# Patient Record
Sex: Male | Born: 1935 | Race: White | Hispanic: No | Marital: Married | Smoking: Former smoker
Health system: Southern US, Community
[De-identification: ages and names within clinical notes are randomized; demographics above are authoritative.]

## PROBLEM LIST (undated history)

## (undated) DIAGNOSIS — I2510 Atherosclerotic heart disease of native coronary artery without angina pectoris: Secondary | ICD-10-CM

## (undated) DIAGNOSIS — Z9889 Other specified postprocedural states: Secondary | ICD-10-CM

## (undated) DIAGNOSIS — I10 Essential (primary) hypertension: Secondary | ICD-10-CM

## (undated) DIAGNOSIS — D869 Sarcoidosis, unspecified: Secondary | ICD-10-CM

## (undated) DIAGNOSIS — G8929 Other chronic pain: Secondary | ICD-10-CM

## (undated) DIAGNOSIS — C801 Malignant (primary) neoplasm, unspecified: Secondary | ICD-10-CM

## (undated) DIAGNOSIS — F0390 Unspecified dementia without behavioral disturbance: Secondary | ICD-10-CM

## (undated) DIAGNOSIS — F29 Unspecified psychosis not due to a substance or known physiological condition: Secondary | ICD-10-CM

## (undated) DIAGNOSIS — M21379 Foot drop, unspecified foot: Secondary | ICD-10-CM

## (undated) DIAGNOSIS — G4730 Sleep apnea, unspecified: Secondary | ICD-10-CM

## (undated) DIAGNOSIS — K219 Gastro-esophageal reflux disease without esophagitis: Secondary | ICD-10-CM

## (undated) DIAGNOSIS — R112 Nausea with vomiting, unspecified: Secondary | ICD-10-CM

## (undated) DIAGNOSIS — E119 Type 2 diabetes mellitus without complications: Secondary | ICD-10-CM

## (undated) DIAGNOSIS — F429 Obsessive-compulsive disorder, unspecified: Secondary | ICD-10-CM

## (undated) DIAGNOSIS — E785 Hyperlipidemia, unspecified: Secondary | ICD-10-CM

## (undated) HISTORY — DX: Sarcoidosis, unspecified: D86.9

## (undated) HISTORY — PX: GALLBLADDER SURGERY: SHX652

## (undated) HISTORY — DX: Foot drop, unspecified foot: M21.379

## (undated) HISTORY — DX: Hyperlipidemia, unspecified: E78.5

## (undated) HISTORY — DX: Gastro-esophageal reflux disease without esophagitis: K21.9

## (undated) HISTORY — DX: Unspecified dementia, unspecified severity, without behavioral disturbance, psychotic disturbance, mood disturbance, and anxiety: F03.90

## (undated) HISTORY — PX: BACK SURGERY: SHX140

## (undated) HISTORY — DX: Unspecified psychosis not due to a substance or known physiological condition: F29

## (undated) HISTORY — DX: Obsessive-compulsive disorder, unspecified: F42.9

## (undated) HISTORY — DX: Essential (primary) hypertension: I10

## (undated) HISTORY — DX: Other chronic pain: G89.29

## (undated) HISTORY — PX: CHOLECYSTECTOMY: SHX55

## (undated) HISTORY — PX: MELANOMA EXCISION: SHX5266

## (undated) HISTORY — PX: OTHER SURGICAL HISTORY: SHX169

## (undated) HISTORY — PX: TONSILLECTOMY: SUR1361

## (undated) NOTE — *Deleted (*Deleted)
Eric Bowman    [id number]    1933-06-10  Primary Care Physician:Green, Chong Sicilian, NP  Ov 08/20/20 Dr Wynona Neat:    Chief complaint:   Patient being seen for recent chest x-Kreig findings showing large pleural effusion  HPI:  Patient with pleural effusion that required thoracentesis had over 1200 cc of amber-colored fluid drained -Fluid shows mesothelial cells, no with atypia  At the time of the pleural effusion findings, he also did have scrotal edema, lower extremity edema, BNP elevated  Recent swallowing evaluation did reveal silent aspiration  Also prior to being evaluated with the x-Matilde showing pleural effusion he was being treated with antibiotics for possible pneumonia  Multifactorial reasons for the pleural effusion  Patient had a chest x-Draylen today showing stable findings compared with 20 just had his thoracentesis performed about 2 weeks ago  Patient was recently started on apixaban empirically  Had Covid earlier in the year       Assessment:  Decompensated heart failure  Pleural effusion likely related to heart failure, may also be secondary to recent infection  Atelectasis/infection likely related to recurrent aspiration  Aspiration pneumonia  Severe deconditioning    Plan/Recommendations: Optimize diuresis  Continue treatment for obstructive sleep apnea  Regarding anticoagulation, unless there is ongoing risk may consider stopping anticoagulation depending on further evaluation which may include checking the lower extremity for clots, consideration for CT angio.  May need repeated thoracentesis  Aspiration precautions is essential to guard against recurrent lower extremity infections  Follow-up in about 6 weeks      10/01/2020  f/u ov/[name] re:  Decompensated heart failure Pleural effusion likely related to heart failure, may also be secondary to recent infection Atelectasis/infection likely related to recurrent  aspiration Aspiration pneumonia Severe deconditioning  No chief complaint on file.    Dyspnea:  *** Cough: *** Sleeping: *** SABA use: *** 02: ***   No obvious day to day or daytime variability or assoc excess/ purulent sputum or mucus plugs or hemoptysis or cp or chest tightness, subjective wheeze or overt sinus or hb symptoms.   *** without nocturnal  or early am exacerbation  of respiratory  c/o's or need for noct saba. Also denies any obvious fluctuation of symptoms with weather or environmental changes or other aggravating or alleviating factors except as outlined above   No unusual exposure hx or h/o childhood pna/ asthma or knowledge of premature birth.  Current Allergies, Complete Past Medical History, Past Surgical History, Family History, and Social History were reviewed in [employer] record.  ROS  The following are not active complaints unless bolded Hoarseness, sore throat, dysphagia, dental problems, itching, sneezing,  nasal congestion or discharge of excess mucus or purulent secretions, ear ache,   fever, chills, sweats, unintended wt loss or wt gain, classically pleuritic or exertional cp,  orthopnea pnd or arm/hand swelling  or leg swelling, presyncope, palpitations, abdominal pain, anorexia, nausea, vomiting, diarrhea  or change in bowel habits or change in bladder habits, change in stools or change in urine, dysuria, hematuria,  rash, arthralgias, visual complaints, headache, numbness, weakness or ataxia or problems with walking or coordination,  change in mood or  memory.        No outpatient medications have been marked as taking for the 10/01/20 encounter (Appointment) with [name], MD.          Past Surgical History:  Procedure Laterality Date  . BACK SURGERY  cervical neck  . CATARACT EXTRACTION W/PHACO  01/05/2012   Procedure: CATARACT EXTRACTION PHACO AND INTRAOCULAR LENS PLACEMENT (IOC);  Surgeon: Gemma Payor, MD;  Location:  [facility];  Service: Ophthalmology;  Laterality: Left;  CDE 18.47  . CATARACT EXTRACTION W/PHACO  02/02/2012   Procedure: CATARACT EXTRACTION PHACO AND INTRAOCULAR LENS PLACEMENT (IOC);  Surgeon: Gemma Payor, MD;  Location: [facility];  Service: Ophthalmology;  Laterality: Right;  CDE:15.38  . CHOLECYSTECTOMY    . hemorhoidectomy    . MELANOMA EXCISION     left side-[name]  . TONSILLECTOMY      Past Medical History:  Diagnosis Date  . Cancer (HCC)    melanoma-left side  . Chronic pain    legs and feet  . Coronary artery disease   . Dementia (HCC)   . Diabetes mellitus without complication (HCC)   . Foot drop   . GERD (gastroesophageal reflux disease)   . Gout   . Hyperlipidemia   . Hypertension   . OCD (obsessive compulsive disorder)   . PONV (postoperative nausea and vomiting)   . Psychosis (HCC)   . Sarcoidosis   . Sleep apnea    cpap

---

## 1998-07-24 ENCOUNTER — Ambulatory Visit (HOSPITAL_COMMUNITY): Admission: RE | Admit: 1998-07-24 | Discharge: 1998-07-24 | Payer: Self-pay | Admitting: *Deleted

## 1998-12-02 ENCOUNTER — Encounter: Payer: Self-pay | Admitting: Neurosurgery

## 1998-12-02 ENCOUNTER — Inpatient Hospital Stay (HOSPITAL_COMMUNITY): Admission: RE | Admit: 1998-12-02 | Discharge: 1998-12-04 | Payer: Self-pay | Admitting: Neurosurgery

## 2005-05-26 ENCOUNTER — Encounter (HOSPITAL_COMMUNITY): Admission: RE | Admit: 2005-05-26 | Discharge: 2005-05-27 | Payer: Self-pay | Admitting: *Deleted

## 2007-04-09 ENCOUNTER — Encounter (INDEPENDENT_AMBULATORY_CARE_PROVIDER_SITE_OTHER): Payer: Self-pay | Admitting: Internal Medicine

## 2007-05-02 ENCOUNTER — Ambulatory Visit: Payer: Self-pay | Admitting: Internal Medicine

## 2007-05-02 ENCOUNTER — Ambulatory Visit (HOSPITAL_COMMUNITY): Admission: RE | Admit: 2007-05-02 | Discharge: 2007-05-02 | Payer: Self-pay | Admitting: Internal Medicine

## 2007-05-02 DIAGNOSIS — I10 Essential (primary) hypertension: Secondary | ICD-10-CM

## 2007-05-02 DIAGNOSIS — K219 Gastro-esophageal reflux disease without esophagitis: Secondary | ICD-10-CM

## 2007-05-02 DIAGNOSIS — E785 Hyperlipidemia, unspecified: Secondary | ICD-10-CM | POA: Insufficient documentation

## 2007-05-02 DIAGNOSIS — K589 Irritable bowel syndrome without diarrhea: Secondary | ICD-10-CM

## 2007-05-02 DIAGNOSIS — R0602 Shortness of breath: Secondary | ICD-10-CM

## 2007-05-02 DIAGNOSIS — D869 Sarcoidosis, unspecified: Secondary | ICD-10-CM

## 2007-05-02 DIAGNOSIS — J42 Unspecified chronic bronchitis: Secondary | ICD-10-CM

## 2007-05-16 ENCOUNTER — Ambulatory Visit: Payer: Self-pay | Admitting: Internal Medicine

## 2007-05-16 DIAGNOSIS — J310 Chronic rhinitis: Secondary | ICD-10-CM

## 2007-05-23 ENCOUNTER — Telehealth (INDEPENDENT_AMBULATORY_CARE_PROVIDER_SITE_OTHER): Payer: Self-pay | Admitting: Internal Medicine

## 2007-05-30 ENCOUNTER — Encounter (INDEPENDENT_AMBULATORY_CARE_PROVIDER_SITE_OTHER): Payer: Self-pay | Admitting: Internal Medicine

## 2007-06-01 ENCOUNTER — Encounter (INDEPENDENT_AMBULATORY_CARE_PROVIDER_SITE_OTHER): Payer: Self-pay | Admitting: Internal Medicine

## 2007-06-06 ENCOUNTER — Encounter (INDEPENDENT_AMBULATORY_CARE_PROVIDER_SITE_OTHER): Payer: Self-pay | Admitting: Internal Medicine

## 2007-06-07 ENCOUNTER — Encounter (INDEPENDENT_AMBULATORY_CARE_PROVIDER_SITE_OTHER): Payer: Self-pay | Admitting: Internal Medicine

## 2007-06-11 ENCOUNTER — Encounter (INDEPENDENT_AMBULATORY_CARE_PROVIDER_SITE_OTHER): Payer: Self-pay | Admitting: Internal Medicine

## 2007-06-25 ENCOUNTER — Encounter (INDEPENDENT_AMBULATORY_CARE_PROVIDER_SITE_OTHER): Payer: Self-pay | Admitting: Internal Medicine

## 2007-06-25 ENCOUNTER — Ambulatory Visit: Admission: RE | Admit: 2007-06-25 | Discharge: 2007-06-25 | Payer: Self-pay | Admitting: Internal Medicine

## 2007-06-30 ENCOUNTER — Ambulatory Visit: Payer: Self-pay | Admitting: Pulmonary Disease

## 2007-07-23 ENCOUNTER — Ambulatory Visit: Admission: RE | Admit: 2007-07-23 | Discharge: 2007-07-23 | Payer: Self-pay | Admitting: Internal Medicine

## 2007-07-23 ENCOUNTER — Encounter (INDEPENDENT_AMBULATORY_CARE_PROVIDER_SITE_OTHER): Payer: Self-pay | Admitting: Internal Medicine

## 2007-07-30 ENCOUNTER — Telehealth (INDEPENDENT_AMBULATORY_CARE_PROVIDER_SITE_OTHER): Payer: Self-pay | Admitting: *Deleted

## 2007-07-31 ENCOUNTER — Ambulatory Visit: Payer: Self-pay | Admitting: Pulmonary Disease

## 2007-08-08 ENCOUNTER — Encounter (INDEPENDENT_AMBULATORY_CARE_PROVIDER_SITE_OTHER): Payer: Self-pay | Admitting: Internal Medicine

## 2007-08-08 ENCOUNTER — Telehealth (INDEPENDENT_AMBULATORY_CARE_PROVIDER_SITE_OTHER): Payer: Self-pay | Admitting: Internal Medicine

## 2007-08-22 ENCOUNTER — Encounter (INDEPENDENT_AMBULATORY_CARE_PROVIDER_SITE_OTHER): Payer: Self-pay | Admitting: Internal Medicine

## 2007-08-23 ENCOUNTER — Telehealth (INDEPENDENT_AMBULATORY_CARE_PROVIDER_SITE_OTHER): Payer: Self-pay | Admitting: Internal Medicine

## 2007-08-24 ENCOUNTER — Encounter (INDEPENDENT_AMBULATORY_CARE_PROVIDER_SITE_OTHER): Payer: Self-pay | Admitting: Internal Medicine

## 2007-08-31 ENCOUNTER — Telehealth (INDEPENDENT_AMBULATORY_CARE_PROVIDER_SITE_OTHER): Payer: Self-pay | Admitting: *Deleted

## 2007-09-10 ENCOUNTER — Ambulatory Visit: Payer: Self-pay | Admitting: Internal Medicine

## 2007-09-10 DIAGNOSIS — G4733 Obstructive sleep apnea (adult) (pediatric): Secondary | ICD-10-CM | POA: Insufficient documentation

## 2007-10-10 ENCOUNTER — Ambulatory Visit: Payer: Self-pay | Admitting: Internal Medicine

## 2007-10-11 ENCOUNTER — Telehealth (INDEPENDENT_AMBULATORY_CARE_PROVIDER_SITE_OTHER): Payer: Self-pay | Admitting: *Deleted

## 2007-10-11 LAB — CONVERTED CEMR LAB
ALT: 18 units/L (ref 0–53)
Albumin: 4 g/dL (ref 3.5–5.2)
Basophils Absolute: 0.1 10*3/uL (ref 0.0–0.1)
Calcium: 8.8 mg/dL (ref 8.4–10.5)
Chloride: 105 meq/L (ref 96–112)
Cholesterol: 126 mg/dL (ref 0–200)
Creatinine, Ser: 0.81 mg/dL (ref 0.40–1.50)
Glucose, Bld: 102 mg/dL — ABNORMAL HIGH (ref 70–99)
HDL: 54 mg/dL (ref >39)
Hemoglobin: 12.8 g/dL — ABNORMAL LOW (ref 13.0–17.0)
LDL Cholesterol: 60 mg/dL (ref 0–99)
Lymphocytes Relative: 15 % (ref 12–46)
Lymphs Abs: 1.2 10*3/uL (ref 0.7–4.0)
Platelets: 260 10*3/uL (ref 150–400)
Potassium: 3.8 meq/L (ref 3.5–5.3)
Sodium: 140 meq/L (ref 135–145)
Total Bilirubin: 0.5 mg/dL (ref 0.3–1.2)
Total Protein: 6.2 g/dL (ref 6.0–8.3)
Triglycerides: 60 mg/dL (ref <150)
WBC: 7.8 10*3/uL (ref 4.0–10.5)

## 2007-11-14 ENCOUNTER — Ambulatory Visit: Payer: Self-pay | Admitting: Internal Medicine

## 2007-11-21 ENCOUNTER — Encounter (INDEPENDENT_AMBULATORY_CARE_PROVIDER_SITE_OTHER): Payer: Self-pay | Admitting: Internal Medicine

## 2007-11-21 ENCOUNTER — Telehealth (INDEPENDENT_AMBULATORY_CARE_PROVIDER_SITE_OTHER): Payer: Self-pay | Admitting: *Deleted

## 2007-12-05 ENCOUNTER — Ambulatory Visit: Payer: Self-pay | Admitting: Internal Medicine

## 2007-12-06 ENCOUNTER — Encounter: Payer: Self-pay | Admitting: Cardiology

## 2007-12-06 ENCOUNTER — Telehealth (INDEPENDENT_AMBULATORY_CARE_PROVIDER_SITE_OTHER): Payer: Self-pay | Admitting: Internal Medicine

## 2008-01-14 ENCOUNTER — Ambulatory Visit: Payer: Self-pay | Admitting: Internal Medicine

## 2008-01-14 ENCOUNTER — Encounter: Payer: Self-pay | Admitting: Internal Medicine

## 2008-03-26 ENCOUNTER — Ambulatory Visit: Payer: Self-pay | Admitting: Internal Medicine

## 2008-03-28 LAB — CONVERTED CEMR LAB
BUN: 14 mg/dL (ref 6–23)
Chloride: 105 meq/L (ref 96–112)
Glucose, Bld: 104 mg/dL — ABNORMAL HIGH (ref 70–99)

## 2008-05-07 ENCOUNTER — Encounter (INDEPENDENT_AMBULATORY_CARE_PROVIDER_SITE_OTHER): Payer: Self-pay | Admitting: Internal Medicine

## 2008-05-23 ENCOUNTER — Encounter (INDEPENDENT_AMBULATORY_CARE_PROVIDER_SITE_OTHER): Payer: Self-pay | Admitting: Internal Medicine

## 2008-06-04 ENCOUNTER — Encounter (INDEPENDENT_AMBULATORY_CARE_PROVIDER_SITE_OTHER): Payer: Self-pay | Admitting: Internal Medicine

## 2008-09-01 ENCOUNTER — Ambulatory Visit: Payer: Self-pay | Admitting: Internal Medicine

## 2008-09-02 ENCOUNTER — Encounter (INDEPENDENT_AMBULATORY_CARE_PROVIDER_SITE_OTHER): Payer: Self-pay | Admitting: Internal Medicine

## 2008-09-04 ENCOUNTER — Encounter (INDEPENDENT_AMBULATORY_CARE_PROVIDER_SITE_OTHER): Payer: Self-pay | Admitting: Internal Medicine

## 2008-09-04 DIAGNOSIS — R7309 Other abnormal glucose: Secondary | ICD-10-CM

## 2008-09-04 DIAGNOSIS — D72829 Elevated white blood cell count, unspecified: Secondary | ICD-10-CM | POA: Insufficient documentation

## 2008-09-04 LAB — CONVERTED CEMR LAB
ALT: 18 units/L (ref 0–53)
BUN: 12 mg/dL (ref 6–23)
Basophils Absolute: 0.1 10*3/uL (ref 0.0–0.1)
CO2: 23 meq/L (ref 19–32)
Chloride: 101 meq/L (ref 96–112)
Eosinophils Relative: 11 % — ABNORMAL HIGH (ref 0–5)
Lymphs Abs: 2 10*3/uL (ref 0.7–4.0)
MCHC: 32.6 g/dL (ref 30.0–36.0)
MCV: 90.5 fL (ref 78.0–100.0)
Monocytes Absolute: 0.3 10*3/uL (ref 0.1–1.0)
Monocytes Relative: 5 % (ref 3–12)
Neutro Abs: 3.5 10*3/uL (ref 1.7–7.7)
Neutrophils Relative %: 53 % (ref 43–77)
Platelets: 250 10*3/uL (ref 150–400)
Potassium: 3.5 meq/L (ref 3.5–5.3)
RDW: 13.6 % (ref 11.5–15.5)
Sodium: 139 meq/L (ref 135–145)

## 2008-09-17 ENCOUNTER — Telehealth (INDEPENDENT_AMBULATORY_CARE_PROVIDER_SITE_OTHER): Payer: Self-pay | Admitting: Internal Medicine

## 2008-10-03 ENCOUNTER — Encounter (INDEPENDENT_AMBULATORY_CARE_PROVIDER_SITE_OTHER): Payer: Self-pay | Admitting: Internal Medicine

## 2008-10-08 DIAGNOSIS — D721 Eosinophilia: Secondary | ICD-10-CM

## 2008-10-08 LAB — CONVERTED CEMR LAB
Basophils Absolute: 0.2 10*3/uL — ABNORMAL HIGH (ref 0.0–0.1)
Basophils Relative: 2 % — ABNORMAL HIGH (ref 0–1)
Glucose, Bld: 88 mg/dL (ref 70–99)
HCT: 45.1 % (ref 39.0–52.0)
Hemoglobin: 14.7 g/dL (ref 13.0–17.0)
Lymphocytes Relative: 32 % (ref 12–46)
Lymphs Abs: 2.2 10*3/uL (ref 0.7–4.0)
MCV: 89.7 fL (ref 78.0–100.0)
Monocytes Relative: 8 % (ref 3–12)
Neutrophils Relative %: 50 % (ref 43–77)
RBC: 5.03 M/uL (ref 4.22–5.81)
RDW: 13.8 % (ref 11.5–15.5)

## 2008-12-23 ENCOUNTER — Encounter (INDEPENDENT_AMBULATORY_CARE_PROVIDER_SITE_OTHER): Payer: Self-pay | Admitting: Internal Medicine

## 2008-12-25 ENCOUNTER — Encounter (INDEPENDENT_AMBULATORY_CARE_PROVIDER_SITE_OTHER): Payer: Self-pay | Admitting: Internal Medicine

## 2009-03-06 ENCOUNTER — Encounter (INDEPENDENT_AMBULATORY_CARE_PROVIDER_SITE_OTHER): Payer: Self-pay | Admitting: Internal Medicine

## 2010-06-29 ENCOUNTER — Ambulatory Visit (HOSPITAL_COMMUNITY): Admission: RE | Admit: 2010-06-29 | Discharge: 2010-06-29 | Payer: Self-pay | Admitting: Pulmonary Disease

## 2010-08-03 ENCOUNTER — Ambulatory Visit (HOSPITAL_COMMUNITY): Admission: RE | Admit: 2010-08-03 | Discharge: 2010-08-03 | Payer: Self-pay | Admitting: Neurology

## 2010-08-12 ENCOUNTER — Encounter (HOSPITAL_COMMUNITY): Admission: RE | Admit: 2010-08-12 | Discharge: 2010-09-11 | Payer: Self-pay | Admitting: Neurology

## 2010-09-13 ENCOUNTER — Encounter (HOSPITAL_COMMUNITY)
Admission: RE | Admit: 2010-09-13 | Discharge: 2010-10-13 | Payer: Self-pay | Source: Home / Self Care | Attending: Neurology | Admitting: Neurology

## 2010-11-21 ENCOUNTER — Encounter: Payer: Self-pay | Admitting: *Deleted

## 2010-11-30 NOTE — Letter (Signed)
Summary: Anders Grant request  [employer] eob request   Imported By: Curtis Sites 03/10/2009 10:05:08  _____________________________________________________________________  External Attachment:    Type:   Image     Comment:   External Document

## 2011-03-15 NOTE — Procedures (Signed)
NAME:  Eric Bowman, Eric Bowman                 ACCOUNT NO.:  [id number]   MEDICAL RECORD NO.:  [id number]          PATIENT TYPE:  OUT   LOCATION:  SLEEP LAB                     FACILITY:  [facility]   PHYSICIAN:  Barbaraann Share, MD,FCCPDATE OF BIRTH:  1933-01-21   DATE OF STUDY:  07/23/2007                            NOCTURNAL POLYSOMNOGRAM   REFERRING PHYSICIAN:   REFERRING PHYSICIAN:  Dr. Erle Crocker.   INDICATION FOR STUDY:  Hypersomnia with sleep apnea.  The patient  returns for pressure optimization.   EPWORTH SCORE:  5.   SLEEP ARCHITECTURE:  The patient had total sleep time of 357 minutes  with decreased slow-wave sleep as well as REM.  Sleep onset latency was  normal, as was REM onset.  Sleep efficiency was fairly good at 90%.   RESPIRATORY DATA:  The patient was fitted with a large ComfortGel  Respironics CPAP mask and CPAP titration was initiated.  At a final  pressure of 6 cm, the patient had excellent control of both his  obstructive events and snoring even through supine REM.  Tolerance was  excellent.   OXYGEN DATA:  There was O2 desaturation as low as 86% prior to pressure  optimization.   CARDIAC DATA:  Occasional PACs were noted along with rare fusion beats.  There were no clinically significant arrhythmias noted.   MOVEMENT-PARASOMNIA:  The patient was found to have 446 leg jerks with  two per hour resulting in arousal or awakening.  These these did improve  somewhat on CPAP but continued to be an issue.   IMPRESSION/RECOMMENDATIONS:  1. Good control of previously-documented obstructive sleep apnea with      6 cm of CPAP pressure delivered by a Respironics large ComfortGel      CPAP mask.  The patient had excellent control even through supine      REM.  2. Very large numbers of leg jerks with what appears to be significant      sleep disruption despite optimal CPAP.  Clinical correlation is      suggested to exclude a concomitant primary movement disorder of  sleep.  If the patient's      clinical history is suggestive of this or if he fails to respond      optimally to CPAP, consideration could be given to a trial of a      dopamine agonist.      Barbaraann Share, MD,FCCP  Diplomate, American Board of Sleep  Medicine  Electronically Signed     [facility]/MEDQ  D:  08/02/2007 06:58:27  T:  08/02/2007 13:38:39  Job:  [id number]

## 2011-03-15 NOTE — Procedures (Signed)
NAME:  Eric Bowman, Eric Bowman                 ACCOUNT NO.:  [id number]   MEDICAL RECORD NO.:  [id number]          PATIENT TYPE:  OUT   LOCATION:  SLEEP LAB                     FACILITY:  [facility]   PHYSICIAN:  Barbaraann Share, MD,FCCPDATE OF BIRTH:  04-Mar-1933   DATE OF STUDY:  06/25/2007                            NOCTURNAL POLYSOMNOGRAM   REFERRING PHYSICIAN:  Erle Crocker, M.D.   INDICATION FOR STUDY:  Hypersomnia with sleep apnea.   EPWORTH SLEEPINESS SCORE:  6.   SLEEP ARCHITECTURE:  The patient had a total sleep time of 293 minutes  with no slow wave sleep and decreased REM.  Sleep onset latency was  normal, and REM onset was normal as well.  Sleep efficiency was  decreased at 76%.   RESPIRATORY DATA:  The patient was found to have 63 hypopneas and 4  apneas for an apnea/hypopnea index of 14 events per hour.  The events  were not positional, but there was mild to moderate snoring noted  throughout.  The patient did not meet split night criteria secondary to  most of the events occurring after 1:00 a.m.   OXYGEN DATA:  The patient had very transient desaturation as low as 78%  with the obstructive events, and had approximately 126 minutes with O2  saturations between 81 and 90%.  At 4:48 a.m., oxygen at 2 liters per  nasal cannula was added to the patient for some unknown reasons.  The  patient was actually awake during that time and was not having  obstructive events nor significant O2 desaturations.  Technician  comments were not available to explain this.   CARDIAC DATA:  Occasional PAC and PVC without clinically significant  arrhythmia.   MOVEMENT-PARASOMNIA:  The patient was found to have 320 leg jerks with  1.8 per hour resulting in arousal or awakening.   IMPRESSIONS-RECOMMENDATIONS:  1. Mild obstructive sleep apnea/hypopnea syndrome with an      apnea/hypopnea index of 14 events per hour and transient O2      desaturation as low as 78%.  The patient did not meet split  night      protocol since most of the events occurred after 1:00 a.m.[name]      Treatment for this degree of sleep apnea can include weight loss      alone if applicable, upper airway surgery, oral appliance, and also      CPAP.  2. Occasional PAC and PVC without clinically significant arrhythmia.  3. Very large numbers of leg jerks with significant sleep disruption.      It is really unclear from the study how much of this is due to the      patient's sleep disordered breathing, or if the patient may have a      concomitant primary movement disorder of sleep.  Clinical      correlation is suggested to see if there is anything to suggest the      restless leg syndrome.  If there is not, then I would focus my      attention on the sleep apnea and see if the patient  continued to have daytime symptomatology despite controlling the      sleep apnea.  If they do, then I would give him a trial of dopamine      agonist for the significant leg jerks.      Barbaraann Share, MD,FCCP  Diplomate, American Board of Sleep  Medicine  Electronically Signed     [facility]/MEDQ  D:  07/04/2007 09:15:14  T:  07/04/2007 09:40:15  Job:  [id number]

## 2011-09-13 ENCOUNTER — Encounter: Payer: Self-pay | Admitting: Cardiology

## 2011-09-13 ENCOUNTER — Ambulatory Visit (INDEPENDENT_AMBULATORY_CARE_PROVIDER_SITE_OTHER): Payer: Medicare Other | Admitting: Cardiology

## 2011-09-13 VITALS — BP 121/66 | HR 76 | Resp 16 | Ht 70.0 in | Wt 238.0 lb

## 2011-09-13 DIAGNOSIS — I2510 Atherosclerotic heart disease of native coronary artery without angina pectoris: Secondary | ICD-10-CM

## 2011-09-13 DIAGNOSIS — I10 Essential (primary) hypertension: Secondary | ICD-10-CM

## 2011-09-13 MED ORDER — NITROGLYCERIN 0.4 MG SL SUBL: 0.4000 mg | SUBLINGUAL_TABLET | SUBLINGUAL | Status: DC | PRN

## 2011-09-13 NOTE — Patient Instructions (Addendum)
Your physician has recommended you make the following change in your medication: start using Nitroglycerin for chest pain. Please follow directions on medication bottle.  Your physician recommends that you schedule a follow-up appointment in: 1 year

## 2011-09-13 NOTE — Assessment & Plan Note (Signed)
He is not having any symptoms of coronary ischemia. He has a history of remote stent but I cannot find the records. Regardless, his EKG is Normalas is his exam. His cardiac risk factors are being addressed by Dr. Juanetta Gosling. Reviewed his meds and assured he and his wife that he is taking all he needs to take. I will see him back P.r.n..

## 2011-09-13 NOTE — Progress Notes (Signed)
HPI Eric Bowman comes in today for evaluation and management of his history of coronary disease. He has not been seen by [place] in well over 3 years.  He is referred by Dr. Juanetta Gosling his primary care. He is just not been doing well with some serious problems with dementia. He is also seen neurology.  He denies any chest discomfort or angina. He has had no palpitations, presyncope or syncope. He did have a recent fall. He denies orthopnea, PND or edema. He Also denies any significant dyspnea on exertion but he does have difficulty with mobility with a brace and a cane.  Looking back to the chart, I can't find a cardiac catheterization. He did have a stress Myoview in 2006 Which demonstrated normal left ventricular function, normal perfusion, ejection fraction 61%.  EKG today is essentially normal except for some PACs. I don't have an old one to compare.  Past Medical History  Diagnosis Date  . COPD (chronic obstructive pulmonary disease)   . Dementia   . Reflux   . OCD (obsessive compulsive disorder)   . Hyperlipidemia   . Psychosis   . Hypertension   . Gout   . Chronic pain     Current Outpatient Prescriptions  Medication Sig Dispense Refill  . allopurinol (ZYLOPRIM) 300 MG tablet Take 300 mg by mouth daily.        [name] (NORVASC) 10 MG tablet Take 10 mg by mouth daily.        [name] 81 MG tablet Take 81 mg by mouth daily.        . furosemide (LASIX) 40 MG tablet Take 40 mg by mouth daily.        [name] (PRINIVIL,ZESTRIL) 20 MG tablet Take 20 mg by mouth daily.        . meloxicam (MOBIC) 7.5 MG tablet Take 7.5 mg by mouth daily.        [name] omeprazole (PRILOSEC) 20 MG capsule Take 20 mg by mouth daily.        . potassium chloride SA (K-DUR,KLOR-CON) 20 MEQ tablet Take 20 mEq by mouth daily.        . simvastatin (ZOCOR) 20 MG tablet Take 20 mg by mouth at bedtime.          Allergies  Allergen Reactions  . Penicillins   . Sulfonamide Derivatives   . Tetanus Toxoid      Family History  Problem Relation Age of Onset  . Heart disease      History   Social History  . Marital Status: Married    Spouse Name: N/A    Number of Children: N/A  . Years of Education: N/A   Occupational History  . Not on file.   Social History Main Topics  . Smoking status: Former Games developer  . Smokeless tobacco: Not on file  . Alcohol Use: No  . Drug Use: Not on file  . Sexually Active: Not on file   Other Topics Concern  . Not on file   Social History Narrative  . No narrative on file    ROS ALL NEGATIVE EXCEPT THOSE NOTED IN HPI  PE  General Appearance: well developed, well nourished in no acute distress, elderly HEENT: symmetrical face, PERRLA, good dentition, full beard  Neck: no JVD, thyromegaly, or adenopathy, trachea midline Chest: symmetric without deformity Cardiac: PMI non-displaced, RRR, normal S1, S2, no gallop or murmur Lung: clear to ausculation and percussion Vascular: all pulses full without bruits  Abdominal: nondistended,  nontender, good bowel sounds, no HSM, no bruits Extremities: no cyanosis, clubbing , 1+ edema, no sign of DVT, no varicosities  Skin: normal color, no rashes Neuro: alert and oriented x 3, non-focal Pysch: normal affect  EKG Normal sinus rhythm, left axis deviation, low voltage, PACs BMET    Component Value Date/Time   NA 139 09/02/2008 2007   K 3.5 09/02/2008 2007   CL 101 09/02/2008 2007   CO2 23 09/02/2008 2007   GLUCOSE 88 10/03/2008 2254   BUN 12 09/02/2008 2007   CREATININE 0.87 09/02/2008 2007   CALCIUM 8.9 09/02/2008 2007    Lipid Panel     Component Value Date/Time   CHOL 113 09/02/2008 2007   TRIG 104 09/02/2008 2007   HDL 40 09/02/2008 2007   CHOLHDL 2.8 Ratio 09/02/2008 2007   VLDL 21 09/02/2008 2007   LDLCALC 52 09/02/2008 2007    CBC    Component Value Date/Time   WBC 7.1 10/03/2008 2254   RBC 5.03 10/03/2008 2254   HGB 14.7 10/03/2008 2254   HCT 45.1 10/03/2008 2254   PLT 244 10/03/2008 2254    MCV 89.7 10/03/2008 2254   MCHC 32.6 10/03/2008 2254   RDW 13.8 10/03/2008 2254   LYMPHSABS 2.2 10/03/2008 2254   MONOABS 0.6 10/03/2008 2254   EOSABS 0.6 10/03/2008 2254   BASOSABS 0.2* 10/03/2008 2254

## 2011-09-14 ENCOUNTER — Encounter (INDEPENDENT_AMBULATORY_CARE_PROVIDER_SITE_OTHER): Payer: Self-pay | Admitting: Internal Medicine

## 2011-11-07 ENCOUNTER — Ambulatory Visit (INDEPENDENT_AMBULATORY_CARE_PROVIDER_SITE_OTHER): Payer: Medicare Other | Admitting: Internal Medicine

## 2012-01-02 ENCOUNTER — Encounter (HOSPITAL_COMMUNITY): Payer: Self-pay | Admitting: Pharmacy Technician

## 2012-01-03 ENCOUNTER — Encounter (HOSPITAL_COMMUNITY)
Admission: RE | Admit: 2012-01-03 | Discharge: 2012-01-03 | Disposition: A | Discharge status: Home / Self Care | Payer: Medicare Other | Source: Ambulatory Visit | Attending: Ophthalmology | Admitting: Ophthalmology

## 2012-01-03 ENCOUNTER — Encounter (HOSPITAL_COMMUNITY): Payer: Self-pay

## 2012-01-03 HISTORY — DX: Malignant (primary) neoplasm, unspecified: C80.1

## 2012-01-03 HISTORY — DX: Gastro-esophageal reflux disease without esophagitis: K21.9

## 2012-01-03 HISTORY — DX: Sleep apnea, unspecified: G47.30

## 2012-01-03 HISTORY — DX: Other specified postprocedural states: Z98.890

## 2012-01-03 HISTORY — DX: Nausea with vomiting, unspecified: R11.2

## 2012-01-03 LAB — BASIC METABOLIC PANEL
BUN: 23 mg/dL (ref 6–23)
CO2: 27 mEq/L (ref 19–32)
Chloride: 105 mEq/L (ref 96–112)
Creatinine, Ser: 0.79 mg/dL (ref 0.50–1.35)
GFR calc Af Amer: >90 mL/min (ref >90)
GFR calc non Af Amer: 85 mL/min — ABNORMAL LOW (ref >90)
Glucose, Bld: 89 mg/dL (ref 70–99)

## 2012-01-03 LAB — HEMOGLOBIN AND HEMATOCRIT, BLOOD: HCT: 44.3 % (ref 39.0–52.0)

## 2012-01-03 NOTE — Patient Instructions (Addendum)
22 Eric Bowman  01/03/2012   Your procedure is scheduled on:  01/05/2012  Report to [facility] at  820  AM.  Call this number if you have problems the morning of surgery: [phone]   Remember:   Do not eat food:After Midnight.  May have clear liquids:until Midnight .  Clear liquids include soda, tea, black coffee, apple or grape juice, broth.  Take these medicines the morning of surgery with A SIP OF WATER:  Allopurinol, norvasc,lisinopril   Do not wear jewelry, make-up or nail polish.  Do not wear lotions, powders, or perfumes. You may wear deodorant.  Do not shave 48 hours prior to surgery.  Do not bring valuables to the hospital.  Contacts, dentures or bridgework may not be worn into surgery.  Leave suitcase in the car. After surgery it may be brought to your room.  For patients admitted to the hospital, checkout time is 11:00 AM the day of discharge.   Patients discharged the day of surgery will not be allowed to drive home.  Name and phone number of your driver:  family  Special Instructions: N/A   Please read over the following fact sheets that you were given: Pain Booklet, Surgical Site Infection Prevention, Anesthesia Post-op Instructions and Care and Recovery After Surgery Cataract A cataract is a clouding of the lens of the eye. When a lens becomes cloudy, vision is reduced based on the degree and nature of the clouding. Many cataracts reduce vision to some degree. Some cataracts make people more near-sighted as they develop. Other cataracts increase glare. Cataracts that are ignored and become worse can sometimes look white. The white color can be seen through the pupil. CAUSES   Aging. However, cataracts may occur at any age, even in newborns.   Certain drugs.   Trauma to the eye.   Certain diseases such as diabetes.   Specific eye diseases such as chronic inflammation inside the eye or a sudden attack of a rare form of glaucoma.   Inherited or acquired medical  problems.  SYMPTOMS   Gradual, progressive drop in vision in the affected eye.   Severe, rapid visual loss. This most often happens when trauma is the cause.  DIAGNOSIS  To detect a cataract, an eye doctor examines the lens. Cataracts are best diagnosed with an exam of the eyes with the pupils enlarged (dilated) by drops.  TREATMENT  For an early cataract, vision may improve by using different eyeglasses or stronger lighting. If that does not help your vision, surgery is the only effective treatment. A cataract needs to be surgically removed when vision loss interferes with your everyday activities, such as driving, reading, or watching TV. A cataract may also have to be removed if it prevents examination or treatment of another eye problem. Surgery removes the cloudy lens and usually replaces it with a substitute lens (intraocular lens, IOL).  At a time when both you and your doctor agree, the cataract will be surgically removed. If you have cataracts in both eyes, only one is usually removed at a time. This allows the operated eye to heal and be out of danger from any possible problems after surgery (such as infection or poor wound healing). In rare cases, a cataract may be doing damage to your eye. In these cases, your caregiver may advise surgical removal right away. The vast majority of people who have cataract surgery have better vision afterward. HOME CARE INSTRUCTIONS  If you are not planning surgery, you  may be asked to do the following:  Use different eyeglasses.   Use stronger or brighter lighting.   Ask your eye doctor about reducing your medicine dose or changing medicines if it is thought that a medicine caused your cataract. Changing medicines does not make the cataract go away on its own.   Become familiar with your surroundings. Poor vision can lead to injury. Avoid bumping into things on the affected side. You are at a higher risk for tripping or falling.   Exercise extreme  care when driving or operating machinery.   Wear sunglasses if you are sensitive to bright light or experiencing problems with glare.  SEEK IMMEDIATE MEDICAL CARE IF:   You have a worsening or sudden vision loss.   You notice redness, swelling, or increasing pain in the eye.   You have a fever.  Document Released: 10/17/2005 Document Revised: 10/06/2011 Document Reviewed: 06/10/2011 [facility] Patient Information 2012 [place], [place].PATIENT INSTRUCTIONS POST-ANESTHESIA  IMMEDIATELY FOLLOWING SURGERY:  Do not drive or operate machinery for the first twenty four hours after surgery.  Do not make any important decisions for twenty four hours after surgery or while taking narcotic pain medications or sedatives.  If you develop intractable nausea and vomiting or a severe headache please notify your doctor immediately.  FOLLOW-UP:  Please make an appointment with your surgeon as instructed. You do not need to follow up with anesthesia unless specifically instructed to do so.  WOUND CARE INSTRUCTIONS (if applicable):  Keep a dry clean dressing on the anesthesia/puncture wound site if there is drainage.  Once the wound has quit draining you may leave it open to air.  Generally you should leave the bandage intact for twenty four hours unless there is drainage.  If the epidural site drains for more than 36-48 hours please call the anesthesia department.  QUESTIONS?:  Please feel free to call your physician or the hospital operator if you have any questions, and they will be happy to assist you.     [facility] Anesthesia Department [address] [place] [phone]

## 2012-01-04 MED ORDER — LIDOCAINE HCL (PF) 1 % IJ SOLN
INTRAMUSCULAR | Status: AC
  Filled 2012-01-04: qty 2

## 2012-01-04 MED ORDER — NEOMYCIN-POLYMYXIN-DEXAMETH 3.5-10000-0.1 OP OINT
TOPICAL_OINTMENT | OPHTHALMIC | Status: AC
  Filled 2012-01-04: qty 3.5

## 2012-01-04 MED ORDER — CYCLOPENTOLATE-PHENYLEPHRINE 0.2-1 % OP SOLN
OPHTHALMIC | Status: AC
  Administered 2012-01-05: 1 [drp] via OPHTHALMIC
  Filled 2012-01-04: qty 2

## 2012-01-04 MED ORDER — LIDOCAINE HCL 3.5 % OP GEL
OPHTHALMIC | Status: AC
  Administered 2012-01-05: 1 via OPHTHALMIC
  Filled 2012-01-04: qty 5

## 2012-01-04 MED ORDER — TETRACAINE HCL 0.5 % OP SOLN
OPHTHALMIC | Status: AC
  Administered 2012-01-05: 1 [drp] via OPHTHALMIC
  Filled 2012-01-04: qty 2

## 2012-01-05 ENCOUNTER — Ambulatory Visit (HOSPITAL_COMMUNITY)
Admission: RE | Admit: 2012-01-05 | Discharge: 2012-01-05 | Disposition: A | Discharge status: Home / Self Care | Payer: Medicare Other | Source: Ambulatory Visit | Attending: Ophthalmology | Admitting: Ophthalmology

## 2012-01-05 ENCOUNTER — Encounter (HOSPITAL_COMMUNITY)
Admission: RE | Disposition: A | Discharge status: Home / Self Care | Payer: Self-pay | Source: Ambulatory Visit | Attending: Ophthalmology

## 2012-01-05 ENCOUNTER — Encounter (HOSPITAL_COMMUNITY): Payer: Self-pay | Admitting: *Deleted

## 2012-01-05 ENCOUNTER — Encounter (HOSPITAL_COMMUNITY): Payer: Self-pay | Admitting: Anesthesiology

## 2012-01-05 ENCOUNTER — Ambulatory Visit (HOSPITAL_COMMUNITY): Payer: Medicare Other | Admitting: Anesthesiology

## 2012-01-05 DIAGNOSIS — I10 Essential (primary) hypertension: Secondary | ICD-10-CM | POA: Insufficient documentation

## 2012-01-05 DIAGNOSIS — J449 Chronic obstructive pulmonary disease, unspecified: Secondary | ICD-10-CM | POA: Insufficient documentation

## 2012-01-05 DIAGNOSIS — H2510 Age-related nuclear cataract, unspecified eye: Secondary | ICD-10-CM | POA: Insufficient documentation

## 2012-01-05 DIAGNOSIS — J4489 Other specified chronic obstructive pulmonary disease: Secondary | ICD-10-CM | POA: Insufficient documentation

## 2012-01-05 DIAGNOSIS — H268 Other specified cataract: Secondary | ICD-10-CM | POA: Insufficient documentation

## 2012-01-05 DIAGNOSIS — Z01812 Encounter for preprocedural laboratory examination: Secondary | ICD-10-CM | POA: Insufficient documentation

## 2012-01-05 DIAGNOSIS — Z79899 Other long term (current) drug therapy: Secondary | ICD-10-CM | POA: Insufficient documentation

## 2012-01-05 HISTORY — PX: CATARACT EXTRACTION W/PHACO: SHX586

## 2012-01-05 SURGERY — PHACOEMULSIFICATION, CATARACT, WITH IOL INSERTION
Anesthesia: Monitor Anesthesia Care | Site: Eye | Laterality: Left | Wound class: Clean

## 2012-01-05 MED ORDER — MIDAZOLAM HCL 2 MG/2ML IJ SOLN: 1.0000 mg | INTRAMUSCULAR | Status: DC | PRN

## 2012-01-05 MED ORDER — CYCLOPENTOLATE-PHENYLEPHRINE 0.2-1 % OP SOLN
1.0000 [drp] | OPHTHALMIC | Status: AC
  Administered 2012-01-05 (×3): 1 [drp] via OPHTHALMIC

## 2012-01-05 MED ORDER — PHENYLEPHRINE HCL 2.5 % OP SOLN
OPHTHALMIC | Status: AC
  Administered 2012-01-05: 1 [drp] via OPHTHALMIC
  Filled 2012-01-05: qty 2

## 2012-01-05 MED ORDER — PHENYLEPHRINE HCL 2.5 % OP SOLN
1.0000 [drp] | OPHTHALMIC | Status: AC
  Administered 2012-01-05 (×3): 1 [drp] via OPHTHALMIC

## 2012-01-05 MED ORDER — EPINEPHRINE HCL 1 MG/ML IJ SOLN
INTRAMUSCULAR | Status: DC | PRN
  Administered 2012-01-05: 10:00:00

## 2012-01-05 MED ORDER — PROVISC 10 MG/ML IO SOLN
INTRAOCULAR | Status: DC | PRN
  Administered 2012-01-05: 8.5 mg via INTRAOCULAR

## 2012-01-05 MED ORDER — LIDOCAINE HCL (PF) 1 % IJ SOLN
INTRAMUSCULAR | Status: DC | PRN
  Administered 2012-01-05: .4 mL

## 2012-01-05 MED ORDER — LACTATED RINGERS IV SOLN
INTRAVENOUS | Status: DC
  Administered 2012-01-05: 10:00:00 via INTRAVENOUS

## 2012-01-05 MED ORDER — BSS IO SOLN
INTRAOCULAR | Status: DC | PRN
  Administered 2012-01-05: 15 mL via INTRAOCULAR

## 2012-01-05 MED ORDER — MIDAZOLAM HCL 2 MG/2ML IJ SOLN
INTRAMUSCULAR | Status: AC
  Filled 2012-01-05: qty 2

## 2012-01-05 MED ORDER — MIDAZOLAM HCL 5 MG/5ML IJ SOLN
INTRAMUSCULAR | Status: DC | PRN
  Administered 2012-01-05 (×2): 1 mg via INTRAVENOUS

## 2012-01-05 MED ORDER — ONDANSETRON HCL 4 MG/2ML IJ SOLN: 4.0000 mg | Freq: Once | INTRAMUSCULAR | Status: DC | PRN

## 2012-01-05 MED ORDER — EPINEPHRINE HCL 1 MG/ML IJ SOLN
INTRAMUSCULAR | Status: AC
  Filled 2012-01-05: qty 1

## 2012-01-05 MED ORDER — POVIDONE-IODINE 5 % OP SOLN
OPHTHALMIC | Status: DC | PRN
  Administered 2012-01-05: 1 via OPHTHALMIC

## 2012-01-05 MED ORDER — FENTANYL CITRATE 0.05 MG/ML IJ SOLN: 25.0000 ug | INTRAMUSCULAR | Status: DC | PRN

## 2012-01-05 MED ORDER — NEOMYCIN-POLYMYXIN-DEXAMETH 0.1 % OP OINT
TOPICAL_OINTMENT | OPHTHALMIC | Status: DC | PRN
  Administered 2012-01-05: 1 via OPHTHALMIC

## 2012-01-05 MED ORDER — LIDOCAINE 3.5 % OP GEL OPTIME - NO CHARGE
OPHTHALMIC | Status: DC | PRN
  Administered 2012-01-05: 2 [drp] via OPHTHALMIC

## 2012-01-05 MED ORDER — TETRACAINE HCL 0.5 % OP SOLN
1.0000 [drp] | OPHTHALMIC | Status: AC
  Administered 2012-01-05 (×3): 1 [drp] via OPHTHALMIC

## 2012-01-05 MED ORDER — LIDOCAINE HCL 3.5 % OP GEL
1.0000 "application " | Freq: Once | OPHTHALMIC | Status: AC
  Administered 2012-01-05: 1 via OPHTHALMIC

## 2012-01-05 SURGICAL SUPPLY — 32 items

## 2012-01-05 NOTE — Brief Op Note (Signed)
Pre-Op Dx: Cataract OS Post-Op Dx: Cataract OS Surgeon: Carder Yin Anesthesia: Topical with MAC Implant: B&L enVista Specimen: None Complications: None 

## 2012-01-05 NOTE — Anesthesia Preprocedure Evaluation (Signed)
Anesthesia Evaluation  Patient identified by MRN, date of birth, ID band Patient awake    Reviewed: Allergy & Precautions, H&P , NPO status , Patient's Chart, lab work & pertinent test results  History of Anesthesia Complications (+) PONV  Airway Mallampati: III      Dental  (+) Edentulous Upper and Edentulous Lower   Pulmonary shortness of breath, sleep apnea and Continuous Positive Airway Pressure Ventilation , COPD breath sounds clear to auscultation        Cardiovascular hypertension, Pt. on medications + CAD Rhythm:Regular Rate:Normal     Neuro/Psych PSYCHIATRIC DISORDERS (hx of OCD)    GI/Hepatic GERD-  Medicated and Controlled,  Endo/Other    Renal/GU      Musculoskeletal   Abdominal   Peds  Hematology   Anesthesia Other Findings   Reproductive/Obstetrics                           Anesthesia Physical Anesthesia Plan  ASA: III  Anesthesia Plan: MAC   Post-op Pain Management:    Induction: Intravenous  Airway Management Planned: Nasal Cannula  Additional Equipment:   Intra-op Plan:   Post-operative Plan:   Informed Consent: I have reviewed the patients History and Physical, chart, labs and discussed the procedure including the risks, benefits and alternatives for the proposed anesthesia with the patient or authorized representative who has indicated his/her understanding and acceptance.     Plan Discussed with:   Anesthesia Plan Comments:         Anesthesia Quick Evaluation

## 2012-01-05 NOTE — Anesthesia Procedure Notes (Signed)
Procedure Name: MAC Date/Time: 01/05/2012 9:40 AM Performed by: Minerva Areola Pre-anesthesia Checklist: Patient identified, Emergency Drugs available, Suction available, Timeout performed and Patient being monitored Patient Re-evaluated:Patient Re-evaluated prior to inductionOxygen Delivery Method: Nasal Cannula

## 2012-01-05 NOTE — Transfer of Care (Signed)
Immediate Anesthesia Transfer of Care Note  Patient: Eric Bowman  Procedure(s) Performed: Procedure(s) (LRB): CATARACT EXTRACTION PHACO AND INTRAOCULAR LENS PLACEMENT (IOC) (Left)  Patient Location: Shortstay  Anesthesia Type: MAC  Level of Consciousness: awake  Airway & Oxygen Therapy: Patient Spontanous Breathing   Post-op Assessment: Report given to PACU RN, Post -op Vital signs reviewed and stable and Patient moving all extremities  Post vital signs: Reviewed and stable  Complications: No apparent anesthesia complications

## 2012-01-05 NOTE — H&P (Signed)
I have reviewed the H&P, the patient was re-examined, and I have identified no interval changes in medical condition and plan of care since the history and physical of record  

## 2012-01-05 NOTE — Anesthesia Postprocedure Evaluation (Signed)
  Anesthesia Post-op Note  Patient: Eric Bowman  Procedure(s) Performed: Procedure(s) (LRB): CATARACT EXTRACTION PHACO AND INTRAOCULAR LENS PLACEMENT (IOC) (Left)  Patient Location:  Short Stay  Anesthesia Type: MAC  Level of Consciousness: awake  Airway and Oxygen Therapy: Patient Spontanous Breathing  Post-op Pain: none  Post-op Assessment: Post-op Vital signs reviewed, Patient's Cardiovascular Status Stable, Respiratory Function Stable, Patent Airway, No signs of Nausea or vomiting and Pain level controlled  Post-op Vital Signs: Reviewed and stable  Complications: No apparent anesthesia complications

## 2012-01-05 NOTE — Discharge Instructions (Signed)
MARKEISE MATHEWS  01/05/2012     Instructions  1. Use medications as Instructed.  Shake well before use. Wait 5 minutes between drops.  {OPHTHALMIC ANTIBIOTICS:22167} 4 times a day x 1 week.  {OPHTHALMIC ANTI-INFLAMMATORY:22168} 2 times a day x 4 weeks.  {OPHTHALMIC STEROID:22169} 4 times a day - week 1   3 times a day - Week 2, 2 times a day- Week 3, 1 time a day - Week 4.  2. Do not rub the operative eye. Do not swim underwater for 2 weeks.  3. You may remove the clear shield and resume your normal activities the day after  Surgery. Your eyes may feel more comfortable if you wear dark glasses outside.  4. Call our office at [phone] if you have sudden change in vision, extreme redness or pain. Some fluctuation in vision is normal after surgery. If you have an emergency after hours, call Dr. Alto Denver at [phone].  5. It is important that you attend all of your follow-up appointments.        Follow-up:{follow up:32580} with Gemma Payor, MD.   Dr. Lahoma Crocker: [phone]  Dr. Lita Mains: [phone]  Dr. Alto Denver: [phone]   If you find that you cannot contact your physician, but feel that your signs and   Symptoms warrant a physician's attention, call the Emergency Room at   [phone] [phone].   Other{NA AND WUXLKGMW:10272}.

## 2012-01-06 NOTE — Op Note (Signed)
NAME:  AXXEL, GUDE                 ACCOUNT NO.:  [id number]  MEDICAL RECORD NO.:  [id number]  LOCATION:  [facility]                          FACILITY:  [facility]  PHYSICIAN:  Susanne Greenhouse, MD       DATE OF BIRTH:  04-08-1933  DATE OF PROCEDURE:  01/05/2012 DATE OF DISCHARGE:  01/05/2012                              OPERATIVE REPORT   PREOPERATIVE DIAGNOSIS:  Nuclear cataract, left eye, diagnosis code 366.16.  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  OPERATION PERFORMED:  Phacoemulsification with posterior chamber intraocular lens implantation, left eye.  SURGEON:  Susanne Greenhouse, MD  ANESTHESIA:  General endotracheal anesthesia.  OPERATIVE SUMMARY:  In the preoperative area, dilating drops were placed into the left eye.  The patient was then brought into the operating room where he was placed under general anesthesia.  The eye was then prepped and draped.  Beginning with a #75 blade, a paracentesis port was made at the surgeon's 2 o'clock position.  The anterior chamber was then filled with a 1% nonpreserved lidocaine solution with epinephrine.  This was followed by Viscoat to deepen the chamber.  A small fornix-based peritomy was performed superiorly.  Next, a single iris hook was placed through the limbus superiorly.  A 2.4-mm keratome blade was then used to make a clear corneal incision over the iris hook.  A bent cystotome needle and Utrata forceps were used to create a continuous tear capsulotomy.  Hydrodissection was performed using balanced salt solution on a fine cannula.  The lens nucleus was then removed using phacoemulsification in a quadrant cracking technique.  The cortical material was then removed with irrigation and aspiration.  The capsular bag and anterior chamber were refilled with Provisc.  The wound was widened to approximately 3 mm and a posterior chamber intraocular  lens was placed into the capsular bag without difficulty using an [employer] lens injecting system.  A single 10-0 nylon suture was then used to close the incision as well as stromal hydration.  The Provisc was removed from the anterior chamber and capsular bag with irrigation and aspiration.  At this point, the wounds were tested for leak, which were negative.  The anterior chamber remained deep and stable.  The patient tolerated the procedure well.  There were no operative complications, and he awoke from general anesthesia without problem.  No surgical specimens.  Prosthetic device used is a Actuary, EnVista posterior chamber lens, model MX60, power of 15.0, serial number is [id number].          ______________________________ Susanne Greenhouse, MD     KEH/MEDQ  D:  01/05/2012  T:  01/06/2012  Job:  [id number]

## 2012-01-09 ENCOUNTER — Encounter (HOSPITAL_COMMUNITY): Payer: Self-pay | Admitting: Ophthalmology

## 2012-01-23 ENCOUNTER — Encounter (HOSPITAL_COMMUNITY): Payer: Self-pay | Admitting: Pharmacy Technician

## 2012-01-25 ENCOUNTER — Encounter (HOSPITAL_COMMUNITY): Admission: RE | Admit: 2012-01-25 | Discharge: 2012-01-25 | Payer: Medicare Other | Source: Ambulatory Visit

## 2012-02-01 MED ORDER — LIDOCAINE HCL 3.5 % OP GEL
OPHTHALMIC | Status: AC
  Filled 2012-02-01: qty 5

## 2012-02-01 MED ORDER — LIDOCAINE HCL (PF) 1 % IJ SOLN
INTRAMUSCULAR | Status: AC
  Filled 2012-02-01: qty 2

## 2012-02-01 MED ORDER — TETRACAINE HCL 0.5 % OP SOLN
OPHTHALMIC | Status: AC
  Filled 2012-02-01: qty 2

## 2012-02-01 MED ORDER — NEOMYCIN-POLYMYXIN-DEXAMETH 3.5-10000-0.1 OP OINT
TOPICAL_OINTMENT | OPHTHALMIC | Status: AC
  Filled 2012-02-01: qty 3.5

## 2012-02-01 MED ORDER — CYCLOPENTOLATE-PHENYLEPHRINE 0.2-1 % OP SOLN
OPHTHALMIC | Status: AC
  Filled 2012-02-01: qty 2

## 2012-02-02 ENCOUNTER — Ambulatory Visit (HOSPITAL_COMMUNITY)
Admission: RE | Admit: 2012-02-02 | Discharge: 2012-02-02 | Disposition: A | Discharge status: Home / Self Care | Payer: Medicare Other | Source: Ambulatory Visit | Attending: Ophthalmology | Admitting: Ophthalmology

## 2012-02-02 ENCOUNTER — Encounter (HOSPITAL_COMMUNITY): Payer: Self-pay | Admitting: Anesthesiology

## 2012-02-02 ENCOUNTER — Ambulatory Visit (HOSPITAL_COMMUNITY): Payer: Medicare Other | Admitting: Anesthesiology

## 2012-02-02 ENCOUNTER — Encounter (HOSPITAL_COMMUNITY)
Admission: RE | Disposition: A | Discharge status: Home / Self Care | Payer: Self-pay | Source: Ambulatory Visit | Attending: Ophthalmology

## 2012-02-02 ENCOUNTER — Encounter (HOSPITAL_COMMUNITY): Payer: Self-pay | Admitting: *Deleted

## 2012-02-02 DIAGNOSIS — J449 Chronic obstructive pulmonary disease, unspecified: Secondary | ICD-10-CM | POA: Insufficient documentation

## 2012-02-02 DIAGNOSIS — J4489 Other specified chronic obstructive pulmonary disease: Secondary | ICD-10-CM | POA: Insufficient documentation

## 2012-02-02 DIAGNOSIS — Z79899 Other long term (current) drug therapy: Secondary | ICD-10-CM | POA: Insufficient documentation

## 2012-02-02 DIAGNOSIS — I10 Essential (primary) hypertension: Secondary | ICD-10-CM | POA: Insufficient documentation

## 2012-02-02 DIAGNOSIS — H2510 Age-related nuclear cataract, unspecified eye: Secondary | ICD-10-CM | POA: Insufficient documentation

## 2012-02-02 HISTORY — PX: CATARACT EXTRACTION W/PHACO: SHX586

## 2012-02-02 SURGERY — PHACOEMULSIFICATION, CATARACT, WITH IOL INSERTION
Anesthesia: Monitor Anesthesia Care | Site: Eye | Laterality: Right | Wound class: Clean

## 2012-02-02 MED ORDER — ONDANSETRON HCL 4 MG/2ML IJ SOLN: 4.0000 mg | Freq: Once | INTRAMUSCULAR | Status: DC | PRN

## 2012-02-02 MED ORDER — FENTANYL CITRATE 0.05 MG/ML IJ SOLN: 25.0000 ug | INTRAMUSCULAR | Status: DC | PRN

## 2012-02-02 MED ORDER — PROVISC 10 MG/ML IO SOLN
INTRAOCULAR | Status: DC | PRN
  Administered 2012-02-02: 8.5 mg via INTRAOCULAR

## 2012-02-02 MED ORDER — LIDOCAINE HCL 3.5 % OP GEL: 1.0000 "application " | Freq: Once | OPHTHALMIC | Status: DC

## 2012-02-02 MED ORDER — PHENYLEPHRINE HCL 2.5 % OP SOLN
OPHTHALMIC | Status: AC
  Filled 2012-02-02: qty 2

## 2012-02-02 MED ORDER — MIDAZOLAM HCL 2 MG/2ML IJ SOLN
INTRAMUSCULAR | Status: AC
  Filled 2012-02-02: qty 2

## 2012-02-02 MED ORDER — ONDANSETRON HCL 4 MG/2ML IJ SOLN
INTRAMUSCULAR | Status: AC
  Filled 2012-02-02: qty 2

## 2012-02-02 MED ORDER — BSS IO SOLN
INTRAOCULAR | Status: DC | PRN
  Administered 2012-02-02: 15 mL via INTRAOCULAR

## 2012-02-02 MED ORDER — TETRACAINE HCL 0.5 % OP SOLN
1.0000 [drp] | OPHTHALMIC | Status: AC
  Administered 2012-02-02 (×3): 1 [drp] via OPHTHALMIC

## 2012-02-02 MED ORDER — PHENYLEPHRINE HCL 2.5 % OP SOLN
1.0000 [drp] | OPHTHALMIC | Status: AC
  Administered 2012-02-02 (×3): 1 [drp] via OPHTHALMIC

## 2012-02-02 MED ORDER — EPINEPHRINE HCL 1 MG/ML IJ SOLN
INTRAOCULAR | Status: DC | PRN
  Administered 2012-02-02: 10:00:00

## 2012-02-02 MED ORDER — EPINEPHRINE HCL 1 MG/ML IJ SOLN
INTRAMUSCULAR | Status: AC
  Filled 2012-02-02: qty 1

## 2012-02-02 MED ORDER — MIDAZOLAM HCL 2 MG/2ML IJ SOLN
1.0000 mg | INTRAMUSCULAR | Status: DC | PRN
  Administered 2012-02-02: 2 mg via INTRAVENOUS

## 2012-02-02 MED ORDER — POVIDONE-IODINE 5 % OP SOLN
OPHTHALMIC | Status: DC | PRN
  Administered 2012-02-02: 1 via OPHTHALMIC

## 2012-02-02 MED ORDER — LACTATED RINGERS IV SOLN
INTRAVENOUS | Status: DC
  Administered 2012-02-02: 10:00:00 via INTRAVENOUS

## 2012-02-02 MED ORDER — NEOMYCIN-POLYMYXIN-DEXAMETH 0.1 % OP OINT
TOPICAL_OINTMENT | OPHTHALMIC | Status: DC | PRN
  Administered 2012-02-02: 1 via OPHTHALMIC

## 2012-02-02 MED ORDER — LIDOCAINE 3.5 % OP GEL OPTIME - NO CHARGE
OPHTHALMIC | Status: DC | PRN
  Administered 2012-02-02: 1 [drp] via OPHTHALMIC

## 2012-02-02 MED ORDER — CARBACHOL 0.01 % IO SOLN
INTRAOCULAR | Status: AC
  Filled 2012-02-02: qty 1.5

## 2012-02-02 MED ORDER — LIDOCAINE HCL (PF) 1 % IJ SOLN
INTRAMUSCULAR | Status: DC | PRN
  Administered 2012-02-02: .4 mL

## 2012-02-02 MED ORDER — CYCLOPENTOLATE-PHENYLEPHRINE 0.2-1 % OP SOLN
1.0000 [drp] | OPHTHALMIC | Status: AC
Start: 2012-02-02 — End: 2012-02-02
  Administered 2012-02-02 (×3): 1 [drp] via OPHTHALMIC

## 2012-02-02 MED ORDER — ONDANSETRON HCL 4 MG/2ML IJ SOLN
4.0000 mg | Freq: Once | INTRAMUSCULAR | Status: AC
  Administered 2012-02-02: 4 mg via INTRAVENOUS

## 2012-02-02 SURGICAL SUPPLY — 31 items
CAPSULAR TENSION RING-AMO (OPHTHALMIC RELATED) IMPLANT
CLOTH BEACON ORANGE TIMEOUT ST (SAFETY) ×2 IMPLANT
EYE SHIELD UNIVERSAL CLEAR (GAUZE/BANDAGES/DRESSINGS) ×4 IMPLANT
GLOVE BIO SURGEON STRL SZ 6.5 (GLOVE) IMPLANT
GLOVE BIOGEL PI IND STRL 6.5 (GLOVE) ×2 IMPLANT
GLOVE BIOGEL PI IND STRL 7.0 (GLOVE) IMPLANT
GLOVE BIOGEL PI IND STRL 7.5 (GLOVE) IMPLANT
GLOVE BIOGEL PI INDICATOR 6.5 (GLOVE) ×2
GLOVE BIOGEL PI INDICATOR 7.0 (GLOVE)
GLOVE BIOGEL PI INDICATOR 7.5 (GLOVE)
GLOVE ECLIPSE 6.5 STRL STRAW (GLOVE) IMPLANT
GLOVE ECLIPSE 7.0 STRL STRAW (GLOVE) IMPLANT
GLOVE ECLIPSE 7.5 STRL STRAW (GLOVE) IMPLANT
GLOVE EXAM NITRILE LRG STRL (GLOVE) IMPLANT
GLOVE EXAM NITRILE MD LF STRL (GLOVE) ×2 IMPLANT
GLOVE SKINSENSE NS SZ6.5 (GLOVE)
GLOVE SKINSENSE NS SZ7.0 (GLOVE)
GLOVE SKINSENSE STRL SZ6.5 (GLOVE) IMPLANT
GLOVE SKINSENSE STRL SZ7.0 (GLOVE) IMPLANT
KIT VITRECTOMY (OPHTHALMIC RELATED) IMPLANT
PAD ARMBOARD 7.5X6 YLW CONV (MISCELLANEOUS) ×2 IMPLANT
PROC W NO LENS (INTRAOCULAR LENS)
PROC W SPEC LENS (INTRAOCULAR LENS)
PROCESS W NO LENS (INTRAOCULAR LENS) IMPLANT
PROCESS W SPEC LENS (INTRAOCULAR LENS) IMPLANT
RING MALYGIN (MISCELLANEOUS) IMPLANT
SIGHTPATH CAT PROC W REG LENS (Ophthalmic Related) ×2 IMPLANT
SYR TB 1ML LL NO SAFETY (SYRINGE) ×2 IMPLANT
TAPE PAPER MEDFIX 1IN X 10YD (GAUZE/BANDAGES/DRESSINGS) ×2 IMPLANT
VISCOELASTIC ADDITIONAL (OPHTHALMIC RELATED) IMPLANT
WATER STERILE IRR 250ML POUR (IV SOLUTION) ×2 IMPLANT

## 2012-02-02 NOTE — H&P (Signed)
I have reviewed the H&P, the patient was re-examined, and I have identified no interval changes in medical condition and plan of care since the history and physical of record  

## 2012-02-02 NOTE — Transfer of Care (Signed)
Immediate Anesthesia Transfer of Care Note  Patient: Eric Bowman  Procedure(s) Performed: Procedure(s) (LRB): CATARACT EXTRACTION PHACO AND INTRAOCULAR LENS PLACEMENT (IOC) (Right)  Patient Location: PACU and Short Stay  Anesthesia Type: MAC  Level of Consciousness: awake  Airway & Oxygen Therapy: Patient Spontanous Breathing  Post-op Assessment: Report given to PACU RN  Post vital signs: Reviewed and stable  Complications: No apparent anesthesia complications

## 2012-02-02 NOTE — Anesthesia Preprocedure Evaluation (Signed)
Anesthesia Evaluation  Patient identified by MRN, date of birth, ID band Patient awake    Reviewed: Allergy & Precautions, H&P , NPO status , Patient's Chart, lab work & pertinent test results  History of Anesthesia Complications (+) PONV  Airway Mallampati: III      Dental  (+) Edentulous Upper and Edentulous Lower   Pulmonary shortness of breath, sleep apnea and Continuous Positive Airway Pressure Ventilation , COPD breath sounds clear to auscultation        Cardiovascular hypertension, Pt. on medications + CAD Rhythm:Regular Rate:Normal     Neuro/Psych PSYCHIATRIC DISORDERS (hx of OCD)    GI/Hepatic GERD-  Medicated and Controlled,  Endo/Other    Renal/GU      Musculoskeletal   Abdominal   Peds  Hematology   Anesthesia Other Findings   Reproductive/Obstetrics                           Anesthesia Physical Anesthesia Plan  ASA: III  Anesthesia Plan: MAC   Post-op Pain Management:    Induction: Intravenous  Airway Management Planned: Nasal Cannula  Additional Equipment:   Intra-op Plan:   Post-operative Plan:   Informed Consent: I have reviewed the patients History and Physical, chart, labs and discussed the procedure including the risks, benefits and alternatives for the proposed anesthesia with the patient or authorized representative who has indicated his/her understanding and acceptance.     Plan Discussed with:   Anesthesia Plan Comments:         Anesthesia Quick Evaluation

## 2012-02-02 NOTE — Discharge Instructions (Signed)
Eric Bowman  02/02/2012     Instructions  1. Use medications as Instructed.  Shake well before use. Wait 5 minutes between drops.  {OPHTHALMIC ANTIBIOTICS:22167} 4 times a day x 1 week.  {OPHTHALMIC ANTI-INFLAMMATORY:22168} 2 times a day x 4 weeks.  {OPHTHALMIC STEROID:22169} 4 times a day - week 1   3 times a day - Week 2, 2 times a day- Week 3, 1 time a day - Week 4.  2. Do not rub the operative eye. Do not swim underwater for 2 weeks.  3. You may remove the clear shield and resume your normal activities the day after  Surgery. Your eyes may feel more comfortable if you wear dark glasses outside.  4. Call our office at [phone] if you have sudden change in vision, extreme redness or pain. Some fluctuation in vision is normal after surgery. If you have an emergency after hours, call Dr. Alto Denver at [phone].  5. It is important that you attend all of your follow-up appointments.        Follow-up:{follow up:32580} with Gemma Payor, MD.   Dr. Lahoma Crocker: [phone]  Dr. Lita Mains: [phone]  Dr. Alto Denver: [phone]   If you find that you cannot contact your physician, but feel that your signs and   Symptoms warrant a physician's attention, call the Emergency Room at   [phone] ext.[phone].   Other{NA AND WUXLKGMW:10272}.

## 2012-02-02 NOTE — Brief Op Note (Signed)
Pre-Op Dx: Cataract OD Post-Op Dx: Cataract OD Surgeon: Tressy Kunzman Anesthesia: Topical with MAC Implant: B&L enVista Specimen: None Complications: None 

## 2012-02-02 NOTE — Anesthesia Postprocedure Evaluation (Signed)
  Anesthesia Post-op Note  Patient: Eric Bowman  Procedure(s) Performed: Procedure(s) (LRB): CATARACT EXTRACTION PHACO AND INTRAOCULAR LENS PLACEMENT (IOC) (Right)  Patient Location: PACU and Short Stay  Anesthesia Type: MAC  Level of Consciousness: awake, alert  and oriented  Airway and Oxygen Therapy: Patient Spontanous Breathing  Post-op Pain: none  Post-op Assessment: Post-op Vital signs reviewed, Patient's Cardiovascular Status Stable, Respiratory Function Stable, Patent Airway and No signs of Nausea or vomiting  Post-op Vital Signs: Reviewed and stable  Complications: No apparent anesthesia complications

## 2012-02-03 NOTE — Op Note (Signed)
NAME:  Eric Bowman, Eric Bowman                 ACCOUNT NO.:  [id number]  MEDICAL RECORD NO.:  [id number]  LOCATION:  [facility]                          FACILITY:  [facility]  PHYSICIAN:  Susanne Greenhouse, MD       DATE OF BIRTH:  08-Mar-1933  DATE OF PROCEDURE:  02/02/2012 DATE OF DISCHARGE:  02/02/2012                              OPERATIVE REPORT   PREOPERATIVE DIAGNOSIS:  Nuclear cataract, right eye, diagnosis code 366.16.  POSTOPERATIVE DIAGNOSIS:  Nuclear cataract, right eye, diagnosis code 366.16.  OPERATION PERFORMED:  Phacoemulsification with posterior chamber intraocular lens implantation, right eye.  SURGEON:  Bonne Dolores. Hester Joslin, MD  ANESTHESIA:  General endotracheal anesthesia.  OPERATIVE SUMMARY:  In the preoperative area, dilating drops were placed into the right eye.  The patient was then brought into the operating room where he was placed under general anesthesia.  The eye was then prepped and draped.  Beginning with a 75 blade, a paracentesis port was made at the surgeon's 2 o'clock position.  The anterior chamber was then filled with a 1% nonpreserved lidocaine solution with epinephrine.  This was followed by Viscoat to deepen the chamber.  A small fornix-based peritomy was performed superiorly.  Next, a single iris hook was placed through the limbus superiorly.  A 2.4-mm keratome blade was then used to make a clear corneal incision over the iris hook.  A bent cystotome needle and Utrata forceps were used to create a continuous tear capsulotomy.  Hydrodissection was performed using balanced salt solution on a fine cannula.  The lens nucleus was then removed using phacoemulsification in a quadrant cracking technique.  The cortical material was then removed with irrigation and aspiration.  The capsular bag and anterior chamber were refilled with Provisc.  The wound was widened to approximately 3 mm and a posterior chamber intraocular lens was placed into the capsular bag without difficulty  using an [employer] lens injecting system.  A single 10-0 nylon suture was then used to close the incision as well as stromal hydration.  The Provisc was removed from the anterior chamber and capsular bag with irrigation and aspiration.  At this point, the wounds were tested for leak, which were negative.  The anterior chamber remained deep and stable.  The patient tolerated the procedure well.  There were no operative complications, and he awoke from general anesthesia without problem.  No surgical specimens.  Prosthetic device used is a Cabin crew posterior chamber lens, model MX60, power of 13.0, serial number is [id number].          ______________________________ Susanne Greenhouse, MD     KEH/MEDQ  D:  02/02/2012  T:  02/03/2012  Job:  [id number]

## 2012-02-06 ENCOUNTER — Encounter (HOSPITAL_COMMUNITY): Payer: Self-pay | Admitting: Ophthalmology

## 2012-12-17 ENCOUNTER — Other Ambulatory Visit (HOSPITAL_COMMUNITY): Payer: Self-pay | Admitting: Pulmonary Disease

## 2012-12-17 ENCOUNTER — Ambulatory Visit (HOSPITAL_COMMUNITY)
Admission: RE | Admit: 2012-12-17 | Discharge: 2012-12-17 | Disposition: A | Discharge status: Home / Self Care | Payer: Medicare Other | Source: Ambulatory Visit | Attending: Pulmonary Disease | Admitting: Pulmonary Disease

## 2012-12-17 DIAGNOSIS — R52 Pain, unspecified: Secondary | ICD-10-CM

## 2012-12-17 DIAGNOSIS — M25569 Pain in unspecified knee: Secondary | ICD-10-CM | POA: Insufficient documentation

## 2013-01-03 ENCOUNTER — Ambulatory Visit (INDEPENDENT_AMBULATORY_CARE_PROVIDER_SITE_OTHER): Payer: Medicare Other | Admitting: Orthopedic Surgery

## 2013-01-03 ENCOUNTER — Encounter: Payer: Self-pay | Admitting: Orthopedic Surgery

## 2013-01-03 VITALS — BP 160/78 | Ht 70.0 in | Wt 256.0 lb

## 2013-01-03 DIAGNOSIS — M1712 Unilateral primary osteoarthritis, left knee: Secondary | ICD-10-CM | POA: Insufficient documentation

## 2013-01-03 DIAGNOSIS — M5136 Other intervertebral disc degeneration, lumbar region: Secondary | ICD-10-CM | POA: Insufficient documentation

## 2013-01-03 DIAGNOSIS — M51379 Other intervertebral disc degeneration, lumbosacral region without mention of lumbar back pain or lower extremity pain: Secondary | ICD-10-CM

## 2013-01-03 DIAGNOSIS — M5137 Other intervertebral disc degeneration, lumbosacral region: Secondary | ICD-10-CM

## 2013-01-03 DIAGNOSIS — R29898 Other symptoms and signs involving the musculoskeletal system: Secondary | ICD-10-CM

## 2013-01-03 DIAGNOSIS — M1710 Unilateral primary osteoarthritis, unspecified knee: Secondary | ICD-10-CM

## 2013-01-03 NOTE — Patient Instructions (Addendum)
Home exercises and knee brace

## 2013-01-03 NOTE — Progress Notes (Signed)
Patient ID: Eric Bowman, male   DOB: [birth year], 79 y.o.   MRN: [id number] Chief Complaint  Patient presents with  . Knee Pain    Left knee pain, no injury, consult Dr. Juanetta Gosling    History 79 year-old male with fairly significant medical history and a right foot drop history of degenerative disc disease, history of cervical spine fusion who presents primarily for her left leg giving out. Although he says he does have pain in his knee he notes that this is mainly when he falls. At rest he does not have pain with weight-bearing has mild pain. He says that his knee will just give out from under him he says the right knee we'll do a tube of the left knee is worse. This seems to have been controlled with an over-the-counter knee brace. He also underwent several sessions of physical therapy to improve his gait. His wife says he was not very compliant with his home exercise program following this and that it really didn't help.  He denies back pain at this time he denies radicular symptoms at this time.  Review of systems he does complain of fatigue and watering of the eyes. He complains of heart palpitations and snoring. He complains of heartburn and constipation since his hemorrhoid surgery. He complains of unsteady gait easy bleeding and excessive thirst. He denies frequency and urgency poor healing or adverse food reaction and seasonal allergy  Past Medical History  Diagnosis Date  . COPD (chronic obstructive pulmonary disease)   . Dementia   . Reflux   . OCD (obsessive compulsive disorder)   . Hyperlipidemia   . Psychosis   . Hypertension   . Gout   . Chronic pain     legs and feet  . GERD (gastroesophageal reflux disease)   . PONV (postoperative nausea and vomiting)   . Cancer     melanoma-left side  . Sleep apnea     cpap  . Sarcoidosis   . Foot drop   . Blockage of coronary artery of heart     Past Surgical History  Procedure Laterality Date  . Cholecystectomy    .  Hemorhoidectomy    . Tonsillectomy    . Melanoma excision      left side-[name]  . Back surgery      cervical neck  . Cataract extraction w/phaco  01/05/2012    Procedure: CATARACT EXTRACTION PHACO AND INTRAOCULAR LENS PLACEMENT (IOC);  Surgeon: Gemma Payor, MD;  Location: [facility];  Service: Ophthalmology;  Laterality: Left;  CDE 18.47  . Cataract extraction w/phaco  02/02/2012    Procedure: CATARACT EXTRACTION PHACO AND INTRAOCULAR LENS PLACEMENT (IOC);  Surgeon: Gemma Payor, MD;  Location: [facility];  Service: Ophthalmology;  Laterality: Right;  CDE:15.38    BP 160/78  Ht 5\' 10"  (1.778 m)  Wt 256 lb (116.121 kg)  BMI 36.73 kg/m2 Physical Exam(12)  Vital signs: As recorded  GENERAL: normal development , BMI is elevated no gross deformities he does have a brace on his right foot for drop foot  CDV: pulses are normal , minimal peripheral edema  Skin: normal x4 extremities  Lymph: nodes were not palpable/normal in the lower extremities  Psychiatric: awake, alert and oriented, with normal mood  Neuro: normal sensation  MSK  Gait: Altered gait pattern primarily secondary to chronic osteoarthritis varus deformity of the knee bilaterally and associated footdrop with brace 1 Inspection left knee mild effusion medial lateral joint line tenderness 2 Range of Motion  flexion 120 3 Motor he has weakness in his hip flexors and also in his quadriceps to manual muscle testing 4 Stability joint stability normal  Imaging imaging review included films from his cervical spine as well as his lumbar spine he has severe spinal stenotic type changes and degenerative disc disease  Has chondrocalcinosis in his knee with osteoarthritis  Assessment: Primarily his problem is giving way of the left knee and this is unlikely secondary to osteoarthritis with no instability and more likely to be primarily muscle weakness and/or spinal stenosis    Plan: We discussed a physical therapy program but says he's  been to 1 he'll likely be more compliant with home program. We would also encourage him to continue knee brace.

## 2014-03-25 ENCOUNTER — Emergency Department (HOSPITAL_COMMUNITY)
Admission: EM | Admit: 2014-03-25 | Discharge: 2014-03-25 | Disposition: A | Discharge status: Home / Self Care | Payer: Medicare Other | Attending: Emergency Medicine | Admitting: Emergency Medicine

## 2014-03-25 ENCOUNTER — Encounter (HOSPITAL_COMMUNITY): Payer: Self-pay | Admitting: Emergency Medicine

## 2014-03-25 DIAGNOSIS — Z85820 Personal history of malignant melanoma of skin: Secondary | ICD-10-CM | POA: Insufficient documentation

## 2014-03-25 DIAGNOSIS — S71109A Unspecified open wound, unspecified thigh, initial encounter: Principal | ICD-10-CM | POA: Insufficient documentation

## 2014-03-25 DIAGNOSIS — Y9389 Activity, other specified: Secondary | ICD-10-CM | POA: Insufficient documentation

## 2014-03-25 DIAGNOSIS — R296 Repeated falls: Secondary | ICD-10-CM | POA: Insufficient documentation

## 2014-03-25 DIAGNOSIS — Z859 Personal history of malignant neoplasm, unspecified: Secondary | ICD-10-CM | POA: Insufficient documentation

## 2014-03-25 DIAGNOSIS — Z880 Allergy status to penicillin: Secondary | ICD-10-CM | POA: Insufficient documentation

## 2014-03-25 DIAGNOSIS — M109 Gout, unspecified: Secondary | ICD-10-CM | POA: Insufficient documentation

## 2014-03-25 DIAGNOSIS — E119 Type 2 diabetes mellitus without complications: Secondary | ICD-10-CM | POA: Insufficient documentation

## 2014-03-25 DIAGNOSIS — I2510 Atherosclerotic heart disease of native coronary artery without angina pectoris: Secondary | ICD-10-CM | POA: Insufficient documentation

## 2014-03-25 DIAGNOSIS — Z791 Long term (current) use of non-steroidal anti-inflammatories (NSAID): Secondary | ICD-10-CM | POA: Insufficient documentation

## 2014-03-25 DIAGNOSIS — E785 Hyperlipidemia, unspecified: Secondary | ICD-10-CM | POA: Insufficient documentation

## 2014-03-25 DIAGNOSIS — F0390 Unspecified dementia without behavioral disturbance: Secondary | ICD-10-CM | POA: Insufficient documentation

## 2014-03-25 DIAGNOSIS — G8929 Other chronic pain: Secondary | ICD-10-CM | POA: Insufficient documentation

## 2014-03-25 DIAGNOSIS — Y92009 Unspecified place in unspecified non-institutional (private) residence as the place of occurrence of the external cause: Secondary | ICD-10-CM | POA: Insufficient documentation

## 2014-03-25 DIAGNOSIS — K219 Gastro-esophageal reflux disease without esophagitis: Secondary | ICD-10-CM | POA: Insufficient documentation

## 2014-03-25 DIAGNOSIS — I10 Essential (primary) hypertension: Secondary | ICD-10-CM | POA: Insufficient documentation

## 2014-03-25 DIAGNOSIS — Z7982 Long term (current) use of aspirin: Secondary | ICD-10-CM | POA: Insufficient documentation

## 2014-03-25 DIAGNOSIS — Z87891 Personal history of nicotine dependence: Secondary | ICD-10-CM | POA: Insufficient documentation

## 2014-03-25 DIAGNOSIS — Z79899 Other long term (current) drug therapy: Secondary | ICD-10-CM | POA: Insufficient documentation

## 2014-03-25 DIAGNOSIS — S71119A Laceration without foreign body, unspecified thigh, initial encounter: Secondary | ICD-10-CM

## 2014-03-25 DIAGNOSIS — S71009A Unspecified open wound, unspecified hip, initial encounter: Secondary | ICD-10-CM | POA: Insufficient documentation

## 2014-03-25 HISTORY — DX: Type 2 diabetes mellitus without complications: E11.9

## 2014-03-25 HISTORY — DX: Atherosclerotic heart disease of native coronary artery without angina pectoris: I25.10

## 2014-03-25 MED ORDER — LIDOCAINE HCL (PF) 1 % IJ SOLN
INTRAMUSCULAR | Status: AC
  Administered 2014-03-25: 22:00:00
  Filled 2014-03-25: qty 5

## 2014-03-25 MED ORDER — HYDROCODONE-ACETAMINOPHEN 5-325 MG PO TABS: ORAL_TABLET | ORAL | Status: DC

## 2014-03-25 NOTE — ED Notes (Signed)
Pt was going in house and fell and injured right leg behind thigh.

## 2014-03-25 NOTE — ED Notes (Signed)
Pt fell , has lac to lt post thigh.  T Triplett in suturing, Alert, NAD

## 2014-03-25 NOTE — ED Notes (Signed)
Lac to rt thigh, pt was outside and fell, cutting his thigh,  , does not know what  Injured him ,

## 2014-03-25 NOTE — ED Provider Notes (Signed)
CSN: [id number]     Arrival date & time 03/25/14  2025 History   First MD Initiated Contact with Patient 03/25/14 2138     Chief Complaint  Patient presents with  . Leg Injury     (Consider location/radiation/quality/duration/timing/severity/associated sxs/prior Treatment) Patient is a 80 y.o. male presenting with skin laceration. The history is provided by the patient.  Laceration Location:  Leg Leg laceration location:  R upper leg Length (cm):  3 Depth:  Through underlying tissue Quality: straight   Bleeding: controlled   Laceration mechanism:  Blunt object Pain details:    Quality:  Aching   Severity:  Mild   Timing:  Constant   Progression:  Unchanged Foreign body present:  No foreign bodies Relieved by:  Nothing Worsened by:  Nothing tried Ineffective treatments:  None tried Tetanus status:  Up to date  Patient reports laceration to his posterior right thigh after a mechanical fall into a bush outside his house.  Pt reports hx of right foot drop secondary to back surgery "years ago" and reports that he has hx of  a unsteady gait despite wearing a foot brace.  He denies sx's prior to the fall, neck pain, back pain, head injury or LOC.     Past Medical History  Diagnosis Date  . Dementia   . Reflux   . OCD (obsessive compulsive disorder)   . Hyperlipidemia   . Psychosis   . Hypertension   . Gout   . Chronic pain     legs and feet  . GERD (gastroesophageal reflux disease)   . PONV (postoperative nausea and vomiting)   . Cancer     melanoma-left side  . Sleep apnea     cpap  . Sarcoidosis   . Foot drop   . Coronary artery disease   . Diabetes mellitus without complication    Past Surgical History  Procedure Laterality Date  . Cholecystectomy    . Hemorhoidectomy    . Tonsillectomy    . Melanoma excision      left side-[name]  . Back surgery      cervical neck  . Cataract extraction w/phaco  01/05/2012    Procedure: CATARACT EXTRACTION PHACO AND  INTRAOCULAR LENS PLACEMENT (IOC);  Surgeon: [name] Branch, MD;  Location: [facility];  Service: Ophthalmology;  Laterality: Left;  CDE 18.47  . Cataract extraction w/phaco  02/02/2012    Procedure: CATARACT EXTRACTION PHACO AND INTRAOCULAR LENS PLACEMENT (IOC);  Surgeon: [name] Branch, MD;  Location: [facility];  Service: Ophthalmology;  Laterality: Right;  CDE:15.38  . Gallbladder surgery     Family History  Problem Relation Age of Onset  . Heart disease    . Anesthesia problems Neg Hx   . Hypotension Neg Hx   . Malignant hyperthermia Neg Hx   . Pseudochol deficiency Neg Hx   . Arthritis    . Cancer     History  Substance Use Topics  . Smoking status: Former Smoker -- 0.25 packs/day for 20 years    Types: Pipe    Quit date: 01/02/1974  . Smokeless tobacco: Not on file  . Alcohol Use: No    Review of Systems  Constitutional: Negative for fever and chills.  Eyes: Negative for visual disturbance.  Respiratory: Negative for shortness of breath.   Cardiovascular: Negative for chest pain.  Gastrointestinal: Negative for nausea, vomiting and abdominal pain.  Genitourinary: Negative for hematuria and flank pain.  Musculoskeletal: Negative for arthralgias, back pain, joint swelling and  neck pain.  Skin: Positive for wound.       Laceration   Neurological: Negative for dizziness, syncope, facial asymmetry, speech difficulty, weakness, numbness and headaches.  Hematological: Does not bruise/bleed easily.  Psychiatric/Behavioral: Negative for confusion.  All other systems reviewed and are negative.     Allergies  Penicillins; Sulfonamide derivatives; and Tetanus toxoid  Home Medications   Prior to Admission medications   Medication Sig Start Date End Date Taking? Authorizing Provider  acetaminophen (TYLENOL) 500 MG tablet Take 500-1,000 mg by mouth every 6 (six) hours as needed. For pain    Historical Provider, MD  allopurinol (ZYLOPRIM) 300 MG tablet Take 300 mg by mouth daily.       Historical Provider, MD  amLODipine (NORVASC) 10 MG tablet Take 10 mg by mouth daily.      Historical Provider, MD  aspirin EC 81 MG tablet Take 81 mg by mouth daily.    Historical Provider, MD  donepezil (ARICEPT) 10 MG tablet Take 10 mg by mouth daily. 02/19/14   Historical Provider, MD  furosemide (LASIX) 40 MG tablet Take 40 mg by mouth daily.      Historical Provider, MD  HYDROcodone-acetaminophen (NORCO/VICODIN) 5-325 MG per tablet Take one-two tabs po q 4-6 hrs prn pain 03/25/14   Abbygael Curtiss L. Brett Darko, PA-C  lisinopril (PRINIVIL,ZESTRIL) 20 MG tablet Take 20 mg by mouth daily.      Historical Provider, MD  meloxicam (MOBIC) 7.5 MG tablet Take 7.5 mg by mouth daily.      Historical Provider, MD  Multiple Vitamin (MULITIVITAMIN WITH MINERALS) TABS Take 1 tablet by mouth daily.    Historical Provider, MD  nitroGLYCERIN (NITROSTAT) 0.4 MG SL tablet Place 1 tablet (0.4 mg total) under the tongue every 5 (five) minutes as needed for chest pain. 09/13/11 09/12/12  Renella Cunas, MD  omeprazole (PRILOSEC) 20 MG capsule Take 20 mg by mouth daily.      Historical Provider, MD  potassium chloride SA (K-DUR,KLOR-CON) 20 MEQ tablet Take 20 mEq by mouth daily.      Historical Provider, MD  simvastatin (ZOCOR) 20 MG tablet Take 20 mg by mouth at bedtime.      Historical Provider, MD   BP 121/52  Pulse 81  Temp(Src) 98.4 F (36.9 C)  Resp 18  Ht 5\' 10"  (1.778 m)  Wt 245 lb (111.131 kg)  BMI 35.15 kg/m2  SpO2 98% Physical Exam  Nursing note and vitals reviewed. Constitutional: He is oriented to person, place, and time. He appears well-developed and well-nourished. No distress.  HENT:  Head: Normocephalic and atraumatic.  Eyes: Conjunctivae and EOM are normal. Pupils are equal, round, and reactive to light.  Neck: Normal range of motion. Neck supple.  Cardiovascular: Normal rate, regular rhythm, normal heart sounds and intact distal pulses.   No murmur heard. Pulmonary/Chest: Effort normal and breath  sounds normal. No respiratory distress. He exhibits no tenderness.  Musculoskeletal: He exhibits no edema and no tenderness.  Pt has full ROM of the bilateral LE's.  Wearing a brace to right foot secondary to hx of foot drop from previous back surgery.    Lymphadenopathy:    He has no cervical adenopathy.  Neurological: He is alert and oriented to person, place, and time. He exhibits normal muscle tone. Coordination normal.  Skin: Skin is warm. Laceration noted.  Laceration to the posterior right upper leg.  Bleeding controlled.  No edema.  No muscle tenderness on exam.  DP pulse brisk, sensation intact.  ED Course  Procedures (including critical care time) Labs Review Labs Reviewed - No data to display  Imaging Review No results found.   EKG Interpretation None      LACERATION REPAIR Performed by: Aislinn Feliz L. Aron Inge Authorized by: Emberlynn Riggan L. Delance Weide Consent: Verbal consent obtained. Risks and benefits: risks, benefits and alternatives were discussed Consent given by: patient Patient identity confirmed: provided demographic data Prepped and Draped in normal sterile fashion Wound explored  Laceration Location: right posterior thigh Laceration Length: 3 cm  No Foreign Bodies seen or palpated  Anesthesia: local infiltration  Local anesthetic: lidocaine 1% w/o epinephrine  Anesthetic total: 4 ml  Irrigation method: syringe Amount of cleaning: standard  Subcutaneous:  2 sutures of 5-0 vicryl, simple interrupted  Skin closure: staples Number of staples: 7 Technique: stapling Patient tolerance: Patient tolerated the procedure well with no immediate complications.   MDM   Final diagnoses:  Laceration of thigh without complication   Pt is alert, well appearing,  Ambulatory with foot brace on.  Td is UTD.  Pt agrees to keep wound clean, staples out in 10 days and to return here for any signs of infection.  # 6 vicodin dispensed.  He appears stable for d/c and agrees  to plan.       Andrue Dini L. Vanessa [place], PA-C 03/26/14 2321

## 2014-03-25 NOTE — Discharge Instructions (Signed)
Laceration Care, Adult °A laceration is a cut that goes through all layers of the skin. The cut goes into the tissue beneath the skin. °HOME CARE °For stitches (sutures) or staples: °· Keep the cut clean and dry. °· If you have a bandage (dressing), change it at least once a day. Change the bandage if it gets wet or dirty, or as told by your doctor. °· Wash the cut with soap and water 2 times a day. Rinse the cut with water. Pat it dry with a clean towel. °· Put a thin layer of medicated cream on the cut as told by your doctor. °· You may shower after the first 24 hours. Do not soak the cut in water until the stitches are removed. °· Only take medicines as told by your doctor. °· Have your stitches or staples removed as told by your doctor. °For skin adhesive strips: °· Keep the cut clean and dry. °· Do not get the strips wet. You may take a bath, but be careful to keep the cut dry. °· If the cut gets wet, pat it dry with a clean towel. °· The strips will fall off on their own. Do not remove the strips that are still stuck to the cut. °For wound glue: °· You may shower or take baths. Do not soak or scrub the cut. Do not swim. Avoid heavy sweating until the glue falls off on its own. After a shower or bath, pat the cut dry with a clean towel. °· Do not put medicine on your cut until the glue falls off. °· If you have a bandage, do not put tape over the glue. °· Avoid lots of sunlight or tanning lamps until the glue falls off. Put sunscreen on the cut for the first year to reduce your scar. °· The glue will fall off on its own. Do not pick at the glue. °You may need a tetanus shot if: °· You cannot remember when you had your last tetanus shot. °· You have never had a tetanus shot. °If you need a tetanus shot and you choose not to have one, you may get tetanus. Sickness from tetanus can be serious. °GET HELP RIGHT AWAY IF:  °· Your pain does not get better with medicine. °· Your arm, hand, leg, or foot loses feeling  (numbness) or changes color. °· Your cut is bleeding. °· Your joint feels weak, or you cannot use your joint. °· You have painful lumps on your body. °· Your cut is red, puffy (swollen), or painful. °· You have a red line on the skin near the cut. °· You have yellowish-white fluid (pus) coming from the cut. °· You have a fever. °· You have a bad smell coming from the cut or bandage. °· Your cut breaks open before or after stitches are removed. °· You notice something coming out of the cut, such as wood or glass. °· You cannot move a finger or toe. °MAKE SURE YOU:  °· Understand these instructions. °· Will watch your condition. °· Will get help right away if you are not doing well or get worse. °Document Released: 04/04/2008 Document Revised: 01/09/2012 Document Reviewed: 04/12/2011 °Exit[employer]® Patient Information ©2014 [employer]. ° ° ° ° °

## 2014-03-27 NOTE — ED Provider Notes (Signed)
Medical screening examination/treatment/procedure(s) were performed by non-physician practitioner and as supervising physician I was immediately available for consultation/collaboration.   EKG Interpretation None       Richarda Blade, MD 03/27/14 1351

## 2014-04-08 MED FILL — Hydrocodone-Acetaminophen Tab 5-325 MG: ORAL | Qty: 6 | Status: AC

## 2014-11-26 DIAGNOSIS — M21371 Foot drop, right foot: Secondary | ICD-10-CM | POA: Diagnosis not present

## 2014-12-10 DIAGNOSIS — L03031 Cellulitis of right toe: Secondary | ICD-10-CM | POA: Diagnosis not present

## 2014-12-10 DIAGNOSIS — I10 Essential (primary) hypertension: Secondary | ICD-10-CM | POA: Diagnosis not present

## 2015-01-21 DIAGNOSIS — I10 Essential (primary) hypertension: Secondary | ICD-10-CM | POA: Diagnosis not present

## 2015-01-21 DIAGNOSIS — J209 Acute bronchitis, unspecified: Secondary | ICD-10-CM | POA: Diagnosis not present

## 2015-01-21 DIAGNOSIS — J449 Chronic obstructive pulmonary disease, unspecified: Secondary | ICD-10-CM | POA: Diagnosis not present

## 2015-01-21 DIAGNOSIS — M545 Low back pain: Secondary | ICD-10-CM | POA: Diagnosis not present

## 2015-03-04 DIAGNOSIS — I10 Essential (primary) hypertension: Secondary | ICD-10-CM | POA: Diagnosis not present

## 2015-03-04 DIAGNOSIS — J449 Chronic obstructive pulmonary disease, unspecified: Secondary | ICD-10-CM | POA: Diagnosis not present

## 2015-03-04 DIAGNOSIS — M1990 Unspecified osteoarthritis, unspecified site: Secondary | ICD-10-CM | POA: Diagnosis not present

## 2015-03-04 DIAGNOSIS — L03031 Cellulitis of right toe: Secondary | ICD-10-CM | POA: Diagnosis not present

## 2015-04-28 DIAGNOSIS — Z0000 Encounter for general adult medical examination without abnormal findings: Secondary | ICD-10-CM | POA: Diagnosis not present

## 2015-04-28 DIAGNOSIS — Z1211 Encounter for screening for malignant neoplasm of colon: Secondary | ICD-10-CM | POA: Diagnosis not present

## 2015-05-18 DIAGNOSIS — Z85828 Personal history of other malignant neoplasm of skin: Secondary | ICD-10-CM | POA: Diagnosis not present

## 2015-05-18 DIAGNOSIS — L570 Actinic keratosis: Secondary | ICD-10-CM | POA: Diagnosis not present

## 2015-05-18 DIAGNOSIS — L0100 Impetigo, unspecified: Secondary | ICD-10-CM | POA: Diagnosis not present

## 2015-07-20 DIAGNOSIS — K219 Gastro-esophageal reflux disease without esophagitis: Secondary | ICD-10-CM | POA: Diagnosis not present

## 2015-07-20 DIAGNOSIS — I10 Essential (primary) hypertension: Secondary | ICD-10-CM | POA: Diagnosis not present

## 2015-07-20 DIAGNOSIS — Z23 Encounter for immunization: Secondary | ICD-10-CM | POA: Diagnosis not present

## 2015-07-20 DIAGNOSIS — J449 Chronic obstructive pulmonary disease, unspecified: Secondary | ICD-10-CM | POA: Diagnosis not present

## 2015-07-20 DIAGNOSIS — M1990 Unspecified osteoarthritis, unspecified site: Secondary | ICD-10-CM | POA: Diagnosis not present

## 2015-08-27 DIAGNOSIS — D485 Neoplasm of uncertain behavior of skin: Secondary | ICD-10-CM | POA: Diagnosis not present

## 2015-08-27 DIAGNOSIS — L859 Epidermal thickening, unspecified: Secondary | ICD-10-CM | POA: Diagnosis not present

## 2015-08-27 DIAGNOSIS — L0100 Impetigo, unspecified: Secondary | ICD-10-CM | POA: Diagnosis not present

## 2015-08-31 DIAGNOSIS — L0100 Impetigo, unspecified: Secondary | ICD-10-CM | POA: Diagnosis not present

## 2015-09-22 DIAGNOSIS — L0100 Impetigo, unspecified: Secondary | ICD-10-CM | POA: Diagnosis not present

## 2015-10-19 DIAGNOSIS — J449 Chronic obstructive pulmonary disease, unspecified: Secondary | ICD-10-CM | POA: Diagnosis not present

## 2015-10-19 DIAGNOSIS — F422 Mixed obsessional thoughts and acts: Secondary | ICD-10-CM | POA: Diagnosis not present

## 2015-10-19 DIAGNOSIS — I10 Essential (primary) hypertension: Secondary | ICD-10-CM | POA: Diagnosis not present

## 2015-10-19 DIAGNOSIS — G4730 Sleep apnea, unspecified: Secondary | ICD-10-CM | POA: Diagnosis not present

## 2015-10-22 DIAGNOSIS — L89310 Pressure ulcer of right buttock, unstageable: Secondary | ICD-10-CM | POA: Diagnosis not present

## 2015-10-22 DIAGNOSIS — L0100 Impetigo, unspecified: Secondary | ICD-10-CM | POA: Diagnosis not present

## 2015-11-18 DIAGNOSIS — Z85828 Personal history of other malignant neoplasm of skin: Secondary | ICD-10-CM | POA: Diagnosis not present

## 2015-11-18 DIAGNOSIS — L259 Unspecified contact dermatitis, unspecified cause: Secondary | ICD-10-CM | POA: Diagnosis not present

## 2016-01-18 DIAGNOSIS — I10 Essential (primary) hypertension: Secondary | ICD-10-CM | POA: Diagnosis not present

## 2016-01-18 DIAGNOSIS — M1990 Unspecified osteoarthritis, unspecified site: Secondary | ICD-10-CM | POA: Diagnosis not present

## 2016-01-18 DIAGNOSIS — J449 Chronic obstructive pulmonary disease, unspecified: Secondary | ICD-10-CM | POA: Diagnosis not present

## 2016-01-25 ENCOUNTER — Ambulatory Visit (INDEPENDENT_AMBULATORY_CARE_PROVIDER_SITE_OTHER): Payer: Medicare Other

## 2016-01-25 ENCOUNTER — Encounter: Payer: Self-pay | Admitting: Orthopedic Surgery

## 2016-01-25 ENCOUNTER — Ambulatory Visit (INDEPENDENT_AMBULATORY_CARE_PROVIDER_SITE_OTHER): Payer: Medicare Other | Admitting: Orthopedic Surgery

## 2016-01-25 VITALS — BP 125/66 | Ht 70.0 in | Wt 250.0 lb

## 2016-01-25 DIAGNOSIS — M1712 Unilateral primary osteoarthritis, left knee: Secondary | ICD-10-CM

## 2016-01-25 DIAGNOSIS — M25562 Pain in left knee: Secondary | ICD-10-CM

## 2016-01-25 DIAGNOSIS — B91 Sequelae of poliomyelitis: Secondary | ICD-10-CM

## 2016-01-25 DIAGNOSIS — M89661 Osteopathy after poliomyelitis, right lower leg: Secondary | ICD-10-CM | POA: Diagnosis not present

## 2016-01-25 DIAGNOSIS — M4806 Spinal stenosis, lumbar region: Secondary | ICD-10-CM

## 2016-01-25 DIAGNOSIS — M48062 Spinal stenosis, lumbar region with neurogenic claudication: Secondary | ICD-10-CM

## 2016-01-25 NOTE — Progress Notes (Signed)
Chief Complaint  Patient presents with  . Knee Pain    recurring left knee pain, last treated 3/243   HPI 82 year old male presents for reevaluation of his ongoing giving out symptoms of his left knee. When I saw him last he had a stable knee with severe varus osteoarthritis and some symptoms and signs of spinal stenosis.  He presents back for reevaluation of the giving way symptoms in his left knee complaining of mild pain medial compartment with frequent giving way episodes. No swelling. Symptoms have been ongoing for several years and are worse when he's trying to walk. He requires a walker for all weightbearing.  Review of Systems  Constitutional: Negative for fever and chills.  Musculoskeletal: Positive for back pain.  Neurological: Positive for focal weakness. Negative for tingling.    Past Medical History  Diagnosis Date  . Dementia   . Reflux   . OCD (obsessive compulsive disorder)   . Hyperlipidemia   . Psychosis   . Hypertension   . Gout   . Chronic pain     legs and feet  . GERD (gastroesophageal reflux disease)   . PONV (postoperative nausea and vomiting)   . Cancer (HCC)     melanoma-left side  . Sleep apnea     cpap  . Sarcoidosis ([place])   . Foot drop   . Coronary artery disease   . Diabetes mellitus without complication [facility])     Past Surgical History  Procedure Laterality Date  . Cholecystectomy    . Hemorhoidectomy    . Tonsillectomy    . Melanoma excision      left side-[name]  . Back surgery      cervical neck  . Cataract extraction w/phaco  01/05/2012    Procedure: CATARACT EXTRACTION PHACO AND INTRAOCULAR LENS PLACEMENT (IOC);  Surgeon: [name] Branch, MD;  Location: [facility];  Service: Ophthalmology;  Laterality: Left;  CDE 18.47  . Cataract extraction w/phaco  02/02/2012    Procedure: CATARACT EXTRACTION PHACO AND INTRAOCULAR LENS PLACEMENT (IOC);  Surgeon: [name] Branch, MD;  Location: [facility];  Service: Ophthalmology;  Laterality: Right;  CDE:15.38  .  Gallbladder surgery     Family History  Problem Relation Age of Onset  . Heart disease    . Anesthesia problems Neg Hx   . Hypotension Neg Hx   . Malignant hyperthermia Neg Hx   . Pseudochol deficiency Neg Hx   . Arthritis    . Cancer     Social History  Substance Use Topics  . Smoking status: Former Smoker -- 0.25 packs/day for 20 years    Types: Pipe    Quit date: 01/02/1974  . Smokeless tobacco: None  . Alcohol Use: No    Current outpatient prescriptions:  .  acetaminophen (TYLENOL) 500 MG tablet, Take 500-1,000 mg by mouth every 6 (six) hours as needed. For pain, Disp: , Rfl:  .  allopurinol (ZYLOPRIM) 300 MG tablet, Take 300 mg by mouth daily.  , Disp: , Rfl:  .  amLODipine (NORVASC) 10 MG tablet, Take 10 mg by mouth daily.  , Disp: , Rfl:  .  aspirin EC 81 MG tablet, Take 81 mg by mouth daily., Disp: , Rfl:  .  donepezil (ARICEPT) 10 MG tablet, Take 10 mg by mouth daily., Disp: , Rfl:  .  furosemide (LASIX) 40 MG tablet, Take 40 mg by mouth daily.  , Disp: , Rfl:  .  HYDROcodone-acetaminophen (NORCO/VICODIN) 5-325 MG per tablet, Take one-two tabs po  q 4-6 hrs prn pain, Disp: 6 tablet, Rfl: 0 .  lisinopril (PRINIVIL,ZESTRIL) 20 MG tablet, Take 20 mg by mouth daily.  , Disp: , Rfl:  .  meloxicam (MOBIC) 7.5 MG tablet, Take 7.5 mg by mouth daily.  , Disp: , Rfl:  .  Multiple Vitamin (MULITIVITAMIN WITH MINERALS) TABS, Take 1 tablet by mouth daily., Disp: , Rfl:  .  omeprazole (PRILOSEC) 20 MG capsule, Take 20 mg by mouth daily.  , Disp: , Rfl:  .  potassium chloride SA (K-DUR,KLOR-CON) 20 MEQ tablet, Take 20 mEq by mouth daily.  , Disp: , Rfl:  .  simvastatin (ZOCOR) 20 MG tablet, Take 20 mg by mouth at bedtime.  , Disp: , Rfl:  .  nitroGLYCERIN (NITROSTAT) 0.4 MG SL tablet, Place 1 tablet (0.4 mg total) under the tongue every 5 (five) minutes as needed for chest pain., Disp: 25 tablet, Rfl: 3  BP 125/66 mmHg  Ht 5\' 10"  (1.778 m)  Wt 250 lb (113.399 kg)  BMI 35.87  kg/m2  Physical Exam  Constitutional: He is oriented to person, place, and time. He appears well-developed and well-nourished. No distress.  Cardiovascular: Intact distal pulses.   Musculoskeletal:       Left knee: He exhibits no effusion.  Right leg shows significant signs and symptoms of polio with atrophy, weakness, skin however is normal no peripheral edema.  His gait is supported by a walker he flexes the spine significantly. He has a brace on his right leg to support the foot. The knee and ankle are stable.  Neurological: He is alert and oriented to person, place, and time. He displays abnormal reflex. He exhibits abnormal muscle tone. Coordination normal.  Skin: Skin is warm and dry. No rash noted. He is not diaphoretic. No erythema. No pallor.  Psychiatric: He has a normal mood and affect. His behavior is normal. Judgment and thought content normal.    Left Knee Exam   Tenderness  The patient is experiencing tenderness in the medial joint line.  Range of Motion  Extension: -5  Flexion: 110   Muscle Strength   The patient has normal left knee strength.  Tests  McMurray:  Medial - negative  Drawer:       Anterior - negative     Posterior - negative Varus: negative Valgus: negative  Other  Erythema: absent Scars: absent Sensation: normal Pulse: present Swelling: none Effusion: no effusion present    Varus thrust also noted on ambulation   ASSESSMENT: My personal interpretation of the images:  Knee x-Brody show severe varus osteoarthritis.    PLAN The giving way symptoms could be caused by spinal stenosis. We've recommended that he make an appointment to see [place] neurosurgeon for evaluation and treatment.  As far as his knee goes she does have a knee brace is using a walker we encouraged him to continue with that.  There is some concern about his aortic stenosis which was treated with a stent and his COPD which he still has trouble with although is not on  oxygen and we would recommend that if he wants to have knee surgery which he does not that we would refer him to a doctor at [facility].  No follow-ups have been arranged but if he needs a referral to [place] or [place] orthopedics we would be happy to do that

## 2016-01-25 NOTE — Patient Instructions (Signed)
He will see Dr Carloyn Manner, he will make the appointment

## 2016-01-27 ENCOUNTER — Telehealth: Payer: Self-pay | Admitting: Orthopedic Surgery

## 2016-01-27 NOTE — Telephone Encounter (Signed)
Routing to Dr. Harrison to advise 

## 2016-01-27 NOTE — Telephone Encounter (Signed)
Patient/wife called back with the following information regarding their attempt to schedule appointment with Dr Carloyn Manner, neurosurgeon - advised by this office that an MRI or CT is needed; therefore, said our office would need to order, and then make the referral.  Please advise.

## 2016-01-28 NOTE — Telephone Encounter (Signed)
Step 1 has to go to pt for six weeks   Then ov  Then if still pain then [employer] required   [place] to start the process

## 2016-02-01 NOTE — Telephone Encounter (Signed)
Patient's wife states that he doesn't respond to therapy, and she just wants to wait until she can again speak with Dr. Carloyn Manner. Ask to put on hold for now.

## 2016-04-26 DIAGNOSIS — Z0000 Encounter for general adult medical examination without abnormal findings: Secondary | ICD-10-CM | POA: Diagnosis not present

## 2016-04-28 DIAGNOSIS — K219 Gastro-esophageal reflux disease without esophagitis: Secondary | ICD-10-CM | POA: Diagnosis not present

## 2016-04-28 DIAGNOSIS — J449 Chronic obstructive pulmonary disease, unspecified: Secondary | ICD-10-CM | POA: Diagnosis not present

## 2016-04-28 DIAGNOSIS — Z0000 Encounter for general adult medical examination without abnormal findings: Secondary | ICD-10-CM | POA: Diagnosis not present

## 2016-04-28 DIAGNOSIS — I10 Essential (primary) hypertension: Secondary | ICD-10-CM | POA: Diagnosis not present

## 2016-04-28 DIAGNOSIS — E785 Hyperlipidemia, unspecified: Secondary | ICD-10-CM | POA: Diagnosis not present

## 2016-05-10 DIAGNOSIS — Z1211 Encounter for screening for malignant neoplasm of colon: Secondary | ICD-10-CM | POA: Diagnosis not present

## 2016-05-16 DIAGNOSIS — Z85828 Personal history of other malignant neoplasm of skin: Secondary | ICD-10-CM | POA: Diagnosis not present

## 2016-05-16 DIAGNOSIS — L259 Unspecified contact dermatitis, unspecified cause: Secondary | ICD-10-CM | POA: Diagnosis not present

## 2016-11-14 DIAGNOSIS — G4733 Obstructive sleep apnea (adult) (pediatric): Secondary | ICD-10-CM | POA: Diagnosis not present

## 2016-11-14 DIAGNOSIS — K219 Gastro-esophageal reflux disease without esophagitis: Secondary | ICD-10-CM | POA: Diagnosis not present

## 2016-11-14 DIAGNOSIS — I10 Essential (primary) hypertension: Secondary | ICD-10-CM | POA: Diagnosis not present

## 2016-11-14 DIAGNOSIS — J449 Chronic obstructive pulmonary disease, unspecified: Secondary | ICD-10-CM | POA: Diagnosis not present

## 2017-02-13 DIAGNOSIS — J449 Chronic obstructive pulmonary disease, unspecified: Secondary | ICD-10-CM | POA: Diagnosis not present

## 2017-02-13 DIAGNOSIS — I10 Essential (primary) hypertension: Secondary | ICD-10-CM | POA: Diagnosis not present

## 2017-02-13 DIAGNOSIS — G4733 Obstructive sleep apnea (adult) (pediatric): Secondary | ICD-10-CM | POA: Diagnosis not present

## 2017-02-13 DIAGNOSIS — K219 Gastro-esophageal reflux disease without esophagitis: Secondary | ICD-10-CM | POA: Diagnosis not present

## 2017-04-27 DIAGNOSIS — G4733 Obstructive sleep apnea (adult) (pediatric): Secondary | ICD-10-CM | POA: Diagnosis not present

## 2017-05-16 DIAGNOSIS — Z0000 Encounter for general adult medical examination without abnormal findings: Secondary | ICD-10-CM | POA: Diagnosis not present

## 2017-05-23 DIAGNOSIS — J449 Chronic obstructive pulmonary disease, unspecified: Secondary | ICD-10-CM | POA: Diagnosis not present

## 2017-05-23 DIAGNOSIS — K219 Gastro-esophageal reflux disease without esophagitis: Secondary | ICD-10-CM | POA: Diagnosis not present

## 2017-05-23 DIAGNOSIS — Z0000 Encounter for general adult medical examination without abnormal findings: Secondary | ICD-10-CM | POA: Diagnosis not present

## 2017-05-23 DIAGNOSIS — E785 Hyperlipidemia, unspecified: Secondary | ICD-10-CM | POA: Diagnosis not present

## 2017-05-23 DIAGNOSIS — I10 Essential (primary) hypertension: Secondary | ICD-10-CM | POA: Diagnosis not present

## 2017-05-27 DIAGNOSIS — G4733 Obstructive sleep apnea (adult) (pediatric): Secondary | ICD-10-CM | POA: Diagnosis not present

## 2017-06-06 DIAGNOSIS — Z1211 Encounter for screening for malignant neoplasm of colon: Secondary | ICD-10-CM | POA: Diagnosis not present

## 2017-06-15 DIAGNOSIS — B029 Zoster without complications: Secondary | ICD-10-CM | POA: Diagnosis not present

## 2017-06-27 DIAGNOSIS — G4733 Obstructive sleep apnea (adult) (pediatric): Secondary | ICD-10-CM | POA: Diagnosis not present

## 2017-07-10 DIAGNOSIS — J449 Chronic obstructive pulmonary disease, unspecified: Secondary | ICD-10-CM | POA: Diagnosis not present

## 2017-07-10 DIAGNOSIS — I10 Essential (primary) hypertension: Secondary | ICD-10-CM | POA: Diagnosis not present

## 2017-07-10 DIAGNOSIS — G4733 Obstructive sleep apnea (adult) (pediatric): Secondary | ICD-10-CM | POA: Diagnosis not present

## 2017-07-10 DIAGNOSIS — B029 Zoster without complications: Secondary | ICD-10-CM | POA: Diagnosis not present

## 2017-07-28 DIAGNOSIS — G4733 Obstructive sleep apnea (adult) (pediatric): Secondary | ICD-10-CM | POA: Diagnosis not present

## 2017-08-27 DIAGNOSIS — G4733 Obstructive sleep apnea (adult) (pediatric): Secondary | ICD-10-CM | POA: Diagnosis not present

## 2017-09-11 DIAGNOSIS — I10 Essential (primary) hypertension: Secondary | ICD-10-CM | POA: Diagnosis not present

## 2017-09-11 DIAGNOSIS — Z23 Encounter for immunization: Secondary | ICD-10-CM | POA: Diagnosis not present

## 2017-09-11 DIAGNOSIS — K210 Gastro-esophageal reflux disease with esophagitis: Secondary | ICD-10-CM | POA: Diagnosis not present

## 2017-09-11 DIAGNOSIS — J449 Chronic obstructive pulmonary disease, unspecified: Secondary | ICD-10-CM | POA: Diagnosis not present

## 2017-09-27 DIAGNOSIS — G4733 Obstructive sleep apnea (adult) (pediatric): Secondary | ICD-10-CM | POA: Diagnosis not present

## 2017-10-27 DIAGNOSIS — G4733 Obstructive sleep apnea (adult) (pediatric): Secondary | ICD-10-CM | POA: Diagnosis not present

## 2017-11-27 DIAGNOSIS — G4733 Obstructive sleep apnea (adult) (pediatric): Secondary | ICD-10-CM | POA: Diagnosis not present

## 2017-12-04 DIAGNOSIS — G4733 Obstructive sleep apnea (adult) (pediatric): Secondary | ICD-10-CM | POA: Diagnosis not present

## 2017-12-12 DIAGNOSIS — I10 Essential (primary) hypertension: Secondary | ICD-10-CM | POA: Diagnosis not present

## 2017-12-12 DIAGNOSIS — G4733 Obstructive sleep apnea (adult) (pediatric): Secondary | ICD-10-CM | POA: Diagnosis not present

## 2017-12-12 DIAGNOSIS — K219 Gastro-esophageal reflux disease without esophagitis: Secondary | ICD-10-CM | POA: Diagnosis not present

## 2017-12-12 DIAGNOSIS — J449 Chronic obstructive pulmonary disease, unspecified: Secondary | ICD-10-CM | POA: Diagnosis not present

## 2017-12-28 DIAGNOSIS — G4733 Obstructive sleep apnea (adult) (pediatric): Secondary | ICD-10-CM | POA: Diagnosis not present

## 2018-01-25 DIAGNOSIS — G4733 Obstructive sleep apnea (adult) (pediatric): Secondary | ICD-10-CM | POA: Diagnosis not present

## 2018-03-13 DIAGNOSIS — J449 Chronic obstructive pulmonary disease, unspecified: Secondary | ICD-10-CM | POA: Diagnosis not present

## 2018-03-13 DIAGNOSIS — G4733 Obstructive sleep apnea (adult) (pediatric): Secondary | ICD-10-CM | POA: Diagnosis not present

## 2018-03-13 DIAGNOSIS — I10 Essential (primary) hypertension: Secondary | ICD-10-CM | POA: Diagnosis not present

## 2018-03-13 DIAGNOSIS — R296 Repeated falls: Secondary | ICD-10-CM | POA: Diagnosis not present

## 2018-06-13 DIAGNOSIS — Z0000 Encounter for general adult medical examination without abnormal findings: Secondary | ICD-10-CM | POA: Diagnosis not present

## 2018-07-09 DIAGNOSIS — I10 Essential (primary) hypertension: Secondary | ICD-10-CM | POA: Diagnosis not present

## 2018-07-09 DIAGNOSIS — Z0000 Encounter for general adult medical examination without abnormal findings: Secondary | ICD-10-CM | POA: Diagnosis not present

## 2018-07-09 DIAGNOSIS — R296 Repeated falls: Secondary | ICD-10-CM | POA: Diagnosis not present

## 2018-07-16 DIAGNOSIS — S30810A Abrasion of lower back and pelvis, initial encounter: Secondary | ICD-10-CM | POA: Diagnosis not present

## 2018-09-11 DIAGNOSIS — I10 Essential (primary) hypertension: Secondary | ICD-10-CM | POA: Diagnosis not present

## 2018-09-11 DIAGNOSIS — M25562 Pain in left knee: Secondary | ICD-10-CM | POA: Diagnosis not present

## 2018-09-11 DIAGNOSIS — R296 Repeated falls: Secondary | ICD-10-CM | POA: Diagnosis not present

## 2018-09-11 DIAGNOSIS — Z23 Encounter for immunization: Secondary | ICD-10-CM | POA: Diagnosis not present

## 2018-09-11 DIAGNOSIS — J449 Chronic obstructive pulmonary disease, unspecified: Secondary | ICD-10-CM | POA: Diagnosis not present

## 2018-12-12 DIAGNOSIS — J449 Chronic obstructive pulmonary disease, unspecified: Secondary | ICD-10-CM | POA: Diagnosis not present

## 2018-12-12 DIAGNOSIS — R296 Repeated falls: Secondary | ICD-10-CM | POA: Diagnosis not present

## 2018-12-12 DIAGNOSIS — I10 Essential (primary) hypertension: Secondary | ICD-10-CM | POA: Diagnosis not present

## 2019-03-11 DIAGNOSIS — I10 Essential (primary) hypertension: Secondary | ICD-10-CM | POA: Diagnosis not present

## 2019-03-11 DIAGNOSIS — G4733 Obstructive sleep apnea (adult) (pediatric): Secondary | ICD-10-CM | POA: Diagnosis not present

## 2019-03-11 DIAGNOSIS — J449 Chronic obstructive pulmonary disease, unspecified: Secondary | ICD-10-CM | POA: Diagnosis not present

## 2019-03-11 DIAGNOSIS — K210 Gastro-esophageal reflux disease with esophagitis: Secondary | ICD-10-CM | POA: Diagnosis not present

## 2019-06-14 DIAGNOSIS — M1990 Unspecified osteoarthritis, unspecified site: Secondary | ICD-10-CM | POA: Diagnosis not present

## 2019-06-14 DIAGNOSIS — J449 Chronic obstructive pulmonary disease, unspecified: Secondary | ICD-10-CM | POA: Diagnosis not present

## 2019-06-19 ENCOUNTER — Emergency Department (HOSPITAL_COMMUNITY): Payer: Medicare Other

## 2019-06-19 ENCOUNTER — Inpatient Hospital Stay (HOSPITAL_COMMUNITY)
Admission: EM | Admit: 2019-06-19 | Discharge: 2019-06-22 | DRG: 641 | Disposition: A | Discharge status: Skilled Nursing Facility | Payer: Medicare Other | Attending: Pulmonary Disease | Admitting: Pulmonary Disease

## 2019-06-19 ENCOUNTER — Encounter (HOSPITAL_COMMUNITY): Payer: Self-pay

## 2019-06-19 ENCOUNTER — Other Ambulatory Visit: Payer: Self-pay

## 2019-06-19 DIAGNOSIS — R262 Difficulty in walking, not elsewhere classified: Secondary | ICD-10-CM | POA: Diagnosis not present

## 2019-06-19 DIAGNOSIS — S3992XA Unspecified injury of lower back, initial encounter: Secondary | ICD-10-CM | POA: Diagnosis not present

## 2019-06-19 DIAGNOSIS — W19XXXA Unspecified fall, initial encounter: Secondary | ICD-10-CM | POA: Diagnosis present

## 2019-06-19 DIAGNOSIS — M25551 Pain in right hip: Secondary | ICD-10-CM | POA: Diagnosis not present

## 2019-06-19 DIAGNOSIS — J449 Chronic obstructive pulmonary disease, unspecified: Secondary | ICD-10-CM | POA: Diagnosis present

## 2019-06-19 DIAGNOSIS — M1990 Unspecified osteoarthritis, unspecified site: Secondary | ICD-10-CM | POA: Diagnosis not present

## 2019-06-19 DIAGNOSIS — E785 Hyperlipidemia, unspecified: Secondary | ICD-10-CM | POA: Diagnosis not present

## 2019-06-19 DIAGNOSIS — H9193 Unspecified hearing loss, bilateral: Secondary | ICD-10-CM | POA: Diagnosis not present

## 2019-06-19 DIAGNOSIS — R296 Repeated falls: Secondary | ICD-10-CM | POA: Diagnosis present

## 2019-06-19 DIAGNOSIS — K219 Gastro-esophageal reflux disease without esophagitis: Secondary | ICD-10-CM | POA: Diagnosis not present

## 2019-06-19 DIAGNOSIS — R627 Adult failure to thrive: Secondary | ICD-10-CM | POA: Diagnosis present

## 2019-06-19 DIAGNOSIS — R531 Weakness: Secondary | ICD-10-CM | POA: Diagnosis not present

## 2019-06-19 DIAGNOSIS — Z87891 Personal history of nicotine dependence: Secondary | ICD-10-CM

## 2019-06-19 DIAGNOSIS — M25562 Pain in left knee: Secondary | ICD-10-CM | POA: Diagnosis not present

## 2019-06-19 DIAGNOSIS — M19042 Primary osteoarthritis, left hand: Secondary | ICD-10-CM | POA: Diagnosis not present

## 2019-06-19 DIAGNOSIS — M5136 Other intervertebral disc degeneration, lumbar region: Secondary | ICD-10-CM | POA: Diagnosis not present

## 2019-06-19 DIAGNOSIS — Z20828 Contact with and (suspected) exposure to other viral communicable diseases: Secondary | ICD-10-CM | POA: Diagnosis present

## 2019-06-19 DIAGNOSIS — M19041 Primary osteoarthritis, right hand: Secondary | ICD-10-CM | POA: Diagnosis not present

## 2019-06-19 DIAGNOSIS — M109 Gout, unspecified: Secondary | ICD-10-CM | POA: Diagnosis not present

## 2019-06-19 DIAGNOSIS — M79671 Pain in right foot: Secondary | ICD-10-CM | POA: Diagnosis not present

## 2019-06-19 DIAGNOSIS — G4733 Obstructive sleep apnea (adult) (pediatric): Secondary | ICD-10-CM | POA: Diagnosis present

## 2019-06-19 DIAGNOSIS — F0390 Unspecified dementia without behavioral disturbance: Secondary | ICD-10-CM | POA: Diagnosis present

## 2019-06-19 DIAGNOSIS — R269 Unspecified abnormalities of gait and mobility: Secondary | ICD-10-CM | POA: Diagnosis not present

## 2019-06-19 DIAGNOSIS — J310 Chronic rhinitis: Secondary | ICD-10-CM | POA: Diagnosis not present

## 2019-06-19 DIAGNOSIS — R5381 Other malaise: Secondary | ICD-10-CM | POA: Diagnosis not present

## 2019-06-19 DIAGNOSIS — J9811 Atelectasis: Secondary | ICD-10-CM | POA: Diagnosis present

## 2019-06-19 DIAGNOSIS — M549 Dorsalgia, unspecified: Secondary | ICD-10-CM

## 2019-06-19 DIAGNOSIS — M1712 Unilateral primary osteoarthritis, left knee: Secondary | ICD-10-CM | POA: Diagnosis present

## 2019-06-19 DIAGNOSIS — Z7982 Long term (current) use of aspirin: Secondary | ICD-10-CM

## 2019-06-19 DIAGNOSIS — Z4800 Encounter for change or removal of nonsurgical wound dressing: Secondary | ICD-10-CM | POA: Diagnosis not present

## 2019-06-19 DIAGNOSIS — E860 Dehydration: Principal | ICD-10-CM | POA: Diagnosis present

## 2019-06-19 DIAGNOSIS — K589 Irritable bowel syndrome without diarrhea: Secondary | ICD-10-CM | POA: Diagnosis not present

## 2019-06-19 DIAGNOSIS — B029 Zoster without complications: Secondary | ICD-10-CM | POA: Diagnosis not present

## 2019-06-19 DIAGNOSIS — E119 Type 2 diabetes mellitus without complications: Secondary | ICD-10-CM | POA: Diagnosis not present

## 2019-06-19 DIAGNOSIS — Z9181 History of falling: Secondary | ICD-10-CM | POA: Diagnosis not present

## 2019-06-19 DIAGNOSIS — M6281 Muscle weakness (generalized): Secondary | ICD-10-CM | POA: Diagnosis not present

## 2019-06-19 DIAGNOSIS — I10 Essential (primary) hypertension: Secondary | ICD-10-CM | POA: Diagnosis present

## 2019-06-19 DIAGNOSIS — D860 Sarcoidosis of lung: Secondary | ICD-10-CM | POA: Diagnosis not present

## 2019-06-19 DIAGNOSIS — M79605 Pain in left leg: Secondary | ICD-10-CM | POA: Diagnosis not present

## 2019-06-19 DIAGNOSIS — Z741 Need for assistance with personal care: Secondary | ICD-10-CM | POA: Diagnosis not present

## 2019-06-19 DIAGNOSIS — I2510 Atherosclerotic heart disease of native coronary artery without angina pectoris: Secondary | ICD-10-CM | POA: Diagnosis not present

## 2019-06-19 DIAGNOSIS — M51369 Other intervertebral disc degeneration, lumbar region without mention of lumbar back pain or lower extremity pain: Secondary | ICD-10-CM | POA: Diagnosis present

## 2019-06-19 DIAGNOSIS — L89620 Pressure ulcer of left heel, unstageable: Secondary | ICD-10-CM | POA: Diagnosis not present

## 2019-06-19 DIAGNOSIS — R2681 Unsteadiness on feet: Secondary | ICD-10-CM | POA: Diagnosis not present

## 2019-06-19 DIAGNOSIS — Z791 Long term (current) use of non-steroidal anti-inflammatories (NSAID): Secondary | ICD-10-CM | POA: Diagnosis not present

## 2019-06-19 DIAGNOSIS — G4730 Sleep apnea, unspecified: Secondary | ICD-10-CM | POA: Diagnosis not present

## 2019-06-19 DIAGNOSIS — M25572 Pain in left ankle and joints of left foot: Secondary | ICD-10-CM | POA: Diagnosis not present

## 2019-06-19 LAB — CBC WITH DIFFERENTIAL/PLATELET
Abs Immature Granulocytes: 0.03 10*3/uL (ref 0.00–0.07)
Basophils Absolute: 0.1 10*3/uL (ref 0.0–0.1)
Basophils Relative: 2 %
Eosinophils Absolute: 0.7 10*3/uL — ABNORMAL HIGH (ref 0.0–0.5)
Eosinophils Relative: 8 %
HCT: 44.4 % (ref 39.0–52.0)
Hemoglobin: 14.2 g/dL (ref 13.0–17.0)
Immature Granulocytes: 0 %
Lymphocytes Relative: 18 %
Lymphs Abs: 1.5 10*3/uL (ref 0.7–4.0)
MCH: 30 pg (ref 26.0–34.0)
MCHC: 32 g/dL (ref 30.0–36.0)
MCV: 93.7 fL (ref 80.0–100.0)
Monocytes Absolute: 0.6 10*3/uL (ref 0.1–1.0)
Monocytes Relative: 7 %
Neutro Abs: 5.5 10*3/uL (ref 1.7–7.7)
Neutrophils Relative %: 65 %
Platelets: 317 10*3/uL (ref 150–400)
RBC: 4.74 MIL/uL (ref 4.22–5.81)
RDW: 13.1 % (ref 11.5–15.5)
WBC: 8.4 10*3/uL (ref 4.0–10.5)
nRBC: 0 % (ref 0.0–0.2)

## 2019-06-19 LAB — URINALYSIS, ROUTINE W REFLEX MICROSCOPIC
Bilirubin Urine: NEGATIVE
Glucose, UA: NEGATIVE mg/dL
Hgb urine dipstick: NEGATIVE
Ketones, ur: NEGATIVE mg/dL
Leukocytes,Ua: NEGATIVE
Nitrite: NEGATIVE
Protein, ur: NEGATIVE mg/dL
Specific Gravity, Urine: 1.009 (ref 1.005–1.030)
pH: 5 (ref 5.0–8.0)

## 2019-06-19 LAB — LACTIC ACID, PLASMA
Lactic Acid, Venous: 0.9 mmol/L (ref 0.5–1.9)
Lactic Acid, Venous: 1 mmol/L (ref 0.5–1.9)

## 2019-06-19 LAB — COMPREHENSIVE METABOLIC PANEL
ALT: 17 U/L (ref 0–44)
AST: 17 U/L (ref 15–41)
Albumin: 3.4 g/dL — ABNORMAL LOW (ref 3.5–5.0)
Alkaline Phosphatase: 79 U/L (ref 38–126)
Anion gap: 8 (ref 5–15)
BUN: 26 mg/dL — ABNORMAL HIGH (ref 8–23)
CO2: 25 mmol/L (ref 22–32)
Calcium: 8.8 mg/dL — ABNORMAL LOW (ref 8.9–10.3)
Chloride: 105 mmol/L (ref 98–111)
Creatinine, Ser: 0.82 mg/dL (ref 0.61–1.24)
GFR calc Af Amer: >60 mL/min (ref >60)
GFR calc non Af Amer: >60 mL/min (ref >60)
Glucose, Bld: 106 mg/dL — ABNORMAL HIGH (ref 70–99)
Potassium: 4.1 mmol/L (ref 3.5–5.1)
Sodium: 138 mmol/L (ref 135–145)
Total Bilirubin: 0.4 mg/dL (ref 0.3–1.2)
Total Protein: 6.6 g/dL (ref 6.5–8.1)

## 2019-06-19 LAB — TROPONIN I (HIGH SENSITIVITY)
Troponin I (High Sensitivity): 4 ng/L (ref <18)
Troponin I (High Sensitivity): 4 ng/L (ref <18)

## 2019-06-19 LAB — CK: Total CK: 41 U/L — ABNORMAL LOW (ref 49–397)

## 2019-06-19 LAB — SARS CORONAVIRUS 2 BY RT PCR (HOSPITAL ORDER, PERFORMED IN ~~LOC~~ HOSPITAL LAB): SARS Coronavirus 2: NEGATIVE

## 2019-06-19 MED ORDER — ONDANSETRON HCL 4 MG PO TABS: 4.0000 mg | ORAL_TABLET | Freq: Four times a day (QID) | ORAL | Status: DC | PRN

## 2019-06-19 MED ORDER — ONDANSETRON HCL 4 MG/2ML IJ SOLN: 4.0000 mg | Freq: Four times a day (QID) | INTRAMUSCULAR | Status: DC | PRN

## 2019-06-19 MED ORDER — SIMVASTATIN 20 MG PO TABS
20.0000 mg | ORAL_TABLET | Freq: Every day | ORAL | Status: DC
  Administered 2019-06-19 – 2019-06-21 (×3): 20 mg via ORAL
  Filled 2019-06-19 (×2): qty 1

## 2019-06-19 MED ORDER — ENOXAPARIN SODIUM 40 MG/0.4ML ~~LOC~~ SOLN
40.0000 mg | SUBCUTANEOUS | Status: DC
  Administered 2019-06-19 – 2019-06-21 (×3): 40 mg via SUBCUTANEOUS
  Filled 2019-06-19 (×3): qty 0.4

## 2019-06-19 MED ORDER — ASPIRIN EC 81 MG PO TBEC
81.0000 mg | DELAYED_RELEASE_TABLET | Freq: Every day | ORAL | Status: DC
  Administered 2019-06-20 – 2019-06-22 (×3): 81 mg via ORAL
  Filled 2019-06-19 (×3): qty 1

## 2019-06-19 MED ORDER — SODIUM CHLORIDE 0.9 % IV SOLN
1000.0000 mL | INTRAVENOUS | Status: DC
  Administered 2019-06-19: 1000 mL via INTRAVENOUS

## 2019-06-19 MED ORDER — PANTOPRAZOLE SODIUM 40 MG PO TBEC
40.0000 mg | DELAYED_RELEASE_TABLET | Freq: Every day | ORAL | Status: DC
  Administered 2019-06-20 – 2019-06-22 (×3): 40 mg via ORAL
  Filled 2019-06-19 (×3): qty 1

## 2019-06-19 MED ORDER — AMLODIPINE BESYLATE 5 MG PO TABS
10.0000 mg | ORAL_TABLET | Freq: Every day | ORAL | Status: DC
  Administered 2019-06-20 – 2019-06-22 (×3): 10 mg via ORAL
  Filled 2019-06-19 (×3): qty 2

## 2019-06-19 MED ORDER — MELOXICAM 7.5 MG PO TABS
7.5000 mg | ORAL_TABLET | Freq: Every day | ORAL | Status: DC
  Administered 2019-06-20 – 2019-06-22 (×3): 7.5 mg via ORAL
  Filled 2019-06-19 (×3): qty 1

## 2019-06-19 MED ORDER — SODIUM CHLORIDE 0.9 % IV SOLN
INTRAVENOUS | Status: DC
  Administered 2019-06-20 – 2019-06-21 (×3): via INTRAVENOUS

## 2019-06-19 MED ORDER — LISINOPRIL 10 MG PO TABS
20.0000 mg | ORAL_TABLET | Freq: Every day | ORAL | Status: DC
  Administered 2019-06-20 – 2019-06-22 (×3): 20 mg via ORAL
  Filled 2019-06-19 (×3): qty 2

## 2019-06-19 MED ORDER — ALLOPURINOL 300 MG PO TABS
300.0000 mg | ORAL_TABLET | Freq: Every day | ORAL | Status: DC
  Administered 2019-06-20 – 2019-06-22 (×3): 300 mg via ORAL
  Filled 2019-06-19 (×3): qty 1

## 2019-06-19 MED ORDER — DONEPEZIL HCL 5 MG PO TABS
10.0000 mg | ORAL_TABLET | Freq: Every day | ORAL | Status: DC
  Administered 2019-06-20 – 2019-06-22 (×3): 10 mg via ORAL
  Filled 2019-06-19 (×3): qty 2

## 2019-06-19 MED ORDER — SODIUM CHLORIDE 0.9 % IV BOLUS (SEPSIS)
500.0000 mL | Freq: Once | INTRAVENOUS | Status: AC
  Administered 2019-06-19: 500 mL via INTRAVENOUS

## 2019-06-19 NOTE — ED Notes (Signed)
Patient transported to MRI 

## 2019-06-19 NOTE — ED Notes (Signed)
Pt given meal tray. Sat pt up at 90 degree angle to eat.

## 2019-06-19 NOTE — ED Triage Notes (Signed)
[facility] and [facility] them because they notice pt hadn't been out of bed since Friday because he has had multiple falls over the past few months.  C/o generalized weakness and generalized arthritis pain.  bp 95/61 In bed, increased to 129/78.   Pt lives with spouse.   CBG 118

## 2019-06-19 NOTE — ED Provider Notes (Signed)
[facility] EMERGENCY DEPARTMENT Provider Note   CSN: [id number] Arrival date & time: 06/19/19  1337     History   Chief Complaint No chief complaint on file.   HPI Eric Bowman is a 85 y.o. male.     Patient is an 85 year old male who presents to the emergency department with a complaint of weakness.  The patient's spouse says that the patient is here because he cannot walk and he cannot sit any cannot stand, and this is been going on for 2 weeks.  The patient's spouse also says that the patient has had as many as 17 falls during the month of July.  It is been assigned is arthritis involving the lower extremities up to this point.  During the past 2 weeks the patient has had increasing problems.  The wife says that he has not been out of the bed for a week.  She discussed it with the primary physician and they told her to bring the patient here to the emergency department for evaluation.  When asked about getting out of bed, the patient states he is fearful of falling again.  The patient and the spouse state that the patient has not had any recent fever, chills, vomiting, or diarrhea.  The patient is eating his meals as the most part as usual.  There is been no recent changes in medications.  It is of note that the patient uses CPAP at home.  Patient was brought to the emergency department by EMS because of inability to ride in the car.  The history is provided by the patient and the spouse.    Past Medical History:  Diagnosis Date  . Cancer (HCC)    melanoma-left side  . Chronic pain    legs and feet  . Coronary artery disease   . Dementia ([place])   . Diabetes mellitus without complication ([place])   . Foot drop   . GERD (gastroesophageal reflux disease)   . Gout   . Hyperlipidemia   . Hypertension   . OCD (obsessive compulsive disorder)   . PONV (postoperative nausea and vomiting)   . Psychosis ([place])   . Reflux   . Sarcoidosis   . Sleep apnea    cpap    Patient Active  Problem List   Diagnosis Date Noted  . Arthritis of knee, left 01/03/2013  . Leg weakness, bilateral 01/03/2013  . Degenerative disc disease, lumbar 01/03/2013  . Coronary artery disease 09/13/2011  . EOSINOPHILIA 10/08/2008  . LEUKOCYTOSIS UNSPECIFIED 09/04/2008  . HYPERGLYCEMIA 09/04/2008  . SLEEP APNEA, OBSTRUCTIVE 09/10/2007  . RHINITIS, CHRONIC 05/16/2007  . 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 HYPERLIPIDEMIA 05/02/2007  . HYPERTENSION 05/02/2007  . BRONCHITIS, CHRONIC NOS 05/02/2007  . GERD 05/02/2007  . IRRITABLE BOWEL SYNDROME 05/02/2007  . DYSPNEA 05/02/2007    Past Surgical History:  Procedure Laterality Date  . BACK SURGERY     cervical neck  . CATARACT EXTRACTION W/PHACO  01/05/2012   Procedure: CATARACT EXTRACTION PHACO AND INTRAOCULAR LENS PLACEMENT (IOC);  Surgeon: [name] Branch, MD;  Location: [facility];  Service: Ophthalmology;  Laterality: Left;  CDE 18.47  . CATARACT EXTRACTION W/PHACO  02/02/2012   Procedure: CATARACT EXTRACTION PHACO AND INTRAOCULAR LENS PLACEMENT (IOC);  Surgeon: [name] Branch, MD;  Location: [facility];  Service: Ophthalmology;  Laterality: Right;  CDE:15.38  . CHOLECYSTECTOMY    . GALLBLADDER SURGERY    . hemorhoidectomy    . MELANOMA EXCISION     left side-[name]  .  TONSILLECTOMY          Home Medications    Prior to Admission medications   Medication Sig Start Date End Date Taking? Authorizing Provider  acetaminophen (TYLENOL) 500 MG tablet Take 500-1,000 mg by mouth every 6 (six) hours as needed. For pain    [provider]  allopurinol (ZYLOPRIM) 300 MG tablet Take 300 mg by mouth daily.      [provider]  amLODipine (NORVASC) 10 MG tablet Take 10 mg by mouth daily.      [provider]  aspirin EC 81 MG tablet Take 81 mg by mouth daily.    [provider]  donepezil (ARICEPT) 10 MG tablet Take 10 mg by mouth daily. 02/19/14   [provider]  furosemide (LASIX) 40 MG tablet Take 40 mg by  mouth daily.      [provider]  HYDROcodone-acetaminophen (NORCO/VICODIN) 5-325 MG per tablet Take one-two tabs po q 4-6 hrs prn pain 03/25/14   Triplett, Tammy, PA-C  lisinopril (PRINIVIL,ZESTRIL) 20 MG tablet Take 20 mg by mouth daily.      [provider]  meloxicam (MOBIC) 7.5 MG tablet Take 7.5 mg by mouth daily.      [provider]  Multiple Vitamin (MULITIVITAMIN WITH MINERALS) TABS Take 1 tablet by mouth daily.    [provider]  nitroGLYCERIN (NITROSTAT) 0.4 MG SL tablet Place 1 tablet (0.4 mg total) under the tongue every 5 (five) minutes as needed for chest pain. 09/13/11 09/12/12  Wall, Marijo Conception, MD  omeprazole (PRILOSEC) 20 MG capsule Take 20 mg by mouth daily.      [provider]  potassium chloride SA (K-DUR,KLOR-CON) 20 MEQ tablet Take 20 mEq by mouth daily.      [provider]  simvastatin (ZOCOR) 20 MG tablet Take 20 mg by mouth at bedtime.      [provider]    Family History Family History  Problem Relation Age of Onset  . Heart disease Other   . Arthritis Other   . Cancer Other   . Anesthesia problems Neg Hx   . Hypotension Neg Hx   . Malignant hyperthermia Neg Hx   . Pseudochol deficiency Neg Hx     Social History Social History   Tobacco Use  . Smoking status: Former Smoker    Packs/day: 0.25    Years: 20.00    Pack years: 5.00    Types: Pipe    Quit date: 01/02/1974    Years since quitting: 45.4  . Smokeless tobacco: Never Used  Substance Use Topics  . Alcohol use: No  . Drug use: No     Allergies   Penicillins, Sulfonamide derivatives, and Tetanus toxoid   Review of Systems Review of Systems  Constitutional: Negative for activity change.       All ROS Neg except as noted in HPI  HENT: Negative.   Eyes: Negative for photophobia and discharge.  Respiratory: Negative for cough, shortness of breath and wheezing.   Cardiovascular: Negative for chest pain and palpitations.   Gastrointestinal: Negative for abdominal pain and blood in stool.  Genitourinary: Negative for dysuria, frequency and hematuria.  Musculoskeletal: Positive for arthralgias and back pain. Negative for neck pain.  Skin: Negative.   Neurological: Positive for weakness. Negative for dizziness, seizures and speech difficulty.  Psychiatric/Behavioral: Negative for confusion and hallucinations.     Physical Exam Updated Vital Signs BP (!) 109/51 (BP Location: Right Arm)   Pulse 76  Temp 98.1 F (36.7 C) (Oral)   Resp 16   Wt 88.5 kg   SpO2 95%   BMI 27.98 kg/m   Physical Exam Vitals signs and nursing note reviewed.  Constitutional:      Appearance: He is well-developed. He is not toxic-appearing.  HENT:     Head: Normocephalic.     Right Ear: Tympanic membrane and external ear normal.     Left Ear: Tympanic membrane and external ear normal.     Mouth/Throat:     Mouth: Mucous membranes are moist.  Eyes:     General: Lids are normal.     Conjunctiva/sclera: Conjunctivae normal.     Pupils: Pupils are equal, round, and reactive to light.  Neck:     Musculoskeletal: Normal range of motion and neck supple.     Vascular: No carotid bruit.  Cardiovascular:     Rate and Rhythm: Normal rate and regular rhythm.     Pulses: Normal pulses.     Heart sounds: Murmur present. Systolic murmur present with a grade of 2/6.  Pulmonary:     Effort: No respiratory distress.     Breath sounds: Normal breath sounds.  Abdominal:     General: Bowel sounds are normal. There is no distension.     Palpations: Abdomen is soft.     Tenderness: There is no abdominal tenderness. There is no guarding.  Musculoskeletal: Normal range of motion.        General: No tenderness.     Comments: Negative Homans sign  Lymphadenopathy:     Head:     Right side of head: No submandibular adenopathy.     Left side of head: No submandibular adenopathy.     Cervical: No cervical adenopathy.  Skin:    General:  Skin is warm and dry.     Coloration: Skin is not jaundiced.  Neurological:     Mental Status: He is alert. Mental status is at baseline.     Cranial Nerves: No cranial nerve deficit.     Sensory: No sensory deficit.  Psychiatric:        Speech: Speech normal.      ED Treatments / Results  Labs (all labs ordered are listed, but only abnormal results are displayed) Labs Reviewed  COMPREHENSIVE METABOLIC PANEL  CBC WITH DIFFERENTIAL/PLATELET  URINALYSIS, ROUTINE W REFLEX MICROSCOPIC  TROPONIN I (HIGH SENSITIVITY)    EKG None  Radiology No results found.  Procedures Procedures (including critical care time)  Medications Ordered in ED Medications - No data to display   Initial Impression / Assessment and Plan / ED Course  I have reviewed the triage vital signs and the nursing notes.  Pertinent labs & imaging results that were available during my care of the patient were reviewed by me and considered in my medical decision making (see chart for details).          Final Clinical Impressions(s) / ED Diagnoses MDM  Vital signs reviewed.  Pulse oximetry is 96 to 95% on room air. Will evaluate chemistries for possible metabolic cause of falling, and not standing.  The comprehensive metabolic panel shows the BUN to be slightly elevated at 26, the albumin is slightly low at 3.4, otherwise the comprehensive metabolic panel is within normal limits.  The anion gap is normal at 8. The troponin is within normal limits at 4, doubt acute cardiac event.  The complete blood count is within normal limits. Lactic acid is within normal limits.  Work-up continues.  Patient's care will be continued by Dr. Nanda Quinton.   Final diagnoses:  Gait instability  Frequent falls    ED Discharge Orders    None       Lily Kocher, Hershal Coria 06/21/19 2057    Margette Fast, MD 06/26/19 [phone]

## 2019-06-19 NOTE — H&P (Signed)
TRH H&P    Patient Demographics:    Eric Bowman, is a 85 y.o. male  MRN: [id number]  DOB - [birth year]  Admit Date - 06/19/2019  Referring MD/NP/PA: Dr. Laverta Baltimore  Outpatient Primary MD for the patient is Sinda Du, MD  Patient coming from: Home  Chief complaint-fall   HPI:    Eric Bowman  is a 85 y.o. male, with history of dementia, CAD, hyperlipidemia, hypertension, sleep apnea on CPAP, was brought to the hospital as patient is having difficulty walking at home.  Over the past 2 weeks patient has as many as 17 falls.  Patient is a poor historian, history obtained from the ED notes.  As per wife patient has not been out of bed for a week.  In the ED CT of the head was unremarkable, CT lumbar spine showed multilevel disc degenerative disease.  Patient denies chest pain or shortness of breath. Denies nausea vomiting or diarrhea. Denies abdominal pain or dysuria. Denies coughing up any phlegm.    Review of systems:    In addition to the HPI above,    All other systems reviewed and are negative.    Past History of the following :    Past Medical History:  Diagnosis Date  . Cancer (HCC)    melanoma-left side  . Chronic pain    legs and feet  . Coronary artery disease   . Dementia ([place])   . Diabetes mellitus without complication ([place])   . Foot drop   . GERD (gastroesophageal reflux disease)   . Gout   . Hyperlipidemia   . Hypertension   . OCD (obsessive compulsive disorder)   . PONV (postoperative nausea and vomiting)   . Psychosis ([place])   . Reflux   . Sarcoidosis   . Sleep apnea    cpap      Past Surgical History:  Procedure Laterality Date  . BACK SURGERY     cervical neck  . CATARACT EXTRACTION W/PHACO  01/05/2012   Procedure: CATARACT EXTRACTION PHACO AND INTRAOCULAR LENS PLACEMENT (IOC);  Surgeon: [name] Branch, MD;  Location: [facility];  Service: Ophthalmology;  Laterality: Left;   CDE 18.47  . CATARACT EXTRACTION W/PHACO  02/02/2012   Procedure: CATARACT EXTRACTION PHACO AND INTRAOCULAR LENS PLACEMENT (IOC);  Surgeon: [name] Branch, MD;  Location: [facility];  Service: Ophthalmology;  Laterality: Right;  CDE:15.38  . CHOLECYSTECTOMY    . GALLBLADDER SURGERY    . hemorhoidectomy    . MELANOMA EXCISION     left side-[name]  . TONSILLECTOMY        Social History:      Social History   Tobacco Use  . Smoking status: Former Smoker    Packs/day: 0.25    Years: 20.00    Pack years: 5.00    Types: Pipe    Quit date: 01/02/1974    Years since quitting: 45.4  . Smokeless tobacco: Never Used  Substance Use Topics  . Alcohol use: No       Family History :  Family History  Problem Relation Age of Onset  . Heart disease Other   . Arthritis Other   . Cancer Other   . Anesthesia problems Neg Hx   . Hypotension Neg Hx   . Malignant hyperthermia Neg Hx   . Pseudochol deficiency Neg Hx       Home Medications:   Prior to Admission medications   Medication Sig Start Date End Date Taking? Authorizing Provider  acetaminophen (TYLENOL) 500 MG tablet Take 500-1,000 mg by mouth every 6 (six) hours as needed. For pain   Yes [provider]  allopurinol (ZYLOPRIM) 300 MG tablet Take 300 mg by mouth daily.     Yes [provider]  amLODipine (NORVASC) 10 MG tablet Take 10 mg by mouth daily.     Yes [provider]  aspirin EC 81 MG tablet Take 81 mg by mouth daily.   Yes [provider]  donepezil (ARICEPT) 10 MG tablet Take 10 mg by mouth daily. 02/19/14  Yes [provider]  furosemide (LASIX) 40 MG tablet Take 40 mg by mouth daily.     Yes [provider]  HYDROcodone-acetaminophen (NORCO/VICODIN) 5-325 MG per tablet Take one-two tabs po q 4-6 hrs prn pain 03/25/14  Yes Triplett, Tammy, PA-C  lisinopril (PRINIVIL,ZESTRIL) 20 MG tablet Take 20 mg by mouth daily.     Yes [provider]  meloxicam (MOBIC)  7.5 MG tablet Take 7.5 mg by mouth daily.     Yes [provider]  Multiple Vitamin (MULITIVITAMIN WITH MINERALS) TABS Take 1 tablet by mouth daily.   Yes [provider]  omeprazole (PRILOSEC) 20 MG capsule Take 20 mg by mouth daily.     Yes [provider]  potassium chloride SA (K-DUR,KLOR-CON) 20 MEQ tablet Take 20 mEq by mouth daily.     Yes [provider]  simvastatin (ZOCOR) 20 MG tablet Take 20 mg by mouth at bedtime.     Yes [provider]  nitroGLYCERIN (NITROSTAT) 0.4 MG SL tablet Place 1 tablet (0.4 mg total) under the tongue every 5 (five) minutes as needed for chest pain. 09/13/11 09/12/12  Wall, Marijo Conception, MD     Allergies:     Allergies  Allergen Reactions  . Penicillins Rash  . Sulfonamide Derivatives Rash  . Tetanus Toxoid Rash     Physical Exam:   Vitals  Blood pressure (!) 116/97, pulse 85, temperature 98.1 F (36.7 C), temperature source Oral, resp. rate 16, weight 88.5 kg, SpO2 96 %.  1.  General: Appears in no acute distress  2. Psychiatric: Alert, communicating, oriented x2  3. Neurologic: Cranial nerves II through XII grossly intact, moving all extremities, sensations intact bilaterally  4. HEENMT:  Atraumatic normocephalic, extraocular muscles intact  5. Respiratory : Clear to auscultation bilaterally  6. Cardiovascular : S1-S2, regular, no murmur auscultated  7. Gastrointestinal:  Abdomen is soft, nontender, no organomegaly     Data Review:    CBC Recent Labs  Lab 06/19/19 1504  WBC 8.4  HGB 14.2  HCT 44.4  PLT 317  MCV 93.7  MCH 30.0  MCHC 32.0  RDW 13.1  LYMPHSABS 1.5  MONOABS 0.6  EOSABS 0.7*  BASOSABS 0.1   ------------------------------------------------------------------------------------------------------------------  Results for orders placed or performed during the hospital encounter of 06/19/19 (from the past 48 hour(s))  Urinalysis, Routine w reflex microscopic      Status: Abnormal   Collection Time: 06/19/19  3:00 PM  Result Value Ref Range  Color, Urine STRAW (A) YELLOW   APPearance CLEAR CLEAR   Specific Gravity, Urine 1.009 1.005 - 1.030   pH 5.0 5.0 - 8.0   Glucose, UA NEGATIVE NEGATIVE mg/dL   Hgb urine dipstick NEGATIVE NEGATIVE   Bilirubin Urine NEGATIVE NEGATIVE   Ketones, ur NEGATIVE NEGATIVE mg/dL   Protein, ur NEGATIVE NEGATIVE mg/dL   Nitrite NEGATIVE NEGATIVE   Leukocytes,Ua NEGATIVE NEGATIVE    Comment: Performed at [facility], [address], [place], [place] [address]  Comprehensive metabolic panel     Status: Abnormal   Collection Time: 06/19/19  3:04 PM  Result Value Ref Range   Sodium 138 135 - 145 mmol/L   Potassium 4.1 3.5 - 5.1 mmol/L   Chloride 105 98 - 111 mmol/L   CO2 25 22 - 32 mmol/L   Glucose, Bld 106 (H) 70 - 99 mg/dL   BUN 26 (H) 8 - 23 mg/dL   Creatinine, Ser 0.82 0.61 - 1.24 mg/dL   Calcium 8.8 (L) 8.9 - 10.3 mg/dL   Total Protein 6.6 6.5 - 8.1 g/dL   Albumin 3.4 (L) 3.5 - 5.0 g/dL   AST 17 15 - 41 U/L   ALT 17 0 - 44 U/L   Alkaline Phosphatase 79 38 - 126 U/L   Total Bilirubin 0.4 0.3 - 1.2 mg/dL   GFR calc non Af Amer >60 >60 mL/min   GFR calc Af Amer >60 >60 mL/min   Anion gap 8 5 - 15    Comment: Performed at [facility], [address], [place], [place] [address]  Troponin I (High Sensitivity)     Status: None   Collection Time: 06/19/19  3:04 PM  Result Value Ref Range   Troponin I (High Sensitivity) 4 <18 ng/L    Comment: (NOTE) Elevated high sensitivity troponin I (hsTnI) values and significant  changes across serial measurements may suggest ACS but many other  chronic and acute conditions are known to elevate hsTnI results.  Refer to the "Links" section for chest pain algorithms and additional  guidance. Performed at [facility], [address] [place], [place] [address]   CBC with Differential     Status: Abnormal   Collection Time: 06/19/19  3:04 PM  Result Value Ref Range   WBC  8.4 4.0 - 10.5 K/uL   RBC 4.74 4.22 - 5.81 MIL/uL   Hemoglobin 14.2 13.0 - 17.0 g/dL   HCT 44.4 39.0 - 52.0 %   MCV 93.7 80.0 - 100.0 fL   MCH 30.0 26.0 - 34.0 pg   MCHC 32.0 30.0 - 36.0 g/dL   RDW 13.1 11.5 - 15.5 %   Platelets 317 150 - 400 K/uL   nRBC 0.0 0.0 - 0.2 %   Neutrophils Relative % 65 %   Neutro Abs 5.5 1.7 - 7.7 K/uL   Lymphocytes Relative 18 %   Lymphs Abs 1.5 0.7 - 4.0 K/uL   Monocytes Relative 7 %   Monocytes Absolute 0.6 0.1 - 1.0 K/uL   Eosinophils Relative 8 %   Eosinophils Absolute 0.7 (H) 0.0 - 0.5 K/uL   Basophils Relative 2 %   Basophils Absolute 0.1 0.0 - 0.1 K/uL   Immature Granulocytes 0 %   Abs Immature Granulocytes 0.03 0.00 - 0.07 K/uL    Comment: Performed at [facility], [address], [place], [place] [address]  CK     Status: Abnormal   Collection Time: 06/19/19  3:04 PM  Result Value Ref Range  Total CK 41 (L) 49 - 397 U/L    Comment: Performed at [facility], [address], [place], [place] [address]  Lactic acid, plasma     Status: None   Collection Time: 06/19/19  3:04 PM  Result Value Ref Range   Lactic Acid, Venous 0.9 0.5 - 1.9 mmol/L    Comment: Performed at [facility], [address], [place], [place] [address]  Lactic acid, plasma     Status: None   Collection Time: 06/19/19  5:18 PM  Result Value Ref Range   Lactic Acid, Venous 1.0 0.5 - 1.9 mmol/L    Comment: Performed at [facility], [address] [place], [place] [address]  Troponin I (High Sensitivity)     Status: None   Collection Time: 06/19/19  5:18 PM  Result Value Ref Range   Troponin I (High Sensitivity) 4 <18 ng/L    Comment: (NOTE) Elevated high sensitivity troponin I (hsTnI) values and significant  changes across serial measurements may suggest ACS but many other  chronic and acute conditions are known to elevate hsTnI results.  Refer to the "Links" section for chest pain algorithms and additional  guidance. Performed at [facility], [address]  [place], [place] [address]     Chemistries  Recent Labs  Lab 06/19/19 1504  NA 138  K 4.1  CL 105  CO2 25  GLUCOSE 106*  BUN 26*  CREATININE 0.82  CALCIUM 8.8*  AST 17  ALT 17  ALKPHOS 79  BILITOT 0.4   ------------------------------------------------------------------------------------------------------------------  ------------------------------------------------------------------------------------------------------------------ GFR: CrCl cannot be calculated (Unknown ideal weight.). Liver Function Tests: Recent Labs  Lab 06/19/19 1504  AST 17  ALT 17  ALKPHOS 79  BILITOT 0.4  PROT 6.6  ALBUMIN 3.4*   Cardiac Enzymes: Recent Labs  Lab 06/19/19 1504  CKTOTAL 41*    --------------------------------------------------------------------------------------------------------------- Urine analysis:    Component Value Date/Time   COLORURINE STRAW (A) 06/19/2019 1500   APPEARANCEUR CLEAR 06/19/2019 1500   LABSPEC 1.009 06/19/2019 1500   PHURINE 5.0 06/19/2019 1500   GLUCOSEU NEGATIVE 06/19/2019 1500   HGBUR NEGATIVE 06/19/2019 1500   BILIRUBINUR NEGATIVE 06/19/2019 1500   KETONESUR NEGATIVE 06/19/2019 1500   PROTEINUR NEGATIVE 06/19/2019 1500   NITRITE NEGATIVE 06/19/2019 1500   LEUKOCYTESUR NEGATIVE 06/19/2019 1500      Imaging Results:    Ct Head Wo Contrast  Result Date: 06/19/2019 CLINICAL DATA:  Multiple falls, generalized weakness. EXAM: CT HEAD WITHOUT CONTRAST TECHNIQUE: Contiguous axial images were obtained from the base of the skull through the vertex without intravenous contrast. COMPARISON:  None. FINDINGS: Brain: Mild diffuse cortical atrophy is noted. No mass effect or midline shift is noted. Ventricular size is within normal limits. There is no evidence of mass lesion, hemorrhage or acute infarction. Vascular: No hyperdense vessel or unexpected calcification. Skull: Normal. Negative for fracture or focal lesion. Sinuses/Orbits: No acute finding. Other:  None. IMPRESSION: Mild diffuse cortical atrophy. No acute intracranial abnormality seen. Electronically Signed   By: Marijo Conception M.D.   On: 06/19/2019 16:13   Ct Lumbar Spine Wo Contrast  Result Date: 06/19/2019 CLINICAL DATA:  85 year old male with difficulty sitting or standing. Multiple falls. Generalized weakness. EXAM: CT LUMBAR SPINE WITHOUT CONTRAST TECHNIQUE: Multidetector CT imaging of the lumbar spine was performed without intravenous contrast administration. Multiplanar CT image reconstructions were also generated. COMPARISON:  None. FINDINGS: Segmentation: 5 lumbar type vertebrae. Alignment: No acute subluxation. There is straightening of normal lumbar lordosis. There is levoscoliosis  of the lumbar spine with a Cobb angle approximately 11 degrees measured from the superior endplate of K02 to the inferior endplate of L3. No significant listhesis. Vertebrae: The bones are osteopenic. There is no acute fracture. There is L4-L5 and he will oasis as well as bridging osteophyte along the right L1-L3 vertebra. Large anterior osteophyte at L2-L3 and L3-L4 as well as at T12-L1. Paraspinal and other soft tissues: There is atherosclerotic calcification of the aorta. There is calcification of the medial limb of the right adrenal gland, likely sequela of prior infection/inflammation or hemorrhage. Disc levels: Extensive multilevel disc disease with endplate irregularity and disc space narrowing. There is severe disc desiccation and vacuum phenomena at T12-L1. There is diffuse disc bulge asymmetric to the left. There is left posterior osteophyte complex. There is bilateral neural foramina narrowing at this level. Additional multilevel diffuse disc bulge and neural foramina narrowing most severe at L3-L4 and L4-L5. IMPRESSION: 1. No acute/traumatic lumbar spine pathology. 2. Osteopenia with extensive multilevel degenerative changes of the spine. 3. Lumbar levoscoliosis and multilevel disc bulge and neural  foraminal narrowing. MRI may provide better evaluation. Aortic Atherosclerosis (ICD10-I70.0). Electronically Signed   By: Anner Crete M.D.   On: 06/19/2019 16:23   Dg Chest Portable 1 View  Result Date: 06/19/2019 CLINICAL DATA:  Weakness. EXAM: PORTABLE CHEST 1 VIEW COMPARISON:  Radiographs of May 02, 2007. FINDINGS: The heart size and mediastinal contours are within normal limits. No pneumothorax or pleural effusion is noted. Left lung is clear. Minimal right basilar subsegmental atelectasis is noted. The visualized skeletal structures are unremarkable. IMPRESSION: Minimal right basilar subsegmental atelectasis. Electronically Signed   By: Marijo Conception M.D.   On: 06/19/2019 15:16   EKG-normal sinus rhythm, no ST-T changes   Assessment & Plan:    Active Problems:   Falls   1. Recurrent falls-unclear etiology, could be due to medications, patient is on Lasix, Vicodin as needed.  Will hold Vicodin and Lasix at this time.  Will obtain PT OT evaluation in a.m.  CT head was unremarkable.  CT lumbar spine showed multilevel disc disease.  2. Sleep apnea-continue CPAP at bedtime.  3. Hypertension-blood pressure stable, continue lisinopril, amlodipine.  4. GERD-continue PPI    DVT Prophylaxis-   Lovenox   AM Labs Ordered, also please review Full Orders  Family Communication: Admission, patients condition and plan of care including tests being ordered have been discussed with the patient  who indicate understanding and agree with the plan and Code Status.  Code Status: Full code  Admission status: Observation: Based on patients clinical presentation and evaluation of above clinical data, I have made determination that patient will need less than 2 midnight stay in the hospital.  Time spent in minutes : 60 minutes   Oswald Hillock M.D on 06/19/2019 at 8:04 PM

## 2019-06-20 ENCOUNTER — Inpatient Hospital Stay (HOSPITAL_COMMUNITY): Payer: Medicare Other

## 2019-06-20 DIAGNOSIS — M5136 Other intervertebral disc degeneration, lumbar region: Secondary | ICD-10-CM | POA: Diagnosis present

## 2019-06-20 DIAGNOSIS — Z791 Long term (current) use of non-steroidal anti-inflammatories (NSAID): Secondary | ICD-10-CM | POA: Diagnosis not present

## 2019-06-20 DIAGNOSIS — I10 Essential (primary) hypertension: Secondary | ICD-10-CM | POA: Diagnosis present

## 2019-06-20 DIAGNOSIS — I2510 Atherosclerotic heart disease of native coronary artery without angina pectoris: Secondary | ICD-10-CM | POA: Diagnosis present

## 2019-06-20 DIAGNOSIS — J449 Chronic obstructive pulmonary disease, unspecified: Secondary | ICD-10-CM | POA: Diagnosis present

## 2019-06-20 DIAGNOSIS — Z20828 Contact with and (suspected) exposure to other viral communicable diseases: Secondary | ICD-10-CM | POA: Diagnosis present

## 2019-06-20 DIAGNOSIS — E860 Dehydration: Secondary | ICD-10-CM | POA: Diagnosis present

## 2019-06-20 DIAGNOSIS — R296 Repeated falls: Secondary | ICD-10-CM

## 2019-06-20 DIAGNOSIS — Z7982 Long term (current) use of aspirin: Secondary | ICD-10-CM | POA: Diagnosis not present

## 2019-06-20 DIAGNOSIS — R627 Adult failure to thrive: Secondary | ICD-10-CM | POA: Diagnosis present

## 2019-06-20 DIAGNOSIS — J9811 Atelectasis: Secondary | ICD-10-CM | POA: Diagnosis present

## 2019-06-20 DIAGNOSIS — Z87891 Personal history of nicotine dependence: Secondary | ICD-10-CM | POA: Diagnosis not present

## 2019-06-20 DIAGNOSIS — F0390 Unspecified dementia without behavioral disturbance: Secondary | ICD-10-CM | POA: Diagnosis present

## 2019-06-20 DIAGNOSIS — R2681 Unsteadiness on feet: Secondary | ICD-10-CM | POA: Diagnosis present

## 2019-06-20 DIAGNOSIS — M1712 Unilateral primary osteoarthritis, left knee: Secondary | ICD-10-CM | POA: Diagnosis present

## 2019-06-20 DIAGNOSIS — M109 Gout, unspecified: Secondary | ICD-10-CM | POA: Diagnosis present

## 2019-06-20 DIAGNOSIS — K219 Gastro-esophageal reflux disease without esophagitis: Secondary | ICD-10-CM | POA: Diagnosis present

## 2019-06-20 DIAGNOSIS — G4733 Obstructive sleep apnea (adult) (pediatric): Secondary | ICD-10-CM | POA: Diagnosis present

## 2019-06-20 LAB — COMPREHENSIVE METABOLIC PANEL
ALT: 15 U/L (ref 0–44)
AST: 14 U/L — ABNORMAL LOW (ref 15–41)
Albumin: 3.3 g/dL — ABNORMAL LOW (ref 3.5–5.0)
Alkaline Phosphatase: 74 U/L (ref 38–126)
Anion gap: 7 (ref 5–15)
BUN: 24 mg/dL — ABNORMAL HIGH (ref 8–23)
CO2: 25 mmol/L (ref 22–32)
Calcium: 8.8 mg/dL — ABNORMAL LOW (ref 8.9–10.3)
Chloride: 106 mmol/L (ref 98–111)
Creatinine, Ser: 0.8 mg/dL (ref 0.61–1.24)
GFR calc Af Amer: >60 mL/min (ref >60)
GFR calc non Af Amer: >60 mL/min (ref >60)
Glucose, Bld: 108 mg/dL — ABNORMAL HIGH (ref 70–99)
Potassium: 3.9 mmol/L (ref 3.5–5.1)
Sodium: 138 mmol/L (ref 135–145)
Total Bilirubin: 0.7 mg/dL (ref 0.3–1.2)
Total Protein: 6.1 g/dL — ABNORMAL LOW (ref 6.5–8.1)

## 2019-06-20 LAB — GLUCOSE, CAPILLARY
Glucose-Capillary: 159 mg/dL — ABNORMAL HIGH (ref 70–99)
Glucose-Capillary: 106 mg/dL — ABNORMAL HIGH (ref 70–99)
Glucose-Capillary: 96 mg/dL (ref 70–99)
Glucose-Capillary: 100 mg/dL — ABNORMAL HIGH (ref 70–99)

## 2019-06-20 LAB — CBC
HCT: 42.2 % (ref 39.0–52.0)
Hemoglobin: 13.1 g/dL (ref 13.0–17.0)
MCH: 29.4 pg (ref 26.0–34.0)
MCHC: 31 g/dL (ref 30.0–36.0)
MCV: 94.8 fL (ref 80.0–100.0)
Platelets: 306 10*3/uL (ref 150–400)
RBC: 4.45 MIL/uL (ref 4.22–5.81)
RDW: 13.1 % (ref 11.5–15.5)
WBC: 7.7 10*3/uL (ref 4.0–10.5)
nRBC: 0 % (ref 0.0–0.2)

## 2019-06-20 NOTE — Evaluation (Signed)
Occupational Therapy Evaluation Patient Details Name: Eric Bowman MRN: [id number] DOB: Mar 12, 1933 Today's Date: 06/20/2019    History of Present Illness Eric Bowman  is a 85 y.o. male, with history of dementia, CAD, hyperlipidemia, hypertension, sleep apnea on CPAP, was brought to the hospital as patient is having difficulty walking at home.  Over the past 2 weeks patient has as many as 17 falls.  Patient is a poor historian, history obtained from the ED notes.  As per wife patient has not been out of bed for a week.  In the ED CT of the head was unremarkable, CT lumbar spine showed multilevel disc degenerative disease.   Clinical Impression   Pt received supine in bed, agreeable to OT evaluation. Pt reporting he has only been out of bed 1x in the past 2 weeks to use BSC-reports daughter and son-in-law providing heavy assist for transfer. Has had approximately 17 falls in past month, most of which he fell backwards. During session pt reports being lightheaded upon sitting up at EOB, improved in approximately 2-3 minutes. Pt able to perform UB ADLs with set-up at EOB, is unable to performing in standing due to LB weakness. Pt currently requiring significantly increased assistance for ADL completion with inability to stand or maintain standing. Recommend SNF on discharge to improve safety and independence in ADLs and improve strength required for functional task completion.     Follow Up Recommendations  SNF    Equipment Recommendations  None recommended by OT       Precautions / Restrictions Precautions Precautions: Fall Precaution Comments: 17 falls in past month Restrictions Weight Bearing Restrictions: No      Mobility Bed Mobility Overal bed mobility: Needs Assistance Bed Mobility: Supine to Sit;Sit to Supine     Supine to sit: Mod assist Sit to supine: Min assist   General bed mobility comments: HOB elevated  Transfers                 General transfer comment:  Unable to complete        ADL either performed or assessed with clinical judgement   ADL Overall ADL's : Needs assistance/impaired Eating/Feeding: Set up;Bed level Eating/Feeding Details (indicate cue type and reason): Pt unable to open items or cut own food due to arthitis in hands Grooming: Set up;Bed level               Lower Body Dressing: Maximal assistance;Sitting/lateral leans Lower Body Dressing Details (indicate cue type and reason): OT assist for sock adjustment as pt gets dizzy when bending over               General ADL Comments: Pt able to perform ADLs with set-up to max assist while seated at EOB. Unable to stand for ADLs at this time.      Vision Baseline Vision/History: Wears glasses Wears Glasses: Reading only Patient Visual Report: No change from baseline Vision Assessment?: No apparent visual deficits            Pertinent Vitals/Pain Pain Assessment: No/denies pain     Hand Dominance Right   Extremity/Trunk Assessment Upper Extremity Assessment Upper Extremity Assessment: Generalized weakness(BUE ROM 50% at baseline, 3/5 strength in BUE)   Lower Extremity Assessment Lower Extremity Assessment: Defer to PT evaluation       Communication Communication Communication: HOH   Cognition Arousal/Alertness: Awake/alert Behavior During Therapy: WFL for tasks assessed/performed Overall Cognitive Status: Within Functional Limits for tasks assessed  Home Living Family/patient expects to be discharged to:: Private residence Living Arrangements: Spouse/significant other Available Help at Discharge: Family;Available 24 hours/day Type of Home: House Home Access: Level entry     Home Layout: One level     Bathroom Shower/Tub: Tub/shower unit(does not use)   Bathroom Toilet: Standard     Home Equipment: Environmental consultant - 2 wheels;Cane - quad;Bedside commode          Prior  Functioning/Environment Level of Independence: Needs assistance  Gait / Transfers Assistance Needed: Pt uses RW for all mobility, has not been out of bed in 2 weeks due to fear of falling.  ADL's / Homemaking Assistance Needed: Pt and wife assist each other with ADLs. For past 2 weeks wife has been assisting pt at bed level. Pt has been using adult diapers for toileting            OT Problem List: Decreased strength;Decreased activity tolerance;Impaired balance (sitting and/or standing)       End of Session Equipment Utilized During Treatment: Gait belt;Rolling walker  Activity Tolerance: Patient tolerated treatment well Patient left: in bed;with call bell/phone within reach;with bed alarm set;with nursing/sitter in room  OT Visit Diagnosis: Muscle weakness (generalized) (M62.81)                Time: 8250-0370 OT Time Calculation (min): 30 min Charges:  OT General Charges $OT Visit: 1 Visit OT Evaluation $OT Eval Low Complexity: [place], OTR/L  [phone] 06/20/2019, 8:56 AM

## 2019-06-20 NOTE — Plan of Care (Signed)
  Problem: Acute Rehab PT Goals(only PT should resolve) Goal: Pt Will Go Supine/Side To Sit Flowsheets (Taken 06/20/2019 1116) Pt will go Supine/Side to Sit: with minimal assist Goal: Pt Will Go Sit To Supine/Side Flowsheets (Taken 06/20/2019 1116) Pt will go Sit to Supine/Side: with moderate assist Goal: Patient Will Transfer Sit To/From Stand Flowsheets (Taken 06/20/2019 1116) Patient will transfer sit to/from stand: with moderate assist Note: With RW   11:17 AM, 06/20/19 Jerene Pitch, DPT Physical Therapy with [facility]  [phone] office

## 2019-06-20 NOTE — Evaluation (Signed)
Physical Therapy Evaluation Patient Details Name: Eric Bowman MRN: [id number] DOB: November 10, 1932 Today's Date: 06/20/2019   History of Present Illness  Eric Bowman  is a 85 y.o. male, with history of dementia, CAD, hyperlipidemia, hypertension, sleep apnea on CPAP, was brought to the hospital as patient is having difficulty walking at home.  Over the past 2 weeks patient has as many as 17 falls.  Patient is a poor historian, history obtained from the ED notes.  As per wife patient has not been out of bed for a week.  In the ED CT of the head was unremarkable, CT lumbar spine showed multilevel disc degenerative disease.    Clinical Impression  Patient presents with weakness and range of motion deficits that limit his ability to perform bed mobilities and transfers.  Moderate assist needed for bed mobility and max assist for sit to stand transfers. Unable to take steps today or transfer to chair. Patient's wife at home can assist with ADLs but is unable to perform any heavy lifting per patient and his daughter works during the day. Patient would require assistance at home with all transfers and bed mobilities at this time and would benefit from rehab at a skilled nursing facility to improve functional independence prior to return home.  Patient will benefit from skilled physical therapy in hospital and recommended venue below to increase strength, balance, endurance for safe ADLs and gait.    Follow Up Recommendations SNF    Equipment Recommendations       Recommendations for Other Services       Precautions / Restrictions Precautions Precautions: Fall Precaution Comments: 17 falls in past month Restrictions Weight Bearing Restrictions: No      Mobility  Bed Mobility Overal bed mobility: Needs Assistance Bed Mobility: Supine to Sit;Sit to Supine     Supine to sit: Mod assist Sit to supine: Min assist   General bed mobility comments: HOB elevated  Transfers Overall transfer  level: Needs assistance Equipment used: Rolling walker (2 wheeled) Transfers: Sit to/from Stand Sit to Stand: Max assist         General transfer comment: performed 2 times, verbal cues to straighten arms and look up (doesn't extend at hips or trunk)  Ambulation/Gait Ambulation/Gait assistance: (unable to perform)              Stairs            Wheelchair Mobility    Modified Rankin (Stroke Patients Only)       Balance Overall balance assessment: Needs assistance Sitting-balance support: Feet supported;Bilateral upper extremity supported Sitting balance-Leahy Scale: Poor   Postural control: Posterior lean Standing balance support: During functional activity(sit to stand) Standing balance-Leahy Scale: Zero                               Pertinent Vitals/Pain Pain Assessment: Faces Faces Pain Scale: Hurts a little bit Pain Location: in low back with sit to stand transfer Pain Descriptors / Indicators: Discomfort Pain Intervention(s): Limited activity within patient's tolerance    Home Living Family/patient expects to be discharged to:: Private residence Living Arrangements: Spouse/significant other Available Help at Discharge: Family;Available PRN/intermittently(wife unable to lift/assist with transfers, daughter works during the day) Type of Home: House Home Access: Stairs to enter Entrance Stairs-Rails: None Entrance Stairs-Number of Steps: 1 Home Layout: One level Home Equipment: Environmental consultant - 2 wheels;Cane - quad;Bedside commode      Prior Function  Level of Independence: Needs assistance   Gait / Transfers Assistance Needed: Pt uses RW for all mobility, has not been out of bed in 2 weeks due to fear of falling.   ADL's / Homemaking Assistance Needed: Pt and wife assist each other with ADLs. For past 2 weeks wife has been assisting pt at bed level.        Hand Dominance   Dominant Hand: Right    Extremity/Trunk Assessment   Upper  Extremity Assessment Upper Extremity Assessment: Defer to OT evaluation    Lower Extremity Assessment Lower Extremity Assessment: Generalized weakness    Cervical / Trunk Assessment Cervical / Trunk Assessment: Kyphotic  Communication   Communication: HOH  Cognition Arousal/Alertness: Awake/alert Behavior During Therapy: WFL for tasks assessed/performed Overall Cognitive Status: Within Functional Limits for tasks assessed                                        General Comments      Exercises     Assessment/Plan    PT Assessment Patient needs continued PT services  PT Problem List Decreased strength;Decreased range of motion;Pain;Decreased balance;Decreased mobility;Decreased activity tolerance       PT Treatment Interventions Gait training;Functional mobility training;Therapeutic activities;Therapeutic exercise;Patient/family education;Balance training;Neuromuscular re-education    PT Goals (Current goals can be found in the Care Plan section)  Acute Rehab PT Goals Patient Stated Goal: to go home PT Goal Formulation: With patient Time For Goal Achievement: 07/04/19 Potential to Achieve Goals: Fair    Frequency Min 3X/week   Barriers to discharge Decreased caregiver support wife unable to lift or assist with transfers, daughter works during the day    Co-evaluation               AM-PAC PT "6 Clicks" Mobility  Outcome Measure Help needed turning from your back to your side while in a flat bed without using bedrails?: A Little Help needed moving from lying on your back to sitting on the side of a flat bed without using bedrails?: A Lot Help needed moving to and from a bed to a chair (including a wheelchair)?: Total Help needed standing up from a chair using your arms (e.g., wheelchair or bedside chair)?: A Lot Help needed to walk in hospital room?: Total Help needed climbing 3-5 steps with a railing? : Total 6 Click Score: 10    End of  Session Equipment Utilized During Treatment: Gait belt Activity Tolerance: Patient limited by fatigue;Patient limited by pain Patient left: in bed;with call bell/phone within reach;with bed alarm set Nurse Communication: Mobility status;Precautions PT Visit Diagnosis: Other abnormalities of gait and mobility (R26.89);Unsteadiness on feet (R26.81);Repeated falls (R29.6);Muscle weakness (generalized) (M62.81)    Time: 2297-9892 PT Time Calculation (min) (ACUTE ONLY): 29 min   Charges:   PT Evaluation $PT Eval Moderate Complexity: 1 Mod PT Treatments $Therapeutic Activity: 23-37 mins        11:10 AM, 06/20/19 Jerene Pitch, DPT Physical Therapy with [facility]  [phone] office

## 2019-06-20 NOTE — Progress Notes (Signed)
Spoke with patient's wife regarding pt's condition and plan of care. Questions answered. Social Worker in to discuss rehab placement with both patient and wife.

## 2019-06-20 NOTE — Progress Notes (Signed)
Subjective: He was at home and I had arranged home health nursing and physical therapy after his wife told me that he was not able to get up out of bed.  When they evaluated him they were concerned because he had 17 falls in the last 2 weeks has not been able to get out of bed in the home was very cluttered with narrow aisles and they could did not feel that they could get a lift and and felt like he needed to come to the hospital for evaluation.  He was brought to the emergency department.  At baseline he has some dementia obsessive-compulsive disorder severe arthritis of multiple joints sleep apnea and he uses braces on his legs.  Objective: Vital signs in last 24 hours: Temp:  [98.1 F (36.7 C)-98.8 F (37.1 C)] 98.8 F (37.1 C) (08/20 0520) Pulse Rate:  [76-85] 81 (08/20 0520) Resp:  [16-20] 18 (08/20 0520) BP: (108-120)/(51-97) 120/56 (08/20 0520) SpO2:  [95 %-100 %] 98 % (08/20 0520) Weight:  [88.5 kg-89.5 kg] 89.5 kg (08/19 2215) Weight change:  Last BM Date: 06/19/19  Intake/Output from previous day: 08/19 0701 - 08/20 0700 In: 500 [IV Piggyback:500] Out: -   PHYSICAL EXAM General appearance: alert and Mildly confused Resp: rhonchi bilaterally Cardio: regular rate and rhythm, S1, S2 normal, no murmur, click, rub or gallop GI: soft, non-tender; bowel sounds normal; no masses,  no organomegaly Extremities: Chronic arthritic changes bilaterally  Lab Results:  Results for orders placed or performed during the hospital encounter of 06/19/19 (from the past 48 hour(s))  Urinalysis, Routine w reflex microscopic     Status: Abnormal   Collection Time: 06/19/19  3:00 PM  Result Value Ref Range   Color, Urine STRAW (A) YELLOW   APPearance CLEAR CLEAR   Specific Gravity, Urine 1.009 1.005 - 1.030   pH 5.0 5.0 - 8.0   Glucose, UA NEGATIVE NEGATIVE mg/dL   Hgb urine dipstick NEGATIVE NEGATIVE   Bilirubin Urine NEGATIVE NEGATIVE   Ketones, ur NEGATIVE NEGATIVE mg/dL   Protein, ur  NEGATIVE NEGATIVE mg/dL   Nitrite NEGATIVE NEGATIVE   Leukocytes,Ua NEGATIVE NEGATIVE    Comment: Performed at [facility], [address], [place], [place] [address]  Comprehensive metabolic panel     Status: Abnormal   Collection Time: 06/19/19  3:04 PM  Result Value Ref Range   Sodium 138 135 - 145 mmol/L   Potassium 4.1 3.5 - 5.1 mmol/L   Chloride 105 98 - 111 mmol/L   CO2 25 22 - 32 mmol/L   Glucose, Bld 106 (H) 70 - 99 mg/dL   BUN 26 (H) 8 - 23 mg/dL   Creatinine, Ser 0.82 0.61 - 1.24 mg/dL   Calcium 8.8 (L) 8.9 - 10.3 mg/dL   Total Protein 6.6 6.5 - 8.1 g/dL   Albumin 3.4 (L) 3.5 - 5.0 g/dL   AST 17 15 - 41 U/L   ALT 17 0 - 44 U/L   Alkaline Phosphatase 79 38 - 126 U/L   Total Bilirubin 0.4 0.3 - 1.2 mg/dL   GFR calc non Af Amer >60 >60 mL/min   GFR calc Af Amer >60 >60 mL/min   Anion gap 8 5 - 15    Comment: Performed at [facility], [address] [place], [place] [address]  Troponin I (High Sensitivity)     Status: None   Collection Time: 06/19/19  3:04 PM  Result Value Ref Range   Troponin I (High Sensitivity) 4 <18  ng/L    Comment: (NOTE) Elevated high sensitivity troponin I (hsTnI) values and significant  changes across serial measurements may suggest ACS but many other  chronic and acute conditions are known to elevate hsTnI results.  Refer to the "Links" section for chest pain algorithms and additional  guidance. Performed at [facility], [address] [place], [place] [address]   CBC with Differential     Status: Abnormal   Collection Time: 06/19/19  3:04 PM  Result Value Ref Range   WBC 8.4 4.0 - 10.5 K/uL   RBC 4.74 4.22 - 5.81 MIL/uL   Hemoglobin 14.2 13.0 - 17.0 g/dL   HCT 44.4 39.0 - 52.0 %   MCV 93.7 80.0 - 100.0 fL   MCH 30.0 26.0 - 34.0 pg   MCHC 32.0 30.0 - 36.0 g/dL   RDW 13.1 11.5 - 15.5 %   Platelets 317 150 - 400 K/uL   nRBC 0.0 0.0 - 0.2 %   Neutrophils Relative % 65 %   Neutro Abs 5.5 1.7 - 7.7 K/uL   Lymphocytes Relative 18 %    Lymphs Abs 1.5 0.7 - 4.0 K/uL   Monocytes Relative 7 %   Monocytes Absolute 0.6 0.1 - 1.0 K/uL   Eosinophils Relative 8 %   Eosinophils Absolute 0.7 (H) 0.0 - 0.5 K/uL   Basophils Relative 2 %   Basophils Absolute 0.1 0.0 - 0.1 K/uL   Immature Granulocytes 0 %   Abs Immature Granulocytes 0.03 0.00 - 0.07 K/uL    Comment: Performed at [facility], [address] [place], [place] [address]  CK     Status: Abnormal   Collection Time: 06/19/19  3:04 PM  Result Value Ref Range   Total CK 41 (L) 49 - 397 U/L    Comment: Performed at [facility], [address], [place], [place] [address]  Lactic acid, plasma     Status: None   Collection Time: 06/19/19  3:04 PM  Result Value Ref Range   Lactic Acid, Venous 0.9 0.5 - 1.9 mmol/L    Comment: Performed at [facility], [address] [place], [place] [address]  Lactic acid, plasma     Status: None   Collection Time: 06/19/19  5:18 PM  Result Value Ref Range   Lactic Acid, Venous 1.0 0.5 - 1.9 mmol/L    Comment: Performed at [facility], [address], [place], [place] [address]  Troponin I (High Sensitivity)     Status: None   Collection Time: 06/19/19  5:18 PM  Result Value Ref Range   Troponin I (High Sensitivity) 4 <18 ng/L    Comment: (NOTE) Elevated high sensitivity troponin I (hsTnI) values and significant  changes across serial measurements may suggest ACS but many other  chronic and acute conditions are known to elevate hsTnI results.  Refer to the "Links" section for chest pain algorithms and additional  guidance. Performed at [facility], [address], [place], [place] [address]   SARS Coronavirus 2 [facility] order, Performed in [facility] hospital lab) Nasopharyngeal Nasopharyngeal Swab     Status: None   Collection Time: 06/19/19  7:56 PM   Specimen: Nasopharyngeal Swab  Result Value Ref Range   SARS Coronavirus 2 NEGATIVE NEGATIVE    Comment: (NOTE) If result is NEGATIVE SARS-CoV-2 target nucleic acids are NOT  DETECTED. The SARS-CoV-2 RNA is generally detectable in upper and lower  respiratory specimens during the acute phase of infection. The lowest  concentration of SARS-CoV-2  viral copies this assay can detect is 250  copies / mL. A negative result does not preclude SARS-CoV-2 infection  and should not be used as the sole basis for treatment or other  patient management decisions.  A negative result may occur with  improper specimen collection / handling, submission of specimen other  than nasopharyngeal swab, presence of viral mutation(s) within the  areas targeted by this assay, and inadequate number of viral copies  (<250 copies / mL). A negative result must be combined with clinical  observations, patient history, and epidemiological information. If result is POSITIVE SARS-CoV-2 target nucleic acids are DETECTED. The SARS-CoV-2 RNA is generally detectable in upper and lower  respiratory specimens dur ing the acute phase of infection.  Positive  results are indicative of active infection with SARS-CoV-2.  Clinical  correlation with patient history and other diagnostic information is  necessary to determine patient infection status.  Positive results do  not rule out bacterial infection or co-infection with other viruses. If result is PRESUMPTIVE POSTIVE SARS-CoV-2 nucleic acids MAY BE PRESENT.   A presumptive positive result was obtained on the submitted specimen  and confirmed on repeat testing.  While 2019 novel coronavirus  (SARS-CoV-2) nucleic acids may be present in the submitted sample  additional confirmatory testing may be necessary for epidemiological  and / or clinical management purposes  to differentiate between  SARS-CoV-2 and other Sarbecovirus currently known to infect humans.  If clinically indicated additional testing with an alternate test  methodology [phone]) is advised. The SARS-CoV-2 RNA is generally  detectable in upper and lower respiratory sp ecimens during  the acute  phase of infection. The expected result is Negative. Fact Sheet for Patients:  StrictlyIdeas.no Fact Sheet for Healthcare Providers: BankingDealers.co.za This test is not yet approved or cleared by the [place] FDA and has been authorized for detection and/or diagnosis of SARS-CoV-2 by FDA under an Emergency Use Authorization (EUA).  This EUA will remain in effect (meaning this test can be used) for the duration of the COVID-19 declaration under Section 564(b)(1) of the Act, 21 U.S.C. section 360bbb-3(b)(1), unless the authorization is terminated or revoked sooner. Performed at [facility], [address] [place], [place] [address]   CBC     Status: None   Collection Time: 06/20/19  6:34 AM  Result Value Ref Range   WBC 7.7 4.0 - 10.5 K/uL   RBC 4.45 4.22 - 5.81 MIL/uL   Hemoglobin 13.1 13.0 - 17.0 g/dL   HCT 42.2 39.0 - 52.0 %   MCV 94.8 80.0 - 100.0 fL   MCH 29.4 26.0 - 34.0 pg   MCHC 31.0 30.0 - 36.0 g/dL   RDW 13.1 11.5 - 15.5 %   Platelets 306 150 - 400 K/uL   nRBC 0.0 0.0 - 0.2 %    Comment: Performed at [facility], [address] [place], [place] [address]  Comprehensive metabolic panel     Status: Abnormal   Collection Time: 06/20/19  6:34 AM  Result Value Ref Range   Sodium 138 135 - 145 mmol/L   Potassium 3.9 3.5 - 5.1 mmol/L   Chloride 106 98 - 111 mmol/L   CO2 25 22 - 32 mmol/L   Glucose, Bld 108 (H) 70 - 99 mg/dL   BUN 24 (H) 8 - 23 mg/dL   Creatinine, Ser 0.80 0.61 - 1.24 mg/dL   Calcium 8.8 (L) 8.9 - 10.3 mg/dL   Total Protein 6.1 (L) 6.5 - 8.1 g/dL  Albumin 3.3 (L) 3.5 - 5.0 g/dL   AST 14 (L) 15 - 41 U/L   ALT 15 0 - 44 U/L   Alkaline Phosphatase 74 38 - 126 U/L   Total Bilirubin 0.7 0.3 - 1.2 mg/dL   GFR calc non Af Amer >60 >60 mL/min   GFR calc Af Amer >60 >60 mL/min   Anion gap 7 5 - 15    Comment: Performed at [facility], [address], [place], [place] [address]  Glucose,  capillary     Status: Abnormal   Collection Time: 06/20/19  7:52 AM  Result Value Ref Range   Glucose-Capillary 159 (H) 70 - 99 mg/dL    ABGS No results for input(s): PHART, PO2ART, TCO2, HCO3 in the last 72 hours.  Invalid input(s): PCO2 CULTURES Recent Results (from the past 240 hour(s))  SARS Coronavirus 2 [facility] order, Performed in [facility] hospital lab) Nasopharyngeal Nasopharyngeal Swab     Status: None   Collection Time: 06/19/19  7:56 PM   Specimen: Nasopharyngeal Swab  Result Value Ref Range Status   SARS Coronavirus 2 NEGATIVE NEGATIVE Final    Comment: (NOTE) If result is NEGATIVE SARS-CoV-2 target nucleic acids are NOT DETECTED. The SARS-CoV-2 RNA is generally detectable in upper and lower  respiratory specimens during the acute phase of infection. The lowest  concentration of SARS-CoV-2 viral copies this assay can detect is 250  copies / mL. A negative result does not preclude SARS-CoV-2 infection  and should not be used as the sole basis for treatment or other  patient management decisions.  A negative result may occur with  improper specimen collection / handling, submission of specimen other  than nasopharyngeal swab, presence of viral mutation(s) within the  areas targeted by this assay, and inadequate number of viral copies  (<250 copies / mL). A negative result must be combined with clinical  observations, patient history, and epidemiological information. If result is POSITIVE SARS-CoV-2 target nucleic acids are DETECTED. The SARS-CoV-2 RNA is generally detectable in upper and lower  respiratory specimens dur ing the acute phase of infection.  Positive  results are indicative of active infection with SARS-CoV-2.  Clinical  correlation with patient history and other diagnostic information is  necessary to determine patient infection status.  Positive results do  not rule out bacterial infection or co-infection with other viruses. If result is  PRESUMPTIVE POSTIVE SARS-CoV-2 nucleic acids MAY BE PRESENT.   A presumptive positive result was obtained on the submitted specimen  and confirmed on repeat testing.  While 2019 novel coronavirus  (SARS-CoV-2) nucleic acids may be present in the submitted sample  additional confirmatory testing may be necessary for epidemiological  and / or clinical management purposes  to differentiate between  SARS-CoV-2 and other Sarbecovirus currently known to infect humans.  If clinically indicated additional testing with an alternate test  methodology [phone]) is advised. The SARS-CoV-2 RNA is generally  detectable in upper and lower respiratory sp ecimens during the acute  phase of infection. The expected result is Negative. Fact Sheet for Patients:  StrictlyIdeas.no Fact Sheet for Healthcare Providers: BankingDealers.co.za This test is not yet approved or cleared by the [place] FDA and has been authorized for detection and/or diagnosis of SARS-CoV-2 by FDA under an Emergency Use Authorization (EUA).  This EUA will remain in effect (meaning this test can be used) for the duration of the COVID-19 declaration under Section 564(b)(1) of the Act, 21 U.S.C. section 360bbb-3(b)(1), unless the authorization is  terminated or revoked sooner. Performed at [facility], [address] [place], [place] [address]    Studies/Results: Ct Head Wo Contrast  Result Date: 06/19/2019 CLINICAL DATA:  Multiple falls, generalized weakness. EXAM: CT HEAD WITHOUT CONTRAST TECHNIQUE: Contiguous axial images were obtained from the base of the skull through the vertex without intravenous contrast. COMPARISON:  None. FINDINGS: Brain: Mild diffuse cortical atrophy is noted. No mass effect or midline shift is noted. Ventricular size is within normal limits. There is no evidence of mass lesion, hemorrhage or acute infarction. Vascular: No hyperdense vessel or unexpected  calcification. Skull: Normal. Negative for fracture or focal lesion. Sinuses/Orbits: No acute finding. Other: None. IMPRESSION: Mild diffuse cortical atrophy. No acute intracranial abnormality seen. Electronically Signed   By: Marijo Conception M.D.   On: 06/19/2019 16:13   Ct Lumbar Spine Wo Contrast  Result Date: 06/19/2019 CLINICAL DATA:  85 year old male with difficulty sitting or standing. Multiple falls. Generalized weakness. EXAM: CT LUMBAR SPINE WITHOUT CONTRAST TECHNIQUE: Multidetector CT imaging of the lumbar spine was performed without intravenous contrast administration. Multiplanar CT image reconstructions were also generated. COMPARISON:  None. FINDINGS: Segmentation: 5 lumbar type vertebrae. Alignment: No acute subluxation. There is straightening of normal lumbar lordosis. There is levoscoliosis of the lumbar spine with a Cobb angle approximately 11 degrees measured from the superior endplate of 624THL to the inferior endplate of L3. No significant listhesis. Vertebrae: The bones are osteopenic. There is no acute fracture. There is L4-L5 and he will oasis as well as bridging osteophyte along the right L1-L3 vertebra. Large anterior osteophyte at L2-L3 and L3-L4 as well as at T12-L1. Paraspinal and other soft tissues: There is atherosclerotic calcification of the aorta. There is calcification of the medial limb of the right adrenal gland, likely sequela of prior infection/inflammation or hemorrhage. Disc levels: Extensive multilevel disc disease with endplate irregularity and disc space narrowing. There is severe disc desiccation and vacuum phenomena at T12-L1. There is diffuse disc bulge asymmetric to the left. There is left posterior osteophyte complex. There is bilateral neural foramina narrowing at this level. Additional multilevel diffuse disc bulge and neural foramina narrowing most severe at L3-L4 and L4-L5. IMPRESSION: 1. No acute/traumatic lumbar spine pathology. 2. Osteopenia with extensive  multilevel degenerative changes of the spine. 3. Lumbar levoscoliosis and multilevel disc bulge and neural foraminal narrowing. MRI may provide better evaluation. Aortic Atherosclerosis (ICD10-I70.0). Electronically Signed   By: Anner Crete M.D.   On: 06/19/2019 16:23   Dg Chest Portable 1 View  Result Date: 06/19/2019 CLINICAL DATA:  Weakness. EXAM: PORTABLE CHEST 1 VIEW COMPARISON:  Radiographs of May 02, 2007. FINDINGS: The heart size and mediastinal contours are within normal limits. No pneumothorax or pleural effusion is noted. Left lung is clear. Minimal right basilar subsegmental atelectasis is noted. The visualized skeletal structures are unremarkable. IMPRESSION: Minimal right basilar subsegmental atelectasis. Electronically Signed   By: Marijo Conception M.D.   On: 06/19/2019 15:16    Medications:  Prior to Admission:  Medications Prior to Admission  Medication Sig Dispense Refill Last Dose  . acetaminophen (TYLENOL) 500 MG tablet Take 500-1,000 mg by mouth every 6 (six) hours as needed. For pain     . allopurinol (ZYLOPRIM) 300 MG tablet Take 300 mg by mouth daily.     06/19/2019 at Unknown time  . amLODipine (NORVASC) 10 MG tablet Take 10 mg by mouth daily.     06/19/2019 at Unknown time  . aspirin EC 81 MG tablet  Take 81 mg by mouth daily.   06/19/2019 at 900  . donepezil (ARICEPT) 10 MG tablet Take 10 mg by mouth daily.   06/19/2019 at Unknown time  . furosemide (LASIX) 40 MG tablet Take 40 mg by mouth daily.     06/19/2019 at Unknown time  . HYDROcodone-acetaminophen (NORCO/VICODIN) 5-325 MG per tablet Take one-two tabs po q 4-6 hrs prn pain 6 tablet 0 06/19/2019 at Unknown time  . lisinopril (PRINIVIL,ZESTRIL) 20 MG tablet Take 20 mg by mouth daily.     06/19/2019 at Unknown time  . meloxicam (MOBIC) 7.5 MG tablet Take 7.5 mg by mouth daily.     06/19/2019 at Unknown time  . Multiple Vitamin (MULITIVITAMIN WITH MINERALS) TABS Take 1 tablet by mouth daily.   06/19/2019 at Unknown time   . omeprazole (PRILOSEC) 20 MG capsule Take 20 mg by mouth daily.     06/19/2019 at Unknown time  . potassium chloride SA (K-DUR,KLOR-CON) 20 MEQ tablet Take 20 mEq by mouth daily.     06/19/2019 at Unknown time  . simvastatin (ZOCOR) 20 MG tablet Take 20 mg by mouth at bedtime.     06/19/2019 at Unknown time  . nitroGLYCERIN (NITROSTAT) 0.4 MG SL tablet Place 1 tablet (0.4 mg total) under the tongue every 5 (five) minutes as needed for chest pain. 25 tablet 3    Scheduled: . allopurinol  300 mg Oral Daily  . amLODipine  10 mg Oral Daily  . aspirin EC  81 mg Oral Daily  . donepezil  10 mg Oral Daily  . enoxaparin (LOVENOX) injection  40 mg Subcutaneous Q24H  . lisinopril  20 mg Oral Daily  . meloxicam  7.5 mg Oral Daily  . pantoprazole  40 mg Oral Daily  . simvastatin  20 mg Oral QHS   Continuous: . sodium chloride     [id number] **OR** ondansetron (ZOFRAN) IV  Assesment: He is admitted with multiple falls.  He is very weak.  I think he is probably dehydrated.  He may be having orthostatic changes.  Recommendation for MRI is noted and I will order that.  He has COPD at baseline and has atelectasis on his chest x-Sheri  He has severe problems with his legs which is an ongoing issue but is worse Active Problems:   Falls    Plan: Full admission.  Start IV fluids.  MRI of back    LOS: 0 days   Alonza Bogus 06/20/2019, 7:59 AM

## 2019-06-20 NOTE — Progress Notes (Signed)
Patient is refusing the use of CPAP at this time. Patient states he wears one at home but does not want to wear one while he is here. RT educated patient and patient is aware to have RN contact RT if he changes his mind.

## 2019-06-20 NOTE — Plan of Care (Signed)

## 2019-06-20 NOTE — TOC Initial Note (Signed)
Transition of Care [facility]) - Initial/Assessment Note    Patient Details  Name: Eric Bowman MRN: [id number] Date of Birth: 03/22/1933  Transition of Care [facility]) CM/SW Contact:    Trish Mage, LCSW Phone Number: 06/20/2019, 12:36 PM  Clinical Narrative:     Eric Bowman is an 85 YO Caucasian male admitted for weakness.  He has been experiencing a significant number of falls, and consequently has been staying in bed so as to reduce his falls, thus increasing his weakness.  He lives at home with his wife, who was at bedside, uses a cane, and was clear that she is not able to help lift patient. The only other support she cited was an "adoptive daughter" who checks on couple regularly. DME at home includes walker, cane and BSC.   Eric Bowman is open to going to rehab.  He would prefer to stay in [place], and has asked for his information to be sent to [facility] and [place]. PASSR number secured. CMS list given to patient and wife.  Information sent to facilities. TOC will continue to follow.                                               Expected Discharge Plan: Skilled Nursing Facility Barriers to Discharge: No Barriers Identified   Patient Goals and CMS Choice Patient states their goals for this hospitalization and ongoing recovery are:: "I'm willing to go to rehab if it is what is recommended." CMS Medicare.gov Compare Post Acute Care list provided to:: Patient Choice offered to / list presented to : Patient  Expected Discharge Plan and Services Expected Discharge Plan: [place]   Discharge Planning Services: CM Consult Post Acute Care Choice: [place] Living arrangements for the past 2 months: Single Family Home Expected Discharge Date: 06/22/19                                    Prior Living Arrangements/Services Living arrangements for the past 2 months: Single Family Home Lives with:: Spouse Patient language and need for interpreter reviewed::  Yes Do you feel safe going back to the place where you live?: Yes      Need for Family Participation in Patient Care: Yes (Comment) Care giver support system in place?: Yes (comment) Current home services: DME Criminal Activity/Legal Involvement Pertinent to Current Situation/Hospitalization: No - Comment as needed  Activities of Daily Living Home Assistive Devices/Equipment: None ADL Screening (condition at time of admission) Patient's cognitive ability adequate to safely complete daily activities?: No Is the patient deaf or have difficulty hearing?: Yes Does the patient have difficulty seeing, even when wearing glasses/contacts?: No Does the patient have difficulty concentrating, remembering, or making decisions?: Yes Patient able to express need for assistance with ADLs?: Yes Does the patient have difficulty dressing or bathing?: Yes Independently performs ADLs?: No Communication: Independent Dressing (OT): Needs assistance Is this a change from baseline?: Pre-admission baseline Grooming: Needs assistance Is this a change from baseline?: Pre-admission baseline Feeding: Needs assistance Is this a change from baseline?: Pre-admission baseline Bathing: Needs assistance Is this a change from baseline?: Pre-admission baseline Toileting: Needs assistance Is this a change from baseline?: Pre-admission baseline In/Out Bed: Dependent Is this a change from baseline?: Pre-admission baseline Walks in Home: Dependent Is  this a change from baseline?: Pre-admission baseline Does the patient have difficulty walking or climbing stairs?: Yes Weakness of Legs: Both Weakness of Arms/Hands: None  Permission Sought/Granted Permission sought to share information with : Family Supports Permission granted to share information with : Yes, Verbal Permission Granted  Share Information with NAME: Mrs Lautner     Permission granted to share info w Relationship: spouse  Permission granted to share info  w Contact Information: [phone]  Emotional Assessment Appearance:: Appears stated age Attitude/Demeanor/Rapport: Engaged Affect (typically observed): Appropriate Orientation: : Oriented to Self, Oriented to Place, Oriented to Situation Alcohol / Substance Use: Not Applicable Psych Involvement: No (comment)  Admission diagnosis:  Gait instability [R26.81] Frequent falls [R29.6] Patient Active Problem List   Diagnosis Date Noted  . Multiple falls 06/20/2019  . Falls 06/19/2019  . Arthritis of knee, left 01/03/2013  . Leg weakness, bilateral 01/03/2013  . Degenerative disc disease, lumbar 01/03/2013  . Coronary artery disease 09/13/2011  . EOSINOPHILIA 10/08/2008  . LEUKOCYTOSIS UNSPECIFIED 09/04/2008  . HYPERGLYCEMIA 09/04/2008  . SLEEP APNEA, OBSTRUCTIVE 09/10/2007  . RHINITIS, CHRONIC 05/16/2007  . 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 HYPERLIPIDEMIA 05/02/2007  . HYPERTENSION 05/02/2007  . BRONCHITIS, CHRONIC NOS 05/02/2007  . GERD 05/02/2007  . IRRITABLE BOWEL SYNDROME 05/02/2007  . DYSPNEA 05/02/2007   PCP:  Sinda Du, MD Pharmacy:   [place], [place] - [address] [place] [address] [place] [address] [place] [address] Phone: [phone] Fax: [phone]  [place], [place] [address] Phone: [phone] Fax: [phone]     Social Determinants of Health (SDOH) Interventions    Readmission Risk Interventions No flowsheet data found.

## 2019-06-20 NOTE — NC FL2 (Signed)
[place] LEVEL OF CARE SCREENING TOOL     IDENTIFICATION  Patient Name: Eric Bowman Birthdate: Aug 23, 1933 Sex: male Admission Date (Current Location): 06/19/2019  [facility] and [place] Number:  [employer] and Address:  [place] [address], [place]      Provider Number: [id number]  Attending Physician Name and Address:  Sinda Du, MD  Relative Name and Phone Number:       Current Level of Care: Hospital Recommended Level of Care: [place] Prior Approval Number:    Date Approved/Denied:   PASRR Number: [id number]  Discharge Plan: SNF    Current Diagnoses: Patient Active Problem List   Diagnosis Date Noted  . Multiple falls 06/20/2019  . Falls 06/19/2019  . Arthritis of knee, left 01/03/2013  . Leg weakness, bilateral 01/03/2013  . Degenerative disc disease, lumbar 01/03/2013  . Coronary artery disease 09/13/2011  . EOSINOPHILIA 10/08/2008  . LEUKOCYTOSIS UNSPECIFIED 09/04/2008  . HYPERGLYCEMIA 09/04/2008  . SLEEP APNEA, OBSTRUCTIVE 09/10/2007  . RHINITIS, CHRONIC 05/16/2007  . 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 HYPERLIPIDEMIA 05/02/2007  . HYPERTENSION 05/02/2007  . BRONCHITIS, CHRONIC NOS 05/02/2007  . GERD 05/02/2007  . IRRITABLE BOWEL SYNDROME 05/02/2007  . DYSPNEA 05/02/2007    Orientation RESPIRATION BLADDER Height & Weight     Self, Situation, Place  Normal Continent Weight: 89.5 kg Height:  5\' 9"  (175.3 cm)  BEHAVIORAL SYMPTOMS/MOOD NEUROLOGICAL BOWEL NUTRITION STATUS  (none) (none) Continent Diet(regular)  AMBULATORY STATUS COMMUNICATION OF NEEDS Skin   Extensive Assist Verbally Normal                       Personal Care Assistance Level of Assistance  Bathing, Feeding, Dressing Bathing Assistance: Maximum assistance Feeding assistance: Independent Dressing Assistance: Maximum assistance     Functional Limitations Info  Sight, Hearing, Speech Sight  Info: Adequate Hearing Info: Adequate Speech Info: Adequate    SPECIAL CARE FACTORS FREQUENCY  PT (By licensed PT)     PT Frequency: 5X/W              Contractures Contractures Info: Not present    Additional Factors Info  Code Status, Allergies Code Status Info: full Allergies Info: Penicillins, Sulfonamide Derivatives, Tetanus Toxoid           Current Medications (06/20/2019):  This is the current hospital active medication list Current Facility-Administered Medications  Medication Dose Route Frequency Provider Last Rate Last Dose  . 0.9 %  sodium chloride infusion   Intravenous Continuous Sinda Du, MD      . allopurinol (ZYLOPRIM) tablet 300 mg  300 mg Oral Daily Darrick Meigs, Gagan S, MD      . amLODipine (NORVASC) tablet 10 mg  10 mg Oral Daily Oswald Hillock, MD      . aspirin EC tablet 81 mg  81 mg Oral Daily Darrick Meigs, Gagan S, MD      . donepezil (ARICEPT) tablet 10 mg  10 mg Oral Daily Oswald Hillock, MD      . enoxaparin (LOVENOX) injection 40 mg  40 mg Subcutaneous Q24H Oswald Hillock, MD   40 mg at 06/19/19 2230  . lisinopril (ZESTRIL) tablet 20 mg  20 mg Oral Daily [place], Marge Duncans, MD      . meloxicam (MOBIC) tablet 7.5 mg  7.5 mg Oral Daily Darrick Meigs, Marge Duncans, MD      . ondansetron [facility]) tablet 4 mg  4 mg Oral  Q6H PRN Oswald Hillock, MD       Or  . ondansetron (ZOFRAN) injection 4 mg  4 mg Intravenous Q6H PRN Darrick Meigs, Marge Duncans, MD      . pantoprazole (PROTONIX) EC tablet 40 mg  40 mg Oral Daily Darrick Meigs, Gagan S, MD      . simvastatin (ZOCOR) tablet 20 mg  20 mg Oral QHS Oswald Hillock, MD   20 mg at 06/19/19 2230     Discharge Medications: Please see discharge summary for a list of discharge medications.  Relevant Imaging Results:  Relevant Lab Results:   Additional Information [place], [place]

## 2019-06-21 LAB — BASIC METABOLIC PANEL
Anion gap: 7 (ref 5–15)
BUN: 19 mg/dL (ref 8–23)
CO2: 24 mmol/L (ref 22–32)
Calcium: 8.5 mg/dL — ABNORMAL LOW (ref 8.9–10.3)
Chloride: 105 mmol/L (ref 98–111)
Creatinine, Ser: 0.65 mg/dL (ref 0.61–1.24)
GFR calc Af Amer: >60 mL/min (ref >60)
GFR calc non Af Amer: >60 mL/min (ref >60)
Glucose, Bld: 94 mg/dL (ref 70–99)
Potassium: 3.6 mmol/L (ref 3.5–5.1)
Sodium: 136 mmol/L (ref 135–145)

## 2019-06-21 LAB — GLUCOSE, CAPILLARY
Glucose-Capillary: 112 mg/dL — ABNORMAL HIGH (ref 70–99)
Glucose-Capillary: 103 mg/dL — ABNORMAL HIGH (ref 70–99)

## 2019-06-21 LAB — URINE CULTURE
Culture: NO GROWTH
Special Requests: NORMAL

## 2019-06-21 MED ORDER — ACETAMINOPHEN 325 MG PO TABS
650.0000 mg | ORAL_TABLET | ORAL | Status: DC | PRN
  Administered 2019-06-21: 650 mg via ORAL
  Filled 2019-06-21: qty 2

## 2019-06-21 NOTE — TOC Progression Note (Signed)
Transition of Care [facility]) - Progression Note    Patient Details  Name: Eric Bowman MRN: [id number] Date of Birth: [birth year]  Transition of Care [facility]) CM/SW Contact  Yani Lal, Chauncey Reading, RN Phone Number: 06/21/2019, 10:48 AM  Clinical Narrative:   Talked with Marianna Fuss of [facility], patient has been accepted and insurance authorization has been obtained. Patient can DC to [facility] this weekend if becomes medically stable.     Expected Discharge Plan: [place] Barriers to Discharge: No Barriers Identified  Expected Discharge Plan and Services Expected Discharge Plan: [place]   Discharge Planning Services: CM Consult Post Acute Care Choice: [place] Living arrangements for the past 2 months: Single Family Home Expected Discharge Date: 06/22/19                                     Social Determinants of Health (SDOH) Interventions    Readmission Risk Interventions No flowsheet data found.

## 2019-06-21 NOTE — Care Management Important Message (Signed)
Important Message  Patient Details  Name: Eric Bowman MRN: [id number] Date of Birth: 1933-06-12   Medicare Important Message Given:  Yes     Tommy Medal 06/21/2019, 2:20 PM

## 2019-06-21 NOTE — Progress Notes (Signed)
Subjective: He says he feels okay.  He is aware of the plans for skilled care facility.  He is still very weak.  He has been evaluated by PT and OT and both recommend skilled care facility.  He was unable to do MRI because of his poliosis and inability to lie flat.  Objective: Vital signs in last 24 hours: Temp:  [97.7 F (36.5 C)-98.2 F (36.8 C)] 98.2 F (36.8 C) (08/21 0521) Pulse Rate:  [71-73] 71 (08/21 0521) Resp:  [17-18] 17 (08/21 0521) BP: (99-121)/(52-61) 99/61 (08/21 0521) SpO2:  [97 %-100 %] 100 % (08/21 0521) Weight change:  Last BM Date: 06/19/19  Intake/Output from previous day: 08/20 0701 - 08/21 0700 In: 2205.9 [P.O.:1200; I.V.:1005.9] Out: 601 [Urine:600; Stool:1]  PHYSICAL EXAM General appearance: alert, cooperative and no distress Resp: clear to auscultation bilaterally Cardio: regular rate and rhythm, S1, S2 normal, no murmur, click, rub or gallop GI: soft, non-tender; bowel sounds normal; no masses,  no organomegaly Extremities: extremities normal, atraumatic, no cyanosis or edema  Lab Results:  Results for orders placed or performed during the hospital encounter of 06/19/19 (from the past 48 hour(s))  Urinalysis, Routine w reflex microscopic     Status: Abnormal   Collection Time: 06/19/19  3:00 PM  Result Value Ref Range   Color, Urine STRAW (A) YELLOW   APPearance CLEAR CLEAR   Specific Gravity, Urine 1.009 1.005 - 1.030   pH 5.0 5.0 - 8.0   Glucose, UA NEGATIVE NEGATIVE mg/dL   Hgb urine dipstick NEGATIVE NEGATIVE   Bilirubin Urine NEGATIVE NEGATIVE   Ketones, ur NEGATIVE NEGATIVE mg/dL   Protein, ur NEGATIVE NEGATIVE mg/dL   Nitrite NEGATIVE NEGATIVE   Leukocytes,Ua NEGATIVE NEGATIVE    Comment: Performed at [facility], [address] [place], [place] [address]  Comprehensive metabolic panel     Status: Abnormal   Collection Time: 06/19/19  3:04 PM  Result Value Ref Range   Sodium 138 135 - 145 mmol/L   Potassium 4.1 3.5 - 5.1 mmol/L    Chloride 105 98 - 111 mmol/L   CO2 25 22 - 32 mmol/L   Glucose, Bld 106 (H) 70 - 99 mg/dL   BUN 26 (H) 8 - 23 mg/dL   Creatinine, Ser 0.82 0.61 - 1.24 mg/dL   Calcium 8.8 (L) 8.9 - 10.3 mg/dL   Total Protein 6.6 6.5 - 8.1 g/dL   Albumin 3.4 (L) 3.5 - 5.0 g/dL   AST 17 15 - 41 U/L   ALT 17 0 - 44 U/L   Alkaline Phosphatase 79 38 - 126 U/L   Total Bilirubin 0.4 0.3 - 1.2 mg/dL   GFR calc non Af Amer >60 >60 mL/min   GFR calc Af Amer >60 >60 mL/min   Anion gap 8 5 - 15    Comment: Performed at [facility], [address], [place], [place] [address]  Troponin I (High Sensitivity)     Status: None   Collection Time: 06/19/19  3:04 PM  Result Value Ref Range   Troponin I (High Sensitivity) 4 <18 ng/L    Comment: (NOTE) Elevated high sensitivity troponin I (hsTnI) values and significant  changes across serial measurements may suggest ACS but many other  chronic and acute conditions are known to elevate hsTnI results.  Refer to the "Links" section for chest pain algorithms and additional  guidance. Performed at [facility], [address] [place], [place] [address]   CBC with Differential  Status: Abnormal   Collection Time: 06/19/19  3:04 PM  Result Value Ref Range   WBC 8.4 4.0 - 10.5 K/uL   RBC 4.74 4.22 - 5.81 MIL/uL   Hemoglobin 14.2 13.0 - 17.0 g/dL   HCT 44.4 39.0 - 52.0 %   MCV 93.7 80.0 - 100.0 fL   MCH 30.0 26.0 - 34.0 pg   MCHC 32.0 30.0 - 36.0 g/dL   RDW 13.1 11.5 - 15.5 %   Platelets 317 150 - 400 K/uL   nRBC 0.0 0.0 - 0.2 %   Neutrophils Relative % 65 %   Neutro Abs 5.5 1.7 - 7.7 K/uL   Lymphocytes Relative 18 %   Lymphs Abs 1.5 0.7 - 4.0 K/uL   Monocytes Relative 7 %   Monocytes Absolute 0.6 0.1 - 1.0 K/uL   Eosinophils Relative 8 %   Eosinophils Absolute 0.7 (H) 0.0 - 0.5 K/uL   Basophils Relative 2 %   Basophils Absolute 0.1 0.0 - 0.1 K/uL   Immature Granulocytes 0 %   Abs Immature Granulocytes 0.03 0.00 - 0.07 K/uL    Comment: Performed at [facility], [address] [place], [place] [address]  CK     Status: Abnormal   Collection Time: 06/19/19  3:04 PM  Result Value Ref Range   Total CK 41 (L) 49 - 397 U/L    Comment: Performed at [facility], [address], [place], [place] [address]  Lactic acid, plasma     Status: None   Collection Time: 06/19/19  3:04 PM  Result Value Ref Range   Lactic Acid, Venous 0.9 0.5 - 1.9 mmol/L    Comment: Performed at [facility], [address] [place], [place] [address]  Lactic acid, plasma     Status: None   Collection Time: 06/19/19  5:18 PM  Result Value Ref Range   Lactic Acid, Venous 1.0 0.5 - 1.9 mmol/L    Comment: Performed at [facility], [address], [place], [place] [address]  Troponin I (High Sensitivity)     Status: None   Collection Time: 06/19/19  5:18 PM  Result Value Ref Range   Troponin I (High Sensitivity) 4 <18 ng/L    Comment: (NOTE) Elevated high sensitivity troponin I (hsTnI) values and significant  changes across serial measurements may suggest ACS but many other  chronic and acute conditions are known to elevate hsTnI results.  Refer to the "Links" section for chest pain algorithms and additional  guidance. Performed at [facility], [address], [place],  [address]   SARS Coronavirus 2 [facility] order, Performed in [facility] hospital lab) Nasopharyngeal Nasopharyngeal Swab     Status: None   Collection Time: 06/19/19  7:56 PM   Specimen: Nasopharyngeal Swab  Result Value Ref Range   SARS Coronavirus 2 NEGATIVE NEGATIVE    Comment: (NOTE) If result is NEGATIVE SARS-CoV-2 target nucleic acids are NOT DETECTED. The SARS-CoV-2 RNA is generally detectable in upper and lower  respiratory specimens during the acute phase of infection. The lowest  concentration of SARS-CoV-2 viral copies this assay can detect is 250  copies / mL. A negative result does not preclude SARS-CoV-2 infection  and should not be used as the sole basis for treatment or other   patient management decisions.  A negative result may occur with  improper specimen collection / handling, submission of specimen other  than nasopharyngeal swab, presence of viral mutation(s) within the  areas targeted by this assay,  and inadequate number of viral copies  (<250 copies / mL). A negative result must be combined with clinical  observations, patient history, and epidemiological information. If result is POSITIVE SARS-CoV-2 target nucleic acids are DETECTED. The SARS-CoV-2 RNA is generally detectable in upper and lower  respiratory specimens dur ing the acute phase of infection.  Positive  results are indicative of active infection with SARS-CoV-2.  Clinical  correlation with patient history and other diagnostic information is  necessary to determine patient infection status.  Positive results do  not rule out bacterial infection or co-infection with other viruses. If result is PRESUMPTIVE POSTIVE SARS-CoV-2 nucleic acids MAY BE PRESENT.   A presumptive positive result was obtained on the submitted specimen  and confirmed on repeat testing.  While 2019 novel coronavirus  (SARS-CoV-2) nucleic acids may be present in the submitted sample  additional confirmatory testing may be necessary for epidemiological  and / or clinical management purposes  to differentiate between  SARS-CoV-2 and other Sarbecovirus currently known to infect humans.  If clinically indicated additional testing with an alternate test  methodology [phone]) is advised. The SARS-CoV-2 RNA is generally  detectable in upper and lower respiratory sp ecimens during the acute  phase of infection. The expected result is Negative. Fact Sheet for Patients:  StrictlyIdeas.no Fact Sheet for Healthcare Providers: BankingDealers.co.za This test is not yet approved or cleared by the [place] FDA and has been authorized for detection and/or diagnosis of SARS-CoV-2  by FDA under an Emergency Use Authorization (EUA).  This EUA will remain in effect (meaning this test can be used) for the duration of the COVID-19 declaration under Section 564(b)(1) of the Act, 21 U.S.C. section 360bbb-3(b)(1), unless the authorization is terminated or revoked sooner. Performed at [facility], [address], [place], [place] [address]   CBC     Status: None   Collection Time: 06/20/19  6:34 AM  Result Value Ref Range   WBC 7.7 4.0 - 10.5 K/uL   RBC 4.45 4.22 - 5.81 MIL/uL   Hemoglobin 13.1 13.0 - 17.0 g/dL   HCT 42.2 39.0 - 52.0 %   MCV 94.8 80.0 - 100.0 fL   MCH 29.4 26.0 - 34.0 pg   MCHC 31.0 30.0 - 36.0 g/dL   RDW 13.1 11.5 - 15.5 %   Platelets 306 150 - 400 K/uL   nRBC 0.0 0.0 - 0.2 %    Comment: Performed at [facility], [address] [place],  [address]  Comprehensive metabolic panel     Status: Abnormal   Collection Time: 06/20/19  6:34 AM  Result Value Ref Range   Sodium 138 135 - 145 mmol/L   Potassium 3.9 3.5 - 5.1 mmol/L   Chloride 106 98 - 111 mmol/L   CO2 25 22 - 32 mmol/L   Glucose, Bld 108 (H) 70 - 99 mg/dL   BUN 24 (H) 8 - 23 mg/dL   Creatinine, Ser 0.80 0.61 - 1.24 mg/dL   Calcium 8.8 (L) 8.9 - 10.3 mg/dL   Total Protein 6.1 (L) 6.5 - 8.1 g/dL   Albumin 3.3 (L) 3.5 - 5.0 g/dL   AST 14 (L) 15 - 41 U/L   ALT 15 0 - 44 U/L   Alkaline Phosphatase 74 38 - 126 U/L   Total Bilirubin 0.7 0.3 - 1.2 mg/dL   GFR calc non Af Amer >60 >60 mL/min   GFR calc Af Amer >60 >60 mL/min   Anion gap 7 5 - 15  Comment: Performed at [facility], [address] [place], [place] [address]  Glucose, capillary     Status: Abnormal   Collection Time: 06/20/19  7:52 AM  Result Value Ref Range   Glucose-Capillary 159 (H) 70 - 99 mg/dL  Glucose, capillary     Status: Abnormal   Collection Time: 06/20/19 11:02 AM  Result Value Ref Range   Glucose-Capillary 106 (H) 70 - 99 mg/dL  Glucose, capillary     Status: None   Collection Time: 06/20/19  4:17  PM  Result Value Ref Range   Glucose-Capillary 96 70 - 99 mg/dL  Glucose, capillary     Status: Abnormal   Collection Time: 06/20/19  8:52 PM  Result Value Ref Range   Glucose-Capillary 100 (H) 70 - 99 mg/dL   Comment 1 Notify RN    Comment 2 Document in Chart   Basic metabolic panel     Status: Abnormal   Collection Time: 06/21/19  5:56 AM  Result Value Ref Range   Sodium 136 135 - 145 mmol/L   Potassium 3.6 3.5 - 5.1 mmol/L   Chloride 105 98 - 111 mmol/L   CO2 24 22 - 32 mmol/L   Glucose, Bld 94 70 - 99 mg/dL   BUN 19 8 - 23 mg/dL   Creatinine, Ser 0.65 0.61 - 1.24 mg/dL   Calcium 8.5 (L) 8.9 - 10.3 mg/dL   GFR calc non Af Amer >60 >60 mL/min   GFR calc Af Amer >60 >60 mL/min   Anion gap 7 5 - 15    Comment: Performed at [facility] [place], [address] [place], [place] [address]    ABGS No results for input(s): PHART, PO2ART, TCO2, HCO3 in the last 72 hours.  Invalid input(s): PCO2 CULTURES Recent Results (from the past 240 hour(s))  SARS Coronavirus 2 [facility] order, Performed in [facility] hospital lab) Nasopharyngeal Nasopharyngeal Swab     Status: None   Collection Time: 06/19/19  7:56 PM   Specimen: Nasopharyngeal Swab  Result Value Ref Range Status   SARS Coronavirus 2 NEGATIVE NEGATIVE Final    Comment: (NOTE) If result is NEGATIVE SARS-CoV-2 target nucleic acids are NOT DETECTED. The SARS-CoV-2 RNA is generally detectable in upper and lower  respiratory specimens during the acute phase of infection. The lowest  concentration of SARS-CoV-2 viral copies this assay can detect is 250  copies / mL. A negative result does not preclude SARS-CoV-2 infection  and should not be used as the sole basis for treatment or other  patient management decisions.  A negative result may occur with  improper specimen collection / handling, submission of specimen other  than nasopharyngeal swab, presence of viral mutation(s) within the  areas targeted by this assay, and inadequate  number of viral copies  (<250 copies / mL). A negative result must be combined with clinical  observations, patient history, and epidemiological information. If result is POSITIVE SARS-CoV-2 target nucleic acids are DETECTED. The SARS-CoV-2 RNA is generally detectable in upper and lower  respiratory specimens dur ing the acute phase of infection.  Positive  results are indicative of active infection with SARS-CoV-2.  Clinical  correlation with patient history and other diagnostic information is  necessary to determine patient infection status.  Positive results do  not rule out bacterial infection or co-infection with other viruses. If result is PRESUMPTIVE POSTIVE SARS-CoV-2 nucleic acids MAY BE PRESENT.   A presumptive positive result was obtained on the submitted specimen  and confirmed on  repeat testing.  While 2019 novel coronavirus  (SARS-CoV-2) nucleic acids may be present in the submitted sample  additional confirmatory testing may be necessary for epidemiological  and / or clinical management purposes  to differentiate between  SARS-CoV-2 and other Sarbecovirus currently known to infect humans.  If clinically indicated additional testing with an alternate test  methodology [phone]) is advised. The SARS-CoV-2 RNA is generally  detectable in upper and lower respiratory sp ecimens during the acute  phase of infection. The expected result is Negative. Fact Sheet for Patients:  StrictlyIdeas.no Fact Sheet for Healthcare Providers: BankingDealers.co.za This test is not yet approved or cleared by the [place] FDA and has been authorized for detection and/or diagnosis of SARS-CoV-2 by FDA under an Emergency Use Authorization (EUA).  This EUA will remain in effect (meaning this test can be used) for the duration of the COVID-19 declaration under Section 564(b)(1) of the Act, 21 U.S.C. section 360bbb-3(b)(1), unless the  authorization is terminated or revoked sooner. Performed at [facility], [address] [place], [place] [address]    Studies/Results: Ct Head Wo Contrast  Result Date: 06/19/2019 CLINICAL DATA:  Multiple falls, generalized weakness. EXAM: CT HEAD WITHOUT CONTRAST TECHNIQUE: Contiguous axial images were obtained from the base of the skull through the vertex without intravenous contrast. COMPARISON:  None. FINDINGS: Brain: Mild diffuse cortical atrophy is noted. No mass effect or midline shift is noted. Ventricular size is within normal limits. There is no evidence of mass lesion, hemorrhage or acute infarction. Vascular: No hyperdense vessel or unexpected calcification. Skull: Normal. Negative for fracture or focal lesion. Sinuses/Orbits: No acute finding. Other: None. IMPRESSION: Mild diffuse cortical atrophy. No acute intracranial abnormality seen. Electronically Signed   By: Marijo Conception M.D.   On: 06/19/2019 16:13   Ct Lumbar Spine Wo Contrast  Result Date: 06/19/2019 CLINICAL DATA:  85 year old male with difficulty sitting or standing. Multiple falls. Generalized weakness. EXAM: CT LUMBAR SPINE WITHOUT CONTRAST TECHNIQUE: Multidetector CT imaging of the lumbar spine was performed without intravenous contrast administration. Multiplanar CT image reconstructions were also generated. COMPARISON:  None. FINDINGS: Segmentation: 5 lumbar type vertebrae. Alignment: No acute subluxation. There is straightening of normal lumbar lordosis. There is levoscoliosis of the lumbar spine with a Cobb angle approximately 11 degrees measured from the superior endplate of 624THL to the inferior endplate of L3. No significant listhesis. Vertebrae: The bones are osteopenic. There is no acute fracture. There is L4-L5 and he will oasis as well as bridging osteophyte along the right L1-L3 vertebra. Large anterior osteophyte at L2-L3 and L3-L4 as well as at T12-L1. Paraspinal and other soft tissues: There is atherosclerotic  calcification of the aorta. There is calcification of the medial limb of the right adrenal gland, likely sequela of prior infection/inflammation or hemorrhage. Disc levels: Extensive multilevel disc disease with endplate irregularity and disc space narrowing. There is severe disc desiccation and vacuum phenomena at T12-L1. There is diffuse disc bulge asymmetric to the left. There is left posterior osteophyte complex. There is bilateral neural foramina narrowing at this level. Additional multilevel diffuse disc bulge and neural foramina narrowing most severe at L3-L4 and L4-L5. IMPRESSION: 1. No acute/traumatic lumbar spine pathology. 2. Osteopenia with extensive multilevel degenerative changes of the spine. 3. Lumbar levoscoliosis and multilevel disc bulge and neural foraminal narrowing. MRI may provide better evaluation. Aortic Atherosclerosis (ICD10-I70.0). Electronically Signed   By: Anner Crete M.D.   On: 06/19/2019 16:23   Dg Chest Portable 1 View  Result  Date: 06/19/2019 CLINICAL DATA:  Weakness. EXAM: PORTABLE CHEST 1 VIEW COMPARISON:  Radiographs of May 02, 2007. FINDINGS: The heart size and mediastinal contours are within normal limits. No pneumothorax or pleural effusion is noted. Left lung is clear. Minimal right basilar subsegmental atelectasis is noted. The visualized skeletal structures are unremarkable. IMPRESSION: Minimal right basilar subsegmental atelectasis. Electronically Signed   By: Marijo Conception M.D.   On: 06/19/2019 15:16    Medications:  Prior to Admission:  Medications Prior to Admission  Medication Sig Dispense Refill Last Dose  . acetaminophen (TYLENOL) 500 MG tablet Take 500-1,000 mg by mouth every 6 (six) hours as needed. For pain     . allopurinol (ZYLOPRIM) 300 MG tablet Take 300 mg by mouth daily.     06/19/2019 at Unknown time  . amLODipine (NORVASC) 10 MG tablet Take 10 mg by mouth daily.     06/19/2019 at Unknown time  . aspirin EC 81 MG tablet Take 81 mg by  mouth daily.   06/19/2019 at 900  . donepezil (ARICEPT) 10 MG tablet Take 10 mg by mouth daily.   06/19/2019 at Unknown time  . furosemide (LASIX) 40 MG tablet Take 40 mg by mouth daily.     06/19/2019 at Unknown time  . HYDROcodone-acetaminophen (NORCO/VICODIN) 5-325 MG per tablet Take one-two tabs po q 4-6 hrs prn pain 6 tablet 0 06/19/2019 at Unknown time  . lisinopril (PRINIVIL,ZESTRIL) 20 MG tablet Take 20 mg by mouth daily.     06/19/2019 at Unknown time  . meloxicam (MOBIC) 7.5 MG tablet Take 7.5 mg by mouth daily.     06/19/2019 at Unknown time  . Multiple Vitamin (MULITIVITAMIN WITH MINERALS) TABS Take 1 tablet by mouth daily.   06/19/2019 at Unknown time  . omeprazole (PRILOSEC) 20 MG capsule Take 20 mg by mouth daily.     06/19/2019 at Unknown time  . potassium chloride SA (K-DUR,KLOR-CON) 20 MEQ tablet Take 20 mEq by mouth daily.     06/19/2019 at Unknown time  . simvastatin (ZOCOR) 20 MG tablet Take 20 mg by mouth at bedtime.     06/19/2019 at Unknown time  . nitroGLYCERIN (NITROSTAT) 0.4 MG SL tablet Place 1 tablet (0.4 mg total) under the tongue every 5 (five) minutes as needed for chest pain. 25 tablet 3    Scheduled: . allopurinol  300 mg Oral Daily  . amLODipine  10 mg Oral Daily  . aspirin EC  81 mg Oral Daily  . donepezil  10 mg Oral Daily  . enoxaparin (LOVENOX) injection  40 mg Subcutaneous Q24H  . lisinopril  20 mg Oral Daily  . meloxicam  7.5 mg Oral Daily  . pantoprazole  40 mg Oral Daily  . simvastatin  20 mg Oral QHS   Continuous: . sodium chloride 75 mL/hr at 06/21/19 0735   [id number] **OR** ondansetron (ZOFRAN) IV  Assesment: He was admitted because of inability to ambulate and multiple falls.  He is still very weak.  He is unable to be cared for at home safely.  He has multiple abnormalities in his lumbar spine CT but could not have MRI.  He has osteoarthritis of multiple joints at baseline  He has hypertension which is pretty well controlled  He has reflux  which is stable by history  He has deconditioning and is going to have PT and OT and go to skilled care facility  He is dehydrated and receiving IV fluids Active Problems:   Falls  Multiple falls    Plan: Continue treatments.  I think he needs another day of IV fluids.  Continue everything else.  Plan for transfer to skilled care facility when medically stable    LOS: 1 day   Eric Bowman 06/21/2019, 7:48 AM

## 2019-06-21 NOTE — Progress Notes (Signed)
Physical Therapy Treatment Patient Details Name: Eric Bowman MRN: [id number] DOB: 02/21/1933 Today's Date: 06/21/2019    History of Present Illness Eric Bowman  is a 85 y.o. male, with history of dementia, CAD, hyperlipidemia, hypertension, sleep apnea on CPAP, was brought to the hospital as patient is having difficulty walking at home.  Over the past 2 weeks patient has as many as 17 falls.  Patient is a poor historian, history obtained from the ED notes.  As per wife patient has not been out of bed for a week.  In the ED CT of the head was unremarkable, CT lumbar spine showed multilevel disc degenerative disease.    PT Comments    Patient demonstrates labored movement for sitting up at bedside, very unsteady with frequent leaning backwards without complete loss of balance.  Patient able to stand with RW, but unable to take side steps due to weakness and had to be lowered to floor due to knees buckling and legs giving way.  Patient required 3 person assist to transfer from floor to chair and tolerated sitting up in chair with visitor present in room after therapy - nursing staff aware.  Patient will benefit from continued physical therapy in hospital and recommended venue below to increase strength, balance, endurance for safe ADLs and gait.    Follow Up Recommendations  SNF     Equipment Recommendations       Recommendations for Other Services       Precautions / Restrictions Precautions Precautions: Fall Precaution Comments: 17 falls in past month Restrictions Weight Bearing Restrictions: No    Mobility  Bed Mobility Overal bed mobility: Needs Assistance Bed Mobility: Supine to Sit     Supine to sit: Min assist;Mod assist     General bed mobility comments: slow labored movement, frequent verbal cueing due to [facility]  Transfers Overall transfer level: Needs assistance Equipment used: Rolling walker (2 wheeled) Transfers: Sit to/from Stand Sit to Stand: Mod assist          General transfer comment: very unsteady on feet with loss of balance and lowered to floor  Ambulation/Gait                 Stairs             Wheelchair Mobility    Modified Rankin (Stroke Patients Only)       Balance Overall balance assessment: Needs assistance Sitting-balance support: Feet supported Sitting balance-Leahy Scale: Poor Sitting balance - Comments: fair/poor seated at bedside with near loss of balance when leaning backwards   Standing balance support: During functional activity;Bilateral upper extremity supported Standing balance-Leahy Scale: Poor Standing balance comment: using RW                            Cognition Arousal/Alertness: Awake/alert Behavior During Therapy: WFL for tasks assessed/performed Overall Cognitive Status: Within Functional Limits for tasks assessed                                        Exercises General Exercises - Lower Extremity Long Arc Quad: Seated;AROM;Strengthening;Both;10 reps Hip Flexion/Marching: Seated;AROM;Strengthening;Both;10 reps Toe Raises: Seated;AROM;Strengthening;Left;10 reps Heel Raises: Seated;AROM;Both;10 reps;Strengthening    General Comments        Pertinent Vitals/Pain Pain Assessment: Faces Faces Pain Scale: Hurts a little bit Pain Location: left shoulder with end range flexion Pain  Descriptors / Indicators: Sore Pain Intervention(s): Limited activity within patient's tolerance;Monitored during session    Home Living Family/patient expects to be discharged to:: Private residence Living Arrangements: Spouse/significant other                  Prior Function            PT Goals (current goals can now be found in the care plan section) Acute Rehab PT Goals Patient Stated Goal: to go home PT Goal Formulation: With patient Time For Goal Achievement: 07/04/19 Potential to Achieve Goals: Fair Progress towards PT goals: Progressing toward  goals    Frequency    Min 3X/week      PT Plan Current plan remains appropriate    Co-evaluation              AM-PAC PT "6 Clicks" Mobility   Outcome Measure  Help needed turning from your back to your side while in a flat bed without using bedrails?: A Little Help needed moving from lying on your back to sitting on the side of a flat bed without using bedrails?: A Lot Help needed moving to and from a bed to a chair (including a wheelchair)?: Total Help needed standing up from a chair using your arms (e.g., wheelchair or bedside chair)?: A Lot Help needed to walk in hospital room?: Total Help needed climbing 3-5 steps with a railing? : Total 6 Click Score: 10    End of Session Equipment Utilized During Treatment: Gait belt Activity Tolerance: Patient limited by fatigue Patient left: in chair;with call bell/phone within reach;with chair alarm set;with family/visitor present Nurse Communication: Mobility status;Precautions PT Visit Diagnosis: Other abnormalities of gait and mobility (R26.89);Unsteadiness on feet (R26.81);Repeated falls (R29.6);Muscle weakness (generalized) (M62.81)     Time: [id number] PT Time Calculation (min) (ACUTE ONLY): 27 min  Charges:  $Therapeutic Exercise: 8-22 mins $Therapeutic Activity: 8-22 mins                     4:00 PM, 06/21/19 Lonell Grandchild, MPT Physical Therapist with [facility] [phone] office [phone] mobile phone

## 2019-06-21 NOTE — Progress Notes (Signed)
PT, J. Chrissie Noa requested D. Britta Mccreedy RN for assistance with patient due to patient, Eric Bowman sitting on floor. Eula Fried PT placed patient on floor due to patients inability to bear weight and knees buckling per PT. Patient sitting with back to bed, leg stretched out in front on him and gait belt around waist. NT, Robin, RN Britta Mccreedy and PT Chrissie Noa assisted the patient to a standing position and pivoted to the chair and placed Eric [facility]man in chair. Patient was boosted up in the chair with all three assistance. During transfer. Chrissie Noa was at patients right, NT was on his left, and RN Britta Mccreedy was at feet. Feet were stabilized for sliding , lift to stand with patient placing his feet under himself and thereby, pivotting to chair.Ptient denied any pain or needs at that time.  Elodia Florence RN 06/21/2019 @ 1730

## 2019-06-21 NOTE — Progress Notes (Signed)
Patient complained of pain on backside of right upper arm and lower back. No swelling, redness, or heat noted from either area. Provided patient with heat packs to the right arm and back. Dr. Luan Pulling notified and order was received. If patient continues to complain of pain after medication intervention. Call back for orders for an xray. Patient was transferred to bed with [facility] lift by 2 NT's and and 1 spotter. Patient tolerated transfer well. RN Britta Mccreedy reexamined areas of orginal complaint with no changes noted on assessment or vernally by patient. Elodia Florence RN 06/21/2019 @1800 

## 2019-06-21 NOTE — Progress Notes (Addendum)
Patient refuses the use of CPAP 

## 2019-06-22 ENCOUNTER — Inpatient Hospital Stay
Admission: RE | Admit: 2019-06-22 | Discharge: 2020-07-27 | Disposition: A | Discharge status: Other Acute Inpatient Hospital | Payer: Medicare Other | Source: Ambulatory Visit | Attending: Internal Medicine | Admitting: Internal Medicine

## 2019-06-22 ENCOUNTER — Inpatient Hospital Stay (HOSPITAL_COMMUNITY): Payer: Medicare Other

## 2019-06-22 DIAGNOSIS — J310 Chronic rhinitis: Secondary | ICD-10-CM | POA: Diagnosis not present

## 2019-06-22 DIAGNOSIS — M5136 Other intervertebral disc degeneration, lumbar region: Secondary | ICD-10-CM | POA: Diagnosis not present

## 2019-06-22 DIAGNOSIS — I2510 Atherosclerotic heart disease of native coronary artery without angina pectoris: Secondary | ICD-10-CM | POA: Diagnosis not present

## 2019-06-22 DIAGNOSIS — M79605 Pain in left leg: Secondary | ICD-10-CM | POA: Diagnosis not present

## 2019-06-22 DIAGNOSIS — J90 Pleural effusion, not elsewhere classified: Secondary | ICD-10-CM

## 2019-06-22 DIAGNOSIS — R5381 Other malaise: Secondary | ICD-10-CM | POA: Diagnosis not present

## 2019-06-22 DIAGNOSIS — E860 Dehydration: Secondary | ICD-10-CM | POA: Diagnosis not present

## 2019-06-22 DIAGNOSIS — F0150 Vascular dementia without behavioral disturbance: Secondary | ICD-10-CM

## 2019-06-22 DIAGNOSIS — G4733 Obstructive sleep apnea (adult) (pediatric): Secondary | ICD-10-CM | POA: Diagnosis not present

## 2019-06-22 DIAGNOSIS — J449 Chronic obstructive pulmonary disease, unspecified: Secondary | ICD-10-CM | POA: Diagnosis not present

## 2019-06-22 DIAGNOSIS — M1A09X0 Idiopathic chronic gout, multiple sites, without tophus (tophi): Secondary | ICD-10-CM | POA: Diagnosis not present

## 2019-06-22 DIAGNOSIS — R269 Unspecified abnormalities of gait and mobility: Secondary | ICD-10-CM | POA: Diagnosis not present

## 2019-06-22 DIAGNOSIS — I10 Essential (primary) hypertension: Principal | ICD-10-CM

## 2019-06-22 DIAGNOSIS — M19042 Primary osteoarthritis, left hand: Secondary | ICD-10-CM | POA: Diagnosis not present

## 2019-06-22 DIAGNOSIS — M4722 Other spondylosis with radiculopathy, cervical region: Secondary | ICD-10-CM

## 2019-06-22 DIAGNOSIS — G301 Alzheimer's disease with late onset: Secondary | ICD-10-CM | POA: Diagnosis not present

## 2019-06-22 DIAGNOSIS — R296 Repeated falls: Secondary | ICD-10-CM | POA: Diagnosis not present

## 2019-06-22 DIAGNOSIS — R262 Difficulty in walking, not elsewhere classified: Secondary | ICD-10-CM | POA: Diagnosis not present

## 2019-06-22 DIAGNOSIS — Z741 Need for assistance with personal care: Secondary | ICD-10-CM | POA: Diagnosis not present

## 2019-06-22 DIAGNOSIS — M19041 Primary osteoarthritis, right hand: Secondary | ICD-10-CM | POA: Diagnosis not present

## 2019-06-22 DIAGNOSIS — M109 Gout, unspecified: Secondary | ICD-10-CM | POA: Diagnosis not present

## 2019-06-22 DIAGNOSIS — E119 Type 2 diabetes mellitus without complications: Secondary | ICD-10-CM | POA: Diagnosis not present

## 2019-06-22 DIAGNOSIS — H9193 Unspecified hearing loss, bilateral: Secondary | ICD-10-CM | POA: Diagnosis not present

## 2019-06-22 DIAGNOSIS — D860 Sarcoidosis of lung: Secondary | ICD-10-CM | POA: Diagnosis not present

## 2019-06-22 DIAGNOSIS — M79671 Pain in right foot: Secondary | ICD-10-CM | POA: Diagnosis not present

## 2019-06-22 DIAGNOSIS — K219 Gastro-esophageal reflux disease without esophagitis: Secondary | ICD-10-CM | POA: Diagnosis not present

## 2019-06-22 DIAGNOSIS — J189 Pneumonia, unspecified organism: Secondary | ICD-10-CM

## 2019-06-22 DIAGNOSIS — U071 COVID-19: Secondary | ICD-10-CM

## 2019-06-22 DIAGNOSIS — K589 Irritable bowel syndrome without diarrhea: Secondary | ICD-10-CM | POA: Diagnosis not present

## 2019-06-22 DIAGNOSIS — M1712 Unilateral primary osteoarthritis, left knee: Secondary | ICD-10-CM | POA: Diagnosis not present

## 2019-06-22 DIAGNOSIS — M6281 Muscle weakness (generalized): Secondary | ICD-10-CM | POA: Diagnosis not present

## 2019-06-22 DIAGNOSIS — E785 Hyperlipidemia, unspecified: Secondary | ICD-10-CM | POA: Diagnosis not present

## 2019-06-22 DIAGNOSIS — Z9181 History of falling: Secondary | ICD-10-CM | POA: Diagnosis not present

## 2019-06-22 DIAGNOSIS — S3992XA Unspecified injury of lower back, initial encounter: Secondary | ICD-10-CM | POA: Diagnosis not present

## 2019-06-22 DIAGNOSIS — Z9889 Other specified postprocedural states: Secondary | ICD-10-CM

## 2019-06-22 DIAGNOSIS — R0602 Shortness of breath: Secondary | ICD-10-CM

## 2019-06-22 MED ORDER — LISINOPRIL 5 MG PO TABS: 5.0000 mg | ORAL_TABLET | Freq: Every day | ORAL | Status: DC

## 2019-06-22 MED ORDER — ATORVASTATIN CALCIUM 20 MG PO TABS: 20.0000 mg | ORAL_TABLET | Freq: Every day | ORAL | 11 refills | Status: DC

## 2019-06-22 MED ORDER — HYDROCODONE-ACETAMINOPHEN 5-325 MG PO TABS: ORAL_TABLET | ORAL | 0 refills | Status: DC

## 2019-06-22 MED ORDER — ONDANSETRON HCL 4 MG PO TABS: 4.0000 mg | ORAL_TABLET | Freq: Four times a day (QID) | ORAL | 0 refills | Status: DC | PRN

## 2019-06-22 MED ORDER — PANTOPRAZOLE SODIUM 40 MG PO TBEC: 40.0000 mg | DELAYED_RELEASE_TABLET | Freq: Every day | ORAL | Status: AC

## 2019-06-22 MED ORDER — AMLODIPINE BESYLATE 5 MG PO TABS: 5.0000 mg | ORAL_TABLET | Freq: Every day | ORAL | Status: DC

## 2019-06-22 NOTE — Progress Notes (Signed)
Subjective: He is overall about the same.  He had to be lowered to the floor yesterday because he became suddenly weak.  He is complaining of soreness in his right triceps area and soreness in his back.  Objective: Vital signs in last 24 hours: Temp:  [98 F (36.7 C)-98.3 F (36.8 C)] 98.2 F (36.8 C) (08/22 0605) Pulse Rate:  [66-76] 75 (08/22 0605) Resp:  [17-20] 17 (08/22 0605) BP: (105-128)/(50-68) 128/68 (08/22 0605) SpO2:  [96 %-100 %] 96 % (08/22 0605) Weight change:  Last BM Date: 06/19/19  Intake/Output from previous day: 08/21 0701 - 08/22 0700 In: 960 [P.O.:960] Out: 2250 [Urine:2250]  PHYSICAL EXAM General appearance: alert, cooperative and no distress Resp: clear to auscultation bilaterally Cardio: regular rate and rhythm, S1, S2 normal, no murmur, click, rub or gallop GI: soft, non-tender; bowel sounds normal; no masses,  no organomegaly Extremities: extremities normal, atraumatic, no cyanosis or edema  Lab Results:  Results for orders placed or performed during the hospital encounter of 06/19/19 (from the past 48 hour(s))  Glucose, capillary     Status: Abnormal   Collection Time: 06/20/19 11:02 AM  Result Value Ref Range   Glucose-Capillary 106 (H) 70 - 99 mg/dL  Glucose, capillary     Status: None   Collection Time: 06/20/19  4:17 PM  Result Value Ref Range   Glucose-Capillary 96 70 - 99 mg/dL  Glucose, capillary     Status: Abnormal   Collection Time: 06/20/19  8:52 PM  Result Value Ref Range   Glucose-Capillary 100 (H) 70 - 99 mg/dL   Comment 1 Notify RN    Comment 2 Document in Chart   Basic metabolic panel     Status: Abnormal   Collection Time: 06/21/19  5:56 AM  Result Value Ref Range   Sodium 136 135 - 145 mmol/L   Potassium 3.6 3.5 - 5.1 mmol/L   Chloride 105 98 - 111 mmol/L   CO2 24 22 - 32 mmol/L   Glucose, Bld 94 70 - 99 mg/dL   BUN 19 8 - 23 mg/dL   Creatinine, Ser 0.65 0.61 - 1.24 mg/dL   Calcium 8.5 (L) 8.9 - 10.3 mg/dL   GFR calc  non Af Amer >60 >60 mL/min   GFR calc Af Amer >60 >60 mL/min   Anion gap 7 5 - 15    Comment: Performed at [facility], [address], [place], [place] [address]  Glucose, capillary     Status: Abnormal   Collection Time: 06/21/19  4:20 PM  Result Value Ref Range   Glucose-Capillary 112 (H) 70 - 99 mg/dL   Comment 1 Notify RN    Comment 2 Document in Chart   Glucose, capillary     Status: Abnormal   Collection Time: 06/21/19  9:38 PM  Result Value Ref Range   Glucose-Capillary 103 (H) 70 - 99 mg/dL   Comment 1 Notify RN    Comment 2 Document in Chart     ABGS No results for input(s): PHART, PO2ART, TCO2, HCO3 in the last 72 hours.  Invalid input(s): PCO2 CULTURES Recent Results (from the past 240 hour(s))  Urine culture     Status: None   Collection Time: 06/19/19  3:19 PM   Specimen: Urine, Clean Catch  Result Value Ref Range Status   Specimen Description   Final    URINE, CLEAN CATCH Performed at [facility], [address], [place], [place] [address]    Special Requests  Final    Normal Performed at [facility], [address], [place], [place] [address]    Culture   Final    NO GROWTH Performed at [facility], [place] [address] [place], [place] [address]    Report Status 06/21/2019 FINAL  Final  SARS Coronavirus 2 [facility] order, Performed in [facility] hospital lab) Nasopharyngeal Nasopharyngeal Swab     Status: None   Collection Time: 06/19/19  7:56 PM   Specimen: Nasopharyngeal Swab  Result Value Ref Range Status   SARS Coronavirus 2 NEGATIVE NEGATIVE Final    Comment: (NOTE) If result is NEGATIVE SARS-CoV-2 target nucleic acids are NOT DETECTED. The SARS-CoV-2 RNA is generally detectable in upper and lower  respiratory specimens during the acute phase of infection. The lowest  concentration of SARS-CoV-2 viral copies this assay can detect is 250  copies / mL. A negative result does not preclude SARS-CoV-2 infection  and should not be used as the  sole basis for treatment or other  patient management decisions.  A negative result may occur with  improper specimen collection / handling, submission of specimen other  than nasopharyngeal swab, presence of viral mutation(s) within the  areas targeted by this assay, and inadequate number of viral copies  (<250 copies / mL). A negative result must be combined with clinical  observations, patient history, and epidemiological information. If result is POSITIVE SARS-CoV-2 target nucleic acids are DETECTED. The SARS-CoV-2 RNA is generally detectable in upper and lower  respiratory specimens dur ing the acute phase of infection.  Positive  results are indicative of active infection with SARS-CoV-2.  Clinical  correlation with patient history and other diagnostic information is  necessary to determine patient infection status.  Positive results do  not rule out bacterial infection or co-infection with other viruses. If result is PRESUMPTIVE POSTIVE SARS-CoV-2 nucleic acids MAY BE PRESENT.   A presumptive positive result was obtained on the submitted specimen  and confirmed on repeat testing.  While 2019 novel coronavirus  (SARS-CoV-2) nucleic acids may be present in the submitted sample  additional confirmatory testing may be necessary for epidemiological  and / or clinical management purposes  to differentiate between  SARS-CoV-2 and other Sarbecovirus currently known to infect humans.  If clinically indicated additional testing with an alternate test  methodology [phone]) is advised. The SARS-CoV-2 RNA is generally  detectable in upper and lower respiratory sp ecimens during the acute  phase of infection. The expected result is Negative. Fact Sheet for Patients:  StrictlyIdeas.no Fact Sheet for Healthcare Providers: BankingDealers.co.za This test is not yet approved or cleared by the [place] FDA and has been authorized for detection  and/or diagnosis of SARS-CoV-2 by FDA under an Emergency Use Authorization (EUA).  This EUA will remain in effect (meaning this test can be used) for the duration of the COVID-19 declaration under Section 564(b)(1) of the Act, 21 U.S.C. section 360bbb-3(b)(1), unless the authorization is terminated or revoked sooner. Performed at [facility], [address] [place], [place] [address]    Studies/Results: No results found.  Medications:  Prior to Admission:  Medications Prior to Admission  Medication Sig Dispense Refill Last Dose  . acetaminophen (TYLENOL) 500 MG tablet Take 500-1,000 mg by mouth every 6 (six) hours as needed. For pain     . allopurinol (ZYLOPRIM) 300 MG tablet Take 300 mg by mouth daily.     06/19/2019 at Unknown time  . aspirin EC 81 MG tablet Take 81 mg  by mouth daily.   06/19/2019 at 900  . donepezil (ARICEPT) 10 MG tablet Take 10 mg by mouth daily.   06/19/2019 at Unknown time  . furosemide (LASIX) 40 MG tablet Take 40 mg by mouth daily.     06/19/2019 at Unknown time  . meloxicam (MOBIC) 7.5 MG tablet Take 7.5 mg by mouth daily.     06/19/2019 at Unknown time  . Multiple Vitamin (MULITIVITAMIN WITH MINERALS) TABS Take 1 tablet by mouth daily.   06/19/2019 at Unknown time  . omeprazole (PRILOSEC) 20 MG capsule Take 20 mg by mouth daily.     06/19/2019 at Unknown time  . potassium chloride SA (K-DUR,KLOR-CON) 20 MEQ tablet Take 20 mEq by mouth daily.     06/19/2019 at Unknown time  . simvastatin (ZOCOR) 20 MG tablet Take 20 mg by mouth at bedtime.     06/19/2019 at Unknown time  . [DISCONTINUED] amLODipine (NORVASC) 10 MG tablet Take 10 mg by mouth daily.     06/19/2019 at Unknown time  . [DISCONTINUED] HYDROcodone-acetaminophen (NORCO/VICODIN) 5-325 MG per tablet Take one-two tabs po q 4-6 hrs prn pain 6 tablet 0 06/19/2019 at Unknown time  . [DISCONTINUED] lisinopril (PRINIVIL,ZESTRIL) 20 MG tablet Take 20 mg by mouth daily.     06/19/2019 at Unknown time  . nitroGLYCERIN  (NITROSTAT) 0.4 MG SL tablet Place 1 tablet (0.4 mg total) under the tongue every 5 (five) minutes as needed for chest pain. 25 tablet 3    Scheduled: . allopurinol  300 mg Oral Daily  . amLODipine  10 mg Oral Daily  . aspirin EC  81 mg Oral Daily  . donepezil  10 mg Oral Daily  . enoxaparin (LOVENOX) injection  40 mg Subcutaneous Q24H  . lisinopril  20 mg Oral Daily  . meloxicam  7.5 mg Oral Daily  . pantoprazole  40 mg Oral Daily  . simvastatin  20 mg Oral QHS   Continuous: . sodium chloride 75 mL/hr at 06/21/19 2124   [id number], ondansetron **OR** ondansetron (ZOFRAN) IV  Assesment: He was admitted with multiple falls, dehydration failure to thrive at home.  He has been rehydrated and that is doing better.  He had a near fall here yesterday and is complaining of more back pain so I am going to get an x-[place]  He has severe arthritis in his back and his legs and he wears braces on his legs at baseline  He has sleep apnea on CPAP  He is scheduled for transfer to skilled care facility which will happen is assuming that his x-[place] is okay  He has dementia which is fairly mild  He has COPD which is stable  He has degenerative disc disease in his back also  He has hypertension and his blood pressures been relatively low I think partially because of his dehydration but I am going to modify his blood pressure medications and reduce them.  Will stop his Lasix at the nursing home as well Active Problems:   SLEEP APNEA, OBSTRUCTIVE   Essential hypertension   GERD   Arthritis of knee, left   Degenerative disc disease, lumbar   Falls   Multiple falls    Plan: Transfer to skilled care facility.  Lumbar spine x-[place]    LOS: 2 days   Alonza Bogus 06/22/2019, 8:38 AM

## 2019-06-22 NOTE — TOC Transition Note (Signed)
Transition of Care [facility]) - CM/SW Discharge Note   Patient Details  Name: Eric Bowman MRN: [id number] Date of Birth: 1933/01/15  Transition of Care [facility]) CM/SW Contact:  Latanya Maudlin, RN Phone Number: 06/22/2019, 2:03 PM   Clinical Narrative:  Patient cleared for discharge. Will go to [facility] center room 157. Bedside RN has number to call report.        Barriers to Discharge: No Barriers Identified   Patient Goals and CMS Choice Patient states their goals for this hospitalization and ongoing recovery are:: "I'm willing to go to rehab if it is what is recommended." CMS Medicare.gov Compare Post Acute Care list provided to:: Patient Choice offered to / list presented to : Patient  Discharge Placement                       Discharge Plan and Services   Discharge Planning Services: CM Consult Post Acute Care Choice: [place]                               Social Determinants of Health (SDOH) Interventions     Readmission Risk Interventions No flowsheet data found.

## 2019-06-22 NOTE — Progress Notes (Signed)
Patient to discharge to [facility] today. Called report to Eric Bowman,  receiving nurse at facility. Discharge packet to be sent with patient. Wife at bedside and aware of discharge plan and has delivered his home CPAP and clothes to [facility] earlier today. Pt in stable condition awaiting [facility] staff to pick him up for discharge. Donavan Foil, RN

## 2019-06-22 NOTE — Discharge Summary (Signed)
Physician Discharge Summary  Patient ID: KELIJAH MEEK MRN: [id number] DOB/AGE: 12/30/1932 85 y.o. Primary Care Physician:Lauralyn Shadowens, Percell Miller, MD Admit date: 06/19/2019 Discharge date: 06/22/2019    Discharge Diagnoses:   Active Problems:   SLEEP APNEA, OBSTRUCTIVE   Essential hypertension   GERD   Arthritis of knee, left   Degenerative disc disease, lumbar   Falls   Multiple falls Dementia COPD  Allergies as of 06/22/2019      Reactions   Penicillins Rash   Sulfonamide Derivatives Rash   Tetanus Toxoid Rash      Medication List    STOP taking these medications   furosemide 40 MG tablet Commonly known as: LASIX   omeprazole 20 MG capsule Commonly known as: PRILOSEC Replaced by: pantoprazole 40 MG tablet   potassium chloride SA 20 MEQ tablet Commonly known as: K-DUR   simvastatin 20 MG tablet Commonly known as: ZOCOR     TAKE these medications   acetaminophen 500 MG tablet Commonly known as: TYLENOL Take 500-1,000 mg by mouth every 6 (six) hours as needed. For pain   allopurinol 300 MG tablet Commonly known as: ZYLOPRIM Take 300 mg by mouth daily.   amLODipine 5 MG tablet Commonly known as: NORVASC Take 1 tablet (5 mg total) by mouth daily. What changed:   medication strength  how much to take   aspirin EC 81 MG tablet Take 81 mg by mouth daily.   atorvastatin 20 MG tablet Commonly known as: Lipitor Take 1 tablet (20 mg total) by mouth daily.   donepezil 10 MG tablet Commonly known as: ARICEPT Take 10 mg by mouth daily.   HYDROcodone-acetaminophen 5-325 MG tablet Commonly known as: NORCO/VICODIN 1 po q6h prn moderate pain What changed: additional instructions   lisinopril 5 MG tablet Commonly known as: ZESTRIL Take 1 tablet (5 mg total) by mouth daily. What changed:   medication strength  how much to take   meloxicam 7.5 MG tablet Commonly known as: MOBIC Take 7.5 mg by mouth daily.   multivitamin with minerals Tabs tablet Take 1  tablet by mouth daily.   nitroGLYCERIN 0.4 MG SL tablet Commonly known as: Nitrostat Place 1 tablet (0.4 mg total) under the tongue every 5 (five) minutes as needed for chest pain.   ondansetron 4 MG tablet Commonly known as: ZOFRAN Take 1 tablet (4 mg total) by mouth every 6 (six) hours as needed for nausea.   pantoprazole 40 MG tablet Commonly known as: PROTONIX Take 1 tablet (40 mg total) by mouth daily. Replaces: omeprazole 20 MG capsule       Discharged Condition: Improved    Consults: None  Significant Diagnostic Studies: Ct Head Wo Contrast  Result Date: 06/19/2019 CLINICAL DATA:  Multiple falls, generalized weakness. EXAM: CT HEAD WITHOUT CONTRAST TECHNIQUE: Contiguous axial images were obtained from the base of the skull through the vertex without intravenous contrast. COMPARISON:  None. FINDINGS: Brain: Mild diffuse cortical atrophy is noted. No mass effect or midline shift is noted. Ventricular size is within normal limits. There is no evidence of mass lesion, hemorrhage or acute infarction. Vascular: No hyperdense vessel or unexpected calcification. Skull: Normal. Negative for fracture or focal lesion. Sinuses/Orbits: No acute finding. Other: None. IMPRESSION: Mild diffuse cortical atrophy. No acute intracranial abnormality seen. Electronically Signed   By: Marijo Conception M.D.   On: 06/19/2019 16:13   Ct Lumbar Spine Wo Contrast  Result Date: 06/19/2019 CLINICAL DATA:  85 year old male with difficulty sitting or standing. Multiple falls. Generalized  weakness. EXAM: CT LUMBAR SPINE WITHOUT CONTRAST TECHNIQUE: Multidetector CT imaging of the lumbar spine was performed without intravenous contrast administration. Multiplanar CT image reconstructions were also generated. COMPARISON:  None. FINDINGS: Segmentation: 5 lumbar type vertebrae. Alignment: No acute subluxation. There is straightening of normal lumbar lordosis. There is levoscoliosis of the lumbar spine with a Cobb  angle approximately 11 degrees measured from the superior endplate of 624THL to the inferior endplate of L3. No significant listhesis. Vertebrae: The bones are osteopenic. There is no acute fracture. There is L4-L5 and he will oasis as well as bridging osteophyte along the right L1-L3 vertebra. Large anterior osteophyte at L2-L3 and L3-L4 as well as at T12-L1. Paraspinal and other soft tissues: There is atherosclerotic calcification of the aorta. There is calcification of the medial limb of the right adrenal gland, likely sequela of prior infection/inflammation or hemorrhage. Disc levels: Extensive multilevel disc disease with endplate irregularity and disc space narrowing. There is severe disc desiccation and vacuum phenomena at T12-L1. There is diffuse disc bulge asymmetric to the left. There is left posterior osteophyte complex. There is bilateral neural foramina narrowing at this level. Additional multilevel diffuse disc bulge and neural foramina narrowing most severe at L3-L4 and L4-L5. IMPRESSION: 1. No acute/traumatic lumbar spine pathology. 2. Osteopenia with extensive multilevel degenerative changes of the spine. 3. Lumbar levoscoliosis and multilevel disc bulge and neural foraminal narrowing. MRI may provide better evaluation. Aortic Atherosclerosis (ICD10-I70.0). Electronically Signed   By: Anner Crete M.D.   On: 06/19/2019 16:23   Dg Chest Portable 1 View  Result Date: 06/19/2019 CLINICAL DATA:  Weakness. EXAM: PORTABLE CHEST 1 VIEW COMPARISON:  Radiographs of May 02, 2007. FINDINGS: The heart size and mediastinal contours are within normal limits. No pneumothorax or pleural effusion is noted. Left lung is clear. Minimal right basilar subsegmental atelectasis is noted. The visualized skeletal structures are unremarkable. IMPRESSION: Minimal right basilar subsegmental atelectasis. Electronically Signed   By: Marijo Conception M.D.   On: 06/19/2019 15:16    Lab Results: Basic Metabolic  Panel: Recent Labs    06/20/19 0634 06/21/19 0556  NA 138 136  K 3.9 3.6  CL 106 105  CO2 25 24  GLUCOSE 108* 94  BUN 24* 19  CREATININE 0.80 0.65  CALCIUM 8.8* 8.5*   Liver Function Tests: Recent Labs    06/19/19 1504 06/20/19 0634  AST 17 14*  ALT 17 15  ALKPHOS 79 74  BILITOT 0.4 0.7  PROT 6.6 6.1*  ALBUMIN 3.4* 3.3*     CBC: Recent Labs    06/19/19 1504 06/20/19 0634  WBC 8.4 7.7  NEUTROABS 5.5  --   HGB 14.2 13.1  HCT 44.4 42.2  MCV 93.7 94.8  PLT 317 306    Recent Results (from the past 240 hour(s))  Urine culture     Status: None   Collection Time: 06/19/19  3:19 PM   Specimen: Urine, Clean Catch  Result Value Ref Range Status   Specimen Description   Final    URINE, CLEAN CATCH Performed at [facility], [address] [place], [place] [address]    Special Requests   Final    Normal Performed at [facility], [address], [place], [place] [address]    Culture   Final    NO GROWTH Performed at [facility], [place] [address], [place], [place] [address]    Report Status 06/21/2019 FINAL  Final  SARS Coronavirus 2 [facility] order, Performed in  [facility] hospital lab) Nasopharyngeal Nasopharyngeal Swab     Status: None   Collection Time: 06/19/19  7:56 PM   Specimen: Nasopharyngeal Swab  Result Value Ref Range Status   SARS Coronavirus 2 NEGATIVE NEGATIVE Final    Comment: (NOTE) If result is NEGATIVE SARS-CoV-2 target nucleic acids are NOT DETECTED. The SARS-CoV-2 RNA is generally detectable in upper and lower  respiratory specimens during the acute phase of infection. The lowest  concentration of SARS-CoV-2 viral copies this assay can detect is 250  copies / mL. A negative result does not preclude SARS-CoV-2 infection  and should not be used as the sole basis for treatment or other  patient management decisions.  A negative result may occur with  improper specimen collection / handling, submission of specimen other  than  nasopharyngeal swab, presence of viral mutation(s) within the  areas targeted by this assay, and inadequate number of viral copies  (<250 copies / mL). A negative result must be combined with clinical  observations, patient history, and epidemiological information. If result is POSITIVE SARS-CoV-2 target nucleic acids are DETECTED. The SARS-CoV-2 RNA is generally detectable in upper and lower  respiratory specimens dur ing the acute phase of infection.  Positive  results are indicative of active infection with SARS-CoV-2.  Clinical  correlation with patient history and other diagnostic information is  necessary to determine patient infection status.  Positive results do  not rule out bacterial infection or co-infection with other viruses. If result is PRESUMPTIVE POSTIVE SARS-CoV-2 nucleic acids MAY BE PRESENT.   A presumptive positive result was obtained on the submitted specimen  and confirmed on repeat testing.  While 2019 novel coronavirus  (SARS-CoV-2) nucleic acids may be present in the submitted sample  additional confirmatory testing may be necessary for epidemiological  and / or clinical management purposes  to differentiate between  SARS-CoV-2 and other Sarbecovirus currently known to infect humans.  If clinically indicated additional testing with an alternate test  methodology [phone]) is advised. The SARS-CoV-2 RNA is generally  detectable in upper and lower respiratory sp ecimens during the acute  phase of infection. The expected result is Negative. Fact Sheet for Patients:  StrictlyIdeas.no Fact Sheet for Healthcare Providers: BankingDealers.co.za This test is not yet approved or cleared by the [place] FDA and has been authorized for detection and/or diagnosis of SARS-CoV-2 by FDA under an Emergency Use Authorization (EUA).  This EUA will remain in effect (meaning this test can be used) for the duration of  the COVID-19 declaration under Section 564(b)(1) of the Act, 21 U.S.C. section 360bbb-3(b)(1), unless the authorization is terminated or revoked sooner. Performed at [facility], [address] [place], [place] [address]      Hospital Course: This is an 85 year old who has had multiple falls at home.  I had seen his wife in my office and he was actually scheduled for an appointment the same day but she said he could not get up to come for the appointment.  I arranged home health services and when they went to see him they found that he had fallen 17 times in the last month and that they were not able to safely help him with physical therapy.  He was brought to the hospital because of that.  He was dehydrated.  He had fluid replacement.  He had PT OT consults.  He was felt to be appropriate for skilled care facility placement.  Discharge Exam: Blood pressure 128/68, pulse 75, temperature 98.2 F (  36.8 C), temperature source Oral, resp. rate 17, height 5\' 9"  (1.753 m), weight 89.5 kg, SpO2 96 %. He is awake and alert.  Chest is clear.  Heart is regular.  Abdomen soft.  He has some tenderness in his back.  Disposition: To skilled care facility.  He uses CPAP at night and that will need to continue.  He will have OT and PT and speech if needed.    Contact information for after-discharge care    [facility] .   Service: Skilled Nursing Contact information: [address] [place] [phone]              Signed: Alonza Bogus   06/22/2019, 8:42 AM

## 2019-06-22 NOTE — Progress Notes (Signed)
Patient left floor in stable condition via stretcher accompanied by [place] for discharge this afternoon. Wife at bedside for discharge. Donavan Foil, RN

## 2019-06-24 ENCOUNTER — Non-Acute Institutional Stay (SKILLED_NURSING_FACILITY): Payer: Medicare Other | Admitting: Adult Health

## 2019-06-24 ENCOUNTER — Encounter: Payer: Self-pay | Admitting: Adult Health

## 2019-06-24 DIAGNOSIS — G4733 Obstructive sleep apnea (adult) (pediatric): Secondary | ICD-10-CM

## 2019-06-24 DIAGNOSIS — M5136 Other intervertebral disc degeneration, lumbar region: Secondary | ICD-10-CM

## 2019-06-24 DIAGNOSIS — E785 Hyperlipidemia, unspecified: Secondary | ICD-10-CM | POA: Diagnosis not present

## 2019-06-24 DIAGNOSIS — I10 Essential (primary) hypertension: Secondary | ICD-10-CM

## 2019-06-24 DIAGNOSIS — R296 Repeated falls: Secondary | ICD-10-CM

## 2019-06-24 DIAGNOSIS — R5381 Other malaise: Secondary | ICD-10-CM

## 2019-06-24 DIAGNOSIS — K219 Gastro-esophageal reflux disease without esophagitis: Secondary | ICD-10-CM | POA: Diagnosis not present

## 2019-06-24 DIAGNOSIS — F0150 Vascular dementia without behavioral disturbance: Secondary | ICD-10-CM

## 2019-06-24 DIAGNOSIS — M51369 Other intervertebral disc degeneration, lumbar region without mention of lumbar back pain or lower extremity pain: Secondary | ICD-10-CM

## 2019-06-24 DIAGNOSIS — M1A09X0 Idiopathic chronic gout, multiple sites, without tophus (tophi): Secondary | ICD-10-CM

## 2019-06-24 DIAGNOSIS — M1A9XX0 Chronic gout, unspecified, without tophus (tophi): Secondary | ICD-10-CM | POA: Insufficient documentation

## 2019-06-24 NOTE — Progress Notes (Signed)
Location:    [facility] Room Number: 157/W Place of Service:  SNF (31)   CODE STATUS: DNR  Allergies  Allergen Reactions  . Penicillins Rash  . Sulfonamide Derivatives Rash  . Tetanus Toxoid Rash    Chief Complaint  Patient presents with  . Hospitalization Follow-up    Hospitalization Follow Up Visit    HPI:  He is a 85 year old man who has been hospitalized from 06-19-19 through 06-22-19. He had had about 17 falls at home in the past month. He is here due to increased weakness for strengthening and to improve his level of independence with his adls. He denies any uncontrolled pain; no changes in his appetite; he denies any insomnia or anxiety. His goal is to return home; he states that he  has a walker and wheelchair at home. He will continue to be followed for his chronic illnesses including: hypertension; gout; dementia.   Past Medical History:  Diagnosis Date  . Cancer (HCC)    melanoma-left side  . Chronic pain    legs and feet  . Coronary artery disease   . Dementia ([place])   . Diabetes mellitus without complication ([place])   . Foot drop   . GERD (gastroesophageal reflux disease)   . Gout   . Hyperlipidemia   . Hypertension   . OCD (obsessive compulsive disorder)   . PONV (postoperative nausea and vomiting)   . Psychosis ([place])   . Reflux   . Sarcoidosis   . Sleep apnea    cpap    Past Surgical History:  Procedure Laterality Date  . BACK SURGERY     cervical neck  . CATARACT EXTRACTION W/PHACO  01/05/2012   Procedure: CATARACT EXTRACTION PHACO AND INTRAOCULAR LENS PLACEMENT (IOC);  Surgeon: [name] Branch, MD;  Location: [facility];  Service: Ophthalmology;  Laterality: Left;  CDE 18.47  . CATARACT EXTRACTION W/PHACO  02/02/2012   Procedure: CATARACT EXTRACTION PHACO AND INTRAOCULAR LENS PLACEMENT (IOC);  Surgeon: [name] Branch, MD;  Location: [facility];  Service: Ophthalmology;  Laterality: Right;  CDE:15.38  . CHOLECYSTECTOMY    . GALLBLADDER SURGERY     . hemorhoidectomy    . MELANOMA EXCISION     left side-[name]  . TONSILLECTOMY      Social History   Socioeconomic History  . Marital status: Married    Spouse name: Not on file  . Number of children: Not on file  . Years of education: Not on file  . Highest education level: Not on file  Occupational History  . Not on file  Social Needs  . Financial resource strain: Not on file  . Food insecurity    Worry: Not on file    Inability: Not on file  . Transportation needs    Medical: Not on file    Non-medical: Not on file  Tobacco Use  . Smoking status: Former Smoker    Packs/day: 0.25    Years: 20.00    Pack years: 5.00    Types: Pipe    Quit date: 01/02/1974    Years since quitting: 45.5  . Smokeless tobacco: Never Used  Substance and Sexual Activity  . Alcohol use: No  . Drug use: No  . Sexual activity: Yes  Lifestyle  . Physical activity    Days per week: Not on file    Minutes per session: Not on file  . Stress: Not on file  Relationships  . Social connections    Talks  on phone: Not on file    Gets together: Not on file    Attends religious service: Not on file    Active member of club or organization: Not on file    Attends meetings of clubs or organizations: Not on file    Relationship status: Not on file  . Intimate partner violence    Fear of current or ex partner: Not on file    Emotionally abused: Not on file    Physically abused: Not on file    Forced sexual activity: Not on file  Other Topics Concern  . Not on file  Social History Narrative  . Not on file   Family History  Problem Relation Age of Onset  . Heart disease Other   . Arthritis Other   . Cancer Other   . Anesthesia problems Neg Hx   . Hypotension Neg Hx   . Malignant hyperthermia Neg Hx   . Pseudochol deficiency Neg Hx       VITAL SIGNS BP 118/68   Pulse 70   Temp (!) 97 F (36.1 C) (Oral)   Resp 20   Ht 5\' 9"  (1.753 m)   Wt 207 lb 8 oz (94.1 kg)   BMI 30.64 kg/m    Outpatient Encounter Medications as of 06/24/2019  Medication Sig  . acetaminophen (TYLENOL) 500 MG tablet Take 500 mg by mouth every 6 (six) hours as needed. For pain   . allopurinol (ZYLOPRIM) 300 MG tablet Take 300 mg by mouth daily.    [name] (NORVASC) 5 MG tablet Take 1 tablet (5 mg total) by mouth daily.  [name] EC 81 MG tablet Take 81 mg by mouth daily.  [name] (LIPITOR) 20 MG tablet Take 1 tablet (20 mg total) by mouth daily.  [name] donepezil (ARICEPT) 10 MG tablet Take 10 mg by mouth daily.  [name] HYDROcodone-acetaminophen (NORCO/VICODIN) 5-325 MG tablet 1 po q6h prn moderate pain  . lisinopril (ZESTRIL) 5 MG tablet Take 1 tablet (5 mg total) by mouth daily.  . meloxicam (MOBIC) 7.5 MG tablet Take 7.5 mg by mouth daily.    . Multiple Vitamin (MULITIVITAMIN WITH MINERALS) TABS Take 1 tablet by mouth daily.  . NON FORMULARY Diet: Regular,NAS, Consistent Carbohydrate  . ondansetron (ZOFRAN) 4 MG tablet Take 1 tablet (4 mg total) by mouth every 6 (six) hours as needed for nausea.  . pantoprazole (PROTONIX) 40 MG tablet Take 1 tablet (40 mg total) by mouth daily.  . [DISCONTINUED] nitroGLYCERIN (NITROSTAT) 0.4 MG SL tablet Place 1 tablet (0.4 mg total) under the tongue every 5 (five) minutes as needed for chest pain.   No facility-administered encounter medications on file as of 06/24/2019.      SIGNIFICANT DIAGNOSTIC EXAMS  TODAY;   06-19-19: chest x-[place]: Minimal right basilar subsegmental atelectasis   06-19-19: ct of head: Mild diffuse cortical atrophy. No acute intracranial abnormality seen.  06-19-19: ct of lumbar spine:  1. No acute/traumatic lumbar spine pathology. 2. Osteopenia with extensive multilevel degenerative changes of the spine. 3. Lumbar levoscoliosis and multilevel disc bulge and neural foraminal narrowing. MRI may provide better evaluation. Aortic Atherosclerosis   06-22-19: lumbar spine x-[place]: 1. Degenerative changes. 2. No evidence for acute  abnormality  LABS REVIEWED TODAY;   06-19-19: wbc 8.4; hgb 14.2; hct 44.4; mcv 93.7 plt 317; glucose 106; bun 26; creat 0.82; k+ 4.1; na++ 138; ca 8.8; liver normal albumin 3.4; urine culture no growth 06-20-19-: wbc 7.7; hgb 13.1 hct 42.2; mcv  94.8; plt 306; glucose 108; bun 24; creat 0.80; k+ 3.9; na++ 138; ca 8.8 ;liver normal albumin 3.3   Review of Systems  Constitutional: Negative for malaise/fatigue.  Respiratory: Negative for cough and shortness of breath.   Cardiovascular: Negative for chest pain, palpitations and leg swelling.  Gastrointestinal: Negative for abdominal pain, constipation and heartburn.  Musculoskeletal: Negative for back pain, joint pain and myalgias.  Skin: Negative.   Neurological: Negative for dizziness.  Psychiatric/Behavioral: The patient is not nervous/anxious.       Physical Exam Constitutional:      General: He is not in acute distress.    Appearance: He is well-developed. He is not diaphoretic.  Eyes:     Comments: History of bilateral cataract with lens implants   Neck:     Musculoskeletal: Neck supple.     Thyroid: No thyromegaly.  Cardiovascular:     Rate and Rhythm: Normal rate and regular rhythm.     Pulses: Normal pulses.     Heart sounds: Normal heart sounds.  Pulmonary:     Effort: Pulmonary effort is normal. No respiratory distress.     Breath sounds: Normal breath sounds.  Abdominal:     General: Bowel sounds are normal. There is no distension.     Palpations: Abdomen is soft.     Tenderness: There is no abdominal tenderness.  Musculoskeletal: Normal range of motion.     Right lower leg: No edema.     Left lower leg: No edema.  Lymphadenopathy:     Cervical: No cervical adenopathy.  Skin:    General: Skin is warm and dry.     Comments: DTI to left heel   Neurological:     Mental Status: He is alert. Mental status is at baseline.  Psychiatric:        Mood and Affect: Mood normal.      ASSESSMENT/ PLAN:  TODAY  1.  Essential hypertension: is stable b/p 118/68 will continue lisinopril 5 mg daily norvasc 5 mg daily and asa 81 mg daily   2. Sleep apnea; obstructive: is stable uses CPAP nightly   3. GERD without esophagitis: is stable will continue protonix 40 mg daily   4. Vascular dementia without behavioral  disturbance: is without change weight is 207 pounds; will continue aricept 10 mg nightly   5.  Degenerative disc disease , lumbar: is stable pain is presently managed: will continue mobic 7.5 mg daily and vicodin 5/325 mg every 6 hours as needed through 06-27-19.   6. Dyslipidemia: is stable will continue lipito 20 mg daily   7.  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 Physical deconditioning/ multiple falls: is presently stable; will continue therapy as directed to improve upon his ability to self perform his adls.      MD is aware of resident's narcotic use and is in agreement with current plan of care. We will attempt to wean resident as apropriate   Ok Edwards NP [facility]  Contact [phone] Monday through Friday 8am- 5pm  After hours call [phone]

## 2019-06-26 ENCOUNTER — Encounter: Payer: Self-pay | Admitting: Internal Medicine

## 2019-06-26 ENCOUNTER — Non-Acute Institutional Stay (SKILLED_NURSING_FACILITY): Payer: Medicare Other | Admitting: Internal Medicine

## 2019-06-26 DIAGNOSIS — M1A09X0 Idiopathic chronic gout, multiple sites, without tophus (tophi): Secondary | ICD-10-CM | POA: Diagnosis not present

## 2019-06-26 DIAGNOSIS — R5381 Other malaise: Secondary | ICD-10-CM

## 2019-06-26 DIAGNOSIS — R296 Repeated falls: Secondary | ICD-10-CM | POA: Diagnosis not present

## 2019-06-26 DIAGNOSIS — F0280 Dementia in other diseases classified elsewhere without behavioral disturbance: Secondary | ICD-10-CM

## 2019-06-26 DIAGNOSIS — G301 Alzheimer's disease with late onset: Secondary | ICD-10-CM

## 2019-06-26 DIAGNOSIS — I10 Essential (primary) hypertension: Secondary | ICD-10-CM

## 2019-06-26 DIAGNOSIS — E785 Hyperlipidemia, unspecified: Secondary | ICD-10-CM

## 2019-06-26 NOTE — Progress Notes (Signed)
: Provider:  Hennie Duos., MD Location:  [place] Room Number: 157-W Place of Service:  SNF ([phone])  PCP: Sinda Du, MD Patient Care Team: Sinda Du, MD as PCP - General (Internal Medicine)  Extended Emergency Contact Information Primary Emergency Contact: [facility] Address: [address]          [place], [place] [address] [place] Phone: [phone] Work Phone: [phone] Relation: Spouse     Allergies: Penicillins, Sulfonamide derivatives, and Tetanus toxoid  Chief Complaint  Patient presents with  . New Admit To SNF    New admission to [facility] SNF    HPI: Patient is an 85 y.o. male with dementia, CAD, hyperlipidemia, hypertension, sleep apnea on CPAP, who was brought to the hospital per EMS for having difficulty walking and falls.  It appeared that over the past 2 weeks patient had an 8017 falls.  Per wife patient has not been out of bed for a week.  In the ED CT of the head was unremarkable and CT lumbar spine showed multilevel osteoarthritis and DJD.  Patient denied chest pain shortness of breath nausea vomiting diarrhea abdominal pain dysuria coughing.  Patient was admitted to [facility] from 8/19-22 where he was found to have dehydration and was treated with IV fluid.  He was evaluated by physical therapy and felt that he would benefit from skilled nursing facility for PT OT.  Patient is admitted to skilled nursing facility for OT/PT.  While at skilled nursing facility patient will be followed for gout treated with allopurinol, hypertension treated with Norvasc and lisinopril and dementia treated with Aricept.  Past Medical History:  Diagnosis Date  . Cancer (HCC)    melanoma-left side  . Chronic pain    legs and feet  . Coronary artery disease   . Dementia ([place])   . Diabetes mellitus without complication ([place])   . Foot drop   . GERD (gastroesophageal reflux disease)   . Gout   . Hyperlipidemia    . Hypertension   . OCD (obsessive compulsive disorder)   . PONV (postoperative nausea and vomiting)   . Psychosis ([place])   . Reflux   . Sarcoidosis   . Sleep apnea    cpap    Past Surgical History:  Procedure Laterality Date  . BACK SURGERY     cervical neck  . CATARACT EXTRACTION W/PHACO  01/05/2012   Procedure: CATARACT EXTRACTION PHACO AND INTRAOCULAR LENS PLACEMENT (IOC);  Surgeon: [name] Branch, MD;  Location: [facility];  Service: Ophthalmology;  Laterality: Left;  CDE 18.47  . CATARACT EXTRACTION W/PHACO  02/02/2012   Procedure: CATARACT EXTRACTION PHACO AND INTRAOCULAR LENS PLACEMENT (IOC);  Surgeon: [name] Branch, MD;  Location: [facility];  Service: Ophthalmology;  Laterality: Right;  CDE:15.38  . CHOLECYSTECTOMY    . GALLBLADDER SURGERY    . hemorhoidectomy    . MELANOMA EXCISION     left side-[name]  . TONSILLECTOMY      Allergies as of 06/26/2019      Reactions   Penicillins Rash   Sulfonamide Derivatives Rash   Tetanus Toxoid Rash      Medication List    Notice   This visit is during an admission. Changes to the med list made in this visit will be reflected in the After Visit Summary of the admission.     No orders of the defined types were placed in this encounter.   Immunization History  Administered Date(s) Administered  .  Influenza Whole 08/15/2007    Social History   Tobacco Use  . Smoking status: Former Smoker    Packs/day: 0.25    Years: 20.00    Pack years: 5.00    Types: Pipe    Quit date: 01/02/1974    Years since quitting: 45.5  . Smokeless tobacco: Never Used  Substance Use Topics  . Alcohol use: No    Family history is   Family History  Problem Relation Age of Onset  . Heart disease Other   . Arthritis Other   . Cancer Other   . Anesthesia problems Neg Hx   . Hypotension Neg Hx   . Malignant hyperthermia Neg Hx   . Pseudochol deficiency Neg Hx       Review of Systems unable secondary to dementia; nursing-no acute concerns     Vitals:   06/26/19 0919  BP: 138/68  Pulse: 72  Resp: 20  Temp: 97.6 F (36.4 C)    SpO2 Readings from Last 1 Encounters:  06/22/19 96%   Body mass index is 30.66 kg/m.     Physical Exam  GENERAL APPEARANCE: Alert, minimally conversant,  No acute distress.  SKIN: No diaphoresis rash HEAD: Normocephalic, atraumatic  EYES: Conjunctiva/lids clear. Pupils round, reactive. EOMs intact.  EARS: External exam WNL, canals clear. Hearing grossly normal.  NOSE: No deformity or discharge.  MOUTH/THROAT: Lips w/o lesions  RESPIRATORY: Breathing is even, unlabored. Lung sounds are clear   CARDIOVASCULAR: Heart RRR no murmurs, rubs or gallops. No peripheral edema.   GASTROINTESTINAL: Abdomen is soft, non-tender, not distended w/ normal bowel sounds. GENITOURINARY: Bladder non tender, not distended  MUSCULOSKELETAL: No abnormal joints or musculature NEUROLOGIC:  Cranial nerves 2-12 grossly intact. Moves all extremities  PSYCHIATRIC: Mood and affect appropriate to situation, no behavioral issues  Patient Active Problem List   Diagnosis Date Noted  . Dyslipidemia 06/24/2019  . Vascular dementia without behavioral disturbance ([place]) 06/24/2019  . Chronic gout without tophus 06/24/2019  . Physical deconditioning 06/24/2019  . Multiple falls 06/20/2019  . Arthritis of knee, left 01/03/2013  . Leg weakness, bilateral 01/03/2013  . Degenerative disc disease, lumbar 01/03/2013  . Coronary artery disease 09/13/2011  . LEUKOCYTOSIS UNSPECIFIED 09/04/2008  . HYPERGLYCEMIA 09/04/2008  . SLEEP APNEA, OBSTRUCTIVE 09/10/2007  . RHINITIS, CHRONIC 05/16/2007  . 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 Essential hypertension 05/02/2007  . BRONCHITIS, CHRONIC NOS 05/02/2007  . GERD 05/02/2007      Labs reviewed: Basic Metabolic Panel:    Component Value Date/Time   NA 136 06/21/2019 0556   K 3.6 06/21/2019 0556   CL 105 06/21/2019 0556   CO2 24 06/21/2019 0556   GLUCOSE 94 06/21/2019 0556    BUN 19 06/21/2019 0556   CREATININE 0.65 06/21/2019 0556   CALCIUM 8.5 (L) 06/21/2019 0556   PROT 6.1 (L) 06/20/2019 0634   ALBUMIN 3.3 (L) 06/20/2019 0634   AST 14 (L) 06/20/2019 0634   ALT 15 06/20/2019 0634   ALKPHOS 74 06/20/2019 0634   BILITOT 0.7 06/20/2019 0634   GFRNONAA >60 06/21/2019 0556   GFRAA >60 06/21/2019 0556    Recent Labs    06/19/19 1504 06/20/19 0634 06/21/19 0556  NA 138 138 136  K 4.1 3.9 3.6  CL 105 106 105  CO2 25 25 24   GLUCOSE 106* 108* 94  BUN 26* 24* 19  CREATININE 0.82 0.80 0.65  CALCIUM 8.8* 8.8* 8.5*   Liver Function Tests: Recent Labs    06/19/19 1504 06/20/19 [id number]  AST 17 14*  ALT 17 15  ALKPHOS 79 74  BILITOT 0.4 0.7  PROT 6.6 6.1*  ALBUMIN 3.4* 3.3*   No results for input(s): LIPASE, AMYLASE in the last 8760 hours. No results for input(s): AMMONIA in the last 8760 hours. CBC: Recent Labs    06/19/19 1504 06/20/19 0634  WBC 8.4 7.7  NEUTROABS 5.5  --   HGB 14.2 13.1  HCT 44.4 42.2  MCV 93.7 94.8  PLT 317 306   Lipid No results for input(s): CHOL, HDL, LDLCALC, TRIG in the last 8760 hours.  Cardiac Enzymes: Recent Labs    06/19/19 1504  CKTOTAL 41*   BNP: No results for input(s): BNP in the last 8760 hours. No results found for: MICROALBUR No results found for: HGBA1C No results found for: TSH No results found for: VITAMINB12 No results found for: FOLATE No results found for: IRON, TIBC, FERRITIN  Imaging and Procedures obtained prior to SNF admission: Dg Lumbar Spine 2-3 Views  Result Date: 06/22/2019 CLINICAL DATA:  Patient has had many falls, pt had CT L-spine 06-19-2019 in ER before admission, Patient stated he fell yesterday EXAM: LUMBAR SPINE - 2-3 VIEW COMPARISON:  CT of the lumbar spine on 06/19/2019 FINDINGS: Extensive degenerative changes are seen throughout the LOWER thoracic and lumbar spine. No acute fracture or traumatic subluxation identified. No suspicious lytic or blastic lesions are  identified. Convex LEFT scoliosis of the thoracolumbar spine. IMPRESSION: 1. Degenerative changes. 2. No evidence for acute abnormality. Electronically Signed   By: Nolon Nations M.D.   On: 06/22/2019 13:13     Not all labs, radiology exams or other studies done during hospitalization come through on my EPIC note; however they are reviewed by me.    Assessment and Plan  Generalized weakness/multiple falls-secondary to deconditioning SNF- admitted for OT/PT  Dehydration-improved with IV fluids SNF- will encourage p.o. intake and follow-up BMP  Gout SNF-no acute flares at this time; continue allopurinol 300 mg daily  Hypertension SNF- controlled; continue Norvasc 5 mg daily, and lisinopril 5 mg daily  Dementia SNF- appears moderate to severe; continue Aricept 10 mg daily  Hyperlipidemia SNF-not stated as uncontrolled; continue Lipitor 20 mg daily   Time spent greater than 45 minutes;> 50% of time with patient was spent reviewing records, labs, tests and studies, counseling and developing plan of care  Hennie Duos, MD

## 2019-06-27 ENCOUNTER — Non-Acute Institutional Stay (SKILLED_NURSING_FACILITY): Payer: Medicare Other | Admitting: Adult Health

## 2019-06-27 ENCOUNTER — Encounter: Payer: Self-pay | Admitting: Adult Health

## 2019-06-27 DIAGNOSIS — M5136 Other intervertebral disc degeneration, lumbar region: Secondary | ICD-10-CM | POA: Diagnosis not present

## 2019-06-27 DIAGNOSIS — M51369 Other intervertebral disc degeneration, lumbar region without mention of lumbar back pain or lower extremity pain: Secondary | ICD-10-CM

## 2019-06-27 NOTE — Progress Notes (Signed)
Location:  [place] Room Number: 157-W Place of Service:  SNF (31)   CODE STATUS: DNR  Allergies  Allergen Reactions  . Penicillins Rash  . Sulfonamide Derivatives Rash  . Tetanus Toxoid Rash    Chief Complaint  Patient presents with  . Acute Visit    Patient is seen for pain management.    HPI:  He currently has vicodin ordered as needed for pain management due to degenerative disc disease. He has not used this medication since his admission to this facility. He denies any uncontrolled pain; no insomnia; no anxiety.   Past Medical History:  Diagnosis Date  . Cancer (HCC)    melanoma-left side  . Chronic pain    legs and feet  . Coronary artery disease   . Dementia ([place])   . Diabetes mellitus without complication ([place])   . Foot drop   . GERD (gastroesophageal reflux disease)   . Gout   . Hyperlipidemia   . Hypertension   . OCD (obsessive compulsive disorder)   . PONV (postoperative nausea and vomiting)   . Psychosis ([place])   . Reflux   . Sarcoidosis   . Sleep apnea    cpap    Past Surgical History:  Procedure Laterality Date  . BACK SURGERY     cervical neck  . CATARACT EXTRACTION W/PHACO  01/05/2012   Procedure: CATARACT EXTRACTION PHACO AND INTRAOCULAR LENS PLACEMENT (IOC);  Surgeon: [name] Branch, MD;  Location: [facility];  Service: Ophthalmology;  Laterality: Left;  CDE 18.47  . CATARACT EXTRACTION W/PHACO  02/02/2012   Procedure: CATARACT EXTRACTION PHACO AND INTRAOCULAR LENS PLACEMENT (IOC);  Surgeon: [name] Branch, MD;  Location: [facility];  Service: Ophthalmology;  Laterality: Right;  CDE:15.38  . CHOLECYSTECTOMY    . GALLBLADDER SURGERY    . hemorhoidectomy    . MELANOMA EXCISION     left side-[name]  . TONSILLECTOMY      Social History   Socioeconomic History  . Marital status: Married    Spouse name: Not on file  . Number of children: Not on file  . Years of education: Not on file  . Highest education level: Not on file   Occupational History  . Not on file  Social Needs  . Financial resource strain: Not on file  . Food insecurity    Worry: Not on file    Inability: Not on file  . Transportation needs    Medical: Not on file    Non-medical: Not on file  Tobacco Use  . Smoking status: Former Smoker    Packs/day: 0.25    Years: 20.00    Pack years: 5.00    Types: Pipe    Quit date: 01/02/1974    Years since quitting: 45.5  . Smokeless tobacco: Never Used  Substance and Sexual Activity  . Alcohol use: No  . Drug use: No  . Sexual activity: Yes  Lifestyle  . Physical activity    Days per week: Not on file    Minutes per session: Not on file  . Stress: Not on file  Relationships  . Social Herbalist on phone: Not on file    Gets together: Not on file    Attends religious service: Not on file    Active member of club or organization: Not on file    Attends meetings of clubs or organizations: Not on file    Relationship status: Not on file  . Intimate partner  violence    Fear of current or ex partner: Not on file    Emotionally abused: Not on file    Physically abused: Not on file    Forced sexual activity: Not on file  Other Topics Concern  . Not on file  Social History Narrative   Former pipe smoker, quit many years ago.   Lived at home with wife prior to SNF admission.     Family History  Problem Relation Age of Onset  . Heart disease Other   . Arthritis Other   . Cancer Other   . Anesthesia problems Neg Hx   . Hypotension Neg Hx   . Malignant hyperthermia Neg Hx   . Pseudochol deficiency Neg Hx       VITAL SIGNS BP 138/64   Pulse 69   Temp 98.8 F (37.1 C) (Oral)   Resp 20   Ht 5\' 9"  (1.753 m)   Wt 207 lb 9.6 oz (94.2 kg)   BMI 30.66 kg/m   Outpatient Encounter Medications as of 06/27/2019  Medication Sig  . acetaminophen (TYLENOL) 500 MG tablet Take 500 mg by mouth every 6 (six) hours as needed. For pain   . allopurinol (ZYLOPRIM) 300 MG tablet Take 300  mg by mouth daily.    [name] (NORVASC) 5 MG tablet Take 1 tablet (5 mg total) by mouth daily.  [name] EC 81 MG tablet Take 81 mg by mouth daily.  [name] (LIPITOR) 20 MG tablet Take 1 tablet (20 mg total) by mouth daily.  Roseanne Kaufman [place]-Castor Oil (VENELEX) OINT Apply 1 application topically every 8 (eight) hours. Apply to sacrum, coccyx, and bilateral buttocks each shift and prn  . donepezil (ARICEPT) 10 MG tablet Take 10 mg by mouth at bedtime.   [name] (ZESTRIL) 5 MG tablet Take 1 tablet (5 mg total) by mouth daily.  . meloxicam (MOBIC) 7.5 MG tablet Take 7.5 mg by mouth daily.    . Multiple Vitamin (MULITIVITAMIN WITH MINERALS) TABS Take 1 tablet by mouth daily.  . NON FORMULARY Diet: Regular,NAS, Consistent Carbohydrate  . ondansetron (ZOFRAN) 4 MG tablet Take 1 tablet (4 mg total) by mouth every 6 (six) hours as needed for nausea.  . pantoprazole (PROTONIX) 40 MG tablet Take 1 tablet (40 mg total) by mouth daily.  . [DISCONTINUED] HYDROcodone-acetaminophen (NORCO/VICODIN) 5-325 MG tablet 1 po q6h prn moderate pain   No facility-administered encounter medications on file as of 06/27/2019.      SIGNIFICANT DIAGNOSTIC EXAMS   PREVIOUS;   06-19-19: chest x-[place]: Minimal right basilar subsegmental atelectasis   06-19-19: ct of head: Mild diffuse cortical atrophy. No acute intracranial abnormality seen.  06-19-19: ct of lumbar spine:  1. No acute/traumatic lumbar spine pathology. 2. Osteopenia with extensive multilevel degenerative changes of the spine. 3. Lumbar levoscoliosis and multilevel disc bulge and neural foraminal narrowing. MRI may provide better evaluation. Aortic Atherosclerosis   06-22-19: lumbar spine x-[place]: 1. Degenerative changes. 2. No evidence for acute abnormality  NO NEW EXAMS.   LABS REVIEWED PREVIOUS;   06-19-19: wbc 8.4; hgb 14.2; hct 44.4; mcv 93.7 plt 317; glucose 106; bun 26; creat 0.82; k+ 4.1; na++ 138; ca 8.8; liver normal albumin 3.4;  urine culture no growth 06-20-19-: wbc 7.7; hgb 13.1 hct 42.2; mcv 94.8; plt 306; glucose 108; bun 24; creat 0.80; k+ 3.9; na++ 138; ca 8.8 ;liver normal albumin 3.3   NO NEW LABS.   Review of Systems  Constitutional: Negative for malaise/fatigue.  Respiratory: Negative for cough and shortness of breath.   Cardiovascular: Negative for chest pain, palpitations and leg swelling.  Gastrointestinal: Negative for abdominal pain, constipation and heartburn.  Musculoskeletal: Negative for back pain, joint pain and myalgias.  Skin: Negative.   Neurological: Negative for dizziness.  Psychiatric/Behavioral: The patient is not nervous/anxious.     Physical Exam Constitutional:      General: He is not in acute distress.    Appearance: He is well-developed. He is not diaphoretic.  Eyes:     Comments: History of bilateral cataract with lens implants    Neck:     Musculoskeletal: Neck supple.     Thyroid: No thyromegaly.  Cardiovascular:     Rate and Rhythm: Normal rate and regular rhythm.     Pulses: Normal pulses.     Heart sounds: Normal heart sounds.  Pulmonary:     Effort: Pulmonary effort is normal. No respiratory distress.     Breath sounds: Normal breath sounds.  Abdominal:     General: Bowel sounds are normal. There is no distension.     Palpations: Abdomen is soft.     Tenderness: There is no abdominal tenderness.  Musculoskeletal: Normal range of motion.     Left lower leg: No edema.  Lymphadenopathy:     Cervical: No cervical adenopathy.  Skin:    General: Skin is warm and dry.  Neurological:     Mental Status: He is alert. Mental status is at baseline.  Psychiatric:        Mood and Affect: Mood normal.      ASSESSMENT/ PLAN:  TODAY:   1. Degenerative disc disease lumbar spine is stable will stop vicodin due to non use will continue other medications and will monitor his status.   MD is aware of resident's narcotic use and is in agreement with current plan of care.  We will attempt to wean resident as appropriate.  Ok Edwards NP [facility]  Contact [phone] Monday through Friday 8am- 5pm  After hours call [phone]

## 2019-07-01 ENCOUNTER — Non-Acute Institutional Stay (SKILLED_NURSING_FACILITY): Payer: Medicare Other | Admitting: Adult Health

## 2019-07-01 ENCOUNTER — Encounter: Payer: Self-pay | Admitting: Adult Health

## 2019-07-01 ENCOUNTER — Encounter (HOSPITAL_COMMUNITY)
Admission: RE | Admit: 2019-07-01 | Discharge: 2019-07-01 | Disposition: A | Discharge status: Home / Self Care | Payer: Medicare Other | Source: Ambulatory Visit | Attending: Internal Medicine | Admitting: Internal Medicine

## 2019-07-01 DIAGNOSIS — I10 Essential (primary) hypertension: Secondary | ICD-10-CM

## 2019-07-01 DIAGNOSIS — G4733 Obstructive sleep apnea (adult) (pediatric): Secondary | ICD-10-CM

## 2019-07-01 DIAGNOSIS — Z20828 Contact with and (suspected) exposure to other viral communicable diseases: Secondary | ICD-10-CM | POA: Insufficient documentation

## 2019-07-01 DIAGNOSIS — K219 Gastro-esophageal reflux disease without esophagitis: Secondary | ICD-10-CM | POA: Diagnosis not present

## 2019-07-01 LAB — SARS CORONAVIRUS 2 BY RT PCR (HOSPITAL ORDER, PERFORMED IN ~~LOC~~ HOSPITAL LAB): SARS Coronavirus 2: NEGATIVE

## 2019-07-01 NOTE — Progress Notes (Signed)
Location:    [place] Room Number: 157/W Place of Service:  SNF (31)   CODE STATUS: DNR  Allergies  Allergen Reactions  . Penicillins Rash  . Sulfonamide Derivatives Rash  . Tetanus Toxoid Rash    Chief Complaint  Patient presents with  . Medical Management of Chronic Issues         Essential hypertension:  Sleep apnea, obstructive: [name] GERD without esophagitis:   Weekly follow up for the first 30 days post hospitalization.    HPI:  He is a 85 year old short term rehab patient being seen for the management of his chronic illnesses: hypertension; sleep apnea and gerd. He denies any uncontrolled joint pain. His appetite is good denies heart burn. He denies any excessive lethargy   Past Medical History:  Diagnosis Date  . Cancer (HCC)    melanoma-left side  . Chronic pain    legs and feet  . Coronary artery disease   . Dementia ([place])   . Diabetes mellitus without complication ([place])   . Foot drop   . GERD (gastroesophageal reflux disease)   . Gout   . Hyperlipidemia   . Hypertension   . OCD (obsessive compulsive disorder)   . PONV (postoperative nausea and vomiting)   . Psychosis ([place])   . Reflux   . Sarcoidosis   . Sleep apnea    cpap    Past Surgical History:  Procedure Laterality Date  . BACK SURGERY     cervical neck  . CATARACT EXTRACTION W/PHACO  01/05/2012   Procedure: CATARACT EXTRACTION PHACO AND INTRAOCULAR LENS PLACEMENT (IOC);  Surgeon: [name] Branch, MD;  Location: [facility];  Service: Ophthalmology;  Laterality: Left;  CDE 18.47  . CATARACT EXTRACTION W/PHACO  02/02/2012   Procedure: CATARACT EXTRACTION PHACO AND INTRAOCULAR LENS PLACEMENT (IOC);  Surgeon: [name] Branch, MD;  Location: [facility];  Service: Ophthalmology;  Laterality: Right;  CDE:15.38  . CHOLECYSTECTOMY    . GALLBLADDER SURGERY    . hemorhoidectomy    . MELANOMA EXCISION     left side-[name]  . TONSILLECTOMY      Social History   Socioeconomic History  . Marital  status: Married    Spouse name: Not on file  . Number of children: Not on file  . Years of education: Not on file  . Highest education level: Not on file  Occupational History  . Not on file  Social Needs  . Financial resource strain: Not on file  . Food insecurity    Worry: Not on file    Inability: Not on file  . Transportation needs    Medical: Not on file    Non-medical: Not on file  Tobacco Use  . Smoking status: Former Smoker    Packs/day: 0.25    Years: 20.00    Pack years: 5.00    Types: Pipe    Quit date: 01/02/1974    Years since quitting: 45.5  . Smokeless tobacco: Never Used  Substance and Sexual Activity  . Alcohol use: No  . Drug use: No  . Sexual activity: Yes  Lifestyle  . Physical activity    Days per week: Not on file    Minutes per session: Not on file  . Stress: Not on file  Relationships  . Social Herbalist on phone: Not on file    Gets together: Not on file    Attends religious service: Not on file    Active  member of club or organization: Not on file    Attends meetings of clubs or organizations: Not on file    Relationship status: Not on file  . Intimate partner violence    Fear of current or ex partner: Not on file    Emotionally abused: Not on file    Physically abused: Not on file    Forced sexual activity: Not on file  Other Topics Concern  . Not on file  Social History Narrative   Former pipe smoker, quit many years ago.   Lived at home with wife prior to SNF admission.     Family History  Problem Relation Age of Onset  . Heart disease Other   . Arthritis Other   . Cancer Other   . Anesthesia problems Neg Hx   . Hypotension Neg Hx   . Malignant hyperthermia Neg Hx   . Pseudochol deficiency Neg Hx       VITAL SIGNS BP 132/61   Pulse 76   Temp (!) 97.4 F (36.3 C) (Oral)   Resp 20   Ht 5\' 9"  (1.753 m)   Wt 207 lb 9.6 oz (94.2 kg)   BMI 30.66 kg/m   Outpatient Encounter Medications as of 07/01/2019   Medication Sig  . acetaminophen (TYLENOL) 500 MG tablet Take 500 mg by mouth every 6 (six) hours as needed. For pain   . allopurinol (ZYLOPRIM) 300 MG tablet Take 300 mg by mouth daily.    [name] (NORVASC) 5 MG tablet Take 1 tablet (5 mg total) by mouth daily.  [name] EC 81 MG tablet Take 81 mg by mouth daily.  [name] (LIPITOR) 20 MG tablet Take 1 tablet (20 mg total) by mouth daily.  Roseanne Kaufman [place]-Castor Oil (VENELEX) OINT Apply 1 application topically every 8 (eight) hours. Apply to sacrum, coccyx, and bilateral buttocks each shift and prn  . donepezil (ARICEPT) 10 MG tablet Take 10 mg by mouth at bedtime.   [name] (ZESTRIL) 5 MG tablet Take 1 tablet (5 mg total) by mouth daily.  . meloxicam (MOBIC) 7.5 MG tablet Take 7.5 mg by mouth daily.    . Multiple Vitamin (MULITIVITAMIN WITH MINERALS) TABS Take 1 tablet by mouth daily.  . NON FORMULARY Diet: Regular,NAS, Consistent Carbohydrate  . NON FORMULARY CPAP while sleeping. May use CPAP from home at previous home settings. Every Shift Day, Evening, Night  . NON FORMULARY Clean CPAP mask and tubing with baby shampoo and rinse well with warm water weekly and PRN Special Instructions: Clean weekly and PRN Once A Day on Tue 03:15 PM - 11:15 PM  . ondansetron (ZOFRAN) 4 MG tablet Take 1 tablet (4 mg total) by mouth every 6 (six) hours as needed for nausea.  . pantoprazole (PROTONIX) 40 MG tablet Take 1 tablet (40 mg total) by mouth daily.  . Skin Protectants, Misc. (NO-STING SKIN-PREP EX) Apply skin prep to right heel qshift for prevention. Every Shift  . Vitamins A & D (VITAMIN A & D) ointment Apply A&D ointment to right foot and ankle qshift & prn for prevention. Every Shift  . Wound Dressings (ALLEVYN HEEL) PADS Apply allevyn foam dressing to left heel and change q3days & prn until wound left lateral heel is resolved. Once A Day Every 3 Days 07:15 AM - 03:15 PM   No facility-administered encounter medications on  file as of 07/01/2019.      SIGNIFICANT DIAGNOSTIC EXAMS   PREVIOUS;   06-19-19: chest x-[place]: Minimal  right basilar subsegmental atelectasis   06-19-19: ct of head: Mild diffuse cortical atrophy. No acute intracranial abnormality seen.  06-19-19: ct of lumbar spine:  1. No acute/traumatic lumbar spine pathology. 2. Osteopenia with extensive multilevel degenerative changes of the spine. 3. Lumbar levoscoliosis and multilevel disc bulge and neural foraminal narrowing. MRI may provide better evaluation. Aortic Atherosclerosis   06-22-19: lumbar spine x-[place]: 1. Degenerative changes. 2. No evidence for acute abnormality  NO NEW EXAMS.   LABS REVIEWED PREVIOUS;   06-19-19: wbc 8.4; hgb 14.2; hct 44.4; mcv 93.7 plt 317; glucose 106; bun 26; creat 0.82; k+ 4.1; na++ 138; ca 8.8; liver normal albumin 3.4; urine culture no growth 06-20-19-: wbc 7.7; hgb 13.1 hct 42.2; mcv 94.8; plt 306; glucose 108; bun 24; creat 0.80; k+ 3.9; na++ 138; ca 8.8 ;liver normal albumin 3.3   NO NEW LABS.   Review of Systems  Constitutional: Negative for malaise/fatigue.  Respiratory: Negative for cough and shortness of breath.   Cardiovascular: Negative for chest pain, palpitations and leg swelling.  Gastrointestinal: Negative for abdominal pain, constipation and heartburn.  Musculoskeletal: Negative for back pain, joint pain and myalgias.  Skin: Negative.   Neurological: Negative for dizziness.  Psychiatric/Behavioral: The patient is not nervous/anxious.     Physical Exam Constitutional:      General: He is not in acute distress.    Appearance: He is well-developed. He is not diaphoretic.  Eyes:     Comments: History of bilateral cataract with lens implants     Neck:     Musculoskeletal: Neck supple.     Thyroid: No thyromegaly.  Cardiovascular:     Rate and Rhythm: Normal rate and regular rhythm.     Pulses: Normal pulses.     Heart sounds: Normal heart sounds.  Pulmonary:     Effort: Pulmonary  effort is normal. No respiratory distress.     Breath sounds: Normal breath sounds.  Abdominal:     General: Bowel sounds are normal. There is no distension.     Palpations: Abdomen is soft.     Tenderness: There is no abdominal tenderness.  Musculoskeletal: Normal range of motion.     Right lower leg: No edema.     Left lower leg: No edema.  Lymphadenopathy:     Cervical: No cervical adenopathy.  Skin:    General: Skin is warm and dry.  Neurological:     Mental Status: He is alert. Mental status is at baseline.  Psychiatric:        Mood and Affect: Mood normal.      ASSESSMENT/ PLAN:  TODAY  1. Essential hypertension: is stable b/p 132/60 will continue lisinopril 5 mg daily norvasc 5 mg daily and asa 81 mg daily   2. Sleep apnea, obstructive: is stable uses CPAP nightly   3. GERD without esophagitis: is stable will continue protonix 40 mg daily   PREVIOUS   4. Vascular dementia without behavioral  disturbance: is without change weight is 207 pounds; will continue aricept 10 mg nightly   5.  Degenerative disc disease , lumbar: is stable pain is presently managed: will continue mobic 7.5 mg daily his vicodin was stopped due to non use  6. Dyslipidemia: is stable will continue lipito 20 mg daily   7.  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 Physical deconditioning/ multiple falls: is presently stable; will continue therapy as directed to improve upon his  ability to self perform his adls.      MD is aware of resident's narcotic use and is in agreement with current plan of care. We will attempt to wean resident as appropriate.  Ok Edwards NP [facility]  Contact [phone] Monday through Friday 8am- 5pm  After hours call [phone]

## 2019-07-03 ENCOUNTER — Encounter: Payer: Self-pay | Admitting: Adult Health

## 2019-07-03 ENCOUNTER — Non-Acute Institutional Stay (SKILLED_NURSING_FACILITY): Payer: Medicare Other | Admitting: Adult Health

## 2019-07-03 DIAGNOSIS — M1A09X0 Idiopathic chronic gout, multiple sites, without tophus (tophi): Secondary | ICD-10-CM

## 2019-07-03 DIAGNOSIS — R5381 Other malaise: Secondary | ICD-10-CM

## 2019-07-03 DIAGNOSIS — F0150 Vascular dementia without behavioral disturbance: Secondary | ICD-10-CM

## 2019-07-03 NOTE — Progress Notes (Signed)
Location:    [place] Room Number: 132/P Place of Service:  SNF (31)   CODE STATUS: DNR  Allergies  Allergen Reactions  . Penicillins Rash  . Sulfonamide Derivatives Rash  . Tetanus Toxoid Rash    Chief Complaint  Patient presents with  . Acute Visit    Care Plan Meeting    HPI:  We have come together for his care plan meeting. He has family present via phone. BIMS 9/15 mood 6/30. He has lost 4 pounds since his admission. He requires 2 max assist in therapy. He is unable to roll on his side; has poor sitting balance; poor hand coordination. He does have a foot brace. He is incontinent of bladder and bowel. More than likely this does represent his need for log term skilled nursing.    Past Medical History:  Diagnosis Date  . Cancer (HCC)    melanoma-left side  . Chronic pain    legs and feet  . Coronary artery disease   . Dementia ([place])   . Diabetes mellitus without complication ([place])   . Foot drop   . GERD (gastroesophageal reflux disease)   . Gout   . Hyperlipidemia   . Hypertension   . OCD (obsessive compulsive disorder)   . PONV (postoperative nausea and vomiting)   . Psychosis ([place])   . Reflux   . Sarcoidosis   . Sleep apnea    cpap    Past Surgical History:  Procedure Laterality Date  . BACK SURGERY     cervical neck  . CATARACT EXTRACTION W/PHACO  01/05/2012   Procedure: CATARACT EXTRACTION PHACO AND INTRAOCULAR LENS PLACEMENT (IOC);  Surgeon: [name] Branch, MD;  Location: [facility];  Service: Ophthalmology;  Laterality: Left;  CDE 18.47  . CATARACT EXTRACTION W/PHACO  02/02/2012   Procedure: CATARACT EXTRACTION PHACO AND INTRAOCULAR LENS PLACEMENT (IOC);  Surgeon: [name] Branch, MD;  Location: [facility];  Service: Ophthalmology;  Laterality: Right;  CDE:15.38  . CHOLECYSTECTOMY    . GALLBLADDER SURGERY    . hemorhoidectomy    . MELANOMA EXCISION     left side-[name]  . TONSILLECTOMY      Social History   Socioeconomic History  .  Marital status: Married    Spouse name: Not on file  . Number of children: Not on file  . Years of education: Not on file  . Highest education level: Not on file  Occupational History  . Not on file  Social Needs  . Financial resource strain: Not on file  . Food insecurity    Worry: Not on file    Inability: Not on file  . Transportation needs    Medical: Not on file    Non-medical: Not on file  Tobacco Use  . Smoking status: Former Smoker    Packs/day: 0.25    Years: 20.00    Pack years: 5.00    Types: Pipe    Quit date: 01/02/1974    Years since quitting: 45.5  . Smokeless tobacco: Never Used  Substance and Sexual Activity  . Alcohol use: No  . Drug use: No  . Sexual activity: Yes  Lifestyle  . Physical activity    Days per week: Not on file    Minutes per session: Not on file  . Stress: Not on file  Relationships  . Social Herbalist on phone: Not on file    Gets together: Not on file    Attends religious service:  Not on file    Active member of club or organization: Not on file    Attends meetings of clubs or organizations: Not on file    Relationship status: Not on file  . Intimate partner violence    Fear of current or ex partner: Not on file    Emotionally abused: Not on file    Physically abused: Not on file    Forced sexual activity: Not on file  Other Topics Concern  . Not on file  Social History Narrative   Former pipe smoker, quit many years ago.   Lived at home with wife prior to SNF admission.     Family History  Problem Relation Age of Onset  . Heart disease Other   . Arthritis Other   . Cancer Other   . Anesthesia problems Neg Hx   . Hypotension Neg Hx   . Malignant hyperthermia Neg Hx   . Pseudochol deficiency Neg Hx       VITAL SIGNS BP (!) 154/79   Pulse 77   Temp 98.7 F (37.1 C) (Oral)   Resp 20   Ht 5\' 9"  (1.753 m)   Wt 203 lb (92.1 kg)   BMI 29.98 kg/m   Outpatient Encounter Medications as of 07/03/2019   Medication Sig  . acetaminophen (TYLENOL) 500 MG tablet Take 500 mg by mouth every 6 (six) hours as needed. For pain   . allopurinol (ZYLOPRIM) 300 MG tablet Take 300 mg by mouth daily.    [name] (NORVASC) 5 MG tablet Take 1 tablet (5 mg total) by mouth daily.  [name] EC 81 MG tablet Take 81 mg by mouth daily.  [name] (LIPITOR) 20 MG tablet Take 1 tablet (20 mg total) by mouth daily.  Roseanne Kaufman [place]-Castor Oil (VENELEX) OINT Apply 1 application topically every 8 (eight) hours. Apply to sacrum, coccyx, and bilateral buttocks each shift and prn  . donepezil (ARICEPT) 10 MG tablet Take 10 mg by mouth at bedtime.   [name] (ZESTRIL) 5 MG tablet Take 1 tablet (5 mg total) by mouth daily.  . meloxicam (MOBIC) 7.5 MG tablet Take 7.5 mg by mouth daily.    . Multiple Vitamin (MULITIVITAMIN WITH MINERALS) TABS Take 1 tablet by mouth daily.  . NON FORMULARY Diet: Regular,NAS, Consistent Carbohydrate  . NON FORMULARY CPAP while sleeping. May use CPAP from home at previous home settings. Every Shift Day, Evening, Night  . NON FORMULARY Clean CPAP mask and tubing with baby shampoo and rinse well with warm water weekly and PRN Special Instructions: Clean weekly and PRN Once A Day on Tue 03:15 PM - 11:15 PM  . ondansetron (ZOFRAN) 4 MG tablet Take 1 tablet (4 mg total) by mouth every 6 (six) hours as needed for nausea.  . pantoprazole (PROTONIX) 40 MG tablet Take 1 tablet (40 mg total) by mouth daily.  . Skin Protectants, Misc. (NO-STING SKIN-PREP EX) Apply skin prep to right heel qshift for prevention. Every Shift  . Vitamins A & D (VITAMIN A & D) ointment Apply A&D ointment to right foot and ankle qshift & prn for prevention. Every Shift  . Wound Dressings (ALLEVYN HEEL) PADS Apply allevyn foam dressing to left heel and change q3days & prn until wound left lateral heel is resolved. Once A Day Every 3 Days 07:15 AM - 03:15 PM   No facility-administered encounter medications on  file as of 07/03/2019.      SIGNIFICANT DIAGNOSTIC EXAMS   PREVIOUS;  06-19-19: chest x-[place]: Minimal right basilar subsegmental atelectasis   06-19-19: ct of head: Mild diffuse cortical atrophy. No acute intracranial abnormality seen.  06-19-19: ct of lumbar spine:  1. No acute/traumatic lumbar spine pathology. 2. Osteopenia with extensive multilevel degenerative changes of the spine. 3. Lumbar levoscoliosis and multilevel disc bulge and neural foraminal narrowing. MRI may provide better evaluation. Aortic Atherosclerosis   06-22-19: lumbar spine x-[place]: 1. Degenerative changes. 2. No evidence for acute abnormality  NO NEW EXAMS.   LABS REVIEWED PREVIOUS;   06-19-19: wbc 8.4; hgb 14.2; hct 44.4; mcv 93.7 plt 317; glucose 106; bun 26; creat 0.82; k+ 4.1; na++ 138; ca 8.8; liver normal albumin 3.4; urine culture no growth 06-20-19-: wbc 7.7; hgb 13.1 hct 42.2; mcv 94.8; plt 306; glucose 108; bun 24; creat 0.80; k+ 3.9; na++ 138; ca 8.8 ;liver normal albumin 3.3   NO NEW LABS.   Review of Systems  Constitutional: Negative for malaise/fatigue.  Respiratory: Negative for cough and shortness of breath.   Cardiovascular: Negative for chest pain, palpitations and leg swelling.  Gastrointestinal: Negative for abdominal pain, constipation and heartburn.  Musculoskeletal: Negative for back pain, joint pain and myalgias.  Skin: Negative.   Neurological: Negative for dizziness.  Psychiatric/Behavioral: The patient is not nervous/anxious.      Physical Exam Constitutional:      General: He is not in acute distress.    Appearance: He is well-developed. He is not diaphoretic.  Eyes:     Comments: History of bilateral cataract with lens implants      Neck:     Musculoskeletal: Neck supple.     Thyroid: No thyromegaly.  Cardiovascular:     Rate and Rhythm: Normal rate and regular rhythm.     Pulses: Normal pulses.     Heart sounds: Normal heart sounds.  Pulmonary:     Effort: Pulmonary  effort is normal. No respiratory distress.     Breath sounds: Normal breath sounds.  Abdominal:     General: Bowel sounds are normal. There is no distension.     Palpations: Abdomen is soft.     Tenderness: There is no abdominal tenderness.  Musculoskeletal: Normal range of motion.     Right lower leg: No edema.     Left lower leg: No edema.  Lymphadenopathy:     Cervical: No cervical adenopathy.  Skin:    General: Skin is warm and dry.  Neurological:     Mental Status: He is alert. Mental status is at baseline.  Psychiatric:        Mood and Affect: Mood normal.     ASSESSMENT/ PLAN:  TODAY  1. Vascular dementia without behavioral disturbance 2. 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 Physical deconditioning  Will continue therapy as directed Will continue current medications Will continue to monitor his status.      MD is aware of resident's narcotic use and is in agreement with current plan of care. We will attempt to wean resident as appropriate.  Ok Edwards NP [facility]  Contact [phone] Monday through Friday 8am- 5pm  After hours call [phone]

## 2019-07-06 ENCOUNTER — Encounter: Payer: Self-pay | Admitting: Internal Medicine

## 2019-07-06 DIAGNOSIS — F0390 Unspecified dementia without behavioral disturbance: Secondary | ICD-10-CM | POA: Insufficient documentation

## 2019-07-09 ENCOUNTER — Non-Acute Institutional Stay (SKILLED_NURSING_FACILITY): Payer: Medicare Other | Admitting: Adult Health

## 2019-07-09 ENCOUNTER — Encounter: Payer: Self-pay | Admitting: Adult Health

## 2019-07-09 DIAGNOSIS — F0150 Vascular dementia without behavioral disturbance: Secondary | ICD-10-CM

## 2019-07-09 DIAGNOSIS — M5136 Other intervertebral disc degeneration, lumbar region: Secondary | ICD-10-CM | POA: Diagnosis not present

## 2019-07-09 DIAGNOSIS — E785 Hyperlipidemia, unspecified: Secondary | ICD-10-CM

## 2019-07-09 DIAGNOSIS — M51369 Other intervertebral disc degeneration, lumbar region without mention of lumbar back pain or lower extremity pain: Secondary | ICD-10-CM

## 2019-07-09 NOTE — Progress Notes (Signed)
Location:    [facility] Room Number: 132/P Place of Service:  SNF (31)   CODE STATUS: DNR  Allergies  Allergen Reactions  . Penicillins Rash  . Sulfonamide Derivatives Rash  . Tetanus Toxoid Rash    Chief Complaint  Patient presents with  . Medical Management of Chronic Issues       Vascular dementia without behavioral disturbance:   Degenerative disc disease lumbar:   Dyslipidemia:   Weekly follow up for the first 30 days post hospitalization.     HPI:  He is a 85 year old short term rehab patient being seen for the management of her chronic illnesses: dementia; ddd; dyslipidemia. He denies any uncontrolled pain; states he has a good appetite; denies any constipation. He continues to be dependent on staff for his adls.   Past Medical History:  Diagnosis Date  . Cancer (HCC)    melanoma-left side  . Chronic pain    legs and feet  . Coronary artery disease   . Dementia ([place])   . Diabetes mellitus without complication ([place])   . Foot drop   . GERD (gastroesophageal reflux disease)   . Gout   . Hyperlipidemia   . Hypertension   . OCD (obsessive compulsive disorder)   . PONV (postoperative nausea and vomiting)   . Psychosis ()   . Reflux   . Sarcoidosis   . Sleep apnea    cpap    Past Surgical History:  Procedure Laterality Date  . BACK SURGERY     cervical neck  . CATARACT EXTRACTION W/PHACO  01/05/2012   Procedure: CATARACT EXTRACTION PHACO AND INTRAOCULAR LENS PLACEMENT (IOC);  Surgeon: [name] Branch, MD;  Location: [facility];  Service: Ophthalmology;  Laterality: Left;  CDE 18.47  . CATARACT EXTRACTION W/PHACO  02/02/2012   Procedure: CATARACT EXTRACTION PHACO AND INTRAOCULAR LENS PLACEMENT (IOC);  Surgeon: [name] Branch, MD;  Location: [facility];  Service: Ophthalmology;  Laterality: Right;  CDE:15.38  . CHOLECYSTECTOMY    . GALLBLADDER SURGERY    . hemorhoidectomy    . MELANOMA EXCISION     left side-[name]  . TONSILLECTOMY      Social  History   Socioeconomic History  . Marital status: Married    Spouse name: Not on file  . Number of children: Not on file  . Years of education: Not on file  . Highest education level: Not on file  Occupational History  . Not on file  Social Needs  . Financial resource strain: Not on file  . Food insecurity    Worry: Not on file    Inability: Not on file  . Transportation needs    Medical: Not on file    Non-medical: Not on file  Tobacco Use  . Smoking status: Former Smoker    Packs/day: 0.25    Years: 20.00    Pack years: 5.00    Types: Pipe    Quit date: 01/02/1974    Years since quitting: 45.5  . Smokeless tobacco: Never Used  Substance and Sexual Activity  . Alcohol use: No  . Drug use: No  . Sexual activity: Yes  Lifestyle  . Physical activity    Days per week: Not on file    Minutes per session: Not on file  . Stress: Not on file  Relationships  . Social Herbalist on phone: Not on file    Gets together: Not on file    Attends religious service:  Not on file    Active member of club or organization: Not on file    Attends meetings of clubs or organizations: Not on file    Relationship status: Not on file  . Intimate partner violence    Fear of current or ex partner: Not on file    Emotionally abused: Not on file    Physically abused: Not on file    Forced sexual activity: Not on file  Other Topics Concern  . Not on file  Social History Narrative   Former pipe smoker, quit many years ago.   Lived at home with wife prior to SNF admission.     Family History  Problem Relation Age of Onset  . Heart disease Other   . Arthritis Other   . Cancer Other   . Anesthesia problems Neg Hx   . Hypotension Neg Hx   . Malignant hyperthermia Neg Hx   . Pseudochol deficiency Neg Hx       VITAL SIGNS BP 130/74   Pulse 71   Temp (!) 97 F (36.1 C) (Oral)   Resp 20   Ht 5\' 9"  (1.753 m)   Wt 198 lb 12.8 oz (90.2 kg)   BMI 29.36 kg/m   Outpatient  Encounter Medications as of 07/09/2019  Medication Sig  . acetaminophen (TYLENOL) 500 MG tablet Take 500 mg by mouth every 6 (six) hours as needed. For pain   . allopurinol (ZYLOPRIM) 300 MG tablet Take 300 mg by mouth daily.    [name] (NORVASC) 5 MG tablet Take 1 tablet (5 mg total) by mouth daily.  [name] EC 81 MG tablet Take 81 mg by mouth daily.  [name] (LIPITOR) 20 MG tablet Take 1 tablet (20 mg total) by mouth daily.  Roseanne Kaufman [place]-Castor Oil (VENELEX) OINT Apply to sacrum, coccyx, and bilateral buttocks each shift and prn   . donepezil (ARICEPT) 10 MG tablet Take 10 mg by mouth at bedtime.   [name] (ZESTRIL) 5 MG tablet Take 1 tablet (5 mg total) by mouth daily.  . meloxicam (MOBIC) 7.5 MG tablet Take 7.5 mg by mouth daily.    . Multiple Vitamin (MULITIVITAMIN WITH MINERALS) TABS Take 1 tablet by mouth daily.  . NON FORMULARY Diet: Regular,NAS, Consistent Carbohydrate  . NON FORMULARY CPAP while sleeping. May use CPAP from home at previous home settings. Every Shift Day, Evening, Night  . NON FORMULARY Clean CPAP mask and tubing with baby shampoo and rinse well with warm water weekly and PRN Special Instructions: Clean weekly and PRN Once A Day on Tue 03:15 PM - 11:15 PM  . ondansetron (ZOFRAN) 4 MG tablet Take 1 tablet (4 mg total) by mouth every 6 (six) hours as needed for nausea.  . pantoprazole (PROTONIX) 40 MG tablet Take 1 tablet (40 mg total) by mouth daily.  . Skin Protectants, Misc. (NO-STING SKIN-PREP EX) Apply skin prep to right heel qshift for prevention. Every Shift  . Vitamins A & D (VITAMIN A & D) ointment Apply A&D ointment to right foot and ankle qshift & prn for prevention. Every Shift  . Wound Dressings (ALLEVYN HEEL) PADS Apply allevyn foam dressing to left heel and change q3days & prn until wound left lateral heel is resolved. Once A Day Every 3 Days 07:15 AM - 03:15 PM   No facility-administered encounter medications on file as of  07/09/2019.      SIGNIFICANT DIAGNOSTIC EXAMS   PREVIOUS;   06-19-19: chest x-[place]: Minimal  right basilar subsegmental atelectasis   06-19-19: ct of head: Mild diffuse cortical atrophy. No acute intracranial abnormality seen.  06-19-19: ct of lumbar spine:  1. No acute/traumatic lumbar spine pathology. 2. Osteopenia with extensive multilevel degenerative changes of the spine. 3. Lumbar levoscoliosis and multilevel disc bulge and neural foraminal narrowing. MRI may provide better evaluation. Aortic Atherosclerosis   06-22-19: lumbar spine x-[place]: 1. Degenerative changes. 2. No evidence for acute abnormality  NO NEW EXAMS.   LABS REVIEWED PREVIOUS;   06-19-19: wbc 8.4; hgb 14.2; hct 44.4; mcv 93.7 plt 317; glucose 106; bun 26; creat 0.82; k+ 4.1; na++ 138; ca 8.8; liver normal albumin 3.4; urine culture no growth 06-20-19-: wbc 7.7; hgb 13.1 hct 42.2; mcv 94.8; plt 306; glucose 108; bun 24; creat 0.80; k+ 3.9; na++ 138; ca 8.8 ;liver normal albumin 3.3   NO NEW LABS.    Review of Systems  Constitutional: Negative for malaise/fatigue.  Respiratory: Negative for cough and shortness of breath.   Cardiovascular: Negative for chest pain, palpitations and leg swelling.  Gastrointestinal: Negative for abdominal pain, constipation and heartburn.  Musculoskeletal: Negative for back pain, joint pain and myalgias.  Skin: Negative.   Neurological: Negative for dizziness.  Psychiatric/Behavioral: The patient is not nervous/anxious.     Physical Exam Constitutional:      General: He is not in acute distress.    Appearance: He is well-developed. He is not diaphoretic.  Eyes:     Comments: History of bilateral cataract with lens implants       Neck:     Musculoskeletal: Neck supple.     Thyroid: No thyromegaly.  Cardiovascular:     Rate and Rhythm: Normal rate and regular rhythm.     Pulses: Normal pulses.     Heart sounds: Normal heart sounds.  Pulmonary:     Effort: Pulmonary effort is  normal. No respiratory distress.     Breath sounds: Normal breath sounds.  Abdominal:     General: Bowel sounds are normal. There is no distension.     Palpations: Abdomen is soft.     Tenderness: There is no abdominal tenderness.  Musculoskeletal: Normal range of motion.     Right lower leg: No edema.     Left lower leg: No edema.  Lymphadenopathy:     Cervical: No cervical adenopathy.  Skin:    General: Skin is warm and dry.  Neurological:     Mental Status: He is alert. Mental status is at baseline.  Psychiatric:        Mood and Affect: Mood normal.     ASSESSMENT/ PLAN:  TODAY  1. Vascular dementia without behavioral disturbance: without change weight is 198 pounds will continue aricept 10 mg nightly   2. Degenerative disc disease lumbar: is stable pain is managed will continue mobic 7.5 mg daily is off vicodin   3. Dyslipidemia: is stable will continue lipitor 20 mg daily    PREVIOUS   4.  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 Physical deconditioning/ multiple falls: is presently stable; will continue therapy as directed to improve upon his ability to self perform his adls.   6. Essential hypertension: is stable b/p 130/74 will continue lisinopril 5 mg daily norvasc 5 mg daily and asa 81 mg daily   7. Sleep apnea, obstructive: is stable uses CPAP nightly   8. GERD without esophagitis: is stable will continue protonix 40 mg daily  MD is aware of resident's narcotic use and is in agreement with current plan of care. We will attempt to wean resident as appropriate.  Roylene Heaton NP [facility]  Contact [phone] Monday through Friday 8am- 5pm  After hours call [phone]   

## 2019-07-15 ENCOUNTER — Encounter: Payer: Self-pay | Admitting: Adult Health

## 2019-07-15 ENCOUNTER — Non-Acute Institutional Stay (SKILLED_NURSING_FACILITY): Payer: Medicare Other | Admitting: Adult Health

## 2019-07-15 DIAGNOSIS — I10 Essential (primary) hypertension: Secondary | ICD-10-CM

## 2019-07-15 DIAGNOSIS — M1A09X0 Idiopathic chronic gout, multiple sites, without tophus (tophi): Secondary | ICD-10-CM

## 2019-07-15 DIAGNOSIS — R296 Repeated falls: Secondary | ICD-10-CM | POA: Diagnosis not present

## 2019-07-15 DIAGNOSIS — R5381 Other malaise: Secondary | ICD-10-CM

## 2019-07-15 NOTE — Progress Notes (Signed)
Location:    [place] [facility] Room Number: 132/P Place of Service:  SNF (31)   CODE STATUS: DNR  Allergies  Allergen Reactions  . Penicillins Rash  . Sulfonamide Derivatives Rash  . Tetanus Toxoid Rash    Chief Complaint  Patient presents with  . 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     HPI:  He is a 85 year old short term resident of this facility being seen for the management of his chronic illnesses: gout; hypertension; deconditioning. He denies any joint or back pain. He denies any changes in his appetite; no fatigue. He continues to participate in therapy.   Past Medical History:  Diagnosis Date  . Cancer (HCC)    melanoma-left side  . Chronic pain    legs and feet  . Coronary artery disease   . Dementia ([place])   . Diabetes mellitus without complication ([place])   . Foot drop   . GERD (gastroesophageal reflux disease)   . Gout   . Hyperlipidemia   . Hypertension   . OCD (obsessive compulsive disorder)   . PONV (postoperative nausea and vomiting)   . Psychosis ([place])   . Reflux   . Sarcoidosis   . Sleep apnea    cpap    Past Surgical History:  Procedure Laterality Date  . BACK SURGERY     cervical neck  . CATARACT EXTRACTION W/PHACO  01/05/2012   Procedure: CATARACT EXTRACTION PHACO AND INTRAOCULAR LENS PLACEMENT (IOC);  Surgeon: [name] Branch, MD;  Location: [facility];  Service: Ophthalmology;  Laterality: Left;  CDE 18.47  . CATARACT EXTRACTION W/PHACO  02/02/2012   Procedure: CATARACT EXTRACTION PHACO AND INTRAOCULAR LENS PLACEMENT (IOC);  Surgeon: [name] Branch, MD;  Location: [facility];  Service: Ophthalmology;  Laterality: Right;  CDE:15.38  . CHOLECYSTECTOMY    . GALLBLADDER SURGERY    . hemorhoidectomy    . MELANOMA EXCISION     left side-[name]  .  TONSILLECTOMY      Social History   Socioeconomic History  . Marital status: Married    Spouse name: Not on file  . Number of children: Not on file  . Years of education: Not on file  . Highest education level: Not on file  Occupational History  . Not on file  Social Needs  . Financial resource strain: Not on file  . Food insecurity    Worry: Not on file    Inability: Not on file  . Transportation needs    Medical: Not on file    Non-medical: Not on file  Tobacco Use  . Smoking status: Former Smoker    Packs/day: 0.25    Years: 20.00    Pack years: 5.00    Types: Pipe    Quit date: 01/02/1974    Years since quitting: 45.5  . Smokeless tobacco: Never Used  Substance and Sexual Activity  . Alcohol use: No  . Drug use: No  . Sexual activity: Yes  Lifestyle  . Physical activity    Days per week: Not on file    Minutes per session: Not on file  . Stress: Not on file  Relationships  . Social Herbalist on phone: Not on file    Gets together: Not on file  Attends religious service: Not on file    Active member of club or organization: Not on file    Attends meetings of clubs or organizations: Not on file    Relationship status: Not on file  . Intimate partner violence    Fear of current or ex partner: Not on file    Emotionally abused: Not on file    Physically abused: Not on file    Forced sexual activity: Not on file  Other Topics Concern  . Not on file  Social History Narrative   Former pipe smoker, quit many years ago.   Lived at home with wife prior to SNF admission.     Family History  Problem Relation Age of Onset  . Heart disease Other   . Arthritis Other   . Cancer Other   . Anesthesia problems Neg Hx   . Hypotension Neg Hx   . Malignant hyperthermia Neg Hx   . Pseudochol deficiency Neg Hx       VITAL SIGNS BP 111/70   Pulse (!) 48   Temp 97.8 F (36.6 C) (Oral)   Resp 20   Ht 5\' 9"  (1.753 m)   Wt 198 lb 12.8 oz (90.2 kg)    BMI 29.36 kg/m   Outpatient Encounter Medications as of 07/15/2019  Medication Sig  . acetaminophen (TYLENOL) 500 MG tablet Take 500 mg by mouth every 6 (six) hours as needed. For pain   . allopurinol (ZYLOPRIM) 300 MG tablet Take 300 mg by mouth daily.    [name] (NORVASC) 5 MG tablet Take 1 tablet (5 mg total) by mouth daily.  [name] EC 81 MG tablet Take 81 mg by mouth daily.  [name] (LIPITOR) 20 MG tablet Take 1 tablet (20 mg total) by mouth daily.  Roseanne Kaufman [place]-Castor Oil (VENELEX) OINT Apply to sacrum, coccyx, and bilateral buttocks each shift and prn   . donepezil (ARICEPT) 10 MG tablet Take 10 mg by mouth at bedtime.   [name] (ZESTRIL) 5 MG tablet Take 1 tablet (5 mg total) by mouth daily.  . meloxicam (MOBIC) 7.5 MG tablet Take 7.5 mg by mouth daily.    . Multiple Vitamin (MULITIVITAMIN WITH MINERALS) TABS Take 1 tablet by mouth daily.  . NON FORMULARY Diet: Regular,NAS, Consistent Carbohydrate  . NON FORMULARY CPAP while sleeping. May use CPAP from home at previous home settings. Every Shift Day, Evening, Night  . NON FORMULARY Clean CPAP mask and tubing with baby shampoo and rinse well with warm water weekly and PRN Special Instructions: Clean weekly and PRN Once A Day on Tue 03:15 PM - 11:15 PM  . ondansetron (ZOFRAN) 4 MG tablet Take 1 tablet (4 mg total) by mouth every 6 (six) hours as needed for nausea.  . pantoprazole (PROTONIX) 40 MG tablet Take 1 tablet (40 mg total) by mouth daily.  . Skin Protectants, Misc. (NO-STING SKIN-PREP EX) Apply skin prep to right heel qshift for prevention. Every Shift  . Vitamins A & D (VITAMIN A & D) ointment Apply A&D ointment to right foot and ankle qshift & prn for prevention. Every Shift  . Wound Dressings (ALLEVYN HEEL) PADS Apply allevyn foam dressing to left heel and change q3days & prn until wound left lateral heel is resolved. Once A Day Every 3 Days 07:15 AM - 03:15 PM   No facility-administered encounter  medications on file as of 07/15/2019.      SIGNIFICANT DIAGNOSTIC EXAMS   PREVIOUS;   06-19-19:  chest x-[place]: Minimal right basilar subsegmental atelectasis   06-19-19: ct of head: Mild diffuse cortical atrophy. No acute intracranial abnormality seen.  06-19-19: ct of lumbar spine:  1. No acute/traumatic lumbar spine pathology. 2. Osteopenia with extensive multilevel degenerative changes of the spine. 3. Lumbar levoscoliosis and multilevel disc bulge and neural foraminal narrowing. MRI may provide better evaluation. Aortic Atherosclerosis   06-22-19: lumbar spine x-[place]: 1. Degenerative changes. 2. No evidence for acute abnormality  NO NEW EXAMS.   LABS REVIEWED PREVIOUS;   06-19-19: wbc 8.4; hgb 14.2; hct 44.4; mcv 93.7 plt 317; glucose 106; bun 26; creat 0.82; k+ 4.1; na++ 138; ca 8.8; liver normal albumin 3.4; urine culture no growth 06-20-19-: wbc 7.7; hgb 13.1 hct 42.2; mcv 94.8; plt 306; glucose 108; bun 24; creat 0.80; k+ 3.9; na++ 138; ca 8.8 ;liver normal albumin 3.3   NO NEW LABS.   Review of Systems  Constitutional: Negative for malaise/fatigue.  Respiratory: Negative for cough and shortness of breath.   Cardiovascular: Negative for chest pain, palpitations and leg swelling.  Gastrointestinal: Negative for abdominal pain, constipation and heartburn.  Musculoskeletal: Negative for back pain, joint pain and myalgias.  Skin: Negative.   Neurological: Negative for dizziness.  Psychiatric/Behavioral: The patient is not nervous/anxious.      Physical Exam Constitutional:      General: He is not in acute distress.    Appearance: He is well-developed. He is not diaphoretic.  Eyes:     Comments: History of bilateral cataract with lens implants        Neck:     Musculoskeletal: Neck supple.     Thyroid: No thyromegaly.  Cardiovascular:     Rate and Rhythm: Normal rate and regular rhythm.     Pulses: Normal pulses.     Heart sounds: Normal heart sounds.  Pulmonary:      Effort: Pulmonary effort is normal. No respiratory distress.     Breath sounds: Normal breath sounds.  Abdominal:     General: Bowel sounds are normal. There is no distension.     Palpations: Abdomen is soft.     Tenderness: There is no abdominal tenderness.  Musculoskeletal: Normal range of motion.     Right lower leg: No edema.     Left lower leg: No edema.  Lymphadenopathy:     Cervical: No cervical adenopathy.  Skin:    General: Skin is warm and dry.  Neurological:     Mental Status: He is alert. Mental status is at baseline.  Psychiatric:        Mood and Affect: Mood normal.      ASSESSMENT/ PLAN:  TODAY  1. 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 Physical deconditioning and multiple fall; is stable will continue therapy as directed will monitor his status.   3. Essential hypertension: stable b/p 111/70 will continue lisinopril 5 mg daily and norvasc 5 mg daily   PREVIOUS   4. Sleep apnea, obstructive: is stable uses CPAP nightly   5. GERD without esophagitis: is stable will continue protonix 40 mg daily   6. Vascular dementia without behavioral disturbance: without change weight is 198 pounds will continue aricept 10 mg nightly   7. Degenerative disc disease lumbar: is stable pain is managed will continue mobic 7.5 mg daily is off vicodin   8. Dyslipidemia: is stable will continue lipitor 20 mg daily     MD  is aware of resident's narcotic use and is in agreement with current plan of care. We will attempt to wean resident as appropriate.  Ok Edwards NP [facility]  Contact [phone] Monday through Friday 8am- 5pm  After hours call [phone]

## 2019-07-25 ENCOUNTER — Non-Acute Institutional Stay (SKILLED_NURSING_FACILITY): Payer: Medicare Other | Admitting: Adult Health

## 2019-07-25 ENCOUNTER — Encounter: Payer: Self-pay | Admitting: Adult Health

## 2019-07-25 DIAGNOSIS — F0150 Vascular dementia without behavioral disturbance: Secondary | ICD-10-CM | POA: Diagnosis not present

## 2019-07-25 DIAGNOSIS — G4733 Obstructive sleep apnea (adult) (pediatric): Secondary | ICD-10-CM | POA: Diagnosis not present

## 2019-07-25 DIAGNOSIS — K219 Gastro-esophageal reflux disease without esophagitis: Secondary | ICD-10-CM

## 2019-07-25 NOTE — Progress Notes (Signed)
Location:    [facility] Room Number: 132/P Place of Service:  SNF (31)   CODE STATUS: DNR  Allergies  Allergen Reactions  . Penicillins Rash  . Sulfonamide Derivatives Rash  . Tetanus Toxoid Rash   Chief Complaint  Patient presents with  . Medical Management of Chronic Issues         Sleep apnea; obstructive: GERD without esophagitis: Vascular dementia without behavioral disturbance:     HPI:  Eric Bowman is a 85 year old short term rehab patient being seen for the management of his chronic illnesses; sleep apnea; gerd; dementia. Eric Bowman denies any heart burn or constipation. Eric Bowman denies any uncontrolled pain; no fatigue. Is sleeping well at night.   Past Medical History:  Diagnosis Date  . Cancer (HCC)    melanoma-left side  . Chronic pain    legs and feet  . Coronary artery disease   . Dementia ([place])   . Diabetes mellitus without complication ([place])   . Foot drop   . GERD (gastroesophageal reflux disease)   . Gout   . Hyperlipidemia   . Hypertension   . OCD (obsessive compulsive disorder)   . PONV (postoperative nausea and vomiting)   . Psychosis ([place])   . Reflux   . Sarcoidosis   . Sleep apnea    cpap    Past Surgical History:  Procedure Laterality Date  . BACK SURGERY     cervical neck  . CATARACT EXTRACTION W/PHACO  01/05/2012   Procedure: CATARACT EXTRACTION PHACO AND INTRAOCULAR LENS PLACEMENT (IOC);  Surgeon: [name] Branch, MD;  Location: [facility];  Service: Ophthalmology;  Laterality: Left;  CDE 18.47  . CATARACT EXTRACTION W/PHACO  02/02/2012   Procedure: CATARACT EXTRACTION PHACO AND INTRAOCULAR LENS PLACEMENT (IOC);  Surgeon: [name] Branch, MD;  Location: [facility];  Service: Ophthalmology;  Laterality: Right;  CDE:15.38  . CHOLECYSTECTOMY    . GALLBLADDER SURGERY    . hemorhoidectomy    . MELANOMA EXCISION     left side-[name]  . TONSILLECTOMY      Social History   Socioeconomic History  . Marital status: Married    Spouse name: Not on file   . Number of children: Not on file  . Years of education: Not on file  . Highest education level: Not on file  Occupational History  . Not on file  Social Needs  . Financial resource strain: Not on file  . Food insecurity    Worry: Not on file    Inability: Not on file  . Transportation needs    Medical: Not on file    Non-medical: Not on file  Tobacco Use  . Smoking status: Former Smoker    Packs/day: 0.25    Years: 20.00    Pack years: 5.00    Types: Pipe    Quit date: 01/02/1974    Years since quitting: 45.5  . Smokeless tobacco: Never Used  Substance and Sexual Activity  . Alcohol use: No  . Drug use: No  . Sexual activity: Yes  Lifestyle  . Physical activity    Days per week: Not on file    Minutes per session: Not on file  . Stress: Not on file  Relationships  . Social Herbalist on phone: Not on file    Gets together: Not on file    Attends religious service: Not on file    Active member of club or organization: Not on file  Attends meetings of clubs or organizations: Not on file    Relationship status: Not on file  . Intimate partner violence    Fear of current or ex partner: Not on file    Emotionally abused: Not on file    Physically abused: Not on file    Forced sexual activity: Not on file  Other Topics Concern  . Not on file  Social History Narrative   Former pipe smoker, quit many years ago.   Lived at home with wife prior to SNF admission.     Family History  Problem Relation Age of Onset  . Heart disease Other   . Arthritis Other   . Cancer Other   . Anesthesia problems Neg Hx   . Hypotension Neg Hx   . Malignant hyperthermia Neg Hx   . Pseudochol deficiency Neg Hx       VITAL SIGNS BP (!) 148/75   Pulse 70   Temp 98.8 F (37.1 C) (Oral)   Resp 20   Ht 5\' 9"  (1.753 m)   Wt 198 lb 3.2 oz (89.9 kg)   BMI 29.27 kg/m   Outpatient Encounter Medications as of 07/25/2019  Medication Sig  . acetaminophen (TYLENOL) 500 MG  tablet Take 500 mg by mouth every 6 (six) hours as needed. For pain   . allopurinol (ZYLOPRIM) 300 MG tablet Take 300 mg by mouth daily.    [name] (NORVASC) 5 MG tablet Take 1 tablet (5 mg total) by mouth daily.  [name] EC 81 MG tablet Take 81 mg by mouth daily.  [name] (LIPITOR) 20 MG tablet Take 1 tablet (20 mg total) by mouth daily.  Roseanne Kaufman Peru-Castor Oil (VENELEX) OINT Apply to sacrum, coccyx, and bilateral buttocks each shift and prn   . donepezil (ARICEPT) 10 MG tablet Take 10 mg by mouth at bedtime.   [name] (ZESTRIL) 5 MG tablet Take 1 tablet (5 mg total) by mouth daily.  . meloxicam (MOBIC) 7.5 MG tablet Take 7.5 mg by mouth daily.    . Multiple Vitamin (MULITIVITAMIN WITH MINERALS) TABS Take 1 tablet by mouth daily.  . NON FORMULARY Diet: Regular,NAS, Consistent Carbohydrate  . NON FORMULARY CPAP while sleeping. May use CPAP from home at previous home settings. Every Shift Day, Evening, Night  . NON FORMULARY Clean CPAP mask and tubing with baby shampoo and rinse well with warm water weekly and PRN Special Instructions: Clean weekly and PRN Once A Day on Tue 03:15 PM - 11:15 PM  . ondansetron (ZOFRAN) 4 MG tablet Take 1 tablet (4 mg total) by mouth every 6 (six) hours as needed for nausea.  . pantoprazole (PROTONIX) 40 MG tablet Take 1 tablet (40 mg total) by mouth daily.  . Vitamins A & D (VITAMIN A & D) ointment Apply A&D ointment to right foot and ankle qshift & prn for prevention. Every Shift  . Wound Dressings (ALLEVYN HEEL) PADS Apply allevyn foam dressing to left heel and change q3days & prn until wound left lateral heel is resolved. Once A Day Every 3 Days 07:15 AM - 03:15 PM  . [DISCONTINUED] Skin Protectants, Misc. (NO-STING SKIN-PREP EX) Apply skin prep to right heel qshift for prevention. Every Shift   No facility-administered encounter medications on file as of 07/25/2019.      SIGNIFICANT DIAGNOSTIC EXAMS   PREVIOUS;   06-19-19:  chest x-[place]: Minimal right basilar subsegmental atelectasis   06-19-19: ct of head: Mild diffuse cortical atrophy. No acute intracranial  abnormality seen.  06-19-19: ct of lumbar spine:  1. No acute/traumatic lumbar spine pathology. 2. Osteopenia with extensive multilevel degenerative changes of the spine. 3. Lumbar levoscoliosis and multilevel disc bulge and neural foraminal narrowing. MRI may provide better evaluation. Aortic Atherosclerosis   06-22-19: lumbar spine x-Constantinos: 1. Degenerative changes. 2. No evidence for acute abnormality  NO NEW EXAMS.   LABS REVIEWED PREVIOUS;   06-19-19: wbc 8.4; hgb 14.2; hct 44.4; mcv 93.7 plt 317; glucose 106; bun 26; creat 0.82; k+ 4.1; na++ 138; ca 8.8; liver normal albumin 3.4; urine culture no growth 06-20-19-: wbc 7.7; hgb 13.1 hct 42.2; mcv 94.8; plt 306; glucose 108; bun 24; creat 0.80; k+ 3.9; na++ 138; ca 8.8 ;liver normal albumin 3.3   NO NEW LABS.   Review of Systems  Constitutional: Negative for malaise/fatigue.  Respiratory: Negative for cough and shortness of breath.   Cardiovascular: Negative for chest pain, palpitations and leg swelling.  Gastrointestinal: Negative for abdominal pain, constipation and heartburn.  Musculoskeletal: Negative for back pain, joint pain and myalgias.  Skin: Negative.   Neurological: Negative for dizziness.  Psychiatric/Behavioral: The patient is not nervous/anxious.     Physical Exam Constitutional:      General: Eric Bowman is not in acute distress.    Appearance: Eric Bowman is well-developed. Eric Bowman is not diaphoretic.  Eyes:     Comments: History of bilateral cataract with lens implants         Neck:     Musculoskeletal: Neck supple.     Thyroid: No thyromegaly.  Cardiovascular:     Rate and Rhythm: Normal rate and regular rhythm.     Pulses: Normal pulses.     Heart sounds: Normal heart sounds.  Pulmonary:     Effort: Pulmonary effort is normal. No respiratory distress.     Breath sounds: Normal breath sounds.   Abdominal:     General: Bowel sounds are normal. There is no distension.     Palpations: Abdomen is soft.     Tenderness: There is no abdominal tenderness.  Musculoskeletal: Normal range of motion.     Right lower leg: No edema.     Left lower leg: No edema.  Lymphadenopathy:     Cervical: No cervical adenopathy.  Skin:    General: Skin is warm and dry.  Neurological:     Mental Status: Eric Bowman is alert. Mental status is at baseline.  Psychiatric:        Mood and Affect: Mood normal.      ASSESSMENT/ PLAN:  TODAY  1. Sleep apnea; obstructive: is stable uses CPAP nightly   2. GERD without esophagitis: is stable will continue protonix 40 mg daily   3. Vascular dementia without behavioral disturbance: is without change weight is 198 pounds will continue aricept 10 mg nightly   PREVIOUS   4. Degenerative disc disease lumbar: is stable pain is managed will continue mobic 7.5 mg daily is off vicodin   5. Dyslipidemia: is stable will continue lipitor 20 mg daily   6. 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 Physical deconditioning and multiple fall; is stable will continue therapy as directed will monitor his status.   8. Essential hypertension: stable b/p 148/75 will continue lisinopril 5 mg daily and norvasc 5 mg daily       MD is aware of resident's narcotic use and is in agreement with current plan of care. We will  attempt to wean resident as appropriate.  Ok Edwards NP [facility]  Contact [phone] Monday through Friday 8am- 5pm  After hours call [phone]

## 2019-08-20 DIAGNOSIS — M25361 Other instability, right knee: Secondary | ICD-10-CM | POA: Diagnosis not present

## 2019-08-21 ENCOUNTER — Other Ambulatory Visit: Payer: Self-pay | Admitting: Adult Health

## 2019-08-23 ENCOUNTER — Encounter (HOSPITAL_COMMUNITY)
Admission: RE | Admit: 2019-08-23 | Discharge: 2019-08-23 | Disposition: A | Discharge status: Home / Self Care | Payer: Medicare Other | Source: Ambulatory Visit | Attending: Internal Medicine | Admitting: Internal Medicine

## 2019-08-23 DIAGNOSIS — I739 Peripheral vascular disease, unspecified: Secondary | ICD-10-CM | POA: Diagnosis not present

## 2019-08-23 DIAGNOSIS — E119 Type 2 diabetes mellitus without complications: Secondary | ICD-10-CM | POA: Insufficient documentation

## 2019-08-23 DIAGNOSIS — B351 Tinea unguium: Secondary | ICD-10-CM | POA: Diagnosis not present

## 2019-08-23 DIAGNOSIS — L603 Nail dystrophy: Secondary | ICD-10-CM | POA: Diagnosis not present

## 2019-08-23 LAB — HEMOGLOBIN A1C
Hgb A1c MFr Bld: 5.6 % (ref 4.8–5.6)
Mean Plasma Glucose: 114.02 mg/dL

## 2019-08-28 ENCOUNTER — Non-Acute Institutional Stay (SKILLED_NURSING_FACILITY): Payer: Medicare Other | Admitting: Internal Medicine

## 2019-08-28 ENCOUNTER — Encounter: Payer: Self-pay | Admitting: Internal Medicine

## 2019-08-28 DIAGNOSIS — I10 Essential (primary) hypertension: Secondary | ICD-10-CM | POA: Diagnosis not present

## 2019-08-28 DIAGNOSIS — K219 Gastro-esophageal reflux disease without esophagitis: Secondary | ICD-10-CM

## 2019-08-28 DIAGNOSIS — I2510 Atherosclerotic heart disease of native coronary artery without angina pectoris: Secondary | ICD-10-CM | POA: Diagnosis not present

## 2019-08-28 NOTE — Progress Notes (Signed)
Location:  [facility] Room Number: 132/P Place of Service:  SNF (31)  Sinda Du, MD  Patient Care Team: Sinda Du, MD as PCP - General (Internal Medicine)  Extended Emergency Contact Information Primary Emergency Contact: [facility] Address: [address]          [place], [place] [address] [name] of [place] Phone: [phone] Work Phone: [phone] Relation: Spouse    Allergies: Penicillins, Sulfonamide derivatives, and Tetanus toxoid  Chief Complaint  Patient presents with  . Medical Management of Chronic Issues    Routine visit of medical mangement    HPI: Patient is 85 y.o. male who is being seen for routine issues of coronary artery disease, hypertension, and GERD.  Past Medical History:  Diagnosis Date  . Cancer (HCC)    melanoma-left side  . Chronic pain    legs and feet  . Coronary artery disease   . Dementia ([place])   . Diabetes mellitus without complication ([place])   . Foot drop   . GERD (gastroesophageal reflux disease)   . Gout   . Hyperlipidemia   . Hypertension   . OCD (obsessive compulsive disorder)   . PONV (postoperative nausea and vomiting)   . Psychosis ([place])   . Reflux   . Sarcoidosis   . Sleep apnea    cpap    Past Surgical History:  Procedure Laterality Date  . BACK SURGERY     cervical neck  . CATARACT EXTRACTION W/PHACO  01/05/2012   Procedure: CATARACT EXTRACTION PHACO AND INTRAOCULAR LENS PLACEMENT (IOC);  Surgeon: [name] Branch, MD;  Location: [facility];  Service: Ophthalmology;  Laterality: Left;  CDE 18.47  . CATARACT EXTRACTION W/PHACO  02/02/2012   Procedure: CATARACT EXTRACTION PHACO AND INTRAOCULAR LENS PLACEMENT (IOC);  Surgeon: [name] Branch, MD;  Location: [facility];  Service: Ophthalmology;  Laterality: Right;  CDE:15.38  . CHOLECYSTECTOMY    . GALLBLADDER SURGERY    . hemorhoidectomy    . MELANOMA EXCISION     left side-[name]  . TONSILLECTOMY      Outpatient Encounter  Medications as of 08/28/2019  Medication Sig  . acetaminophen (TYLENOL) 500 MG tablet Take 500 mg by mouth every 6 (six) hours as needed. For pain   . allopurinol (ZYLOPRIM) 300 MG tablet Take 300 mg by mouth daily.    [name] (NORVASC) 5 MG tablet Take 1 tablet (5 mg total) by mouth daily.  [name] EC 81 MG tablet Take 81 mg by mouth daily.  [name] (LIPITOR) 20 MG tablet Take 1 tablet (20 mg total) by mouth daily.  Roseanne Kaufman [place]-Castor Oil (VENELEX) OINT Apply to sacrum, coccyx, and bilateral buttocks each shift and prn   . donepezil (ARICEPT) 10 MG tablet Take 10 mg by mouth at bedtime.   [name] (ZESTRIL) 5 MG tablet Take 1 tablet (5 mg total) by mouth daily.  . meloxicam (MOBIC) 7.5 MG tablet Take 7.5 mg by mouth daily.    . Multiple Vitamin (MULITIVITAMIN WITH MINERALS) TABS Take 1 tablet by mouth daily.  . NON FORMULARY Diet: Regular,NAS, Consistent Carbohydrate  . NON FORMULARY CPAP while sleeping. May use CPAP from home at previous home settings. Every Shift Day, Evening, Night  . NON FORMULARY Clean CPAP mask and tubing with baby shampoo and rinse well with warm water weekly and PRN Special Instructions: Clean weekly and PRN Once A Day on Tue 03:15 PM - 11:15 PM  . ondansetron (ZOFRAN) 4 MG tablet Take 1  tablet (4 mg total) by mouth every 6 (six) hours as needed for nausea.  . pantoprazole (PROTONIX) 40 MG tablet Take 1 tablet (40 mg total) by mouth daily.  . Vitamins A & D (VITAMIN A & D) ointment Apply A&D ointment to right foot and ankle qshift & prn for prevention. Every Shift  . [DISCONTINUED] Wound Dressings (ALLEVYN HEEL) PADS Apply allevyn foam dressing to left heel and change q3days & prn until wound left lateral heel is resolved. Once A Day Every 3 Days 07:15 AM - 03:15 PM   No facility-administered encounter medications on file as of 08/28/2019.     No orders of the defined types were placed in this encounter.   Immunization History   Administered Date(s) Administered  . Influenza Whole 08/15/2007  . Influenza-Unspecified 07/11/2019  . Pneumococcal Conjugate-13 08/05/2019    Social History   Tobacco Use  . Smoking status: Former Smoker    Packs/day: 0.25    Years: 20.00    Pack years: 5.00    Types: Pipe    Quit date: 01/02/1974    Years since quitting: 45.6  . Smokeless tobacco: Never Used  Substance Use Topics  . Alcohol use: No    Review of Systems  GENERAL:  no fevers, fatigue, appetite changes SKIN: No itching, rash HEENT: No complaint RESPIRATORY: No cough, wheezing, SOB CARDIAC: No chest pain, palpitations, lower extremity edema  GI: No abdominal pain, No N/V/D or constipation, No heartburn or reflux  GU: No dysuria, frequency or urgency, or incontinence  MUSCULOSKELETAL: No unrelieved bone/joint pain NEUROLOGIC: No headache, dizziness  PSYCHIATRIC: No overt anxiety or sadness  Vitals:   08/28/19 0943  BP: 135/77  Pulse: 83  Resp: 20  Temp: 97.9 F (36.6 C)  SpO2: 97%   Body mass index is 29.03 kg/m. Physical Exam  GENERAL APPEARANCE: Alert, moderately conversant, No acute distress  SKIN: No diaphoresis rash HEENT: Unremarkable RESPIRATORY: Breathing is even, unlabored. Lung sounds are clear   CARDIOVASCULAR: Heart RRR no murmurs, rubs or gallops. No peripheral edema  GASTROINTESTINAL: Abdomen is soft, non-tender, not distended w/ normal bowel sounds.  GENITOURINARY: Bladder non tender, not distended  MUSCULOSKELETAL: No abnormal joints or musculature NEUROLOGIC: Cranial nerves 2-12 grossly intact. Moves all extremities PSYCHIATRIC: Mood and affect with dementia, no behavioral issues  Patient Active Problem List   Diagnosis Date Noted  . Dementia without behavioral disturbance ([place]) 07/06/2019  . Dyslipidemia 06/24/2019  . Vascular dementia without behavioral disturbance ([place]) 06/24/2019  . Chronic gout without tophus 06/24/2019  . Physical deconditioning 06/24/2019  .  Multiple falls 06/20/2019  . Arthritis of knee, left 01/03/2013  . Degenerative disc disease, lumbar 01/03/2013  . Coronary artery disease 09/13/2011  . Obstructive sleep apnea on CPAP 09/10/2007  . 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 Essential hypertension 05/02/2007  . GERD 05/02/2007    CMP     Component Value Date/Time   NA 136 06/21/2019 0556   K 3.6 06/21/2019 0556   CL 105 06/21/2019 0556   CO2 24 06/21/2019 0556   GLUCOSE 94 06/21/2019 0556   BUN 19 06/21/2019 0556   CREATININE 0.65 06/21/2019 0556   CALCIUM 8.5 (L) 06/21/2019 0556   PROT 6.1 (L) 06/20/2019 0634   ALBUMIN 3.3 (L) 06/20/2019 0634   AST 14 (L) 06/20/2019 0634   ALT 15 06/20/2019 0634   ALKPHOS 74 06/20/2019 0634   BILITOT 0.7 06/20/2019 0634   GFRNONAA >60 06/21/2019 0556   GFRAA >60 06/21/2019 [id number]  Recent Labs    06/19/19 1504 06/20/19 0634 06/21/19 0556  NA 138 138 136  K 4.1 3.9 3.6  CL 105 106 105  CO2 25 25 24   GLUCOSE 106* 108* 94  BUN 26* 24* 19  CREATININE 0.82 0.80 0.65  CALCIUM 8.8* 8.8* 8.5*   Recent Labs    06/19/19 1504 06/20/19 0634  AST 17 14*  ALT 17 15  ALKPHOS 79 74  BILITOT 0.4 0.7  PROT 6.6 6.1*  ALBUMIN 3.4* 3.3*   Recent Labs    06/19/19 1504 06/20/19 0634  WBC 8.4 7.7  NEUTROABS 5.5  --   HGB 14.2 13.1  HCT 44.4 42.2  MCV 93.7 94.8  PLT 317 306   No results for input(s): CHOL, LDLCALC, TRIG in the last 8760 hours.  Invalid input(s): HCL No results found for: MICROALBUR No results found for: TSH Lab Results  Component Value Date   HGBA1C 5.6 08/23/2019   Lab Results  Component Value Date   CHOL 113 09/02/2008   HDL 40 09/02/2008   LDLCALC 52 09/02/2008   TRIG 104 09/02/2008   CHOLHDL 2.8 Ratio 09/02/2008    Significant Diagnostic Results in last 30 days:  No results found.  Assessment and Plan  Coronary artery disease No complaints of chest pain; continue ASA 81 mg daily, patient is on statin  Essential hypertension Controlled;  continue Zestril 5 mg daily and Norvasc 5 mg daily  GERD No reports of reflux or aspiration; continue Protonix 40 mg daily    Hennie Duos, MD

## 2019-09-01 ENCOUNTER — Encounter: Payer: Self-pay | Admitting: Internal Medicine

## 2019-09-01 NOTE — Assessment & Plan Note (Signed)
Controlled; continue Zestril 5 mg daily and Norvasc 5 mg daily

## 2019-09-01 NOTE — Assessment & Plan Note (Signed)
No reports of reflux or aspiration; continue Protonix 40 mg daily 

## 2019-09-01 NOTE — Assessment & Plan Note (Signed)
No complaints of chest pain; continue ASA 81 mg daily, patient is on statin

## 2019-09-05 ENCOUNTER — Encounter: Payer: Self-pay | Admitting: Adult Health

## 2019-09-05 ENCOUNTER — Non-Acute Institutional Stay (SKILLED_NURSING_FACILITY): Payer: Medicare Other | Admitting: Adult Health

## 2019-09-05 DIAGNOSIS — M5412 Radiculopathy, cervical region: Secondary | ICD-10-CM

## 2019-09-05 NOTE — Progress Notes (Signed)
Location:    [facility] Room Number: 132/P Place of Service:  SNF (31)   CODE STATUS: DNR  Allergies  Allergen Reactions  . Penicillins Rash  . Sulfonamide Derivatives Rash  . Tetanus Toxoid Rash    Chief Complaint  Patient presents with  . Acute Visit    Therapy Concerns    HPI:  He is having worsening numbness in both hands with the right worse than left. He is getting contractures in his right fingers. He has decreased range of motion in both shoulders. He has chronic poor range of motion in neck. He does have pain and numbness in both upper extremities. This has been slowly getting worse over the past couple of weeks.   Past Medical History:  Diagnosis Date  . Cancer (HCC)    melanoma-left side  . Chronic pain    legs and feet  . Coronary artery disease   . Dementia ([place])   . Diabetes mellitus without complication ([place])   . Foot drop   . GERD (gastroesophageal reflux disease)   . Gout   . Hyperlipidemia   . Hypertension   . OCD (obsessive compulsive disorder)   . PONV (postoperative nausea and vomiting)   . Psychosis ([place])   . Reflux   . Sarcoidosis   . Sleep apnea    cpap    Past Surgical History:  Procedure Laterality Date  . BACK SURGERY     cervical neck  . CATARACT EXTRACTION W/PHACO  01/05/2012   Procedure: CATARACT EXTRACTION PHACO AND INTRAOCULAR LENS PLACEMENT (IOC);  Surgeon: [name] Branch, MD;  Location: [facility];  Service: Ophthalmology;  Laterality: Left;  CDE 18.47  . CATARACT EXTRACTION W/PHACO  02/02/2012   Procedure: CATARACT EXTRACTION PHACO AND INTRAOCULAR LENS PLACEMENT (IOC);  Surgeon: [name] Branch, MD;  Location: [facility];  Service: Ophthalmology;  Laterality: Right;  CDE:15.38  . CHOLECYSTECTOMY    . GALLBLADDER SURGERY    . hemorhoidectomy    . MELANOMA EXCISION     left side-[name]  . TONSILLECTOMY      Social History   Socioeconomic History  . Marital status: Married    Spouse name: Not on file  . Number  of children: Not on file  . Years of education: Not on file  . Highest education level: Not on file  Occupational History  . Not on file  Social Needs  . Financial resource strain: Not on file  . Food insecurity    Worry: Not on file    Inability: Not on file  . Transportation needs    Medical: Not on file    Non-medical: Not on file  Tobacco Use  . Smoking status: Former Smoker    Packs/day: 0.25    Years: 20.00    Pack years: 5.00    Types: Pipe    Quit date: 01/02/1974    Years since quitting: 45.7  . Smokeless tobacco: Never Used  Substance and Sexual Activity  . Alcohol use: No  . Drug use: No  . Sexual activity: Yes  Lifestyle  . Physical activity    Days per week: Not on file    Minutes per session: Not on file  . Stress: Not on file  Relationships  . Social Herbalist on phone: Not on file    Gets together: Not on file    Attends religious service: Not on file    Active member of club or organization: Not on file  Attends meetings of clubs or organizations: Not on file    Relationship status: Not on file  . Intimate partner violence    Fear of current or ex partner: Not on file    Emotionally abused: Not on file    Physically abused: Not on file    Forced sexual activity: Not on file  Other Topics Concern  . Not on file  Social History Narrative   Former pipe smoker, quit many years ago.   Lived at home with wife prior to SNF admission.     Family History  Problem Relation Age of Onset  . Heart disease Other   . Arthritis Other   . Cancer Other   . Anesthesia problems Neg Hx   . Hypotension Neg Hx   . Malignant hyperthermia Neg Hx   . Pseudochol deficiency Neg Hx       VITAL SIGNS BP (!) 152/84   Pulse 71   Temp (!) 97.1 F (36.2 C) (Oral)   Resp 20   Ht 5\' 9"  (1.753 m)   Wt 194 lb 9.6 oz (88.3 kg)   SpO2 97%   BMI 28.74 kg/m   Outpatient Encounter Medications as of 09/05/2019  Medication Sig  . acetaminophen (TYLENOL) 500  MG tablet Take 500 mg by mouth every 6 (six) hours as needed. For pain   . allopurinol (ZYLOPRIM) 300 MG tablet Take 300 mg by mouth daily.    [name] (NORVASC) 5 MG tablet Take 1 tablet (5 mg total) by mouth daily.  [name] EC 81 MG tablet Take 81 mg by mouth daily.  [name] (LIPITOR) 20 MG tablet Take 1 tablet (20 mg total) by mouth daily.  Roseanne Kaufman Peru-Castor Oil (VENELEX) OINT Apply to sacrum, coccyx, and bilateral buttocks each shift and prn   . donepezil (ARICEPT) 10 MG tablet Take 10 mg by mouth at bedtime.   [name] (ZESTRIL) 5 MG tablet Take 1 tablet (5 mg total) by mouth daily.  . meloxicam (MOBIC) 7.5 MG tablet Take 7.5 mg by mouth daily.    . Multiple Vitamin (MULITIVITAMIN WITH MINERALS) TABS Take 1 tablet by mouth daily.  . NON FORMULARY Diet: Regular,NAS, Consistent Carbohydrate  . NON FORMULARY CPAP while sleeping. May use CPAP from home at previous home settings. Every Shift Day, Evening, Night  . NON FORMULARY Clean CPAP mask and tubing with baby shampoo and rinse well with warm water weekly and PRN Special Instructions: Clean weekly and PRN Once A Day on Tue 03:15 PM - 11:15 PM  . ondansetron (ZOFRAN) 4 MG tablet Take 1 tablet (4 mg total) by mouth every 6 (six) hours as needed for nausea.  . pantoprazole (PROTONIX) 40 MG tablet Take 1 tablet (40 mg total) by mouth daily.  . Vitamins A & D (VITAMIN A & D) ointment Apply A&D ointment to right foot and ankle qshift & prn for prevention. Every Shift   No facility-administered encounter medications on file as of 09/05/2019.      SIGNIFICANT DIAGNOSTIC EXAMS   PREVIOUS;   06-19-19: chest x-Issiah: Minimal right basilar subsegmental atelectasis   06-19-19: ct of head: Mild diffuse cortical atrophy. No acute intracranial abnormality seen.  06-19-19: ct of lumbar spine:  1. No acute/traumatic lumbar spine pathology. 2. Osteopenia with extensive multilevel degenerative changes of the spine. 3. Lumbar  levoscoliosis and multilevel disc bulge and neural foraminal narrowing. MRI may provide better evaluation. Aortic Atherosclerosis   06-22-19: lumbar spine x-Klye: 1. Degenerative changes.  2. No evidence for acute abnormality  NO NEW EXAMS.   LABS REVIEWED PREVIOUS;   06-19-19: wbc 8.4; hgb 14.2; hct 44.4; mcv 93.7 plt 317; glucose 106; bun 26; creat 0.82; k+ 4.1; na++ 138; ca 8.8; liver normal albumin 3.4; urine culture no growth 06-20-19-: wbc 7.7; hgb 13.1 hct 42.2; mcv 94.8; plt 306; glucose 108; bun 24; creat 0.80; k+ 3.9; na++ 138; ca 8.8 ;liver normal albumin 3.3   TODAY;   08-23-19: hgb a1c 5.6    Review of Systems  Constitutional: Negative for malaise/fatigue.  Respiratory: Negative for cough and shortness of breath.   Cardiovascular: Negative for chest pain, palpitations and leg swelling.  Gastrointestinal: Negative for abdominal pain, constipation and heartburn.  Musculoskeletal: Positive for joint pain. Negative for back pain and myalgias.       Shoulders and hand   Skin: Negative.   Neurological: Negative for dizziness.       Has bilateral hand numbness right >left   Psychiatric/Behavioral: The patient is not nervous/anxious.      Physical Exam Constitutional:      General: He is not in acute distress.    Appearance: He is well-developed. He is not diaphoretic.  Eyes:     Comments: History of bilateral cataract with lens implants          Neck:     Musculoskeletal: Neck supple.     Thyroid: No thyromegaly.  Cardiovascular:     Rate and Rhythm: Normal rate and regular rhythm.     Pulses: Normal pulses.     Heart sounds: Normal heart sounds.  Pulmonary:     Effort: Pulmonary effort is normal. No respiratory distress.     Breath sounds: Normal breath sounds.  Abdominal:     General: Bowel sounds are normal. There is no distension.     Palpations: Abdomen is soft.     Tenderness: There is no abdominal tenderness.  Musculoskeletal:     Right lower leg: No edema.      Left lower leg: No edema.     Comments: History of cervical spine surgery Is able to move all extremities Has chronic limited ROM to neck Has right fingers contractures Decreased ROM to bilateral shoulders Increased difficulty with grip   Lymphadenopathy:     Cervical: No cervical adenopathy.  Skin:    General: Skin is warm and dry.  Neurological:     Mental Status: He is alert. Mental status is at baseline.  Psychiatric:        Mood and Affect: Mood normal.      ASSESSMENT/ PLAN:  TODAY;   1. Cervical spine radiculopathy: has numbness of bilateral hands  Therapy to evaluate and treat for range of motion in shoulders and hands Therapy will splint right hand for contractures Will increased mobic to 15 mg daily  Will begin gabapentin 100 mg nightly and will monitor    MD is aware of resident's narcotic use and is in agreement with current plan of care. We will attempt to wean resident as appropriate.  Ok Edwards NP [facility]  Contact [phone] Monday through Friday 8am- 5pm  After hours call [phone]

## 2019-09-09 DIAGNOSIS — M5412 Radiculopathy, cervical region: Secondary | ICD-10-CM | POA: Insufficient documentation

## 2019-09-18 ENCOUNTER — Encounter: Payer: Self-pay | Admitting: Adult Health

## 2019-09-18 ENCOUNTER — Non-Acute Institutional Stay (SKILLED_NURSING_FACILITY): Payer: Medicare Other | Admitting: Adult Health

## 2019-09-18 DIAGNOSIS — M5136 Other intervertebral disc degeneration, lumbar region: Secondary | ICD-10-CM | POA: Diagnosis not present

## 2019-09-18 DIAGNOSIS — F0150 Vascular dementia without behavioral disturbance: Secondary | ICD-10-CM | POA: Diagnosis not present

## 2019-09-18 DIAGNOSIS — Z0000 Encounter for general adult medical examination without abnormal findings: Secondary | ICD-10-CM

## 2019-09-18 DIAGNOSIS — R296 Repeated falls: Secondary | ICD-10-CM

## 2019-09-18 NOTE — Progress Notes (Signed)
Location:  [facility] Room Number: 132-P Place of Service:  SNF [phone]) Provider: Ok Edwards NP   Patient Care Team: Hennie Duos, MD as PCP - General (Internal Medicine) [name] [name], NP as Nurse Practitioner ([place]) [facility], [place] ([place])  Extended Emergency Contact Information Primary Emergency Contact: [facility] Address: [address]          [place], [place] [address] [name] of [place] Phone: [phone] Work Phone: [phone] Relation: Spouse  Code Status: dnr Goals of Care: Advanced Directive information Advanced Directives 09/18/2019  Does Patient Have a Medical Advance Directive? Yes  Type of Advance Directive Out of facility DNR (pink MOST or yellow form)  Does patient want to make changes to medical advance directive? No - Patient declined  Copy of [place] in Chart? -  Would patient like information on creating a medical advance directive? -  Pre-existing out of facility DNR order (yellow form or pink MOST form) -     Chief Complaint  Patient presents with  . Medicare Wellness    Annual Wellness Visit    HPI: Patient is a 85 y.o. male seen in today for an annual wellness exam.  He is a long term resident of SNF. He has had numerous falls over the past year; for which he was hospitalized in August. His weight is stable. He continues to have numbness in his hands. He has a history of neck surgery. He denies any anxiety or depressive thoughts. He continues to be followed for his chronic illnesses including: falls; dementia; ddd  Depression screen [facility] 2/9 09/18/2019  Decreased Interest 0  Down, Depressed, Hopeless 0  PHQ - 2 Score 0    Fall Risk  09/18/2019  Falls in the past year? 1  Number falls in past yr: 1  Injury with Fall? 1  Risk for fall due to : History of fall(s);Impaired balance/gait;Impaired mobility    Health Maintenance  Topic  Date Due  . PNA vac Low Risk Adult (2 of 2 - PPSV23) 08/04/2020  . INFLUENZA VACCINE  Completed  . TETANUS/TDAP  Discontinued     Past Medical History:  Diagnosis Date  . Cancer (HCC)    melanoma-left side  . Chronic pain    legs and feet  . Coronary artery disease   . Dementia ([place])   . Diabetes mellitus without complication ([place])   . Foot drop   . GERD (gastroesophageal reflux disease)   . Gout   . Hyperlipidemia   . Hypertension   . OCD (obsessive compulsive disorder)   . PONV (postoperative nausea and vomiting)   . Psychosis ([place])   . Reflux   . Sarcoidosis   . Sleep apnea    cpap    Past Surgical History:  Procedure Laterality Date  . BACK SURGERY     cervical neck  . CATARACT EXTRACTION W/PHACO  01/05/2012   Procedure: CATARACT EXTRACTION PHACO AND INTRAOCULAR LENS PLACEMENT (IOC);  Surgeon: [name] Branch, MD;  Location: [facility];  Service: Ophthalmology;  Laterality: Left;  CDE 18.47  . CATARACT EXTRACTION W/PHACO  02/02/2012   Procedure: CATARACT EXTRACTION PHACO AND INTRAOCULAR LENS PLACEMENT (IOC);  Surgeon: [name] Branch, MD;  Location: [facility];  Service: Ophthalmology;  Laterality: Right;  CDE:15.38  . CHOLECYSTECTOMY    . GALLBLADDER SURGERY    . hemorhoidectomy    . MELANOMA EXCISION     left side-[name]  . TONSILLECTOMY  Family History  Problem Relation Age of Onset  . Heart disease Other   . Arthritis Other   . Cancer Other   . Anesthesia problems Neg Hx   . Hypotension Neg Hx   . Malignant hyperthermia Neg Hx   . Pseudochol deficiency Neg Hx     Social History   Socioeconomic History  . Marital status: Married    Spouse name: Not on file  . Number of children: Not on file  . Years of education: Not on file  . Highest education level: Not on file  Occupational History  . Occupation: retired   Scientific laboratory technician  . Financial resource strain: Not hard at all  . Food insecurity    Worry: Never true    Inability: Never true  . Transportation  needs    Medical: No    Non-medical: No  Tobacco Use  . Smoking status: Former Smoker    Packs/day: 0.25    Years: 20.00    Pack years: 5.00    Types: Pipe    Quit date: 01/02/1974    Years since quitting: 45.7  . Smokeless tobacco: Never Used  Substance and Sexual Activity  . Alcohol use: No  . Drug use: No  . Sexual activity: Not Currently  Lifestyle  . Physical activity    Days per week: 0 days    Minutes per session: 0 min  . Stress: Not at all  Relationships  . Social Herbalist on phone: Never    Gets together: Never    Attends religious service: Never    Active member of club or organization: No    Attends meetings of clubs or organizations: Not on file    Relationship status: Married  Other Topics Concern  . Not on file  Social History Narrative   Former pipe smoker, quit many years ago.   [facility] term resident of [facility]       reports that he quit smoking about 45 years ago. His smoking use included pipe. He has a 5.00 pack-year smoking history. He has never used smokeless tobacco. He reports that he does not drink alcohol or use drugs.   Allergies  Allergen Reactions  . Penicillins Rash  . Sulfonamide Derivatives Rash  . Tetanus Toxoid Rash    Outpatient Encounter Medications as of 09/18/2019  Medication Sig  . acetaminophen (TYLENOL) 500 MG tablet Take 500 mg by mouth every 6 (six) hours as needed. For pain   . allopurinol (ZYLOPRIM) 300 MG tablet Take 300 mg by mouth daily.    [name] (NORVASC) 5 MG tablet Take 1 tablet (5 mg total) by mouth daily.  [name] EC 81 MG tablet Take 81 mg by mouth daily.  [name] (LIPITOR) 20 MG tablet Take 1 tablet (20 mg total) by mouth daily.  Roseanne Kaufman [place]-Castor Oil (VENELEX) OINT Apply to sacrum, coccyx, and bilateral buttocks each shift and prn   . donepezil (ARICEPT) 10 MG tablet Take 10 mg by mouth at bedtime.   [name] (ZESTRIL) 5 MG tablet Take 1 tablet (5 mg total) by mouth daily.  .  meloxicam (MOBIC) 15 MG tablet Take 15 mg by mouth daily.   . Multiple Vitamin (MULITIVITAMIN WITH MINERALS) TABS Take 1 tablet by mouth daily.  . NON FORMULARY Diet: Regular,NAS, Consistent Carbohydrate  . NON FORMULARY CPAP while sleeping. May use CPAP from home at previous home settings. Every Shift Day, Evening, Night  . NON FORMULARY Clean CPAP  mask and tubing with baby shampoo and rinse well with warm water weekly and PRN Special Instructions: Clean weekly and PRN Once A Day on Tue 03:15 PM - 11:15 PM  . ondansetron (ZOFRAN) 4 MG tablet Take 1 tablet (4 mg total) by mouth every 6 (six) hours as needed for nausea.  . pantoprazole (PROTONIX) 40 MG tablet Take 1 tablet (40 mg total) by mouth daily.  . Vitamins A & D (VITAMIN A & D) ointment Apply A&D ointment to right foot and ankle qshift & prn for prevention. Every Shift  . [DISCONTINUED] meloxicam (MOBIC) 7.5 MG tablet Take 7.5 mg by mouth daily.     No facility-administered encounter medications on file as of 09/18/2019.      Review of Systems:  Review of Systems  Constitutional: Negative for appetite change and fatigue.  HENT: Negative for congestion.   Respiratory: Negative for cough, chest tightness and shortness of breath.   Cardiovascular: Negative for chest pain, palpitations and leg swelling.  Gastrointestinal: Negative for abdominal pain, constipation, diarrhea and nausea.  Musculoskeletal: Negative for arthralgias and myalgias.  Skin: Negative for pallor.  Neurological: Negative for dizziness.       Has bilateral hand numbness right >left    Psychiatric/Behavioral: The patient is not nervous/anxious.     Physical Exam: Vitals:   09/18/19 1252  BP: (!) 144/64  Pulse: 83  Resp: 20  Temp: (!) 97.3 F (36.3 C)  TempSrc: Oral  SpO2: 97%  Weight: 194 lb 9.6 oz (88.3 kg)  Height: 5\' 6"  (1.676 m)   Body mass index is 31.41 kg/m. Physical Exam Constitutional:      General: He is not in acute distress.     Appearance: He is well-developed. He is not diaphoretic.  Eyes:     Comments: History of bilateral cataract with lens implants           Neck:     Thyroid: No thyromegaly.  Cardiovascular:     Rate and Rhythm: Normal rate and regular rhythm.     Heart sounds: Normal heart sounds.  Pulmonary:     Effort: Pulmonary effort is normal. No respiratory distress.     Breath sounds: Normal breath sounds.  Abdominal:     General: Bowel sounds are normal. There is no distension.     Palpations: Abdomen is soft.     Tenderness: There is no abdominal tenderness.  Musculoskeletal:     Right lower leg: No edema.     Left lower leg: No edema.     Comments:  History of cervical spine surgery Is able to move all extremities Has chronic limited ROM to neck Has right fingers contractures Decreased ROM to bilateral shoulders Increased difficulty with grip    Lymphadenopathy:     Cervical: No cervical adenopathy.  Skin:    General: Skin is warm and dry.  Neurological:     Mental Status: He is alert. Mental status is at baseline.  Psychiatric:        Mood and Affect: Mood normal.       PREVIOUS;   06-19-19: chest x-[place]: Minimal right basilar subsegmental atelectasis   06-19-19: ct of head: Mild diffuse cortical atrophy. No acute intracranial abnormality seen.  06-19-19: ct of lumbar spine:  1. No acute/traumatic lumbar spine pathology. 2. Osteopenia with extensive multilevel degenerative changes of the spine. 3. Lumbar levoscoliosis and multilevel disc bulge and neural foraminal narrowing. MRI may provide better evaluation. Aortic Atherosclerosis   06-22-19: lumbar spine  x-Rhon[place]: 1. Degenerative changes. 2. No evidence for acute abnormality  NO NEW EXAMS.   LABS REVIEWED PREVIOUS;   06-19-19: wbc 8.4; hgb 14.2; hct 44.4; mcv 93.7 plt 317; glucose 106; bun 26; creat 0.82; k+ 4.1; na++ 138; ca 8.8; liver normal albumin 3.4; urine culture no growth 06-20-19-: wbc 7.7; hgb 13.1 hct 42.2; mcv  94.8; plt 306; glucose 108; bun 24; creat 0.80; k+ 3.9; na++ 138; ca 8.8 ;liver normal albumin 3.3  08-23-19: hgb a1c 5.6   NO NEW LABS.       Assessment/Plan 1. Encounter for Medicare annual wellness exam 2. 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 Degenerative disc disease lumbar 4. multiple falls  His health maintenance is up to date Will continue current medications Will continue current plan of care Will continue to monitor his status.     Ok Edwards NP [facility]  Contact [phone] Monday through Friday 8am- 5pm  After hours call [phone]

## 2019-09-24 ENCOUNTER — Non-Acute Institutional Stay (SKILLED_NURSING_FACILITY): Payer: Medicare Other | Admitting: Adult Health

## 2019-09-24 ENCOUNTER — Encounter: Payer: Self-pay | Admitting: Adult Health

## 2019-09-24 DIAGNOSIS — M5136 Other intervertebral disc degeneration, lumbar region: Secondary | ICD-10-CM | POA: Diagnosis not present

## 2019-09-24 DIAGNOSIS — E785 Hyperlipidemia, unspecified: Secondary | ICD-10-CM

## 2019-09-24 DIAGNOSIS — M1A09X0 Idiopathic chronic gout, multiple sites, without tophus (tophi): Secondary | ICD-10-CM

## 2019-09-24 NOTE — Progress Notes (Signed)
Location:    [facility] Room Number: 132/P Place of Service:  SNF (31)   CODE STATUS: DNR  Allergies  Allergen Reactions  . Penicillins Rash  . Sulfonamide Derivatives Rash  . Tetanus Toxoid Rash    Chief Complaint  Patient presents with  . Medical Management of Chronic Issues        Degenerative disease disease lumbar: . 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 He continues to have numbness in both hands. He has weakness present. He is awaiting a neurological consult. He denies any changes in his appetite. No insomnia.   Past Medical History:  Diagnosis Date  . Cancer (HCC)    melanoma-left side  . Chronic pain    legs and feet  . Coronary artery disease   . Dementia ([place])   . Diabetes mellitus without complication ([place])   . Foot drop   . GERD (gastroesophageal reflux disease)   . Gout   . Hyperlipidemia   . Hypertension   . OCD (obsessive compulsive disorder)   . PONV (postoperative nausea and vomiting)   . Psychosis ([place])   . Reflux   . Sarcoidosis   . Sleep apnea    cpap    Past Surgical History:  Procedure Laterality Date  . BACK SURGERY     cervical neck  . CATARACT EXTRACTION W/PHACO  01/05/2012   Procedure: CATARACT EXTRACTION PHACO AND INTRAOCULAR LENS PLACEMENT (IOC);  Surgeon: [name] Branch, MD;  Location: [facility];  Service: Ophthalmology;  Laterality: Left;  CDE 18.47  . CATARACT EXTRACTION W/PHACO  02/02/2012   Procedure: CATARACT EXTRACTION PHACO AND INTRAOCULAR LENS PLACEMENT (IOC);  Surgeon: [name] Branch, MD;  Location: [facility];  Service: Ophthalmology;  Laterality: Right;  CDE:15.38  . CHOLECYSTECTOMY    . GALLBLADDER SURGERY    . hemorhoidectomy    . MELANOMA EXCISION     left side-[name]  . TONSILLECTOMY      Social History   Socioeconomic  History  . Marital status: Married    Spouse name: Not on file  . Number of children: Not on file  . Years of education: Not on file  . Highest education level: Not on file  Occupational History  . Occupation: retired   Scientific laboratory technician  . Financial resource strain: Not hard at all  . Food insecurity    Worry: Never true    Inability: Never true  . Transportation needs    Medical: No    Non-medical: No  Tobacco Use  . Smoking status: Former Smoker    Packs/day: 0.25    Years: 20.00    Pack years: 5.00    Types: Pipe    Quit date: 01/02/1974    Years since quitting: 45.7  . Smokeless tobacco: Never Used  Substance and Sexual Activity  . Alcohol use: No  . Drug use: No  . Sexual activity: Not Currently  Lifestyle  . Physical activity    Days per week: 0 days    Minutes per session: 0 min  . Stress: Not at all  Relationships  . Social Herbalist on phone: Never    Gets together: Never    Attends religious service: Never    Active member of club or organization: No    Attends  meetings of clubs or organizations: Not on file    Relationship status: Married  . Intimate partner violence    Fear of current or ex partner: No    Emotionally abused: No    Physically abused: No    Forced sexual activity: No  Other Topics Concern  . Not on file  Social History Narrative   Former pipe smoker, quit many years ago.   Long term resident of [facility]      Family History  Problem Relation Age of Onset  . Heart disease Other   . Arthritis Other   . Cancer Other   . Anesthesia problems Neg Hx   . Hypotension Neg Hx   . Malignant hyperthermia Neg Hx   . Pseudochol deficiency Neg Hx       VITAL SIGNS BP (!) 143/79   Pulse 81   Temp 98.8 F (37.1 C) (Oral)   Resp 20   Ht 5\' 9"  (1.753 m)   Wt 194 lb 9.6 oz (88.3 kg)   SpO2 97%   BMI 28.74 kg/m   Outpatient Encounter Medications as of 09/24/2019  Medication Sig  . acetaminophen (TYLENOL) 500 MG tablet Take 500 mg by  mouth every 6 (six) hours as needed. For pain   . allopurinol (ZYLOPRIM) 300 MG tablet Take 300 mg by mouth daily.    [name] (NORVASC) 5 MG tablet Take 1 tablet (5 mg total) by mouth daily.  [name] EC 81 MG tablet Take 81 mg by mouth daily.  [name] (LIPITOR) 20 MG tablet Take 1 tablet (20 mg total) by mouth daily.  Roseanne Kaufman Peru-Castor Oil (VENELEX) OINT Apply to sacrum, coccyx, and bilateral buttocks each shift and prn   . donepezil (ARICEPT) 10 MG tablet Take 10 mg by mouth at bedtime.   . gabapentin (NEURONTIN) 100 MG capsule Take 100 mg by mouth at bedtime.  [name] (ZESTRIL) 5 MG tablet Take 1 tablet (5 mg total) by mouth daily.  . meloxicam (MOBIC) 15 MG tablet Take 15 mg by mouth daily.   . Multiple Vitamin (MULITIVITAMIN WITH MINERALS) TABS Take 1 tablet by mouth daily.  . NON FORMULARY Diet: Regular,NAS, Consistent Carbohydrate  . NON FORMULARY CPAP while sleeping. May use CPAP from home at previous home settings. Every Shift Day, Evening, Night  . NON FORMULARY Clean CPAP mask and tubing with baby shampoo and rinse well with warm water weekly and PRN Special Instructions: Clean weekly and PRN Once A Day on Tue 03:15 PM - 11:15 PM  . ondansetron (ZOFRAN) 4 MG tablet Take 1 tablet (4 mg total) by mouth every 6 (six) hours as needed for nausea.  . pantoprazole (PROTONIX) 40 MG tablet Take 1 tablet (40 mg total) by mouth daily.  . Vitamins A & D (VITAMIN A & D) ointment Apply A&D ointment to right foot and ankle qshift & prn for prevention. Every Shift   No facility-administered encounter medications on file as of 09/24/2019.      SIGNIFICANT DIAGNOSTIC EXAMS   PREVIOUS;   06-19-19: chest x-Chue: Minimal right basilar subsegmental atelectasis   06-19-19: ct of head: Mild diffuse cortical atrophy. No acute intracranial abnormality seen.  06-19-19: ct of lumbar spine:  1. No acute/traumatic lumbar spine pathology. 2. Osteopenia with extensive multilevel  degenerative changes of the spine. 3. Lumbar levoscoliosis and multilevel disc bulge and neural foraminal narrowing. MRI may provide better evaluation. Aortic Atherosclerosis   06-22-19: lumbar spine x-Agastya: 1. Degenerative changes. 2. No  evidence for acute abnormality  NO NEW EXAMS.   LABS REVIEWED PREVIOUS;   06-19-19: wbc 8.4; hgb 14.2; hct 44.4; mcv 93.7 plt 317; glucose 106; bun 26; creat 0.82; k+ 4.1; na++ 138; ca 8.8; liver normal albumin 3.4; urine culture no growth 06-20-19-: wbc 7.7; hgb 13.1 hct 42.2; mcv 94.8; plt 306; glucose 108; bun 24; creat 0.80; k+ 3.9; na++ 138; ca 8.8 ;liver normal albumin 3.3  08-23-19: hgb a1c 5.6   NO NEW LABS.    Review of Systems  Constitutional: Negative for malaise/fatigue.  Respiratory: Negative for cough and shortness of breath.   Cardiovascular: Negative for chest pain, palpitations and leg swelling.  Gastrointestinal: Negative for abdominal pain, constipation and heartburn.  Musculoskeletal: Negative for back pain, joint pain and myalgias.  Skin: Negative.   Neurological: Positive for weakness. Negative for dizziness.       Has bilateral had numbness and ltmited range of motion   Psychiatric/Behavioral: The patient is not nervous/anxious.     Physical Exam Constitutional:      General: He is not in acute distress.    Appearance: He is well-developed. He is not diaphoretic.  Eyes:     Comments:  History of bilateral cataract with lens implants            Neck:     Musculoskeletal: Neck supple.     Thyroid: No thyromegaly.  Cardiovascular:     Rate and Rhythm: Normal rate and regular rhythm.     Pulses: Normal pulses.     Heart sounds: Normal heart sounds.  Pulmonary:     Effort: Pulmonary effort is normal. No respiratory distress.     Breath sounds: Normal breath sounds.  Abdominal:     General: Bowel sounds are normal. There is no distension.     Palpations: Abdomen is soft.     Tenderness: There is no abdominal tenderness.   Musculoskeletal:     Right lower leg: No edema.     Left lower leg: No edema.     Comments: History of cervical spine surgery Is able to move all extremities Has chronic limited ROM to neck Has right fingers contractures Decreased ROM to bilateral shoulders Increased difficulty with grip     Lymphadenopathy:     Cervical: No cervical adenopathy.  Skin:    General: Skin is warm and dry.  Neurological:     Mental Status: He is alert. Mental status is at baseline.  Psychiatric:        Mood and Affect: Mood normal.        ASSESSMENT/ PLAN:  TODAY  1. Degenerative disease disease lumbar: is stable will continue mobic 15 mg nightly gabapentin 100 mg nightly will monitor   2. Dyslipidemia is stable will continue lipitor 20 mg daily   3. 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 Essential hypertension: stable b/p 143/79 will continue lisinopril 5 mg daily and norvasc 5 mg daily   5. Sleep apnea; obstructive: is stable uses CPAP nightly   6. GERD without esophagitis: is stable will continue protonix 40 mg daily   7. Vascular dementia without behavioral disturbance: is without change weight is 194 pounds will continue aricept 10 mg nightly    MD is aware of resident's narcotic use and is in agreement with current plan of care. We will attempt to wean resident as appropriate.  Ok Edwards NP [facility]  Adult Medicine  Contact [phone] Monday through Friday 8am- 5pm  After hours call [phone]

## 2019-10-04 ENCOUNTER — Encounter: Payer: Self-pay | Admitting: Adult Health

## 2019-10-04 NOTE — Progress Notes (Signed)
Location:    [facility] Room Number: 132/P Place of Service:  SNF (31)   CODE STATUS: DNR  Allergies  Allergen Reactions  . Penicillins Rash  . Sulfonamide Derivatives Rash  . Tetanus Toxoid Rash    Chief Complaint  Patient presents with  . Acute Visit    Weight Loss    HPI:    Past Medical History:  Diagnosis Date  . Cancer (HCC)    melanoma-left side  . Chronic pain    legs and feet  . Coronary artery disease   . Dementia ([place])   . Diabetes mellitus without complication ([place])   . Foot drop   . GERD (gastroesophageal reflux disease)   . Gout   . Hyperlipidemia   . Hypertension   . OCD (obsessive compulsive disorder)   . PONV (postoperative nausea and vomiting)   . Psychosis ([place])   . Reflux   . Sarcoidosis   . Sleep apnea    cpap    Past Surgical History:  Procedure Laterality Date  . BACK SURGERY     cervical neck  . CATARACT EXTRACTION W/PHACO  01/05/2012   Procedure: CATARACT EXTRACTION PHACO AND INTRAOCULAR LENS PLACEMENT (IOC);  Surgeon: [name] Branch, MD;  Location: [facility];  Service: Ophthalmology;  Laterality: Left;  CDE 18.47  . CATARACT EXTRACTION W/PHACO  02/02/2012   Procedure: CATARACT EXTRACTION PHACO AND INTRAOCULAR LENS PLACEMENT (IOC);  Surgeon: [name] Branch, MD;  Location: [facility];  Service: Ophthalmology;  Laterality: Right;  CDE:15.38  . CHOLECYSTECTOMY    . GALLBLADDER SURGERY    . hemorhoidectomy    . MELANOMA EXCISION     left side-[name]  . TONSILLECTOMY      Social History   Socioeconomic History  . Marital status: Married    Spouse name: Not on file  . Number of children: Not on file  . Years of education: Not on file  . Highest education level: Not on file  Occupational History  . Occupation: retired   Scientific laboratory technician  . Financial resource strain: Not hard at all  . Food insecurity    Worry: Never true    Inability: Never true  . Transportation needs    Medical: No    Non-medical: No  Tobacco Use   . Smoking status: Former Smoker    Packs/day: 0.25    Years: 20.00    Pack years: 5.00    Types: Pipe    Quit date: 01/02/1974    Years since quitting: 45.7  . Smokeless tobacco: Never Used  Substance and Sexual Activity  . Alcohol use: No  . Drug use: No  . Sexual activity: Not Currently  Lifestyle  . Physical activity    Days per week: 0 days    Minutes per session: 0 min  . Stress: Not at all  Relationships  . Social Herbalist on phone: Never    Gets together: Never    Attends religious service: Never    Active member of club or organization: No    Attends meetings of clubs or organizations: Not on file    Relationship status: Married  . Intimate partner violence    Fear of current or ex partner: No    Emotionally abused: No    Physically abused: No    Forced sexual activity: No  Other Topics Concern  . Not on file  Social History Narrative   Former pipe smoker, quit many years ago.  [facility] resident of [facility]      Family History  Problem Relation Age of Onset  . Heart disease Other   . Arthritis Other   . Cancer Other   . Anesthesia problems Neg Hx   . Hypotension Neg Hx   . Malignant hyperthermia Neg Hx   . Pseudochol deficiency Neg Hx       VITAL SIGNS BP (!) 143/79   Pulse 81   Temp 97.8 F (36.6 C) (Oral)   Resp 20   Ht 5\' 9"  (1.753 m)   Wt 187 lb 12.8 oz (85.2 kg)   SpO2 97%   BMI 27.73 kg/m   Outpatient Encounter Medications as of 10/04/2019  Medication Sig  . acetaminophen (TYLENOL) 500 MG tablet Take 500 mg by mouth every 6 (six) hours as needed. For pain   . allopurinol (ZYLOPRIM) 300 MG tablet Take 300 mg by mouth daily.    [name] (NORVASC) 5 MG tablet Take 1 tablet (5 mg total) by mouth daily.  [name] EC 81 MG tablet Take 81 mg by mouth daily.  [name] (LIPITOR) 20 MG tablet Take 1 tablet (20 mg total) by mouth daily.  Roseanne Kaufman Peru-Castor Oil (VENELEX) OINT Apply to sacrum, coccyx, and bilateral buttocks  each shift and prn   . donepezil (ARICEPT) 10 MG tablet Take 10 mg by mouth at bedtime.   . gabapentin (NEURONTIN) 100 MG capsule Take 100 mg by mouth at bedtime.  [name] (ZESTRIL) 5 MG tablet Take 1 tablet (5 mg total) by mouth daily.  . meloxicam (MOBIC) 15 MG tablet Take 15 mg by mouth daily.   . Multiple Vitamin (MULITIVITAMIN WITH MINERALS) TABS Take 1 tablet by mouth daily.  . NON FORMULARY Diet: Regular,NAS, Consistent Carbohydrate  . NON FORMULARY CPAP while sleeping. May use CPAP from home at previous home settings. Every Shift Day, Evening, Night  . NON FORMULARY Clean CPAP mask and tubing with baby shampoo and rinse well with warm water weekly and PRN Special Instructions: Clean weekly and PRN Once A Day on Tue 03:15 PM - 11:15 PM  . pantoprazole (PROTONIX) 40 MG tablet Take 1 tablet (40 mg total) by mouth daily.  . Vitamins A & D (VITAMIN A & D) ointment Apply A&D ointment to right foot and ankle qshift & prn for prevention. Every Shift  . [DISCONTINUED] ondansetron (ZOFRAN) 4 MG tablet Take 1 tablet (4 mg total) by mouth every 6 (six) hours as needed for nausea.   No facility-administered encounter medications on file as of 10/04/2019.      SIGNIFICANT DIAGNOSTIC EXAMS       ASSESSMENT/ PLAN:    MD is aware of resident's narcotic use and is in agreement with current plan of care. We will attempt to wean resident as appropriate.  Ok Edwards NP [facility] Adult Medicine  Contact [phone] Monday through Friday 8am- 5pm  After hours call [phone]

## 2019-10-05 ENCOUNTER — Encounter (HOSPITAL_COMMUNITY)
Admission: RE | Admit: 2019-10-05 | Discharge: 2019-10-05 | Disposition: A | Discharge status: Home / Self Care | Payer: Medicare Other | Source: Ambulatory Visit | Attending: Internal Medicine | Admitting: Internal Medicine

## 2019-10-05 DIAGNOSIS — U071 COVID-19: Secondary | ICD-10-CM | POA: Diagnosis not present

## 2019-10-05 DIAGNOSIS — E119 Type 2 diabetes mellitus without complications: Secondary | ICD-10-CM | POA: Diagnosis present

## 2019-10-05 LAB — BASIC METABOLIC PANEL
Anion gap: 9 (ref 5–15)
BUN: 18 mg/dL (ref 8–23)
CO2: 22 mmol/L (ref 22–32)
Calcium: 8.6 mg/dL — ABNORMAL LOW (ref 8.9–10.3)
Chloride: 97 mmol/L — ABNORMAL LOW (ref 98–111)
Creatinine, Ser: 0.53 mg/dL — ABNORMAL LOW (ref 0.61–1.24)
GFR calc Af Amer: >60 mL/min (ref >60)
GFR calc non Af Amer: >60 mL/min (ref >60)
Glucose, Bld: 100 mg/dL — ABNORMAL HIGH (ref 70–99)
Potassium: 3.9 mmol/L (ref 3.5–5.1)
Sodium: 128 mmol/L — ABNORMAL LOW (ref 135–145)

## 2019-10-05 LAB — CBC
HCT: 36.2 % — ABNORMAL LOW (ref 39.0–52.0)
Hemoglobin: 11.8 g/dL — ABNORMAL LOW (ref 13.0–17.0)
MCH: 29.3 pg (ref 26.0–34.0)
MCHC: 32.6 g/dL (ref 30.0–36.0)
MCV: 89.8 fL (ref 80.0–100.0)
Platelets: 256 10*3/uL (ref 150–400)
RBC: 4.03 MIL/uL — ABNORMAL LOW (ref 4.22–5.81)
RDW: 14.8 % (ref 11.5–15.5)
WBC: 4.8 10*3/uL (ref 4.0–10.5)
nRBC: 0 % (ref 0.0–0.2)

## 2019-10-05 LAB — D-DIMER, QUANTITATIVE: D-Dimer, Quant: 3.36 ug/mL-FEU — ABNORMAL HIGH (ref 0.00–0.50)

## 2019-10-05 LAB — FERRITIN: Ferritin: 965 ng/mL — ABNORMAL HIGH (ref 24–336)

## 2019-10-07 ENCOUNTER — Telehealth: Payer: Self-pay | Admitting: Critical Care Medicine

## 2019-10-07 ENCOUNTER — Encounter: Payer: Self-pay | Admitting: Adult Health

## 2019-10-07 ENCOUNTER — Non-Acute Institutional Stay (SKILLED_NURSING_FACILITY): Payer: Medicare Other | Admitting: Adult Health

## 2019-10-07 ENCOUNTER — Other Ambulatory Visit: Payer: Self-pay | Admitting: Critical Care Medicine

## 2019-10-07 DIAGNOSIS — U071 COVID-19: Secondary | ICD-10-CM

## 2019-10-07 DIAGNOSIS — F0150 Vascular dementia without behavioral disturbance: Secondary | ICD-10-CM

## 2019-10-07 DIAGNOSIS — I10 Essential (primary) hypertension: Secondary | ICD-10-CM

## 2019-10-07 DIAGNOSIS — I2510 Atherosclerotic heart disease of native coronary artery without angina pectoris: Secondary | ICD-10-CM

## 2019-10-07 NOTE — Progress Notes (Signed)
  I connected by phone with Eric Bowman on 10/07/2019 at 2:23 PM to discuss the potential use of an new treatment for mild to moderate COVID-19 viral infection in non-hospitalized patients.  This patient is a 85 y.o. male that meets the FDA criteria for Emergency Use Authorization of bamlanivimab or casirivimab\imdevimab.  Has a (+) direct SARS-CoV-2 viral test result  Has mild or moderate COVID-19   Is ? 85 years of age and weighs ? 40 kg  Is NOT hospitalized due to COVID-19  Is NOT requiring oxygen therapy or requiring an increase in baseline oxygen flow rate due to COVID-19  Is within 10 days of symptom onset  Has at least one of the high risk factor(s) for progression to severe COVID-19 and/or hospitalization as defined in EUA.  Specific high risk criteria : >/= 83 yo CAD , HTN  Patient Active Problem List   Diagnosis Date Noted  . Radiculopathy of cervical spine 09/09/2019  . Dementia without behavioral disturbance ([place]) 07/06/2019  . Dyslipidemia 06/24/2019  . Vascular dementia without behavioral disturbance ([place]) 06/24/2019  . Chronic gout without tophus 06/24/2019  . Physical deconditioning 06/24/2019  . Multiple falls 06/20/2019  . Arthritis of knee, left 01/03/2013  . Degenerative disc disease, lumbar 01/03/2013  . Coronary artery disease 09/13/2011  . Obstructive sleep apnea on CPAP 09/10/2007  . Essential hypertension 05/02/2007  . GERD 05/02/2007    I have spoken and communicated the following to the patient or parent/caregiver:  1. FDA has authorized the emergency use of bamlanivimab for the treatment of mild to moderate COVID-19 in adults and pediatric patients with positive results of direct SARS-CoV-2 viral testing who are 53 years of age and older weighing at least 40 kg, and who are at high risk for progressing to severe COVID-19 and/or hospitalization.  2. The significant known and potential risks and benefits of bamlanivimab, and the extent to which such  potential risks and benefits are unknown.  3. Information on available alternative treatments and the risks and benefits of those alternatives, including clinical trials.  4. Patients treated with bamlanivimab should continue to self-isolate and use infection control measures (e.g., wear mask, isolate, social distance, avoid sharing personal items, clean and disinfect "high touch" surfaces, and frequent handwashing) according to CDC guidelines.   5. The patient or parent/caregiver has the option to accept or refuse bamlanivimab.  After reviewing this information with the patient, The patient agreed to proceed with receiving the infusion of bamlanivimab and will be provided a copy of the Fact sheet prior to receiving the infusion.Asencion Noble 10/07/2019 2:23 PM

## 2019-10-07 NOTE — Progress Notes (Signed)
Location:    [facility] Room Number: 147/W Place of Service:  SNF (31)   CODE STATUS: DNR  Allergies  Allergen Reactions  . Penicillins Rash  . Sulfonamide Derivatives Rash  . Tetanus Toxoid Rash    Chief Complaint  Patient presents with  . Acute Visit    Covid -19    HPI:  He has been diagnosed with covid 19. He is appropriate for monoclonal antibodies. We have discussed this therapy with his family regarding pros and cons. They have agreed with this therapy. There are no reports of fevers; no complaints of muscle aches and pain. No cough or shortness of breath.    Past Medical History:  Diagnosis Date  . Cancer (HCC)    melanoma-left side  . Chronic pain    legs and feet  . Coronary artery disease   . Dementia ([place])   . Diabetes mellitus without complication ([place])   . Foot drop   . GERD (gastroesophageal reflux disease)   . Gout   . Hyperlipidemia   . Hypertension   . OCD (obsessive compulsive disorder)   . PONV (postoperative nausea and vomiting)   . Psychosis ([place])   . Reflux   . Sarcoidosis   . Sleep apnea    cpap    Past Surgical History:  Procedure Laterality Date  . BACK SURGERY     cervical neck  . CATARACT EXTRACTION W/PHACO  01/05/2012   Procedure: CATARACT EXTRACTION PHACO AND INTRAOCULAR LENS PLACEMENT (IOC);  Surgeon: [name] Branch, MD;  Location: [facility];  Service: Ophthalmology;  Laterality: Left;  CDE 18.47  . CATARACT EXTRACTION W/PHACO  02/02/2012   Procedure: CATARACT EXTRACTION PHACO AND INTRAOCULAR LENS PLACEMENT (IOC);  Surgeon: [name] Branch, MD;  Location: [facility];  Service: Ophthalmology;  Laterality: Right;  CDE:15.38  . CHOLECYSTECTOMY    . GALLBLADDER SURGERY    . hemorhoidectomy    . MELANOMA EXCISION     left side-[name]  . TONSILLECTOMY      Social History   Socioeconomic History  . Marital status: Married    Spouse name: Not on file  . Number of children: Not on file  . Years of education: Not on  file  . Highest education level: Not on file  Occupational History  . Occupation: retired   Scientific laboratory technician  . Financial resource strain: Not hard at all  . Food insecurity    Worry: Never true    Inability: Never true  . Transportation needs    Medical: No    Non-medical: No  Tobacco Use  . Smoking status: Former Smoker    Packs/day: 0.25    Years: 20.00    Pack years: 5.00    Types: Pipe    Quit date: 01/02/1974    Years since quitting: 45.7  . Smokeless tobacco: Never Used  Substance and Sexual Activity  . Alcohol use: No  . Drug use: No  . Sexual activity: Not Currently  Lifestyle  . Physical activity    Days per week: 0 days    Minutes per session: 0 min  . Stress: Not at all  Relationships  . Social Herbalist on phone: Never    Gets together: Never    Attends religious service: Never    Active member of club or organization: No    Attends meetings of clubs or organizations: Not on file    Relationship status: Married  . Intimate partner violence  Fear of current or ex partner: No    Emotionally abused: No    Physically abused: No    Forced sexual activity: No  Other Topics Concern  . Not on file  Social History Narrative   Former pipe smoker, quit many years ago.   Long term resident of [facility]      Family History  Problem Relation Age of Onset  . Heart disease Other   . Arthritis Other   . Cancer Other   . Anesthesia problems Neg Hx   . Hypotension Neg Hx   . Malignant hyperthermia Neg Hx   . Pseudochol deficiency Neg Hx       VITAL SIGNS BP 121/66   Pulse 70   Temp (!) 97.1 F (36.2 C) (Oral)   Resp 20   Ht 5\' 9"  (1.753 m)   Wt 187 lb 12.8 oz (85.2 kg)   SpO2 97%   BMI 27.73 kg/m   Outpatient Encounter Medications as of 10/07/2019  Medication Sig  . acetaminophen (TYLENOL) 500 MG tablet Take 500 mg by mouth every 6 (six) hours as needed. For pain   . albuterol (VENTOLIN HFA) 108 (90 Base) MCG/ACT inhaler Inhale 2 puffs into the  lungs every 4 (four) hours as needed for wheezing or shortness of breath.  Derrill Memo ON 10/08/2019] albuterol (VENTOLIN HFA) 108 (90 Base) MCG/ACT inhaler Inhale 4 puffs into the lungs once as needed for wheezing or shortness of breath.  . allopurinol (ZYLOPRIM) 300 MG tablet Take 300 mg by mouth daily.    [name] (NORVASC) 5 MG tablet Take 1 tablet (5 mg total) by mouth daily.  . Ascorbic Acid (VITAMIN C) 1000 MG tablet Take 1,000 mg by mouth daily.  [name] EC 81 MG tablet Take 81 mg by mouth daily.  [name] (LIPITOR) 20 MG tablet Take 1 tablet (20 mg total) by mouth daily.  Roseanne Kaufman Peru-Castor Oil (VENELEX) OINT Apply to sacrum, coccyx, and bilateral buttocks each shift and prn   . [START ON 10/08/2019] diphenhydrAMINE HCl 50 MG/50ML LIQD Take by mouth. ; amt: 50mg ; intravenous  Special Instructions: As needed for adverse response during Monoclonal antibody therapy infusion and stop Monoclonal infusion. Once - One Time - PRN PRN 1  . donepezil (ARICEPT) 10 MG tablet Take 10 mg by mouth at bedtime.   . ergocalciferol (VITAMIN D2) 1.25 MG (50000 UT) capsule Take 50,000 Units by mouth once a week. Once a day on Saturday  . [START ON 10/08/2019] famotidine 20 MG/2ML SOLN Inject into the vein. amt: 20mg  total; intravenous  Special Instructions: As needed for adverse response during Monoclonal antibody therapy infusion and stop Monoclonal infusion. Once - One Time - PRN PRN 1  . gabapentin (NEURONTIN) 100 MG capsule Take 100 mg by mouth at bedtime.  [name] (ZESTRIL) 5 MG tablet Take 1 tablet (5 mg total) by mouth daily.  . meloxicam (MOBIC) 15 MG tablet Take 15 mg by mouth daily.   Derrill Memo ON 10/08/2019] methylPREDNISolone sodium succinate (SOLU-MEDROL) 125 mg/2 mL injection Inject 125 mg into the vein once.  . Multiple Vitamin (MULITIVITAMIN WITH MINERALS) TABS Take 1 tablet by mouth daily.  . NON FORMULARY Diet: Regular,NAS, Consistent Carbohydrate  . NON FORMULARY CPAP while  sleeping. May use CPAP from home at previous home settings. Every Shift Day, Evening, Night  . NON FORMULARY Clean CPAP mask and tubing with baby shampoo and rinse well with warm water weekly and PRN Special Instructions: Clean weekly  and PRN Once A Day on Tue 03:15 PM - 11:15 PM  . [START ON 10/08/2019] ondansetron (ZOFRAN) 40 MG/20ML SOLN injection Inject 4 mg into the vein once.  . pantoprazole (PROTONIX) 40 MG tablet Take 1 tablet (40 mg total) by mouth daily.  . Vitamins A & D (VITAMIN A & D) ointment Apply A&D ointment to right foot and ankle qshift & prn for prevention. Every Shift  . zinc sulfate 220 (50 Zn) MG capsule Take 220 mg by mouth daily.   No facility-administered encounter medications on file as of 10/07/2019.      SIGNIFICANT DIAGNOSTIC EXAMS   PREVIOUS;   06-19-19: chest x-Joven: Minimal right basilar subsegmental atelectasis   06-19-19: ct of head: Mild diffuse cortical atrophy. No acute intracranial abnormality seen.  06-19-19: ct of lumbar spine:  1. No acute/traumatic lumbar spine pathology. 2. Osteopenia with extensive multilevel degenerative changes of the spine. 3. Lumbar levoscoliosis and multilevel disc bulge and neural foraminal narrowing. MRI may provide better evaluation. Aortic Atherosclerosis   06-22-19: lumbar spine x-Dracen: 1. Degenerative changes. 2. No evidence for acute abnormality  NO NEW EXAMS.   LABS REVIEWED PREVIOUS;   06-19-19: wbc 8.4; hgb 14.2; hct 44.4; mcv 93.7 plt 317; glucose 106; bun 26; creat 0.82; k+ 4.1; na++ 138; ca 8.8; liver normal albumin 3.4; urine culture no growth 06-20-19-: wbc 7.7; hgb 13.1 hct 42.2; mcv 94.8; plt 306; glucose 108; bun 24; creat 0.80; k+ 3.9; na++ 138; ca 8.8 ;liver normal albumin 3.3  08-23-19: hgb a1c 5.6   TODAY   10-05-19: wbc 4.8; hgb 11.8; hct 36.2; mcv 89.8 plt 256; glucose 100; bun 18; creat 0.53 ;k+ 3.9; an++ 128; ca 8.6; d-dimer: 3.36 ferritin 965  Review of Systems  Constitutional: Negative  for malaise/fatigue.  Respiratory: Negative for cough and shortness of breath.   Cardiovascular: Negative for chest pain, palpitations and leg swelling.  Gastrointestinal: Negative for abdominal pain, constipation and heartburn.  Musculoskeletal: Negative for back pain, joint pain and myalgias.  Skin: Negative.   Neurological: Negative for dizziness.  Psychiatric/Behavioral: The patient is not nervous/anxious.     Physical Exam Constitutional:      General: He is not in acute distress.    Appearance: He is well-developed. He is not diaphoretic.  Eyes:     Comments: History of bilateral cataract with lens implants   Neck:     Thyroid: No thyromegaly.  Cardiovascular:     Rate and Rhythm: Normal rate and regular rhythm.     Pulses: Normal pulses.     Heart sounds: Normal heart sounds.  Pulmonary:     Effort: Pulmonary effort is normal. No respiratory distress.     Breath sounds: Normal breath sounds.  Abdominal:     General: Bowel sounds are normal. There is no distension.     Palpations: Abdomen is soft.     Tenderness: There is no abdominal tenderness.  Musculoskeletal:     Cervical back: Neck supple.     Right lower leg: No edema.     Left lower leg: No edema.     Comments: History of cervical spine surgery Is able to move all extremities Has chronic limited ROM to neck Has right fingers contractures Decreased ROM to bilateral shoulders Increased difficulty with grip      Lymphadenopathy:     Cervical: No cervical adenopathy.  Skin:    General: Skin is warm and dry.  Neurological:     Mental Status: He is alert.  Mental status is at baseline.  Psychiatric:        Mood and Affect: Mood normal.         ASSESSMENT/ PLAN:  TODAY'  1. covid 19  Is presently stable Will setup for him to receive monoclonal antibodies Will monitor his status.     MD is aware of resident's narcotic use and is in agreement with current plan of care. We will attempt to wean  resident as appropriate.  Ok Edwards NP [facility]  Contact [phone] Monday through Friday 8am- 5pm  After hours call [phone]

## 2019-10-07 NOTE — Telephone Encounter (Signed)
I connected with this patient through [place] practitioner at [facility].  This is a long-term care resident who has Covid infection and would benefit from monoclonal antibody to reduce risk of emergency room and hospital utilization  Discussed with patient about Covid symptoms and the use of bamlanivimab, a monoclonal antibody infusion for those with mild to moderate Covid symptoms and at a high risk of hospitalization.  Pt is qualified for this infusion at the [facility] infusion center due to Age > 75 and Hypertension  CAD and age >16, which were addressed with the patient and are actively being managed by a [place] provider.    After discussing the infusion's costs, potential benefits and side effects, the patient has decided to accept treatment with monoclonal antibodies.

## 2019-10-08 ENCOUNTER — Ambulatory Visit (HOSPITAL_COMMUNITY): Payer: Medicare Other | Attending: Critical Care Medicine

## 2019-10-08 MED ORDER — SODIUM CHLORIDE 0.9 % IV SOLN
125.0000 mg | Freq: Once | INTRAVENOUS | Status: DC | PRN
  Filled 2019-10-08: qty 1.04

## 2019-10-08 MED ORDER — EPINEPHRINE 0.3 MG/0.3ML IJ SOAJ
0.3000 mg | Freq: Once | INTRAMUSCULAR | Status: DC | PRN
  Filled 2019-10-08: qty 0.6

## 2019-10-08 MED ORDER — SODIUM CHLORIDE 0.9 % IV SOLN
20.0000 mg | Freq: Once | INTRAVENOUS | Status: DC | PRN
  Filled 2019-10-08: qty 2

## 2019-10-08 MED ORDER — ALBUTEROL SULFATE HFA 108 (90 BASE) MCG/ACT IN AERS
2.0000 | INHALATION_SPRAY | Freq: Once | RESPIRATORY_TRACT | Status: DC | PRN
  Filled 2019-10-08: qty 6.7

## 2019-10-08 MED ORDER — DIPHENHYDRAMINE HCL 50 MG/ML IJ SOLN
50.0000 mg | Freq: Once | INTRAMUSCULAR | Status: DC | PRN
  Filled 2019-10-08: qty 1

## 2019-10-08 MED ORDER — SODIUM CHLORIDE 0.9 % IV SOLN
INTRAVENOUS | Status: DC | PRN
  Filled 2019-10-08: qty 1000

## 2019-10-08 MED ORDER — SODIUM CHLORIDE 0.9 % IV SOLN
700.0000 mg | Freq: Once | INTRAVENOUS | Status: DC
  Filled 2019-10-08: qty 20

## 2019-10-09 ENCOUNTER — Encounter: Payer: Self-pay | Admitting: Internal Medicine

## 2019-10-09 ENCOUNTER — Non-Acute Institutional Stay (SKILLED_NURSING_FACILITY): Payer: Medicare Other | Admitting: Internal Medicine

## 2019-10-09 DIAGNOSIS — U071 COVID-19: Secondary | ICD-10-CM

## 2019-10-09 NOTE — Progress Notes (Signed)
Location:  [name] Room Number: 147-W Place of Service:  SNF (31)  Eric Duos, MD  Patient Care Team: Eric Duos, MD as PCP - General (Internal Medicine) [name] [name], NP as Nurse Practitioner ([place]) Center, [name] ([place])  Extended Emergency Contact Information Primary Emergency Contact: [name] Address: [address]          [name], [name] [address] [name] of [name] Phone: [phone] Work Phone: [phone] Relation: Spouse    Allergies: Penicillins, Sulfonamide derivatives, and Tetanus toxoid  Chief Complaint  Patient presents with  . Acute Visit    Patient seen for positive COVID swab    HPI: Patient is an 85 y.o. male who is being seen because he tested Covid positive.  Patient has no symptoms.  Patient did get monoclonal antibodies at SNF  Past Medical History:  Diagnosis Date  . Cancer (HCC)    melanoma-left side  . Chronic pain    legs and feet  . Coronary artery disease   . Dementia ([place])   . Diabetes mellitus without complication ([place])   . Foot drop   . GERD (gastroesophageal reflux disease)   . Gout   . Hyperlipidemia   . Hypertension   . OCD (obsessive compulsive disorder)   . PONV (postoperative nausea and vomiting)   . Psychosis ([place])   . Reflux   . Sarcoidosis   . Sleep apnea    cpap    Past Surgical History:  Procedure Laterality Date  . BACK SURGERY     cervical neck  . CATARACT EXTRACTION W/PHACO  01/05/2012   Procedure: CATARACT EXTRACTION PHACO AND INTRAOCULAR LENS PLACEMENT (IOC);  Surgeon: [name] Branch, MD;  Location: [facility];  Service: Ophthalmology;  Laterality: Left;  CDE 18.47  . CATARACT EXTRACTION W/PHACO  02/02/2012   Procedure: CATARACT EXTRACTION PHACO AND INTRAOCULAR LENS PLACEMENT (IOC);  Surgeon: [name] Branch, MD;  Location: [facility];  Service: Ophthalmology;  Laterality: Right;  CDE:15.38  . CHOLECYSTECTOMY    .  GALLBLADDER SURGERY    . hemorhoidectomy    . MELANOMA EXCISION     left side-[name]  . TONSILLECTOMY      Allergies as of 10/09/2019      Reactions   Penicillins Rash   Sulfonamide Derivatives Rash   Tetanus Toxoid Rash      Medication List    Notice   This visit is during an admission. Changes to the med list made in this visit will be reflected in the After Visit Summary of the admission.    Current Facility-Administered Medications on File Prior to Visit  Medication Dose Route Frequency Provider Last Rate Last Admin  . 0.9 %  sodium chloride infusion   Intravenous PRN Elsie Stain, MD      . albuterol (VENTOLIN HFA) 108 (90 Base) MCG/ACT inhaler 2 puff  2 puff Inhalation Once PRN Elsie Stain, MD      . bamlanivimab (EUA) 700 mg in sodium chloride 0.9 % 200 mL IVPB  700 mg Intravenous Once Elsie Stain, MD      . diphenhydrAMINE (BENADRYL) injection 50 mg  50 mg Intravenous Once PRN Elsie Stain, MD      . EPINEPHrine (EPI-PEN) injection 0.3 mg  0.3 mg Intramuscular Once PRN Elsie Stain, MD      . famotidine (PEPCID) 20 mg in sodium chloride 0.9 % 50 mL IVPB  20 mg Intravenous Once PRN [name],  [name], MD      . methylPREDNISolone sodium succinate (SOLU-MEDROL) 130 mg in sodium chloride 0.9 % 50 mL IVPB  130 mg Intravenous Once PRN Elsie Stain, MD       Current Outpatient Medications on File Prior to Visit  Medication Sig Dispense Refill  . acetaminophen (TYLENOL) 500 MG tablet Take 500 mg by mouth every 6 (six) hours as needed. For pain     . albuterol (VENTOLIN HFA) 108 (90 Base) MCG/ACT inhaler Inhale 2 puffs into the lungs every 4 (four) hours as needed for wheezing or shortness of breath.    . allopurinol (ZYLOPRIM) 300 MG tablet Take 300 mg by mouth daily.      [name] (NORVASC) 5 MG tablet Take 1 tablet (5 mg total) by mouth daily.    . Ascorbic Acid (VITAMIN C) 1000 MG tablet Take 1,000 mg by mouth daily.    [name] EC 81 MG  tablet Take 81 mg by mouth daily.    [name] (LIPITOR) 20 MG tablet Take 1 tablet (20 mg total) by mouth daily. 30 tablet 11  . Balsam Peru-Castor Oil (VENELEX) OINT Apply to sacrum, coccyx, and bilateral buttocks each shift and prn     . donepezil (ARICEPT) 10 MG tablet Take 10 mg by mouth at bedtime.     . ergocalciferol (VITAMIN D2) 1.25 MG (50000 UT) capsule Take 50,000 Units by mouth once a week. Once a day on Saturday    . gabapentin (NEURONTIN) 100 MG capsule Take 100 mg by mouth at bedtime.    [name] (ZESTRIL) 5 MG tablet Take 1 tablet (5 mg total) by mouth daily.    . meloxicam (MOBIC) 15 MG tablet Take 15 mg by mouth daily.     . Multiple Vitamin (MULITIVITAMIN WITH MINERALS) TABS Take 1 tablet by mouth daily.    . NON FORMULARY Diet: Regular,NAS, Consistent Carbohydrate    . NON FORMULARY CPAP while sleeping. May use CPAP from home at previous home settings. Every Shift Day, Evening, Night    . NON FORMULARY Clean CPAP mask and tubing with baby shampoo and rinse well with warm water weekly and PRN Special Instructions: Clean weekly and PRN Once A Day on Tue 03:15 PM - 11:15 PM    . pantoprazole (PROTONIX) 40 MG tablet Take 1 tablet (40 mg total) by mouth daily.    . Vitamins A & D (VITAMIN A & D) ointment Apply A&D ointment to right foot and ankle qshift & prn for prevention. Every Shift    . zinc sulfate 220 (50 Zn) MG capsule Take 220 mg by mouth daily.       No orders of the defined types were placed in this encounter.   Immunization History  Administered Date(s) Administered  . Influenza Whole 08/15/2007  . Influenza-Unspecified 07/11/2019  . Pneumococcal Conjugate-13 08/05/2019    Social History   Tobacco Use  . Smoking status: Former Smoker    Packs/day: 0.25    Years: 20.00    Pack years: 5.00    Types: Pipe    Quit date: 01/02/1974    Years since quitting: 45.7  . Smokeless tobacco: Never Used  Substance Use Topics  . Alcohol use: No     Review of Systems  GENERAL:  no fevers, fatigue, appetite changes SKIN: No itching, rash HEENT: No complaint RESPIRATORY: No cough, wheezing, SOB CARDIAC: No chest pain, palpitations, lower extremity edema  GI: No abdominal pain, No N/V/D or  constipation, No heartburn or reflux  GU: No dysuria, frequency or urgency, or incontinence  MUSCULOSKELETAL: No unrelieved bone/joint pain NEUROLOGIC: No headache, dizziness  PSYCHIATRIC: No overt anxiety or sadness  Vitals:   10/09/19 1638  BP: 106/66  Pulse: 78  Resp: 20  Temp: 98.2 F (36.8 C)  SpO2: 97%   Body mass index is 27.73 kg/m. Physical Exam  GENERAL APPEARANCE: Alert, conversant, No acute distress  SKIN: No diaphoresis rash HEENT: Unremarkable RESPIRATORY: Breathing is even, unlabored. Lung sounds are clear   CARDIOVASCULAR: Heart RRR no murmurs, rubs or gallops. No peripheral edema  GASTROINTESTINAL: Abdomen is soft, non-tender, not distended w/ normal bowel sounds.  GENITOURINARY: Bladder non tender, not distended  MUSCULOSKELETAL: No abnormal joints or musculature NEUROLOGIC: Cranial nerves 2-12 grossly intact. Moves all extremities PSYCHIATRIC: Mood and affect appropriate to situation, no behavioral issues  Patient Active Problem List   Diagnosis Date Noted  . Radiculopathy of cervical spine 09/09/2019  . Dementia without behavioral disturbance ([place]) 07/06/2019  . Dyslipidemia 06/24/2019  . Vascular dementia without behavioral disturbance ([place]) 06/24/2019  . Chronic gout without tophus 06/24/2019  . Physical deconditioning 06/24/2019  . Multiple falls 06/20/2019  . Arthritis of knee, left 01/03/2013  . Degenerative disc disease, lumbar 01/03/2013  . Coronary artery disease 09/13/2011  . Obstructive sleep apnea on CPAP 09/10/2007  . Essential hypertension 05/02/2007  . GERD 05/02/2007    CMP     Component Value Date/Time   NA 128 (L) 10/05/2019 1130   K 3.9 10/05/2019 1130   CL 97 (L) 10/05/2019 1130    CO2 22 10/05/2019 1130   GLUCOSE 100 (H) 10/05/2019 1130   BUN 18 10/05/2019 1130   CREATININE 0.53 (L) 10/05/2019 1130   CALCIUM 8.6 (L) 10/05/2019 1130   PROT 6.1 (L) 06/20/2019 0634   ALBUMIN 3.3 (L) 06/20/2019 0634   AST 14 (L) 06/20/2019 0634   ALT 15 06/20/2019 0634   ALKPHOS 74 06/20/2019 0634   BILITOT 0.7 06/20/2019 0634   GFRNONAA >60 10/05/2019 1130   GFRAA >60 10/05/2019 1130   Recent Labs    06/20/19 0634 06/21/19 0556 10/05/19 1130  NA 138 136 128*  K 3.9 3.6 3.9  CL 106 105 97*  CO2 25 24 22   GLUCOSE 108* 94 100*  BUN 24* 19 18  CREATININE 0.80 0.65 0.53*  CALCIUM 8.8* 8.5* 8.6*   Recent Labs    06/19/19 1504 06/20/19 0634  AST 17 14*  ALT 17 15  ALKPHOS 79 74  BILITOT 0.4 0.7  PROT 6.6 6.1*  ALBUMIN 3.4* 3.3*   Recent Labs    06/19/19 1504 06/20/19 0634 10/05/19 1130  WBC 8.4 7.7 4.8  NEUTROABS 5.5  --   --   HGB 14.2 13.1 11.8*  HCT 44.4 42.2 36.2*  MCV 93.7 94.8 89.8  PLT 317 306 256   No results for input(s): CHOL, LDLCALC, TRIG in the last 8760 hours.  Invalid input(s): HCL No results found for: MICROALBUR No results found for: TSH Lab Results  Component Value Date   HGBA1C 5.6 08/23/2019   Lab Results  Component Value Date   CHOL 113 09/02/2008   HDL 40 09/02/2008   LDLCALC 52 09/02/2008   TRIG 104 09/02/2008   CHOLHDL 2.8 Ratio 09/02/2008    Significant Diagnostic Results in last 30 days:  No results found.  Assessment and Plan   COVID-19 positive-patient is getting vitamin C, vitamin D, and zinc.  Patient did get monoclonal antibodies here  SNF.  We will continue observation for any further decline.  Full PPE was required for this visit   Eric Duos, MD

## 2019-10-09 NOTE — Progress Notes (Signed)
This encounter was created in error - please disregard.

## 2019-10-14 NOTE — Progress Notes (Signed)
This encounter was created in error - please disregard.

## 2019-10-15 DIAGNOSIS — U071 COVID-19: Secondary | ICD-10-CM | POA: Insufficient documentation

## 2019-10-16 ENCOUNTER — Other Ambulatory Visit (HOSPITAL_COMMUNITY)
Admission: RE | Admit: 2019-10-16 | Discharge: 2019-10-16 | Disposition: A | Discharge status: Home / Self Care | Payer: Medicare Other | Source: Skilled Nursing Facility | Attending: Adult Health | Admitting: Adult Health

## 2019-10-16 DIAGNOSIS — I10 Essential (primary) hypertension: Secondary | ICD-10-CM | POA: Insufficient documentation

## 2019-10-16 LAB — BASIC METABOLIC PANEL
Anion gap: 8 (ref 5–15)
BUN: 14 mg/dL (ref 8–23)
CO2: 26 mmol/L (ref 22–32)
Calcium: 8.5 mg/dL — ABNORMAL LOW (ref 8.9–10.3)
Chloride: 100 mmol/L (ref 98–111)
Creatinine, Ser: 0.47 mg/dL — ABNORMAL LOW (ref 0.61–1.24)
GFR calc Af Amer: >60 mL/min (ref >60)
GFR calc non Af Amer: >60 mL/min (ref >60)
Glucose, Bld: 96 mg/dL (ref 70–99)
Potassium: 3.7 mmol/L (ref 3.5–5.1)
Sodium: 134 mmol/L — ABNORMAL LOW (ref 135–145)

## 2019-10-18 ENCOUNTER — Encounter: Payer: Self-pay | Admitting: Internal Medicine

## 2019-10-23 ENCOUNTER — Encounter: Payer: Self-pay | Admitting: Internal Medicine

## 2019-10-23 ENCOUNTER — Non-Acute Institutional Stay (SKILLED_NURSING_FACILITY): Payer: Medicare Other | Admitting: Internal Medicine

## 2019-10-23 DIAGNOSIS — B91 Sequelae of poliomyelitis: Secondary | ICD-10-CM | POA: Diagnosis not present

## 2019-10-23 DIAGNOSIS — M89661 Osteopathy after poliomyelitis, right lower leg: Secondary | ICD-10-CM

## 2019-10-23 DIAGNOSIS — R634 Abnormal weight loss: Secondary | ICD-10-CM | POA: Diagnosis not present

## 2019-10-23 NOTE — Progress Notes (Signed)
Location:  [facility] Room Number: 157-W Place of Service:  SNF (31)  Hennie Duos, MD  Patient Care Team: Hennie Duos, MD as PCP - General (Internal Medicine) [name] [name], NP as Nurse Practitioner ([place], [place] ([place])  Extended Emergency Contact Information Primary Emergency Contact: [facility] Address: [address]          [place], [place] [address] [name] of [place] Phone: [phone] Work Phone: [phone] Relation: Spouse    Allergies: Penicillins, Sulfonamide derivatives, and Tetanus toxoid  Chief Complaint  Patient presents with  . Acute Visit    Patient is seen for weight loss.    HPI: Patient is an 85 y.o. male who is being seen for weight loss.  At the beginning of November patient weighed 196.6 pounds with a BMI of 29 and is in December he weighs 181.2 pounds with a BMI of 26.76.  Patient says he has no complaints patient says his food tastes good.  Patient was diagnosed with COVID-19 disease in early December, but has been asymptomatic.  Past Medical History:  Diagnosis Date  . Cancer (HCC)    melanoma-left side  . Chronic pain    legs and feet  . Coronary artery disease   . Dementia ([place])   . Diabetes mellitus without complication ([place])   . Foot drop   . GERD (gastroesophageal reflux disease)   . Gout   . Hyperlipidemia   . Hypertension   . OCD (obsessive compulsive disorder)   . PONV (postoperative nausea and vomiting)   . Psychosis ([place])   . Reflux   . Sarcoidosis   . Sleep apnea    cpap    Past Surgical History:  Procedure Laterality Date  . BACK SURGERY     cervical neck  . CATARACT EXTRACTION W/PHACO  01/05/2012   Procedure: CATARACT EXTRACTION PHACO AND INTRAOCULAR LENS PLACEMENT (IOC);  Surgeon: [name] Branch, MD;  Location: [facility];  Service: Ophthalmology;  Laterality: Left;  CDE 18.47  . CATARACT EXTRACTION W/PHACO  02/02/2012     Procedure: CATARACT EXTRACTION PHACO AND INTRAOCULAR LENS PLACEMENT (IOC);  Surgeon: [name] Branch, MD;  Location: [facility];  Service: Ophthalmology;  Laterality: Right;  CDE:15.38  . CHOLECYSTECTOMY    . GALLBLADDER SURGERY    . hemorhoidectomy    . MELANOMA EXCISION     left side-[name]  . TONSILLECTOMY      Allergies as of 10/23/2019      Reactions   Penicillins Rash   Sulfonamide Derivatives Rash   Tetanus Toxoid Rash      Medication List    Notice   This visit is during an admission. Changes to the med list made in this visit will be reflected in the After Visit Summary of the admission.    Current Facility-Administered Medications on File Prior to Visit  Medication Dose Route Frequency Provider Last Rate Last Admin  . 0.9 %  sodium chloride infusion   Intravenous PRN Elsie Stain, MD      . albuterol (VENTOLIN HFA) 108 (90 Base) MCG/ACT inhaler 2 puff  2 puff Inhalation Once PRN Elsie Stain, MD      . bamlanivimab (EUA) 700 mg in sodium chloride 0.9 % 200 mL IVPB  700 mg Intravenous Once Elsie Stain, MD      . diphenhydrAMINE (BENADRYL) injection 50 mg  50 mg Intravenous Once PRN Elsie Stain, MD      .  EPINEPHrine (EPI-PEN) injection 0.3 mg  0.3 mg Intramuscular Once PRN Elsie Stain, MD      . famotidine (PEPCID) 20 mg in sodium chloride 0.9 % 50 mL IVPB  20 mg Intravenous Once PRN Elsie Stain, MD      . methylPREDNISolone sodium succinate (SOLU-MEDROL) 130 mg in sodium chloride 0.9 % 50 mL IVPB  130 mg Intravenous Once PRN Elsie Stain, MD       Current Outpatient Medications on File Prior to Visit  Medication Sig Dispense Refill  . acetaminophen (TYLENOL) 500 MG tablet Take 500 mg by mouth every 6 (six) hours as needed. For pain     . albuterol (VENTOLIN HFA) 108 (90 Base) MCG/ACT inhaler Inhale 2 puffs into the lungs every 4 (four) hours as needed for wheezing or shortness of breath.    . allopurinol (ZYLOPRIM) 300 MG tablet Take  300 mg by mouth daily.      [name] (NORVASC) 5 MG tablet Take 1 tablet (5 mg total) by mouth daily.    [name] EC 81 MG tablet Take 81 mg by mouth daily.    [name] (LIPITOR) 20 MG tablet Take 1 tablet (20 mg total) by mouth daily. 30 tablet 11  . Balsam [place]-Castor Oil (VENELEX) OINT Apply to sacrum, coccyx, and bilateral buttocks each shift and prn     . donepezil (ARICEPT) 10 MG tablet Take 10 mg by mouth at bedtime.     . gabapentin (NEURONTIN) 100 MG capsule Take 100 mg by mouth at bedtime.    . Glucerna (GLUCERNA) LIQD Take 237 mLs by mouth 2 (two) times daily between meals.    [name] (ZESTRIL) 5 MG tablet Take 1 tablet (5 mg total) by mouth daily.    . meloxicam (MOBIC) 15 MG tablet Take 15 mg by mouth daily.     . Multiple Vitamin (MULITIVITAMIN WITH MINERALS) TABS Take 1 tablet by mouth daily.    . NON FORMULARY Diet: Regular,NAS, Consistent Carbohydrate    . NON FORMULARY CPAP while sleeping. May use CPAP from home at previous home settings. Every Shift Day, Evening, Night    . NON FORMULARY Clean CPAP mask and tubing with baby shampoo and rinse well with warm water weekly and PRN Special Instructions: Clean weekly and PRN Once A Day on Tue 03:15 PM - 11:15 PM    . pantoprazole (PROTONIX) 40 MG tablet Take 1 tablet (40 mg total) by mouth daily.    . Vitamins A & D (VITAMIN A & D) ointment Apply A&D ointment to right foot and ankle qshift & prn for prevention. Every Shift       No orders of the defined types were placed in this encounter.   Immunization History  Administered Date(s) Administered  . Influenza Whole 08/15/2007  . Influenza-Unspecified 07/11/2019  . Pneumococcal Conjugate-13 08/05/2019    Social History   Tobacco Use  . Smoking status: Former Smoker    Packs/day: 0.25    Years: 20.00    Pack years: 5.00    Types: Pipe    Quit date: 01/02/1974    Years since quitting: 45.8  . Smokeless tobacco: Never Used  Substance Use Topics  .  Alcohol use: No    Review of Systems  GENERAL:  no fevers, fatigue, appetite changes SKIN: No itching, rash HEENT: No complaint RESPIRATORY: No cough, wheezing, SOB CARDIAC: No chest pain, palpitations, lower extremity edema  GI: No abdominal pain, No N/V/D or constipation,  No heartburn or reflux  GU: No dysuria, frequency or urgency, or incontinence  MUSCULOSKELETAL: No unrelieved bone/joint pain NEUROLOGIC: No headache, dizziness  PSYCHIATRIC: No overt anxiety or sadness  Vitals:   10/23/19 1430  BP: (!) 144/82  Pulse: 84  Resp: 20  Temp: 98.7 F (37.1 C)  SpO2: 97%   Body mass index is 26.73 kg/m. Physical Exam  GENERAL APPEARANCE: Alert, conversant, No acute distress  SKIN: No diaphoresis rash HEENT: Unremarkable RESPIRATORY: Breathing is even, unlabored. Lung sounds are clear   CARDIOVASCULAR: Heart RRR no murmurs, rubs or gallops. No peripheral edema  GASTROINTESTINAL: Abdomen is soft, non-tender, not distended w/ normal bowel sounds.  GENITOURINARY: Bladder non tender, not distended  MUSCULOSKELETAL: No abnormal joints or musculature NEUROLOGIC: Cranial nerves 2-12 grossly intact. Moves all extremities PSYCHIATRIC: Mood and affect appropriate to situation, no behavioral issues  Patient Active Problem List   Diagnosis Date Noted  . COVID-19 10/15/2019  . Radiculopathy of cervical spine 09/09/2019  . Dementia without behavioral disturbance ([place]) 07/06/2019  . Dyslipidemia 06/24/2019  . Vascular dementia without behavioral disturbance ([place]) 06/24/2019  . Chronic gout without tophus 06/24/2019  . Physical deconditioning 06/24/2019  . Multiple falls 06/20/2019  . Arthritis of knee, left 01/03/2013  . Degenerative disc disease, lumbar 01/03/2013  . Coronary artery disease 09/13/2011  . Obstructive sleep apnea on CPAP 09/10/2007  . Essential hypertension 05/02/2007  . GERD 05/02/2007    CMP     Component Value Date/Time   NA 134 (L) 10/16/2019 0630   K  3.7 10/16/2019 0630   CL 100 10/16/2019 0630   CO2 26 10/16/2019 0630   GLUCOSE 96 10/16/2019 0630   BUN 14 10/16/2019 0630   CREATININE 0.47 (L) 10/16/2019 0630   CALCIUM 8.5 (L) 10/16/2019 0630   PROT 6.1 (L) 06/20/2019 0634   ALBUMIN 3.3 (L) 06/20/2019 0634   AST 14 (L) 06/20/2019 0634   ALT 15 06/20/2019 0634   ALKPHOS 74 06/20/2019 0634   BILITOT 0.7 06/20/2019 0634   GFRNONAA >60 10/16/2019 0630   GFRAA >60 10/16/2019 0630   Recent Labs    06/21/19 0556 10/05/19 1130 10/16/19 0630  NA 136 128* 134*  K 3.6 3.9 3.7  CL 105 97* 100  CO2 24 22 26   GLUCOSE 94 100* 96  BUN 19 18 14   CREATININE 0.65 0.53* 0.47*  CALCIUM 8.5* 8.6* 8.5*   Recent Labs    06/19/19 1504 06/20/19 0634  AST 17 14*  ALT 17 15  ALKPHOS 79 74  BILITOT 0.4 0.7  PROT 6.6 6.1*  ALBUMIN 3.4* 3.3*   Recent Labs    06/19/19 1504 06/20/19 0634 10/05/19 1130  WBC 8.4 7.7 4.8  NEUTROABS 5.5  --   --   HGB 14.2 13.1 11.8*  HCT 44.4 42.2 36.2*  MCV 93.7 94.8 89.8  PLT 317 306 256   No results for input(s): CHOL, LDLCALC, TRIG in the last 8760 hours.  Invalid input(s): HCL No results found for: MICROALBUR No results found for: TSH Lab Results  Component Value Date   HGBA1C 5.6 08/23/2019   Lab Results  Component Value Date   CHOL 113 09/02/2008   HDL 40 09/02/2008   LDLCALC 52 09/02/2008   TRIG 104 09/02/2008   CHOLHDL 2.8 Ratio 09/02/2008    Significant Diagnostic Results in last 30 days:  No results found.  Assessment and Plan  Weight loss-even though weight loss has been documented with a loss in October starting at 196.6  pounds with a BMI of 29 down to 181.2 pounds with BMI 26.76 at the end of December, review of patient's meals notes that he eats at least 50% of his meals and more often 75% of his meals.  Over the past 2 weeks patient's weight has stabilized.  Patient's BMI is more healthy now than it was prior.  However patient should not lose any more weight.  We will need  to add supplements.  We will continue to monitor   Time spent greater than 35 minutes;> 50% of time with patient was spent reviewing records, labs, tests and studies, counseling and developing plan of care  Hennie Duos, MD

## 2019-10-30 ENCOUNTER — Non-Acute Institutional Stay (SKILLED_NURSING_FACILITY): Payer: Medicare Other | Admitting: Internal Medicine

## 2019-10-30 DIAGNOSIS — M1A09X0 Idiopathic chronic gout, multiple sites, without tophus (tophi): Secondary | ICD-10-CM

## 2019-10-30 DIAGNOSIS — U071 COVID-19: Secondary | ICD-10-CM

## 2019-10-30 DIAGNOSIS — F0150 Vascular dementia without behavioral disturbance: Secondary | ICD-10-CM

## 2019-10-31 ENCOUNTER — Encounter: Payer: Self-pay | Admitting: Internal Medicine

## 2019-10-31 NOTE — Progress Notes (Signed)
Location:  [facility] Room Number: 127-D Place of Service:  SNF (31)  Hennie Duos, MD  Patient Care Team: Hennie Duos, MD as PCP - General (Internal Medicine) [name] [name], NP as Nurse Practitioner ([place], [place] ([place])  Extended Emergency Contact Information Primary Emergency Contact: [facility] Address: [address]          [place], [place] [address] [name] of [place] Phone: [phone] Work Phone: [phone] Relation: Spouse    Allergies: Penicillins, Sulfonamide derivatives, and Tetanus toxoid  Chief Complaint  Patient presents with  . Medical Management of Chronic Issues    Routine [place] visit    HPI: Patient is an 85 y.o. male who is being seen for routine issues of dementia, gout, and prior Covid.  Past Medical History:  Diagnosis Date  . Cancer (HCC)    melanoma-left side  . Chronic pain    legs and feet  . Coronary artery disease   . Dementia ([place])   . Diabetes mellitus without complication ([place])   . Foot drop   . GERD (gastroesophageal reflux disease)   . Gout   . Hyperlipidemia   . Hypertension   . OCD (obsessive compulsive disorder)   . PONV (postoperative nausea and vomiting)   . Psychosis ([place])   . Reflux   . Sarcoidosis   . Sleep apnea    cpap    Past Surgical History:  Procedure Laterality Date  . BACK SURGERY     cervical neck  . CATARACT EXTRACTION W/PHACO  01/05/2012   Procedure: CATARACT EXTRACTION PHACO AND INTRAOCULAR LENS PLACEMENT (IOC);  Surgeon: [name] Branch, MD;  Location: [facility];  Service: Ophthalmology;  Laterality: Left;  CDE 18.47  . CATARACT EXTRACTION W/PHACO  02/02/2012   Procedure: CATARACT EXTRACTION PHACO AND INTRAOCULAR LENS PLACEMENT (IOC);  Surgeon: [name] Branch, MD;  Location: [facility];  Service: Ophthalmology;  Laterality: Right;  CDE:15.38  . CHOLECYSTECTOMY    . GALLBLADDER SURGERY    .  hemorhoidectomy    . MELANOMA EXCISION     left side-[name]  . TONSILLECTOMY      Allergies as of 10/30/2019      Reactions   Penicillins Rash   Sulfonamide Derivatives Rash   Tetanus Toxoid Rash      Medication List    Notice   This visit is during an admission. Changes to the med list made in this visit will be reflected in the After Visit Summary of the admission.    Current Facility-Administered Medications on File Prior to Visit  Medication Dose Route Frequency Provider Last Rate Last Admin  . 0.9 %  sodium chloride infusion   Intravenous PRN Elsie Stain, MD      . albuterol (VENTOLIN HFA) 108 (90 Base) MCG/ACT inhaler 2 puff  2 puff Inhalation Once PRN Elsie Stain, MD      . bamlanivimab (EUA) 700 mg in sodium chloride 0.9 % 200 mL IVPB  700 mg Intravenous Once Elsie Stain, MD      . diphenhydrAMINE (BENADRYL) injection 50 mg  50 mg Intravenous Once PRN Elsie Stain, MD      . EPINEPHrine (EPI-PEN) injection 0.3 mg  0.3 mg Intramuscular Once PRN Elsie Stain, MD      . famotidine (PEPCID) 20 mg in sodium chloride 0.9 % 50 mL IVPB  20 mg Intravenous Once PRN Elsie Stain, MD      .  methylPREDNISolone sodium succinate (SOLU-MEDROL) 130 mg in sodium chloride 0.9 % 50 mL IVPB  130 mg Intravenous Once PRN Elsie Stain, MD       Current Outpatient Medications on File Prior to Visit  Medication Sig Dispense Refill  . acetaminophen (TYLENOL) 500 MG tablet Take 500 mg by mouth every 6 (six) hours as needed. For pain     . albuterol (VENTOLIN HFA) 108 (90 Base) MCG/ACT inhaler Inhale 2 puffs into the lungs every 4 (four) hours as needed for wheezing or shortness of breath.    . allopurinol (ZYLOPRIM) 300 MG tablet Take 300 mg by mouth daily.      [name] (NORVASC) 5 MG tablet Take 1 tablet (5 mg total) by mouth daily.    [name] EC 81 MG tablet Take 81 mg by mouth daily.    [name] (LIPITOR) 20 MG tablet Take 1 tablet (20 mg  total) by mouth daily. 30 tablet 11  . Balsam [place]-Castor Oil (VENELEX) OINT Apply to sacrum, coccyx, and bilateral buttocks each shift and prn     . donepezil (ARICEPT) 10 MG tablet Take 10 mg by mouth at bedtime.     . gabapentin (NEURONTIN) 100 MG capsule Take 100 mg by mouth at bedtime.    . Glucerna (GLUCERNA) LIQD Take 237 mLs by mouth 2 (two) times daily between meals.    [name] (ZESTRIL) 5 MG tablet Take 1 tablet (5 mg total) by mouth daily.    . meloxicam (MOBIC) 15 MG tablet Take 15 mg by mouth daily.     . NON FORMULARY Diet: Regular,NAS, Consistent Carbohydrate    . NON FORMULARY CPAP while sleeping. May use CPAP from home at previous home settings. Every Shift Day, Evening, Night    . NON FORMULARY Clean CPAP mask and tubing with baby shampoo and rinse well with warm water weekly and PRN Special Instructions: Clean weekly and PRN Once A Day on Tue 03:15 PM - 11:15 PM    . pantoprazole (PROTONIX) 40 MG tablet Take 1 tablet (40 mg total) by mouth daily.    . Vitamins A & D (VITAMIN A & D) ointment Apply A&D ointment to right foot and ankle qshift & prn for prevention. Every Shift       No orders of the defined types were placed in this encounter.   Immunization History  Administered Date(s) Administered  . Influenza Whole 08/15/2007  . Influenza-Unspecified 07/11/2019  . Pneumococcal Conjugate-13 08/05/2019    Social History   Tobacco Use  . Smoking status: Former Smoker    Packs/day: 0.25    Years: 20.00    Pack years: 5.00    Types: Pipe    Quit date: 01/02/1974    Years since quitting: 45.8  . Smokeless tobacco: Never Used  Substance Use Topics  . Alcohol use: No    Review of Systems  GENERAL:  no fevers, fatigue, appetite changes SKIN: No itching, rash HEENT: No complaint RESPIRATORY: No cough, wheezing, SOB CARDIAC: No chest pain, palpitations, lower extremity edema  GI: No abdominal pain, No N/V/D or constipation, No heartburn or reflux  GU:  No dysuria, frequency or urgency, or incontinence  MUSCULOSKELETAL: No unrelieved bone/joint pain NEUROLOGIC: No headache, dizziness  PSYCHIATRIC: No overt anxiety or sadness  Vitals:   10/31/19 1612  BP: 93/66  Pulse: 80  Resp: 20  Temp: (!) 97 F (36.1 C)  SpO2: 97%   Body mass index is 26.76 kg/m. Physical Exam  GENERAL APPEARANCE: Alert, conversant, No acute distress  SKIN: No diaphoresis rash HEENT: Unremarkable RESPIRATORY: Breathing is even, unlabored. Lung sounds are clear   CARDIOVASCULAR: Heart RRR no murmurs, rubs or gallops. No peripheral edema  GASTROINTESTINAL: Abdomen is soft, non-tender, not distended w/ normal bowel sounds.  GENITOURINARY: Bladder non tender, not distended  MUSCULOSKELETAL: No abnormal joints or musculature NEUROLOGIC: Cranial nerves 2-12 grossly intact. Moves all extremities PSYCHIATRIC: Mood and affect appropriate to situation, no behavioral issues  Patient Active Problem List   Diagnosis Date Noted  . Poliomyelitis osteopathy of lower leg, right ([place]) 11/01/2019  . COVID-19 10/15/2019  . Radiculopathy of cervical spine 09/09/2019  . Dementia without behavioral disturbance ([place]) 07/06/2019  . Dyslipidemia 06/24/2019  . Vascular dementia without behavioral disturbance ([place]) 06/24/2019  . Chronic gout without tophus 06/24/2019  . Physical deconditioning 06/24/2019  . Multiple falls 06/20/2019  . Arthritis of knee, left 01/03/2013  . Degenerative disc disease, lumbar 01/03/2013  . Coronary artery disease 09/13/2011  . Obstructive sleep apnea on CPAP 09/10/2007  . Essential hypertension 05/02/2007  . GERD 05/02/2007    CMP     Component Value Date/Time   NA 134 (L) 10/16/2019 0630   K 3.7 10/16/2019 0630   CL 100 10/16/2019 0630   CO2 26 10/16/2019 0630   GLUCOSE 96 10/16/2019 0630   BUN 14 10/16/2019 0630   CREATININE 0.47 (L) 10/16/2019 0630   CALCIUM 8.5 (L) 10/16/2019 0630   PROT 6.1 (L) 06/20/2019 0634   ALBUMIN 3.3 (L)  06/20/2019 0634   AST 14 (L) 06/20/2019 0634   ALT 15 06/20/2019 0634   ALKPHOS 74 06/20/2019 0634   BILITOT 0.7 06/20/2019 0634   GFRNONAA >60 10/16/2019 0630   GFRAA >60 10/16/2019 0630   Recent Labs    06/21/19 0556 10/05/19 1130 10/16/19 0630  NA 136 128* 134*  K 3.6 3.9 3.7  CL 105 97* 100  CO2 24 22 26   GLUCOSE 94 100* 96  BUN 19 18 14   CREATININE 0.65 0.53* 0.47*  CALCIUM 8.5* 8.6* 8.5*   Recent Labs    06/19/19 1504 06/20/19 0634  AST 17 14*  ALT 17 15  ALKPHOS 79 74  BILITOT 0.4 0.7  PROT 6.6 6.1*  ALBUMIN 3.4* 3.3*   Recent Labs    06/19/19 1504 06/20/19 0634 10/05/19 1130  WBC 8.4 7.7 4.8  NEUTROABS 5.5  --   --   HGB 14.2 13.1 11.8*  HCT 44.4 42.2 36.2*  MCV 93.7 94.8 89.8  PLT 317 306 256   No results for input(s): CHOL, LDLCALC, TRIG in the last 8760 hours.  Invalid input(s): HCL No results found for: MICROALBUR No results found for: TSH Lab Results  Component Value Date   HGBA1C 5.6 08/23/2019   Lab Results  Component Value Date   CHOL 113 09/02/2008   HDL 40 09/02/2008   LDLCALC 52 09/02/2008   TRIG 104 09/02/2008   CHOLHDL 2.8 Ratio 09/02/2008    Significant Diagnostic Results in last 30 days:  No results found.  Assessment and Plan  Vascular dementia without behavioral disturbance (HCC) Chronic and stable; continue Aricept 10 mg daily  Chronic gout without tophus No recent flares reported; continue allopurinol 300 mg daily  COVID-19 Patient with positive PCR test; patient received bamlanivimab infusion in SNF; patient has had no sequelae and disease appears to be resolved; continue to monitor     Hennie Duos, MD

## 2019-11-01 DIAGNOSIS — B91 Sequelae of poliomyelitis: Secondary | ICD-10-CM | POA: Insufficient documentation

## 2019-11-03 ENCOUNTER — Encounter: Payer: Self-pay | Admitting: Internal Medicine

## 2019-11-03 NOTE — Assessment & Plan Note (Signed)
No recent flares reported; continue allopurinol 300 mg daily

## 2019-11-03 NOTE — Assessment & Plan Note (Addendum)
Patient with positive PCR test; patient received bamlanivimab infusion in SNF; patient has had no sequelae and disease appears to be resolved; continue to monitor

## 2019-11-03 NOTE — Assessment & Plan Note (Signed)
Chronic and stable; continue Aricept 10 mg daily

## 2019-11-28 ENCOUNTER — Non-Acute Institutional Stay (SKILLED_NURSING_FACILITY): Payer: Medicare Other | Admitting: Internal Medicine

## 2019-11-28 DIAGNOSIS — M5136 Other intervertebral disc degeneration, lumbar region: Secondary | ICD-10-CM

## 2019-11-28 DIAGNOSIS — M1A09X0 Idiopathic chronic gout, multiple sites, without tophus (tophi): Secondary | ICD-10-CM | POA: Diagnosis not present

## 2019-11-28 DIAGNOSIS — I10 Essential (primary) hypertension: Secondary | ICD-10-CM

## 2019-11-28 DIAGNOSIS — G301 Alzheimer's disease with late onset: Secondary | ICD-10-CM

## 2019-11-28 DIAGNOSIS — U071 COVID-19: Secondary | ICD-10-CM | POA: Diagnosis not present

## 2019-11-28 DIAGNOSIS — F0280 Dementia in other diseases classified elsewhere without behavioral disturbance: Secondary | ICD-10-CM

## 2019-11-30 ENCOUNTER — Encounter: Payer: Self-pay | Admitting: Internal Medicine

## 2019-11-30 NOTE — Progress Notes (Signed)
This is a routine visit.  Level of care is skilled.  Facility is [facility].  Chief complaint routine visit for medical management of chronic medical conditions including dementia-gout-recent COVID-19 infection-hypertension-degenerative disc disease-weight loss-  History of present illness.  Patient is a pleasant 86 year old male with the above conditions.  Nursing does not report any recent acute issues.  He has had some weight loss and supplements have been added.  Appears he eats 76 to 100% of his meals most of the time per chart review  He does not report any abdominal discomfort or dysphagia.  He does have a history of dementia and continues on Aricept 10 mg a day.  He also was recently diagnosed with COVID-19 infection and received monoclonal antibodies and apparently did quite well-he appears to be back at his baseline and back in his room.  This may explain at least part of his weight loss as well.  Regards to hypertension this appears stable on lisinopril 5 mg a day and Norvasc 5 mg a day recent blood pressures 127/73-147/77-136/76-and 100/59.  He does have a history of degenerative disc disease and has had complaints of numbness apparently neurology consult has been ordered he is on Mobic 15 mg a day as well as Neurontin 100 mg at night.  Currently he is lying in bed comfortably he continues to be pleasant and appropriate   Past Medical History:  Diagnosis Date  . Cancer (HCC)    melanoma-left side  . Chronic pain    legs and feet  . Coronary artery disease   . Dementia ([place])   . Diabetes mellitus without complication ([place])   . Foot drop   . GERD (gastroesophageal reflux disease)   . Gout   . Hyperlipidemia   . Hypertension   . OCD (obsessive compulsive disorder)   . PONV (postoperative nausea and vomiting)   . Psychosis ([place])   . Reflux   . Sarcoidosis   . Sleep apnea    cpap         Past Surgical History:  Procedure  Laterality Date  . BACK SURGERY     cervical neck  . CATARACT EXTRACTION W/PHACO  01/05/2012   Procedure: CATARACT EXTRACTION PHACO AND INTRAOCULAR LENS PLACEMENT (IOC);  Surgeon: [name] Branch, MD;  Location: [facility];  Service: Ophthalmology;  Laterality: Left;  CDE 18.47  . CATARACT EXTRACTION W/PHACO  02/02/2012   Procedure: CATARACT EXTRACTION PHACO AND INTRAOCULAR LENS PLACEMENT (IOC);  Surgeon: [name] Branch, MD;  Location: [facility];  Service: Ophthalmology;  Laterality: Right;  CDE:15.38  . CHOLECYSTECTOMY    . GALLBLADDER SURGERY    . hemorhoidectomy    . MELANOMA EXCISION     left side-[name]  . TONSILLECTOMY           Allergies as of 10/30/2019      Reactions   Penicillins Rash   Sulfonamide Derivatives Rash   Tetanus Toxoid Rash      Medication List  Current Outpatient Medications on File Prior to Visit  Medication Sig Dispense Refill  . acetaminophen (TYLENOL) 500 MG tablet Take 500 mg by mouth every 6 (six) hours as needed. For pain     . albuterol (VENTOLIN HFA) 108 (90 Base) MCG/ACT inhaler Inhale 2 puffs into the lungs every 4 (four) hours as needed for wheezing or shortness of breath.    . allopurinol (ZYLOPRIM) 300 MG tablet Take 300 mg by mouth daily.      [name] (NORVASC) 5 MG tablet Take 1 tablet (5 mg total) by mouth daily.    [name] EC 81 MG tablet Take 81 mg by mouth daily.    [name] (LIPITOR) 20 MG tablet Take 1 tablet (20 mg total) by mouth daily. 30 tablet 11  . Balsam [place]-Castor Oil (VENELEX) OINT Apply to sacrum, coccyx, and bilateral buttocks each shift and prn     . donepezil (ARICEPT) 10 MG tablet Take 10 mg by mouth at bedtime.     . gabapentin (NEURONTIN) 100 MG capsule Take 100 mg by mouth at bedtime.    . Glucerna (GLUCERNA) LIQD Take 237 mLs by mouth 2 (two) times daily between  meals.    [name] (ZESTRIL) 5 MG tablet Take 1 tablet (5 mg total) by mouth daily.    . meloxicam (MOBIC) 15 MG tablet Take 15 mg by mouth daily.     . NON FORMULARY Diet: Regular,NAS, Consistent Carbohydrate    . NON FORMULARY CPAP while sleeping. May use CPAP from home at previous home settings. Every Shift Day, Evening, Night    . NON FORMULARY Clean CPAP mask and tubing with baby shampoo and rinse well with warm water weekly and PRN Special Instructions: Clean weekly and PRN Once A Day on Tue 03:15 PM - 11:15 PM    . pantoprazole (PROTONIX) 40 MG tablet Take 1 tablet (40 mg total) by mouth daily.    . Vitamins A & D (VITAMIN A & D) ointment Apply A&D ointment to right foot and ankle qshift & prn for prevention. Every Shift               Immunization History  Administered Date(s) Administered  . Influenza Whole 08/15/2007  . Influenza-Unspecified 07/11/2019  . Pneumococcal Conjugate-13 08/05/2019    Social History        Tobacco Use  . Smoking status: Former Smoker    Packs/day: 0.25    Years: 20.00    Pack years: 5.00    Types: Pipe    Quit date: 01/02/1974    Years since quitting: 45.8  . Smokeless tobacco: Never Used  Substance Use Topics  . Alcohol use: No    Review of systems.  General no complaints of fever or chills.  Skin does not complain of rashes or itching or diaphoresis.  Head ears eyes nose mouth and throat is not complaining of visual changes or sore throat.  Respiratory is not complaining of any shortness of breath or having a cough.  Cardiac does not complain of chest pain or palpitations or increased edema.  GI is not complaining of abdominal pain nausea vomiting diarrhea constipation.  GU no complaints of dysuria.  Musculoskeletal is not complaining of joint pain.  Neurologic does not complain of dizziness headache or syncope.  And psych does not complain of being depressed or anxious    Physical exam.  Temperature is 98.4 pulse 71 respirations 20 blood pressure 127/73.  In general this is a pleasant  elderly male in no distress resting comfortably in bed.  His skin is warm and dry.  Eyes visual acuity appears to be intact sclera and conjunctive are clear.  Oropharynx is clear mucous membranes moist.  Chest is clear to auscultation there is no labored breathing.  Heart is regular rate and rhythm without murmur gallop or rub he does not have significant lower extremity edema.  Abdomen soft nontender with positive bowel sounds.  Musculoskeletal does h has decreased range of motion of his shoulders bilaterally.  Limited exam secondary to being in bed but appears able to move all his extremities  Neurologic he is alert could not really appreciate lateralizing findings.  Psych he ispleasant and appropriate  Labs.  October 16, 2019.  Sodium 134 potassium 3.7 BUN 14 creatinine 0.47.  October 05, 2019.  WBC 4.8 hemoglobin 11.8 platelets 256.  Assessment and plan.  1.  Dementia-at this point continue supportive care he appears to be doing well with supportive care-she has had some weight loss but appears to have a good appetite I suspect recent Covid diagnosis may of contributed to recent weight loss this will need continued monitoring..  2.  History of COVID-19 infection-he appears to have made a nice recovery from this he did receive monoclonal antibodies-.  3.  History of hypertension as noted above this appears somewhat variable but generally stable he is on lisinopril 5 mg a day and Norvasc 5 mg a day.  4.  History of degenerative disc disease with history of numbness apparently neurology consult is pending suspect Covid quarantine has affected this somewhat.  He is on Mobic as well as Neurontin.  5.  History of gout he is on allopurinol 300 mg daily this appears stabilized.  Of note will update labs including a CBC and BMP first laboratory day next week  to keep an eye on his sodium it was 134 on lab last month also will check his hemoglobin which was recently 11.8   [id number]

## 2019-12-02 ENCOUNTER — Encounter (HOSPITAL_COMMUNITY)
Admission: RE | Admit: 2019-12-02 | Discharge: 2019-12-02 | Disposition: A | Discharge status: Home / Self Care | Payer: Medicare Other | Source: Ambulatory Visit | Attending: Internal Medicine | Admitting: Internal Medicine

## 2019-12-02 DIAGNOSIS — U071 COVID-19: Secondary | ICD-10-CM | POA: Diagnosis not present

## 2019-12-02 DIAGNOSIS — E119 Type 2 diabetes mellitus without complications: Secondary | ICD-10-CM | POA: Diagnosis not present

## 2019-12-02 DIAGNOSIS — I10 Essential (primary) hypertension: Secondary | ICD-10-CM | POA: Insufficient documentation

## 2019-12-02 LAB — CBC
HCT: 38.9 % — ABNORMAL LOW (ref 39.0–52.0)
Hemoglobin: 12.4 g/dL — ABNORMAL LOW (ref 13.0–17.0)
MCH: 29 pg (ref 26.0–34.0)
MCHC: 31.9 g/dL (ref 30.0–36.0)
MCV: 91.1 fL (ref 80.0–100.0)
Platelets: 227 10*3/uL (ref 150–400)
RBC: 4.27 MIL/uL (ref 4.22–5.81)
RDW: 15.1 % (ref 11.5–15.5)
WBC: 5.9 10*3/uL (ref 4.0–10.5)
nRBC: 0 % (ref 0.0–0.2)

## 2019-12-02 LAB — BASIC METABOLIC PANEL
Anion gap: 8 (ref 5–15)
BUN: 19 mg/dL (ref 8–23)
CO2: 25 mmol/L (ref 22–32)
Calcium: 8.7 mg/dL — ABNORMAL LOW (ref 8.9–10.3)
Chloride: 100 mmol/L (ref 98–111)
Creatinine, Ser: 0.42 mg/dL — ABNORMAL LOW (ref 0.61–1.24)
GFR calc Af Amer: >60 mL/min (ref >60)
GFR calc non Af Amer: >60 mL/min (ref >60)
Glucose, Bld: 87 mg/dL (ref 70–99)
Potassium: 3.9 mmol/L (ref 3.5–5.1)
Sodium: 133 mmol/L — ABNORMAL LOW (ref 135–145)

## 2019-12-13 ENCOUNTER — Non-Acute Institutional Stay (SKILLED_NURSING_FACILITY): Payer: Medicare Other | Admitting: Adult Health

## 2019-12-13 ENCOUNTER — Encounter: Payer: Self-pay | Admitting: Adult Health

## 2019-12-13 DIAGNOSIS — R634 Abnormal weight loss: Secondary | ICD-10-CM | POA: Diagnosis not present

## 2019-12-13 NOTE — Progress Notes (Signed)
Location:    [place] Room Number: 127D Place of Service:  SNF (31) Phillips Grout NP    CODE STATUS: DNR  Allergies  Allergen Reactions  . Penicillins Rash  . Sulfonamide Derivatives Rash  . Tetanus Toxoid Rash    Chief Complaint  Patient presents with  . Acute Visit    Weight Loss    HPI:  He is losing weight since his admission to this facility. His admission weight was 207 pounds. He has had covid which greatly affected his appetite. His weight 10-29-19 181 pounds on 12-04-19 weight 178 pounds. He is getting his appetite back he does continue to have weakness present. There are no reports of fevers.   Past Medical History:  Diagnosis Date  . Cancer (HCC)    melanoma-left side  . Chronic pain    legs and feet  . Coronary artery disease   . Dementia ([place])   . Diabetes mellitus without complication ([place])   . Foot drop   . GERD (gastroesophageal reflux disease)   . Gout   . Hyperlipidemia   . Hypertension   . OCD (obsessive compulsive disorder)   . PONV (postoperative nausea and vomiting)   . Psychosis ([place])   . Reflux   . Sarcoidosis   . Sleep apnea    cpap    Past Surgical History:  Procedure Laterality Date  . BACK SURGERY     cervical neck  . CATARACT EXTRACTION W/PHACO  01/05/2012   Procedure: CATARACT EXTRACTION PHACO AND INTRAOCULAR LENS PLACEMENT (IOC);  Surgeon: [name] Branch, MD;  Location: [facility];  Service: Ophthalmology;  Laterality: Left;  CDE 18.47  . CATARACT EXTRACTION W/PHACO  02/02/2012   Procedure: CATARACT EXTRACTION PHACO AND INTRAOCULAR LENS PLACEMENT (IOC);  Surgeon: [name] Branch, MD;  Location: [facility];  Service: Ophthalmology;  Laterality: Right;  CDE:15.38  . CHOLECYSTECTOMY    . GALLBLADDER SURGERY    . hemorhoidectomy    . MELANOMA EXCISION     left side-[name]  . TONSILLECTOMY      Social History   Socioeconomic History  . Marital status: Married    Spouse name: Not on file  . Number of children: Not on  file  . Years of education: Not on file  . Highest education level: Not on file  Occupational History  . Occupation: retired   Tobacco Use  . Smoking status: Former Smoker    Packs/day: 0.25    Years: 20.00    Pack years: 5.00    Types: Pipe    Quit date: 01/02/1974    Years since quitting: 45.9  . Smokeless tobacco: Never Used  Substance and Sexual Activity  . Alcohol use: No  . Drug use: No  . Sexual activity: Not Currently  Other Topics Concern  . Not on file  Social History Narrative   Former pipe smoker, quit many years ago.   Long term resident of [facility]      Social Determinants of Health   Financial Resource Strain: Low Risk   . Difficulty of Paying Living Expenses: Not hard at all  Food Insecurity: No Food Insecurity  . Worried About Charity fundraiser in the Last Year: Never true  . Ran Out of Food in the Last Year: Never true  Transportation Needs: No Transportation Needs  . Lack of Transportation (Medical): No  . Lack of Transportation (Non-Medical): No  Physical Activity: Inactive  . Days of Exercise per Week: 0 days  .  Minutes of Exercise per Session: 0 min  Stress: No Stress Concern Present  . Feeling of Stress : Not at all  Social Connections: Moderately Isolated  . Frequency of Communication with Friends and Family: Never  . Frequency of Social Gatherings with Friends and Family: Never  . Attends Religious Services: Never  . Active Member of Clubs or Organizations: No  . Attends Archivist Meetings: Not asked  . Marital Status: Married  Human resources officer Violence: Not At Risk  . Fear of Current or Ex-Partner: No  . Emotionally Abused: No  . Physically Abused: No  . Sexually Abused: No   Family History  Problem Relation Age of Onset  . Heart disease Other   . Arthritis Other   . Cancer Other   . Anesthesia problems Neg Hx   . Hypotension Neg Hx   . Malignant hyperthermia Neg Hx   . Pseudochol deficiency Neg Hx       VITAL SIGNS BP  105/64   Pulse 64   Temp 98 F (36.7 C) (Oral)   Resp 20   Ht 5\' 9"  (1.753 m)   Wt 178 lb (80.7 kg)   SpO2 97%   BMI 26.29 kg/m   Facility-Administered Encounter Medications as of 12/13/2019  Medication  . 0.9 %  sodium chloride infusion  . albuterol (VENTOLIN HFA) 108 (90 Base) MCG/ACT inhaler 2 puff  . bamlanivimab (EUA) 700 mg in sodium chloride 0.9 % 200 mL IVPB  . diphenhydrAMINE (BENADRYL) injection 50 mg  . EPINEPHrine (EPI-PEN) injection 0.3 mg  . famotidine (PEPCID) 20 mg in sodium chloride 0.9 % 50 mL IVPB  . methylPREDNISolone sodium succinate (SOLU-MEDROL) 130 mg in sodium chloride 0.9 % 50 mL IVPB   Outpatient Encounter Medications as of 12/13/2019  Medication Sig  . acetaminophen (TYLENOL) 500 MG tablet Take 500 mg by mouth every 6 (six) hours as needed. For pain   . albuterol (VENTOLIN HFA) 108 (90 Base) MCG/ACT inhaler Inhale 2 puffs into the lungs every 4 (four) hours as needed for wheezing or shortness of breath.  . allopurinol (ZYLOPRIM) 300 MG tablet Take 300 mg by mouth daily.    [name] (NORVASC) 5 MG tablet Take 1 tablet (5 mg total) by mouth daily.  [name] EC 81 MG tablet Take 81 mg by mouth daily.  [name] (LIPITOR) 20 MG tablet Take 1 tablet (20 mg total) by mouth daily.  Roseanne Kaufman Peru-Castor Oil (VENELEX) OINT Apply to sacrum, coccyx, and bilateral buttocks each shift and prn   . donepezil (ARICEPT) 10 MG tablet Take 10 mg by mouth at bedtime.   . gabapentin (NEURONTIN) 100 MG capsule Take 100 mg by mouth at bedtime.  . Glucerna (GLUCERNA) LIQD Take 237 mLs by mouth 2 (two) times daily between meals.  [name] (ZESTRIL) 5 MG tablet Take 1 tablet (5 mg total) by mouth daily.  . meloxicam (MOBIC) 15 MG tablet Take 15 mg by mouth daily.   . NON FORMULARY Diet: Regular,NAS, Consistent Carbohydrate  . NON FORMULARY CPAP while sleeping. May use CPAP from home at previous home settings. Every Shift Day, Evening, Night  . NON FORMULARY Clean  CPAP mask and tubing with baby shampoo and rinse well with warm water weekly and PRN Special Instructions: Clean weekly and PRN Once A Day on Tue 03:15 PM - 11:15 PM  . pantoprazole (PROTONIX) 40 MG tablet Take 1 tablet (40 mg total) by mouth daily.  . Vitamins A & D (  VITAMIN A & D) ointment Apply A&D ointment to right foot and ankle qshift & prn for prevention. Every Shift     SIGNIFICANT DIAGNOSTIC EXAMS   PREVIOUS;   06-19-19: chest x-[place]: Minimal right basilar subsegmental atelectasis   06-19-19: ct of head: Mild diffuse cortical atrophy. No acute intracranial abnormality seen.  06-19-19: ct of lumbar spine:  1. No acute/traumatic lumbar spine pathology. 2. Osteopenia with extensive multilevel degenerative changes of the spine. 3. Lumbar levoscoliosis and multilevel disc bulge and neural foraminal narrowing. MRI may provide better evaluation. Aortic Atherosclerosis   06-22-19: lumbar spine x-[place]: 1. Degenerative changes. 2. No evidence for acute abnormality  NO NEW EXAMS.   LABS REVIEWED PREVIOUS;   06-19-19: wbc 8.4; hgb 14.2; hct 44.4; mcv 93.7 plt 317; glucose 106; bun 26; creat 0.82; k+ 4.1; na++ 138; ca 8.8; liver normal albumin 3.4; urine culture no growth 06-20-19-: wbc 7.7; hgb 13.1 hct 42.2; mcv 94.8; plt 306; glucose 108; bun 24; creat 0.80; k+ 3.9; na++ 138; ca 8.8 ;liver normal albumin 3.3  08-23-19: hgb a1c 5.6  10-05-19: wbc 4.8; hgb 11.8; hct 36.2; mcv 89.8 plt 256; glucose 100; bun 18; creat 0.53 ;k+ 3.9; an++ 128; ca 8.6; d-dimer: 3.36 ferritin 965  NO NEW LABS.   Review of Systems  Constitutional: Negative for malaise/fatigue.  Respiratory: Negative for cough and shortness of breath.   Cardiovascular: Negative for chest pain, palpitations and leg swelling.  Gastrointestinal: Negative for abdominal pain, constipation and heartburn.  Musculoskeletal: Negative for back pain, joint pain and myalgias.  Skin: Negative.   Neurological: Negative for dizziness.   Psychiatric/Behavioral: The patient is not nervous/anxious.     Physical Exam Constitutional:      General: He is not in acute distress.    Appearance: He is well-developed. He is not diaphoretic.  Eyes:     Comments: History of bilateral cataract with lens implants    Neck:     Thyroid: No thyromegaly.  Cardiovascular:     Rate and Rhythm: Normal rate and regular rhythm.     Pulses: Normal pulses.     Heart sounds: Normal heart sounds.  Pulmonary:     Effort: Pulmonary effort is normal. No respiratory distress.     Breath sounds: Normal breath sounds.  Abdominal:     General: Bowel sounds are normal. There is no distension.     Palpations: Abdomen is soft.     Tenderness: There is no abdominal tenderness.  Musculoskeletal:     Cervical back: Neck supple.     Right lower leg: No edema.     Left lower leg: No edema.     Comments:  History of cervical spine surgery Is able to move all extremities Has chronic limited ROM to neck Has right fingers contractures Decreased ROM to bilateral shoulders Increased difficulty with grip       Lymphadenopathy:     Cervical: No cervical adenopathy.  Skin:    General: Skin is warm and dry.  Neurological:     Mental Status: He is alert. Mental status is at baseline.  Psychiatric:        Mood and Affect: Mood normal.       ASSESSMENT/ PLAN:  TODAY  1. Weight loss  More than likely related to covid His appetite is returning He continues with weakness: will setup ct scan Will not make any medication changes at this time Will monitor his status.   MD is aware of resident's narcotic use and  is in agreement with current plan of care. We will attempt to wean resident as appropriate.  Ok Edwards NP [facility]  Contact [phone] Monday through Friday 8am- 5pm  After hours call [phone]

## 2019-12-17 ENCOUNTER — Ambulatory Visit (HOSPITAL_COMMUNITY)
Admission: RE | Admit: 2019-12-17 | Discharge: 2019-12-17 | Disposition: A | Discharge status: Home / Self Care | Payer: Medicare Other | Source: Ambulatory Visit | Attending: Adult Health | Admitting: Adult Health

## 2019-12-17 DIAGNOSIS — M2578 Osteophyte, vertebrae: Secondary | ICD-10-CM | POA: Insufficient documentation

## 2019-12-17 DIAGNOSIS — R9082 White matter disease, unspecified: Secondary | ICD-10-CM | POA: Diagnosis not present

## 2019-12-17 DIAGNOSIS — R531 Weakness: Secondary | ICD-10-CM | POA: Diagnosis not present

## 2019-12-17 DIAGNOSIS — M4322 Fusion of spine, cervical region: Secondary | ICD-10-CM | POA: Diagnosis not present

## 2019-12-17 DIAGNOSIS — Z981 Arthrodesis status: Secondary | ICD-10-CM | POA: Insufficient documentation

## 2019-12-17 DIAGNOSIS — R634 Abnormal weight loss: Secondary | ICD-10-CM | POA: Insufficient documentation

## 2019-12-27 ENCOUNTER — Encounter: Payer: Self-pay | Admitting: Adult Health

## 2019-12-27 ENCOUNTER — Non-Acute Institutional Stay (SKILLED_NURSING_FACILITY): Payer: Medicare Other | Admitting: Adult Health

## 2019-12-27 DIAGNOSIS — I10 Essential (primary) hypertension: Secondary | ICD-10-CM

## 2019-12-27 DIAGNOSIS — Z9989 Dependence on other enabling machines and devices: Secondary | ICD-10-CM | POA: Diagnosis not present

## 2019-12-27 DIAGNOSIS — K219 Gastro-esophageal reflux disease without esophagitis: Secondary | ICD-10-CM

## 2019-12-27 DIAGNOSIS — G4733 Obstructive sleep apnea (adult) (pediatric): Secondary | ICD-10-CM | POA: Diagnosis not present

## 2019-12-27 NOTE — Progress Notes (Signed)
Location:    [facility] Room Number: 127D Place of Service:  SNF (31) Phillips Grout NP    CODE STATUS: DNR  Allergies  Allergen Reactions  . Penicillins Rash  . Sulfonamide Derivatives Rash  . Tetanus Toxoid Rash    Chief Complaint  Patient presents with  . Medical Management of Chronic Issues       Essential hypertension: Sleep apnea; obstructive:  GERD without esophagitis:    HPI:  He is a 86 year old long term resident of this facility being seen for the management of his chronic illnesses: hypertension; sleep apnea; gerd. There are no reports of uncontrolled pain; no changes in appetite; no reports of anxiety or agitation.   Past Medical History:  Diagnosis Date  . Cancer (HCC)    melanoma-left side  . Chronic pain    legs and feet  . Coronary artery disease   . Dementia ([place])   . Diabetes mellitus without complication ([place])   . Foot drop   . GERD (gastroesophageal reflux disease)   . Gout   . Hyperlipidemia   . Hypertension   . OCD (obsessive compulsive disorder)   . PONV (postoperative nausea and vomiting)   . Psychosis ([place])   . Reflux   . Sarcoidosis   . Sleep apnea    cpap    Past Surgical History:  Procedure Laterality Date  . BACK SURGERY     cervical neck  . CATARACT EXTRACTION W/PHACO  01/05/2012   Procedure: CATARACT EXTRACTION PHACO AND INTRAOCULAR LENS PLACEMENT (IOC);  Surgeon: [name] Branch, MD;  Location: [facility];  Service: Ophthalmology;  Laterality: Left;  CDE 18.47  . CATARACT EXTRACTION W/PHACO  02/02/2012   Procedure: CATARACT EXTRACTION PHACO AND INTRAOCULAR LENS PLACEMENT (IOC);  Surgeon: [name] Branch, MD;  Location: [facility];  Service: Ophthalmology;  Laterality: Right;  CDE:15.38  . CHOLECYSTECTOMY    . GALLBLADDER SURGERY    . hemorhoidectomy    . MELANOMA EXCISION     left side-[name]  . TONSILLECTOMY      Social History   Socioeconomic History  . Marital status: Married    Spouse name: Not on file  .  Number of children: Not on file  . Years of education: Not on file  . Highest education level: Not on file  Occupational History  . Occupation: retired   Tobacco Use  . Smoking status: Former Smoker    Packs/day: 0.25    Years: 20.00    Pack years: 5.00    Types: Pipe    Quit date: 01/02/1974    Years since quitting: 46.0  . Smokeless tobacco: Never Used  Substance and Sexual Activity  . Alcohol use: No  . Drug use: No  . Sexual activity: Not Currently  Other Topics Concern  . Not on file  Social History Narrative   Former pipe smoker, quit many years ago.   Long term resident of [facility]      Social Determinants of Health   Financial Resource Strain: Low Risk   . Difficulty of Paying Living Expenses: Not hard at all  Food Insecurity: No Food Insecurity  . Worried About Charity fundraiser in the Last Year: Never true  . Ran Out of Food in the Last Year: Never true  Transportation Needs: No Transportation Needs  . Lack of Transportation (Medical): No  . Lack of Transportation (Non-Medical): No  Physical Activity: Inactive  . Days of Exercise per Week: 0 days  .  Minutes of Exercise per Session: 0 min  Stress: No Stress Concern Present  . Feeling of Stress : Not at all  Social Connections: Moderately Isolated  . Frequency of Communication with Friends and Family: Never  . Frequency of Social Gatherings with Friends and Family: Never  . Attends Religious Services: Never  . Active Member of Clubs or Organizations: No  . Attends Archivist Meetings: Not asked  . Marital Status: Married  Human resources officer Violence: Not At Risk  . Fear of Current or Ex-Partner: No  . Emotionally Abused: No  . Physically Abused: No  . Sexually Abused: No   Family History  Problem Relation Age of Onset  . Heart disease Other   . Arthritis Other   . Cancer Other   . Anesthesia problems Neg Hx   . Hypotension Neg Hx   . Malignant hyperthermia Neg Hx   . Pseudochol deficiency Neg Hx        VITAL SIGNS BP 105/64   Pulse 64   Temp (!) 97.3 F (36.3 C) (Oral)   Resp 20   Ht 5\' 9"  (1.753 m)   Wt 178 lb (80.7 kg)   SpO2 97%   BMI 26.29 kg/m   Facility-Administered Encounter Medications as of 12/27/2019  Medication  . 0.9 %  sodium chloride infusion  . albuterol (VENTOLIN HFA) 108 (90 Base) MCG/ACT inhaler 2 puff  . bamlanivimab (EUA) 700 mg in sodium chloride 0.9 % 200 mL IVPB  . diphenhydrAMINE (BENADRYL) injection 50 mg  . EPINEPHrine (EPI-PEN) injection 0.3 mg  . famotidine (PEPCID) 20 mg in sodium chloride 0.9 % 50 mL IVPB  . methylPREDNISolone sodium succinate (SOLU-MEDROL) 130 mg in sodium chloride 0.9 % 50 mL IVPB   Outpatient Encounter Medications as of 12/27/2019  Medication Sig  . acetaminophen (TYLENOL) 500 MG tablet Take 500 mg by mouth every 6 (six) hours as needed. For pain   . albuterol (VENTOLIN HFA) 108 (90 Base) MCG/ACT inhaler Inhale 2 puffs into the lungs every 4 (four) hours as needed for wheezing or shortness of breath.  . allopurinol (ZYLOPRIM) 300 MG tablet Take 300 mg by mouth daily.    [name] (NORVASC) 5 MG tablet Take 1 tablet (5 mg total) by mouth daily.  [name] EC 81 MG tablet Take 81 mg by mouth daily.  [name] EC 81 MG tablet Take 81 mg by mouth daily.  [name] (LIPITOR) 20 MG tablet Take 1 tablet (20 mg total) by mouth daily.  Roseanne Kaufman [place]-Castor Oil (VENELEX) OINT Apply to sacrum, coccyx, and bilateral buttocks each shift and prn   . donepezil (ARICEPT) 10 MG tablet Take 10 mg by mouth at bedtime.   . gabapentin (NEURONTIN) 100 MG capsule Take 100 mg by mouth at bedtime.  . Glucerna (GLUCERNA) LIQD Take 237 mLs by mouth 2 (two) times daily between meals.  [name] (ZESTRIL) 5 MG tablet Take 1 tablet (5 mg total) by mouth daily.  . meloxicam (MOBIC) 15 MG tablet Take 15 mg by mouth daily.   . NON FORMULARY Diet: Regular,NAS, Consistent Carbohydrate  . NON FORMULARY CPAP while sleeping. May use CPAP from  home at previous home settings. Every Shift Day, Evening, Night  . NON FORMULARY Clean CPAP mask and tubing with baby shampoo and rinse well with warm water weekly and PRN Special Instructions: Clean weekly and PRN Once A Day on Tue 03:15 PM - 11:15 PM  . pantoprazole (PROTONIX) 40 MG tablet Take  1 tablet (40 mg total) by mouth daily.  . Vitamins A & D (VITAMIN A & D) ointment Apply A&D ointment to right foot and ankle qshift & prn for prevention. Every Shift     SIGNIFICANT DIAGNOSTIC EXAMS   PREVIOUS;   06-19-19: chest x-[place]: Minimal right basilar subsegmental atelectasis   06-19-19: ct of head: Mild diffuse cortical atrophy. No acute intracranial abnormality seen.  06-19-19: ct of lumbar spine:  1. No acute/traumatic lumbar spine pathology. 2. Osteopenia with extensive multilevel degenerative changes of the spine. 3. Lumbar levoscoliosis and multilevel disc bulge and neural foraminal narrowing. MRI may provide better evaluation. Aortic Atherosclerosis   06-22-19: lumbar spine x-[place]: 1. Degenerative changes. 2. No evidence for acute abnormality  NO NEW EXAMS.   LABS REVIEWED PREVIOUS;   06-19-19: wbc 8.4; hgb 14.2; hct 44.4; mcv 93.7 plt 317; glucose 106; bun 26; creat 0.82; k+ 4.1; na++ 138; ca 8.8; liver normal albumin 3.4; urine culture no growth 06-20-19-: wbc 7.7; hgb 13.1 hct 42.2; mcv 94.8; plt 306; glucose 108; bun 24; creat 0.80; k+ 3.9; na++ 138; ca 8.8 ;liver normal albumin 3.3  08-23-19: hgb a1c 5.6  10-05-19: wbc 4.8; hgb 11.8; hct 36.2; mcv 89.8 plt 256; glucose 100; bun 18; creat 0.53 ;k+ 3.9; an++ 128; ca 8.6; d-dimer: 3.36 ferritin 965  NO NEW LABS.   Review of Systems  Constitutional: Negative for malaise/fatigue.  Respiratory: Negative for cough and shortness of breath.   Cardiovascular: Negative for chest pain, palpitations and leg swelling.  Gastrointestinal: Negative for abdominal pain, constipation and heartburn.  Musculoskeletal: Negative for back  pain, joint pain and myalgias.  Skin: Negative.   Neurological: Negative for dizziness.  Psychiatric/Behavioral: The patient is not nervous/anxious.     Physical Exam Constitutional:      General: He is not in acute distress.    Appearance: He is well-developed. He is not diaphoretic.  Eyes:     Comments: History of bilateral cataract with lens implants     Neck:     Thyroid: No thyromegaly.  Cardiovascular:     Rate and Rhythm: Normal rate and regular rhythm.     Pulses: Normal pulses.     Heart sounds: Normal heart sounds.  Pulmonary:     Effort: Pulmonary effort is normal. No respiratory distress.     Breath sounds: Normal breath sounds.  Abdominal:     General: Bowel sounds are normal. There is no distension.     Palpations: Abdomen is soft.     Tenderness: There is no abdominal tenderness.  Musculoskeletal:     Cervical back: Neck supple.     Right lower leg: No edema.     Left lower leg: No edema.     Comments: History of cervical spine surgery Is able to move all extremities Has chronic limited ROM to neck Has right fingers contractures Decreased ROM to bilateral shoulders Increased difficulty with grip      Lymphadenopathy:     Cervical: No cervical adenopathy.  Skin:    General: Skin is warm and dry.  Neurological:     Mental Status: He is alert. Mental status is at baseline.  Psychiatric:        Mood and Affect: Mood normal.        ASSESSMENT/ PLAN:   TODAY  1. Essential hypertension: is stable b/p 105/64 will continue lisinopril 5 mg daily and norvasc 5 mg daily   2. Sleep apnea; obstructive: is stable uses CPAP nightly  3. GERD without esophagitis: is stable will continue protonix 40 mg daily   PREVIOUS   4. Vascular dementia without behavioral disturbance: is without change weight is 178 pounds will continue aricept 10 mg nightly   5. Degenerative disease disease lumbar: is stable will continue mobic 15 mg nightly gabapentin 100 mg nightly  will monitor   6. Dyslipidemia is stable will continue lipitor 20 mg daily   7. 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 We will attempt to wean resident as appropriate.  Ok Edwards NP [facility]  Contact [phone] Monday through Friday 8am- 5pm  After hours call [phone]

## 2020-01-01 ENCOUNTER — Non-Acute Institutional Stay (SKILLED_NURSING_FACILITY): Payer: Medicare Other | Admitting: Adult Health

## 2020-01-01 ENCOUNTER — Encounter: Payer: Self-pay | Admitting: Adult Health

## 2020-01-01 DIAGNOSIS — F0150 Vascular dementia without behavioral disturbance: Secondary | ICD-10-CM

## 2020-01-01 DIAGNOSIS — M5412 Radiculopathy, cervical region: Secondary | ICD-10-CM | POA: Diagnosis not present

## 2020-01-01 DIAGNOSIS — I10 Essential (primary) hypertension: Secondary | ICD-10-CM | POA: Diagnosis not present

## 2020-01-01 NOTE — Progress Notes (Signed)
Location:    [place] Room Number: 127/D Place of Service:  SNF (31)   CODE STATUS: DNR  Allergies  Allergen Reactions  . Penicillins Rash  . Sulfonamide Derivatives Rash  . Tetanus Toxoid Rash    Chief Complaint  Patient presents with  . Acute Visit    Care Plan Meeting    HPI:  We have come together for his care plan meeting. BIMS 9/15 mood 2/30. Family is present. He has lost 17 pounds over the past 3 months. He continues to have extremity weakness. The ct scan has been performed and he will need to follow up with neurology. There are no reports of falls present. No reports of uncontrolled pain. He continues to be followed for his chronic illnesses including: hypertension; dementia; cervical radiculopathy.   Past Medical History:  Diagnosis Date  . Cancer (HCC)    melanoma-left side  . Chronic pain    legs and feet  . Coronary artery disease   . Dementia ([place])   . Diabetes mellitus without complication ([place])   . Foot drop   . GERD (gastroesophageal reflux disease)   . Gout   . Hyperlipidemia   . Hypertension   . OCD (obsessive compulsive disorder)   . PONV (postoperative nausea and vomiting)   . Psychosis ([place])   . Reflux   . Sarcoidosis   . Sleep apnea    cpap    Past Surgical History:  Procedure Laterality Date  . BACK SURGERY     cervical neck  . CATARACT EXTRACTION W/PHACO  01/05/2012   Procedure: CATARACT EXTRACTION PHACO AND INTRAOCULAR LENS PLACEMENT (IOC);  Surgeon: [name] Branch, MD;  Location: [facility];  Service: Ophthalmology;  Laterality: Left;  CDE 18.47  . CATARACT EXTRACTION W/PHACO  02/02/2012   Procedure: CATARACT EXTRACTION PHACO AND INTRAOCULAR LENS PLACEMENT (IOC);  Surgeon: [name] Branch, MD;  Location: [facility];  Service: Ophthalmology;  Laterality: Right;  CDE:15.38  . CHOLECYSTECTOMY    . GALLBLADDER SURGERY    . hemorhoidectomy    . MELANOMA EXCISION     left side-[name]  . TONSILLECTOMY      Social History    Socioeconomic History  . Marital status: Married    Spouse name: Not on file  . Number of children: Not on file  . Years of education: Not on file  . Highest education level: Not on file  Occupational History  . Occupation: retired   Tobacco Use  . Smoking status: Former Smoker    Packs/day: 0.25    Years: 20.00    Pack years: 5.00    Types: Pipe    Quit date: 01/02/1974    Years since quitting: 46.0  . Smokeless tobacco: Never Used  Substance and Sexual Activity  . Alcohol use: No  . Drug use: No  . Sexual activity: Not Currently  Other Topics Concern  . Not on file  Social History Narrative   Former pipe smoker, quit many years ago.   Long term resident of [facility]      Social Determinants of Health   Financial Resource Strain: Low Risk   . Difficulty of Paying Living Expenses: Not hard at all  Food Insecurity: No Food Insecurity  . Worried About Charity fundraiser in the Last Year: Never true  . Ran Out of Food in the Last Year: Never true  Transportation Needs: No Transportation Needs  . Lack of Transportation (Medical): No  . Lack of Transportation (Non-Medical):  No  Physical Activity: Inactive  . Days of Exercise per Week: 0 days  . Minutes of Exercise per Session: 0 min  Stress: No Stress Concern Present  . Feeling of Stress : Not at all  Social Connections: Moderately Isolated  . Frequency of Communication with Friends and Family: Never  . Frequency of Social Gatherings with Friends and Family: Never  . Attends Religious Services: Never  . Active Member of Clubs or Organizations: No  . Attends Archivist Meetings: Not asked  . Marital Status: Married  Human resources officer Violence: Not At Risk  . Fear of Current or Ex-Partner: No  . Emotionally Abused: No  . Physically Abused: No  . Sexually Abused: No   Family History  Problem Relation Age of Onset  . Heart disease Other   . Arthritis Other   . Cancer Other   . Anesthesia problems Neg Hx   .  Hypotension Neg Hx   . Malignant hyperthermia Neg Hx   . Pseudochol deficiency Neg Hx       VITAL SIGNS BP 124/70   Pulse 62   Temp (!) 96.8 F (36 C) (Oral)   Resp 20   Ht 5\' 9"  (1.753 m)   Wt 181 lb (82.1 kg)   SpO2 97%   BMI 26.73 kg/m   Facility-Administered Encounter Medications as of 01/01/2020  Medication  . 0.9 %  sodium chloride infusion  . albuterol (VENTOLIN HFA) 108 (90 Base) MCG/ACT inhaler 2 puff  . bamlanivimab (EUA) 700 mg in sodium chloride 0.9 % 200 mL IVPB  . diphenhydrAMINE (BENADRYL) injection 50 mg  . EPINEPHrine (EPI-PEN) injection 0.3 mg  . famotidine (PEPCID) 20 mg in sodium chloride 0.9 % 50 mL IVPB  . methylPREDNISolone sodium succinate (SOLU-MEDROL) 130 mg in sodium chloride 0.9 % 50 mL IVPB   Outpatient Encounter Medications as of 01/01/2020  Medication Sig  . acetaminophen (TYLENOL) 500 MG tablet Take 500 mg by mouth every 6 (six) hours as needed. For pain   . albuterol (VENTOLIN HFA) 108 (90 Base) MCG/ACT inhaler Inhale 2 puffs into the lungs every 4 (four) hours as needed for wheezing or shortness of breath.  . allopurinol (ZYLOPRIM) 300 MG tablet Take 300 mg by mouth daily.    [name] (NORVASC) 5 MG tablet Take 1 tablet (5 mg total) by mouth daily.  [name] EC 81 MG tablet Take 81 mg by mouth daily.  [name] (LIPITOR) 20 MG tablet Take 1 tablet (20 mg total) by mouth daily.  Roseanne Kaufman [place]-Castor Oil (VENELEX) OINT Apply to sacrum, coccyx, and bilateral buttocks each shift and prn   . donepezil (ARICEPT) 10 MG tablet Take 10 mg by mouth at bedtime.   . gabapentin (NEURONTIN) 100 MG capsule Take 100 mg by mouth at bedtime.  . Glucerna (GLUCERNA) LIQD Take 237 mLs by mouth 2 (two) times daily between meals.  [name] (ZESTRIL) 5 MG tablet Take 1 tablet (5 mg total) by mouth daily.  . meloxicam (MOBIC) 15 MG tablet Take 15 mg by mouth daily.   . NON FORMULARY Diet: Regular,NAS, Consistent Carbohydrate  . pantoprazole (PROTONIX) 40  MG tablet Take 1 tablet (40 mg total) by mouth daily.  . Vitamins A & D (VITAMIN A & D) ointment Apply A&D ointment to right foot and ankle qshift & prn for prevention. Every Shift  . [DISCONTINUED] aspirin EC 81 MG tablet Take 81 mg by mouth daily.  . [DISCONTINUED] NON FORMULARY CPAP while  sleeping. May use CPAP from home at previous home settings. Every Shift Day, Evening, Night  . [DISCONTINUED] NON FORMULARY Clean CPAP mask and tubing with baby shampoo and rinse well with warm water weekly and PRN Special Instructions: Clean weekly and PRN Once A Day on Tue 03:15 PM - 11:15 PM     SIGNIFICANT DIAGNOSTIC EXAMS   PREVIOUS;   06-19-19: chest x-[place]: Minimal right basilar subsegmental atelectasis   06-19-19: ct of head: Mild diffuse cortical atrophy. No acute intracranial abnormality seen.  06-19-19: ct of lumbar spine:  1. No acute/traumatic lumbar spine pathology. 2. Osteopenia with extensive multilevel degenerative changes of the spine. 3. Lumbar levoscoliosis and multilevel disc bulge and neural foraminal narrowing. MRI may provide better evaluation. Aortic Atherosclerosis   06-22-19: lumbar spine x-[place]: 1. Degenerative changes. 2. No evidence for acute abnormality  TODAY;   12-17-19: ct of head and cervical spine:   1. No acute intracranial pathology. Small-vessel white matter disease. 2. No fracture or static subluxation of the cervical spine. 3. Anterior cervical discectomy and fusion of C4 through C6, withincorporation of the disc spaces and bony ankylosis of the included levels inferior to this through T3. 4. Prominent osteophytes, disc calcifications and calcifications ofthe ligamentum flavum, which appear to significantly narrow the cervical canal at C3-C4, minimum AP diameter approximately 4 mm. MRI may be used to better evaluate the cervical spinal cord if indicated by localizing neurological signs and symptoms.  LABS REVIEWED PREVIOUS;   06-19-19: wbc 8.4; hgb 14.2;  hct 44.4; mcv 93.7 plt 317; glucose 106; bun 26; creat 0.82; k+ 4.1; na++ 138; ca 8.8; liver normal albumin 3.4; urine culture no growth 06-20-19-: wbc 7.7; hgb 13.1 hct 42.2; mcv 94.8; plt 306; glucose 108; bun 24; creat 0.80; k+ 3.9; na++ 138; ca 8.8 ;liver normal albumin 3.3  08-23-19: hgb a1c 5.6  10-05-19: wbc 4.8; hgb 11.8; hct 36.2; mcv 89.8 plt 256; glucose 100; bun 18; creat 0.53 ;k+ 3.9; an++ 128; ca 8.6; d-dimer: 3.36 ferritin 965  NO NEW LABS.   Review of Systems  Constitutional: Negative for malaise/fatigue.  Respiratory: Negative for cough and shortness of breath.   Cardiovascular: Negative for chest pain, palpitations and leg swelling.  Gastrointestinal: Negative for abdominal pain, constipation and heartburn.  Musculoskeletal: Negative for back pain, joint pain and myalgias.  Skin: Negative.   Neurological: Positive for weakness. Negative for dizziness.  Psychiatric/Behavioral: The patient is not nervous/anxious.     Physical Exam Constitutional:      General: He is not in acute distress.    Appearance: He is well-developed. He is not diaphoretic.  Neck:     Thyroid: No thyromegaly.  Cardiovascular:     Rate and Rhythm: Normal rate and regular rhythm.     Pulses: Normal pulses.     Heart sounds: Normal heart sounds.  Pulmonary:     Effort: Pulmonary effort is normal. No respiratory distress.     Breath sounds: Normal breath sounds.  Abdominal:     General: Bowel sounds are normal. There is no distension.     Palpations: Abdomen is soft.     Tenderness: There is no abdominal tenderness.  Musculoskeletal:        General: Normal range of motion.     Cervical back: Neck supple.  Lymphadenopathy:     Cervical: No cervical adenopathy.  Skin:    General: Skin is warm and dry.  Neurological:     Mental Status: He is alert and oriented to  person, place, and time.       ASSESSMENT/ PLAN:  TODAY  1. Essential hypertension 2. Cervical spine radiculopathy 3.  Vascular dementia without behavioral disturbance  Will continue current medications Will continue current plan of care Will setup for neurology follow up  Will monitor his status.   MD is aware of resident's narcotic use and is in agreement with current plan of care. We will attempt to wean resident as appropriate.  Ok Edwards NP [facility]  Contact [phone] Monday through Friday 8am- 5pm  After hours call [phone]

## 2020-01-13 DIAGNOSIS — M4722 Other spondylosis with radiculopathy, cervical region: Secondary | ICD-10-CM | POA: Diagnosis not present

## 2020-01-13 DIAGNOSIS — R030 Elevated blood-pressure reading, without diagnosis of hypertension: Secondary | ICD-10-CM | POA: Diagnosis not present

## 2020-01-13 DIAGNOSIS — M6281 Muscle weakness (generalized): Secondary | ICD-10-CM | POA: Diagnosis not present

## 2020-01-22 ENCOUNTER — Encounter: Payer: Self-pay | Admitting: Internal Medicine

## 2020-01-22 ENCOUNTER — Non-Acute Institutional Stay (SKILLED_NURSING_FACILITY): Payer: Medicare Other | Admitting: Internal Medicine

## 2020-01-22 DIAGNOSIS — G4733 Obstructive sleep apnea (adult) (pediatric): Secondary | ICD-10-CM | POA: Diagnosis not present

## 2020-01-22 DIAGNOSIS — Z9989 Dependence on other enabling machines and devices: Secondary | ICD-10-CM

## 2020-01-22 DIAGNOSIS — M5136 Other intervertebral disc degeneration, lumbar region: Secondary | ICD-10-CM

## 2020-01-22 DIAGNOSIS — E785 Hyperlipidemia, unspecified: Secondary | ICD-10-CM | POA: Diagnosis not present

## 2020-01-22 NOTE — Progress Notes (Signed)
Location:  [place] Room Number: 127-D Place of Service:  SNF (31)  Hennie Duos, MD  Patient Care Team: Hennie Duos, MD as PCP - General (Internal Medicine) [name] [name], NP as Nurse Practitioner ([place]) [facility], [place] ([place])  Extended Emergency Contact Information Primary Emergency Contact: [facility] Address: [address]          [place], [place] [address] [name] of [place] Phone: [phone] Work Phone: [phone] Relation: Spouse    Allergies: Penicillins, Sulfonamide derivatives, and Tetanus toxoid  Chief Complaint  Patient presents with  . Medical Management of Chronic Issues    Routine [place] visit    HPI: Patient is an 86 y.o. male who is being seen for routine issues of gout, degenerative disc disease lumbar, and OSA.  Past Medical History:  Diagnosis Date  . Cancer (HCC)    melanoma-left side  . Chronic pain    legs and feet  . Coronary artery disease   . Dementia ([place])   . Diabetes mellitus without complication ([place])   . Foot drop   . GERD (gastroesophageal reflux disease)   . Gout   . Hyperlipidemia   . Hypertension   . OCD (obsessive compulsive disorder)   . PONV (postoperative nausea and vomiting)   . Psychosis ([place])   . Reflux   . Sarcoidosis   . Sleep apnea    cpap    Past Surgical History:  Procedure Laterality Date  . BACK SURGERY     cervical neck  . CATARACT EXTRACTION W/PHACO  01/05/2012   Procedure: CATARACT EXTRACTION PHACO AND INTRAOCULAR LENS PLACEMENT (IOC);  Surgeon: [name] Branch, MD;  Location: [facility];  Service: Ophthalmology;  Laterality: Left;  CDE 18.47  . CATARACT EXTRACTION W/PHACO  02/02/2012   Procedure: CATARACT EXTRACTION PHACO AND INTRAOCULAR LENS PLACEMENT (IOC);  Surgeon: [name] Branch, MD;  Location: [facility];  Service: Ophthalmology;  Laterality: Right;  CDE:15.38  . CHOLECYSTECTOMY    . GALLBLADDER SURGERY      . hemorhoidectomy    . MELANOMA EXCISION     left side-[name]  . TONSILLECTOMY      Allergies as of 01/22/2020      Reactions   Penicillins Rash   Sulfonamide Derivatives Rash   Tetanus Toxoid Rash      Medication List    Notice   This visit is during an admission. Changes to the med list made in this visit will be reflected in the After Visit Summary of the admission.     No orders of the defined types were placed in this encounter.   Immunization History  Administered Date(s) Administered  . Influenza Whole 08/15/2007  . Influenza-Unspecified 07/11/2019  . Pneumococcal Conjugate-13 08/05/2019    Social History   Tobacco Use  . Smoking status: Former Smoker    Packs/day: 0.25    Years: 20.00    Pack years: 5.00    Types: Pipe    Quit date: 01/02/1974    Years since quitting: 46.0  . Smokeless tobacco: Never Used  Substance Use Topics  . Alcohol use: No    Review of Systems  GENERAL:  no fevers, fatigue, appetite changes SKIN: No itching, rash HEENT: No complaint RESPIRATORY: No cough, wheezing, SOB CARDIAC: No chest pain, palpitations, lower extremity edema  GI: No abdominal pain, No N/V/D or constipation, No heartburn or reflux  GU: No dysuria, frequency or urgency, or incontinence  MUSCULOSKELETAL: No unrelieved  bone/joint pain NEUROLOGIC: No headache, dizziness  PSYCHIATRIC: No overt anxiety or sadness  Vitals:   01/22/20 1519  BP: 136/71  Pulse: 66  Resp: 20  Temp: 98.2 F (36.8 C)  SpO2: 97%   Body mass index is 26.73 kg/m. Physical Exam  GENERAL APPEARANCE: Alert, conversant, No acute distress  SKIN: No diaphoresis rash HEENT: Unremarkable RESPIRATORY: Breathing is even, unlabored. Lung sounds are clear   CARDIOVASCULAR: Heart RRR no murmurs, rubs or gallops. No peripheral edema  GASTROINTESTINAL: Abdomen is soft, non-tender, not distended w/ normal bowel sounds.  GENITOURINARY: Bladder non tender, not distended  MUSCULOSKELETAL:  Contractures right fingers NEUROLOGIC: Cranial nerves 2-12 grossly intact. Moves all extremities PSYCHIATRIC: Mood and affect appropriate to situation, no behavioral issues  Patient Active Problem List   Diagnosis Date Noted  . Weight loss 12/17/2019  . Poliomyelitis osteopathy of lower leg, right ([place]) 11/01/2019  . COVID-19 10/15/2019  . Radiculopathy of cervical spine 09/09/2019  . Dyslipidemia 06/24/2019  . Vascular dementia without behavioral disturbance ([place]) 06/24/2019  . Chronic gout without tophus 06/24/2019  . Physical deconditioning 06/24/2019  . Multiple falls 06/20/2019  . Arthritis of knee, left 01/03/2013  . Degenerative disc disease, lumbar 01/03/2013  . Coronary artery disease 09/13/2011  . Obstructive sleep apnea on CPAP 09/10/2007  . Essential hypertension 05/02/2007  . GERD 05/02/2007    CMP     Component Value Date/Time   NA 133 (L) 12/02/2019 0800   K 3.9 12/02/2019 0800   CL 100 12/02/2019 0800   CO2 25 12/02/2019 0800   GLUCOSE 87 12/02/2019 0800   BUN 19 12/02/2019 0800   CREATININE 0.42 (L) 12/02/2019 0800   CALCIUM 8.7 (L) 12/02/2019 0800   PROT 6.1 (L) 06/20/2019 0634   ALBUMIN 3.3 (L) 06/20/2019 0634   AST 14 (L) 06/20/2019 0634   ALT 15 06/20/2019 0634   ALKPHOS 74 06/20/2019 0634   BILITOT 0.7 06/20/2019 0634   GFRNONAA >60 12/02/2019 0800   GFRAA >60 12/02/2019 0800   Recent Labs    10/05/19 1130 10/16/19 0630 12/02/19 0800  NA 128* 134* 133*  K 3.9 3.7 3.9  CL 97* 100 100  CO2 22 26 25   GLUCOSE 100* 96 87  BUN 18 14 19   CREATININE 0.53* 0.47* 0.42*  CALCIUM 8.6* 8.5* 8.7*   Recent Labs    06/19/19 1504 06/20/19 0634  AST 17 14*  ALT 17 15  ALKPHOS 79 74  BILITOT 0.4 0.7  PROT 6.6 6.1*  ALBUMIN 3.4* 3.3*   Recent Labs    06/19/19 1504 06/19/19 1504 06/20/19 0634 10/05/19 1130 12/02/19 0800  WBC 8.4   < > 7.7 4.8 5.9  NEUTROABS 5.5  --   --   --   --   HGB 14.2   < > 13.1 11.8* 12.4*  HCT 44.4   < > 42.2  36.2* 38.9*  MCV 93.7   < > 94.8 89.8 91.1  PLT 317   < > 306 256 227   < > = values in this interval not displayed.   No results for input(s): CHOL, LDLCALC, TRIG in the last 8760 hours.  Invalid input(s): HCL No results found for: MICROALBUR No results found for: TSH Lab Results  Component Value Date   HGBA1C 5.6 08/23/2019   Lab Results  Component Value Date   CHOL 113 09/02/2008   HDL 40 09/02/2008   LDLCALC 52 09/02/2008   TRIG 104 09/02/2008   CHOLHDL 2.8 Ratio 09/02/2008  Significant Diagnostic Results in last 30 days:  No results found.  Assessment and Plan  Dyslipidemia Chronic and stable continue allopurinol 300 mg daily  Degenerative disc disease, lumbar Chronic and stable; continue Mobic 15 mg nightly and Neurontin 100 mg nightly  Obstructive sleep apnea on CPAP Chronic and stable; continue CPAP nightly    Hennie Duos, MD

## 2020-01-26 ENCOUNTER — Encounter: Payer: Self-pay | Admitting: Internal Medicine

## 2020-01-26 NOTE — Assessment & Plan Note (Signed)
Chronic and stable continue allopurinol 300 mg daily

## 2020-01-26 NOTE — Assessment & Plan Note (Signed)
Chronic and stable; continue Mobic 15 mg nightly and Neurontin 100 mg nightly

## 2020-01-26 NOTE — Assessment & Plan Note (Signed)
Chronic and stable; continue CPAP nightly

## 2020-01-28 ENCOUNTER — Telehealth: Payer: Self-pay

## 2020-01-28 NOTE — Telephone Encounter (Signed)
After speaking to Bard Herbert about this matter she informed me that she doesn't handle these type things and I should call the gentleman back that I spoke with and give him the number for the facility and have him ask for [facility] I called the number back that I was given and a gentleman named Lennette Bihari answered I spoke to him of what had transpired and I gave him the phone number for [facility] and he said he would send the information to [employer] the gentleman I had originally spoken with

## 2020-01-28 NOTE — Telephone Encounter (Signed)
Edmond  from [facility] called to request to have a prescription filled for patient they are a CPAP company they are sending a refill request by fax I told them I would e-mail you with this information their number is [phone]

## 2020-01-30 ENCOUNTER — Other Ambulatory Visit (HOSPITAL_COMMUNITY)
Admission: RE | Admit: 2020-01-30 | Discharge: 2020-01-30 | Disposition: A | Discharge status: Home / Self Care | Payer: Medicare Other | Source: Skilled Nursing Facility | Attending: Adult Health | Admitting: Adult Health

## 2020-01-30 DIAGNOSIS — E785 Hyperlipidemia, unspecified: Secondary | ICD-10-CM | POA: Diagnosis present

## 2020-01-30 LAB — LIPID PANEL
Cholesterol: 96 mg/dL (ref 0–200)
HDL: 44 mg/dL (ref >40)
LDL Cholesterol: 43 mg/dL (ref 0–99)
Total CHOL/HDL Ratio: 2.2 RATIO
Triglycerides: 45 mg/dL (ref <150)
VLDL: 9 mg/dL (ref 0–40)

## 2020-02-13 ENCOUNTER — Ambulatory Visit (HOSPITAL_COMMUNITY)
Admit: 2020-02-13 | Discharge: 2020-02-13 | Disposition: A | Discharge status: Home / Self Care | Payer: Medicare Other | Attending: Neurosurgery | Admitting: Neurosurgery

## 2020-02-13 ENCOUNTER — Ambulatory Visit (HOSPITAL_COMMUNITY)
Admission: RE | Admit: 2020-02-13 | Discharge: 2020-02-13 | Disposition: A | Discharge status: Home / Self Care | Payer: Medicare Other | Source: Ambulatory Visit | Attending: Neurosurgery | Admitting: Neurosurgery

## 2020-02-13 DIAGNOSIS — M4722 Other spondylosis with radiculopathy, cervical region: Secondary | ICD-10-CM | POA: Insufficient documentation

## 2020-02-13 LAB — POCT I-STAT CREATININE: Creatinine, Ser: 0.5 mg/dL — ABNORMAL LOW (ref 0.61–1.24)

## 2020-02-13 MED ORDER — GADOBUTROL 1 MMOL/ML IV SOLN
7.0000 mL | Freq: Once | INTRAVENOUS | Status: AC | PRN
  Administered 2020-02-13: 7 mL via INTRAVENOUS

## 2020-02-27 ENCOUNTER — Encounter: Payer: Self-pay | Admitting: Adult Health

## 2020-02-27 ENCOUNTER — Non-Acute Institutional Stay (SKILLED_NURSING_FACILITY): Payer: Medicare Other | Admitting: Adult Health

## 2020-02-27 DIAGNOSIS — E785 Hyperlipidemia, unspecified: Secondary | ICD-10-CM | POA: Diagnosis not present

## 2020-02-27 DIAGNOSIS — M5412 Radiculopathy, cervical region: Secondary | ICD-10-CM

## 2020-02-27 DIAGNOSIS — F0150 Vascular dementia without behavioral disturbance: Secondary | ICD-10-CM

## 2020-02-27 DIAGNOSIS — M5136 Other intervertebral disc degeneration, lumbar region: Secondary | ICD-10-CM | POA: Diagnosis not present

## 2020-02-27 NOTE — Progress Notes (Signed)
Location:    [facility] Room Number: 127/D Place of Service:  SNF (31)   CODE STATUS: DNR  Allergies  Allergen Reactions  . Other   . Penicillin G   . Sulfonic Acid (3,5-Dibromo-4-H-Ox-Benz)   . Penicillins Rash  . Sulfonamide Derivatives Rash  . Tetanus Toxoid Rash    Chief Complaint  Patient presents with  . Medical Management of Chronic Issues        Vascular of dementia without behavioral disturbance:  Degenerative disc disease lumbar/cervical disc disease:  Dyslipidemia:    HPI:  He is a 86 year old long term resident of this facility being seen for the management of his chronic illnesses: dementia; dyslipidemia; lumbar disc disease. He has been seen for his cervical stenosis is awaiting cardiac clearance. There are no reports of uncontrolled pain; no changes in appetite; on constipation.   Past Medical History:  Diagnosis Date  . Cancer (HCC)    melanoma-left side  . Chronic pain    legs and feet  . Coronary artery disease   . Dementia ([place])   . Diabetes mellitus without complication ([place])   . Foot drop   . GERD (gastroesophageal reflux disease)   . Gout   . Hyperlipidemia   . Hypertension   . OCD (obsessive compulsive disorder)   . PONV (postoperative nausea and vomiting)   . Psychosis ([place])   . Reflux   . Sarcoidosis   . Sleep apnea    cpap    Past Surgical History:  Procedure Laterality Date  . BACK SURGERY     cervical neck  . CATARACT EXTRACTION W/PHACO  01/05/2012   Procedure: CATARACT EXTRACTION PHACO AND INTRAOCULAR LENS PLACEMENT (IOC);  Surgeon: [name] Branch, MD;  Location: [facility];  Service: Ophthalmology;  Laterality: Left;  CDE 18.47  . CATARACT EXTRACTION W/PHACO  02/02/2012   Procedure: CATARACT EXTRACTION PHACO AND INTRAOCULAR LENS PLACEMENT (IOC);  Surgeon: [name] Branch, MD;  Location: [facility];  Service: Ophthalmology;  Laterality: Right;  CDE:15.38  . CHOLECYSTECTOMY    . GALLBLADDER SURGERY    . hemorhoidectomy    .  MELANOMA EXCISION     left side-[name]  . TONSILLECTOMY      Social History   Socioeconomic History  . Marital status: Married    Spouse name: Not on file  . Number of children: Not on file  . Years of education: Not on file  . Highest education level: Not on file  Occupational History  . Occupation: retired   Tobacco Use  . Smoking status: Former Smoker    Packs/day: 0.25    Years: 20.00    Pack years: 5.00    Types: Pipe    Quit date: 01/02/1974    Years since quitting: 46.1  . Smokeless tobacco: Never Used  Substance and Sexual Activity  . Alcohol use: No  . Drug use: No  . Sexual activity: Not Currently  Other Topics Concern  . Not on file  Social History Narrative   Former pipe smoker, quit many years ago.   Long term resident of [facility]      Social Determinants of Health   Financial Resource Strain: Low Risk   . Difficulty of Paying Living Expenses: Not hard at all  Food Insecurity: No Food Insecurity  . Worried About Charity fundraiser in the Last Year: Never true  . Ran Out of Food in the Last Year: Never true  Transportation Needs: No Transportation Needs  .  Lack of Transportation (Medical): No  . Lack of Transportation (Non-Medical): No  Physical Activity: Inactive  . Days of Exercise per Week: 0 days  . Minutes of Exercise per Session: 0 min  Stress: No Stress Concern Present  . Feeling of Stress : Not at all  Social Connections: Moderately Isolated  . Frequency of Communication with Friends and Family: Never  . Frequency of Social Gatherings with Friends and Family: Never  . Attends Religious Services: Never  . Active Member of Clubs or Organizations: No  . Attends Archivist Meetings: Not asked  . Marital Status: Married  Human resources officer Violence: Not At Risk  . Fear of Current or Ex-Partner: No  . Emotionally Abused: No  . Physically Abused: No  . Sexually Abused: No   Family History  Problem Relation Age of Onset  . Heart disease  Other   . Arthritis Other   . Cancer Other   . Anesthesia problems Neg Hx   . Hypotension Neg Hx   . Malignant hyperthermia Neg Hx   . Pseudochol deficiency Neg Hx       VITAL SIGNS BP (!) 102/56   Pulse (!) 57   Temp 98.8 F (37.1 C) (Oral)   Resp 16   Ht 5\' 9"  (1.753 m)   Wt 181 lb 6.4 oz (82.3 kg)   SpO2 97%   BMI 26.79 kg/m   Outpatient Encounter Medications as of 02/27/2020  Medication Sig  . acetaminophen (TYLENOL) 500 MG tablet Take 500 mg by mouth every 6 (six) hours as needed. For pain   . albuterol (VENTOLIN HFA) 108 (90 Base) MCG/ACT inhaler Inhale 2 puffs into the lungs every 4 (four) hours as needed for wheezing or shortness of breath.  . allopurinol (ZYLOPRIM) 300 MG tablet Take 300 mg by mouth daily.    [name] (NORVASC) 5 MG tablet Take 1 tablet (5 mg total) by mouth daily.  [name] EC 81 MG tablet Take 81 mg by mouth daily.  [name] (LIPITOR) 20 MG tablet Take 1 tablet (20 mg total) by mouth daily.  Roseanne Kaufman Peru-Castor Oil (VENELEX) OINT Apply to sacrum, coccyx, and bilateral buttocks each shift and prn   . Balsam [place]-Castor Oil (VENELEX) OINT Apply topically. apply to the buttocks q shift and PRN  . donepezil (ARICEPT) 10 MG tablet Take 10 mg by mouth at bedtime.   . feeding supplement, GLUCERNA SHAKE, (GLUCERNA SHAKE) LIQD Take 237 mLs by mouth 2 (two) times daily between meals.   . gabapentin (NEURONTIN) 100 MG capsule Take 100 mg by mouth at bedtime.  [name] (ZESTRIL) 5 MG tablet Take 1 tablet (5 mg total) by mouth daily.  . meloxicam (MOBIC) 15 MG tablet Take 15 mg by mouth daily.   . NON FORMULARY Diet: Regular,NAS, Consistent Carbohydrate  . NON FORMULARY Diet: _____ Regular, __X____ NAS, _______Consistent Carbohydrate, _______NPO _____Other  . pantoprazole (PROTONIX) 40 MG tablet Take 1 tablet (40 mg total) by mouth daily.  . Vitamins A & D (VITAMIN A & D) ointment Apply A&D ointment to right foot and ankle qshift & prn for  prevention. Every Shift  . Vitamins A & D (VITAMIN A & D) ointment Apply A&D ointment to left heel qshift & prn for prevention.  . [DISCONTINUED] Glucerna (GLUCERNA) LIQD Take 237 mLs by mouth 2 (two) times daily between meals.   No facility-administered encounter medications on file as of 02/27/2020.     SIGNIFICANT DIAGNOSTIC EXAMS   PREVIOUS;  06-19-19: chest x-[place]: Minimal right basilar subsegmental atelectasis   06-19-19: ct of head: Mild diffuse cortical atrophy. No acute intracranial abnormality seen.  06-19-19: ct of lumbar spine:  1. No acute/traumatic lumbar spine pathology. 2. Osteopenia with extensive multilevel degenerative changes of the spine. 3. Lumbar levoscoliosis and multilevel disc bulge and neural foraminal narrowing. MRI may provide better evaluation. Aortic Atherosclerosis   06-22-19: lumbar spine x-[place]: 1. Degenerative changes. 2. No evidence for acute abnormality  TODAY  12-17-19: CT of head and cervical spine:  1. No acute intracranial pathology. Small-vessel white matter disease. 2. No fracture or static subluxation of the cervical spine. 3. Anterior cervical discectomy and fusion of C4 through C6, with incorporation of the disc spaces and bony ankylosis of the included levels inferior to this through T3. 4. Prominent osteophytes, disc calcifications and calcifications of the ligamentum flavum, which appear to significantly narrow the cervical canal at C3-C4, minimum AP diameter approximately 4 mm.  02-13-20: MRI lumbar spine:  1. L2 fracture involving the anterior wall and superior endplate, likely subacute. The appearance suggest a hyperextension mechanism. No height loss. 2. L3-L4 severe spinal canal stenosis with severe right and moderate left neural foraminal stenosis. 3. T12-L1 moderate spinal canal stenosis and severe bilateral neural foraminal stenosis. 4. L1-L2 and L2-L3 severe right neural foraminal stenosis. 5. L5-S1 moderate bilateral neural  foraminal stenosis.   02-13-20: MRI cervical spine:  1. 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 2. C4-6 ACDF without spinal canal stenosis. 3. Mild bilateral C6-7 neural foraminal stenosis.    LABS REVIEWED PREVIOUS;   06-19-19: wbc 8.4; hgb 14.2; hct 44.4; mcv 93.7 plt 317; glucose 106; bun 26; creat 0.82; k+ 4.1; na++ 138; ca 8.8; liver normal albumin 3.4; urine culture no growth 06-20-19-: wbc 7.7; hgb 13.1 hct 42.2; mcv 94.8; plt 306; glucose 108; bun 24; creat 0.80; k+ 3.9; na++ 138; ca 8.8 ;liver normal albumin 3.3  08-23-19: hgb a1c 5.6  10-05-19: wbc 4.8; hgb 11.8; hct 36.2; mcv 89.8 plt 256; glucose 100; bun 18; creat 0.53 ;k+ 3.9; an++ 128; ca 8.6; d-dimer: 3.36 ferritin 965  TODAY  10-16-19: glucose 96; bun 14; creat 0.47; k+ 3.7; na++ 134; ca 8.5 12-02-19: wbc 5.9; hgb 12.4; hct 38.9; mcv 91.1 plt 227; chol 96; ldl 43; trig 45; hdl 44    Review of Systems  Constitutional: Negative for malaise/fatigue.  Respiratory: Negative for cough and shortness of breath.   Cardiovascular: Negative for chest pain, palpitations and leg swelling.  Gastrointestinal: Negative for abdominal pain, constipation and heartburn.  Musculoskeletal: Negative for back pain, joint pain and myalgias.  Skin: Negative.   Neurological: Negative for dizziness.  Psychiatric/Behavioral: The patient is not nervous/anxious.     Physical Exam Constitutional:      General: He is not in acute distress.    Appearance: He is well-developed. He is not diaphoretic.  Neck:     Thyroid: No thyromegaly.  Cardiovascular:     Rate and Rhythm: Normal rate and regular rhythm.     Pulses: Normal pulses.     Heart sounds: Normal heart sounds.  Pulmonary:     Effort: Pulmonary effort is normal. No respiratory distress.     Breath sounds: Normal breath sounds.  Abdominal:     General: Bowel sounds are  normal. There is no distension.     Palpations: Abdomen is soft.     Tenderness:  There is no abdominal tenderness.  Musculoskeletal:     Cervical back: Neck supple.     Right lower leg: No edema.     Left lower leg: No edema.     Comments: Is able to move all extremities   Lymphadenopathy:     Cervical: No cervical adenopathy.  Skin:    General: Skin is warm and dry.  Neurological:     Mental Status: He is alert and oriented to person, place, and time.  Psychiatric:        Mood and Affect: Mood normal.     ASSESSMENT/ PLAN:  TODAY  1. Vascular of dementia without behavioral disturbance: is without change: weight is 181 pounds; will continue aricept 10 mg nightly   2. Degenerative disc disease lumbar/cervical disc disease: will continue mobic 15 mg daily and gabapentin 100 mg nightly   3. Dyslipidemia: is stable LDL 43 will continue lipitor 20 mg daily   PREVIOUS   4. Idiopathic chronic gout of multiple sites without tophi: is stable no recent flares; will continue allopurinol 300 mg daily  5. Essential hypertension: is stable b/p 102/56 will continue lisinopril 5 mg daily and norvasc 5 mg daily   6. Sleep apnea; obstructive: is stable uses CPAP nightly will decline to use at times.   7. GERD without esophagitis: is stable will continue protonix 40 mg daily      MD is aware of resident's narcotic use and is in agreement with current plan of care. We will attempt to wean resident as appropriate.  Ok Edwards NP [facility]  Contact [phone] Monday through Friday 8am- 5pm  After hours call [phone]

## 2020-03-03 ENCOUNTER — Encounter: Payer: Self-pay | Admitting: Cardiovascular Disease

## 2020-03-03 ENCOUNTER — Encounter: Payer: Self-pay | Admitting: *Deleted

## 2020-03-03 ENCOUNTER — Ambulatory Visit (INDEPENDENT_AMBULATORY_CARE_PROVIDER_SITE_OTHER): Payer: Medicare Other | Admitting: Cardiovascular Disease

## 2020-03-03 VITALS — BP 116/60 | HR 67 | Temp 98.1°F

## 2020-03-03 DIAGNOSIS — I25118 Atherosclerotic heart disease of native coronary artery with other forms of angina pectoris: Secondary | ICD-10-CM | POA: Diagnosis not present

## 2020-03-03 DIAGNOSIS — Z955 Presence of coronary angioplasty implant and graft: Secondary | ICD-10-CM

## 2020-03-03 DIAGNOSIS — Z01818 Encounter for other preprocedural examination: Secondary | ICD-10-CM | POA: Diagnosis not present

## 2020-03-03 DIAGNOSIS — I10 Essential (primary) hypertension: Secondary | ICD-10-CM

## 2020-03-03 DIAGNOSIS — E785 Hyperlipidemia, unspecified: Secondary | ICD-10-CM

## 2020-03-03 DIAGNOSIS — R079 Chest pain, unspecified: Secondary | ICD-10-CM

## 2020-03-03 NOTE — Progress Notes (Signed)
CARDIOLOGY CONSULT NOTE  Patient ID: Eric Bowman MRN: [id number] DOB/AGE: [birth year] 86 y.o.  Admit date: (Not on file) Primary Physician: Gerlene Fee, NP  Reason for Consultation: Preoperative risk stratification  HPI: Eric Bowman is a 86 y.o. male who is being seen today for the evaluation of Preoperative risk stratification at the request of Hennie Duos, MD.   He supposedly has a history of coronary artery disease and remote stent placement although I am unable to locate any cardiac catheterization reports.  I did find a note by Dr. Mar Daring dated 09/13/2011.  I saw his wife as a new patient consultation in the office earlier this morning.  They have been married for over 62 years.  He currently resides at the [facility].  He has cervical spine stenosis with compressive myelopathy.  He requires surgery for this.  He is here with his wife.  He has been unable to walk for over 3 years.  He is confined to a wheelchair.  He has episodic right-sided chest pains associated with movement in the bed.  He said his breathing is shallow.  Social history: He used to work for the [employer].  He moved to [place] in 1974 not married 1975.  He is originally from [place].  His best friend in the service was from [place].   Allergies  Allergen Reactions  . Other   . Penicillin G   . Sulfonic Acid (3,5-Dibromo-4-H-Ox-Benz)   . Penicillins Rash  . Sulfonamide Derivatives Rash  . Tetanus Toxoid Rash    No current outpatient medications on file.   No current facility-administered medications for this visit.    Past Medical History:  Diagnosis Date  . Cancer (HCC)    melanoma-left side  . Chronic pain    legs and feet  . Coronary artery disease   . Dementia ([place])   . Diabetes mellitus without complication ([place])   . Foot drop   . GERD (gastroesophageal reflux disease)   . Gout   . Hyperlipidemia   . Hypertension   . OCD (obsessive compulsive  disorder)   . PONV (postoperative nausea and vomiting)   . Psychosis ([place])   . Reflux   . Sarcoidosis   . Sleep apnea    cpap    Past Surgical History:  Procedure Laterality Date  . BACK SURGERY     cervical neck  . CATARACT EXTRACTION W/PHACO  01/05/2012   Procedure: CATARACT EXTRACTION PHACO AND INTRAOCULAR LENS PLACEMENT (IOC);  Surgeon: [name] Branch, MD;  Location: [facility];  Service: Ophthalmology;  Laterality: Left;  CDE 18.47  . CATARACT EXTRACTION W/PHACO  02/02/2012   Procedure: CATARACT EXTRACTION PHACO AND INTRAOCULAR LENS PLACEMENT (IOC);  Surgeon: [name] Branch, MD;  Location: [facility];  Service: Ophthalmology;  Laterality: Right;  CDE:15.38  . CHOLECYSTECTOMY    . GALLBLADDER SURGERY    . hemorhoidectomy    . MELANOMA EXCISION     left side-[name]  . TONSILLECTOMY      Social History   Socioeconomic History  . Marital status: Married    Spouse name: Not on file  . Number of children: Not on file  . Years of education: Not on file  . Highest education level: Not on file  Occupational History  . Occupation: retired   Tobacco Use  . Smoking status: Former Smoker    Packs/day: 0.25    Years: 20.00    Pack years: 5.00  Types: Pipe    Quit date: 01/02/1974    Years since quitting: 46.1  . Smokeless tobacco: Never Used  Substance and Sexual Activity  . Alcohol use: No  . Drug use: No  . Sexual activity: Not Currently  Other Topics Concern  . Not on file  Social History Narrative   Former pipe smoker, quit many years ago.   Long term resident of [facility]      Social Determinants of Health   Financial Resource Strain: Low Risk   . Difficulty of Paying Living Expenses: Not hard at all  Food Insecurity: No Food Insecurity  . Worried About Charity fundraiser in the Last Year: Never true  . Ran Out of Food in the Last Year: Never true  Transportation Needs: No Transportation Needs  . Lack of Transportation (Medical): No  . Lack of Transportation (Non-Medical): No   Physical Activity: Inactive  . Days of Exercise per Week: 0 days  . Minutes of Exercise per Session: 0 min  Stress: No Stress Concern Present  . Feeling of Stress : Not at all  Social Connections: Moderately Isolated  . Frequency of Communication with Friends and Family: Never  . Frequency of Social Gatherings with Friends and Family: Never  . Attends Religious Services: Never  . Active Member of Clubs or Organizations: No  . Attends Archivist Meetings: Not asked  . Marital Status: Married  Human resources officer Violence: Not At Risk  . Fear of Current or Ex-Partner: No  . Emotionally Abused: No  . Physically Abused: No  . Sexually Abused: No     No family history of premature CAD in 1st degree relatives.  Current Meds  Medication Sig  . acetaminophen (TYLENOL) 500 MG tablet Take 500 mg by mouth every 6 (six) hours as needed. For pain   . albuterol (VENTOLIN HFA) 108 (90 Base) MCG/ACT inhaler Inhale 2 puffs into the lungs every 4 (four) hours as needed for wheezing or shortness of breath.  . allopurinol (ZYLOPRIM) 300 MG tablet Take 300 mg by mouth daily.    [name] (NORVASC) 5 MG tablet Take 1 tablet (5 mg total) by mouth daily.  [name] EC 81 MG tablet Take 81 mg by mouth daily.  [name] (LIPITOR) 20 MG tablet Take 1 tablet (20 mg total) by mouth daily.  Roseanne Kaufman Peru-Castor Oil (VENELEX) OINT Apply to sacrum, coccyx, and bilateral buttocks each shift and prn   . Balsam Peru-Castor Oil (VENELEX) OINT Apply topically. apply to the buttocks q shift and PRN  . donepezil (ARICEPT) 10 MG tablet Take 10 mg by mouth at bedtime.   . feeding supplement, GLUCERNA SHAKE, (GLUCERNA SHAKE) LIQD Take 237 mLs by mouth 2 (two) times daily between meals.   . gabapentin (NEURONTIN) 100 MG capsule Take 100 mg by mouth at bedtime.  [name] (ZESTRIL) 5 MG tablet Take 1 tablet (5 mg total) by mouth daily.  . meloxicam (MOBIC) 15 MG tablet Take 15 mg by mouth daily.   . NON  FORMULARY Diet: Regular,NAS, Consistent Carbohydrate  . NON FORMULARY Diet: _____ Regular, __X____ NAS, _______Consistent Carbohydrate, _______NPO _____Other  . pantoprazole (PROTONIX) 40 MG tablet Take 1 tablet (40 mg total) by mouth daily.  . Vitamins A & D (VITAMIN A & D) ointment Apply A&D ointment to right foot and ankle qshift & prn for prevention. Every Shift  . Vitamins A & D (VITAMIN A & D) ointment Apply A&D ointment to left heel  qshift & prn for prevention.      Review of systems complete and found to be negative unless listed above in HPI    Physical exam Blood pressure 116/60, pulse 67, temperature 98.1 F (36.7 C), SpO2 98 %. General: Elderly, frail NAD Neck: No JVD, no thyromegaly or thyroid nodule.  Lungs: Clear to auscultation bilaterally with normal respiratory effort. CV: Nondisplaced PMI. Regular rate and rhythm, normal S1/S2, no S3/S4, no murmur.  No peripheral edema.     Abdomen: Soft, nontender, no distention.  Skin: Intact without lesions or rashes.  Neurologic: Alert and oriented x 3.  Psych: Normal affect. Extremities: No clubbing or cyanosis.  HEENT: Normal.   ECG: Most recent ECG reviewed.   Labs: Lab Results  Component Value Date/Time   K 3.9 12/02/2019 08:00 AM   BUN 19 12/02/2019 08:00 AM   CREATININE 0.50 (L) 02/13/2020 11:48 AM   ALT 15 06/20/2019 06:34 AM   HGB 12.4 (L) 12/02/2019 08:00 AM     Lipids: Lab Results  Component Value Date/Time   LDLCALC 43 01/30/2020 08:38 AM   CHOL 96 01/30/2020 08:38 AM   TRIG 45 01/30/2020 08:38 AM   HDL 44 01/30/2020 08:38 AM        ASSESSMENT AND PLAN:   1.  Preoperative risk stratification: I reviewed the CT of the cervical spine which shows severe spinal canal stenosis at C3-4 with mass-effect on the spinal cord with compressive myelopathy.  He required surgery for this.  He has limited mobility.  I will obtain a Lexiscan Myoview to assess for significant areas of ischemia.  2.  Coronary  artery disease: Suppose it history of remote stent placement although cardiac catheterization records cannot be found. He has limited mobility.  I will obtain a Lexiscan Myoview to assess for significant areas of ischemia.  Currently on aspirin statin.  3.  Hypertension: Blood pressure is normal.  No changes to therapy.  4.  Hyperlipidemia: Currently on atorvastatin.   Disposition: Follow up to be determined  Signed: Kate Sable, M.D., F.A.C.C.  03/03/2020, 3:44 PM

## 2020-03-03 NOTE — Addendum Note (Signed)
Addended by: Levonne Hubert on: 03/03/2020 04:13 PM   Modules accepted: Orders

## 2020-03-03 NOTE — Patient Instructions (Signed)
Medication Instructions:  Your physician recommends that you continue on your current medications as directed. Please refer to the Current Medication list given to you today.  *If you need a refill on your cardiac medications before your next appointment, please call your pharmacy*   Lab Work: NONE   If you have labs (blood work) drawn today and your tests are completely normal, you will receive your results only by: [name] MyChart Message (if you have MyChart) OR . A paper copy in the mail If you have any lab test that is abnormal or we need to change your treatment, we will call you to review the results.   Testing/Procedures: Your physician has requested that you have a lexiscan myoview. For further information please visit [email or web address]. Please follow instruction sheet, as given.     Follow-Up: At [facility], you and your health needs are our priority.  As part of our continuing mission to provide you with exceptional heart care, we have created designated Provider Care Teams.  These Care Teams include your primary Cardiologist (physician) and Advanced Practice Providers (APPs -  Physician Assistants and Nurse Practitioners) who all work together to provide you with the care you need, when you need it.  We recommend signing up for the patient portal called "MyChart".  Sign up information is provided on this After Visit Summary.  MyChart is used to connect with patients for Virtual Visits (Telemedicine).  Patients are able to view lab/test results, encounter notes, upcoming appointments, etc.  Non-urgent messages can be sent to your provider as well.   To learn more about what you can do with MyChart, go to [email or web address].    Your next appointment:    To Be Determined   The format for your next appointment:   Either In Person or Virtual  Provider:   Kate Sable, MD   Other Instructions Thank you for choosing [facility]!

## 2020-03-20 ENCOUNTER — Encounter (HOSPITAL_COMMUNITY): Payer: Self-pay

## 2020-03-20 ENCOUNTER — Encounter (HOSPITAL_COMMUNITY)
Admit: 2020-03-20 | Discharge: 2020-03-20 | Disposition: A | Discharge status: Home / Self Care | Payer: Medicare Other | Attending: Cardiovascular Disease | Admitting: Cardiovascular Disease

## 2020-03-20 ENCOUNTER — Ambulatory Visit (HOSPITAL_COMMUNITY)
Admission: RE | Admit: 2020-03-20 | Discharge: 2020-03-20 | Disposition: A | Discharge status: Home / Self Care | Payer: Medicare Other | Source: Ambulatory Visit | Attending: Cardiovascular Disease | Admitting: Cardiovascular Disease

## 2020-03-20 DIAGNOSIS — R079 Chest pain, unspecified: Secondary | ICD-10-CM | POA: Diagnosis not present

## 2020-03-20 LAB — NM MYOCAR MULTI W/SPECT W/WALL MOTION / EF
LV dias vol: 45 mL (ref 62–150)
LV sys vol: 38 mL
Peak HR: 100 {beats}/min
RATE: 0.39
Rest HR: 58 {beats}/min
SDS: 2
SRS: 3
SSS: 5
TID: 0.8

## 2020-03-20 MED ORDER — REGADENOSON 0.4 MG/5ML IV SOLN
INTRAVENOUS | Status: AC
  Administered 2020-03-20: 0.4 mg via INTRAVENOUS
  Filled 2020-03-20: qty 5

## 2020-03-20 MED ORDER — TECHNETIUM TC 99M TETROFOSMIN IV KIT
10.0000 | PACK | Freq: Once | INTRAVENOUS | Status: AC
  Administered 2020-03-20: 11 via INTRAVENOUS

## 2020-03-20 MED ORDER — TECHNETIUM TC 99M TETROFOSMIN IV KIT
30.0000 | PACK | Freq: Once | INTRAVENOUS | Status: AC | PRN
  Administered 2020-03-20: 31 via INTRAVENOUS

## 2020-03-20 MED ORDER — SODIUM CHLORIDE FLUSH 0.9 % IV SOLN
INTRAVENOUS | Status: AC
  Administered 2020-03-20: 10 mL via INTRAVENOUS
  Filled 2020-03-20: qty 10

## 2020-03-24 ENCOUNTER — Telehealth: Payer: Self-pay

## 2020-03-24 DIAGNOSIS — R9439 Abnormal result of other cardiovascular function study: Secondary | ICD-10-CM

## 2020-03-24 NOTE — Telephone Encounter (Signed)
I spoke with Nurse Vickie at the [facility]I gave her nuclear test results and we will call her back to schedule echocardiogram for patient

## 2020-03-24 NOTE — Telephone Encounter (Signed)
-----   Message from Herminio Commons, MD sent at 03/20/2020  2:40 PM EDT ----- Evidence of possible previous heart attack with small area of blockage.  Please obtain an echocardiogram to confirm LV function.

## 2020-03-26 ENCOUNTER — Non-Acute Institutional Stay (SKILLED_NURSING_FACILITY): Payer: Medicare Other | Admitting: Adult Health

## 2020-03-26 ENCOUNTER — Encounter: Payer: Self-pay | Admitting: Adult Health

## 2020-03-26 DIAGNOSIS — G4733 Obstructive sleep apnea (adult) (pediatric): Secondary | ICD-10-CM

## 2020-03-26 DIAGNOSIS — M1A09X0 Idiopathic chronic gout, multiple sites, without tophus (tophi): Secondary | ICD-10-CM | POA: Diagnosis not present

## 2020-03-26 DIAGNOSIS — I10 Essential (primary) hypertension: Secondary | ICD-10-CM | POA: Diagnosis not present

## 2020-03-26 DIAGNOSIS — Z9989 Dependence on other enabling machines and devices: Secondary | ICD-10-CM

## 2020-03-26 NOTE — Progress Notes (Signed)
Location:    [place] Room Number: 127/D Place of Service:  SNF (31)   CODE STATUS: DNR  Allergies  Allergen Reactions  . Other   . Penicillin G   . Sulfonic Acid (3,5-Dibromo-4-H-Ox-Benz)   . Penicillins Rash  . Sulfonamide Derivatives Rash  . Tetanus Toxoid Rash    Chief Complaint  Patient presents with  . 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 There are no reports of agitation; no uncontrolled pain; no changes in appetite. He is awaiting surgical clearance for his cervical spine surgery.   Past Medical History:  Diagnosis Date  . Cancer (HCC)    melanoma-left side  . Chronic pain    legs and feet  . Coronary artery disease   . Dementia ([place])   . Diabetes mellitus without complication ([place])   . Foot drop   . GERD (gastroesophageal reflux disease)   . Gout   . Hyperlipidemia   . Hypertension   . OCD (obsessive compulsive disorder)   . PONV (postoperative nausea and vomiting)   . Psychosis ([place])   . Reflux   . Sarcoidosis   . Sleep apnea    cpap    Past Surgical History:  Procedure Laterality Date  . BACK SURGERY     cervical neck  . CATARACT EXTRACTION W/PHACO  01/05/2012   Procedure: CATARACT EXTRACTION PHACO AND INTRAOCULAR LENS PLACEMENT (IOC);  Surgeon: [name] Branch, MD;  Location: [facility];  Service: Ophthalmology;  Laterality: Left;  CDE 18.47  . CATARACT EXTRACTION W/PHACO  02/02/2012   Procedure: CATARACT EXTRACTION PHACO AND INTRAOCULAR LENS PLACEMENT (IOC);  Surgeon: [name] Branch, MD;  Location: [facility];  Service: Ophthalmology;  Laterality: Right;  CDE:15.38  . CHOLECYSTECTOMY    . GALLBLADDER SURGERY    . hemorhoidectomy    . MELANOMA EXCISION     left  side-[name]  . TONSILLECTOMY      Social History   Socioeconomic History  . Marital status: Married    Spouse name: Not on file  . Number of children: Not on file  . Years of education: Not on file  . Highest education level: Not on file  Occupational History  . Occupation: retired   Tobacco Use  . Smoking status: Former Smoker    Packs/day: 0.25    Years: 20.00    Pack years: 5.00    Types: Pipe    Quit date: 01/02/1974    Years since quitting: 46.2  . Smokeless tobacco: Never Used  Substance and Sexual Activity  . Alcohol use: No  . Drug use: No  . Sexual activity: Not Currently  Other Topics Concern  . Not on file  Social History Narrative   Former pipe smoker, quit many years ago.   [facility] term resident of [facility]      Social Determinants of Health   Financial Resource Strain: Low Risk   . Difficulty of Paying Living Expenses: Not hard at all  Food Insecurity: No Food Insecurity  . Worried About Charity fundraiser in the Last Year: Never true  . Ran Out of Food in the Last Year: Never true  Transportation Needs: No Transportation Needs  .  Lack of Transportation (Medical): No  . Lack of Transportation (Non-Medical): No  Physical Activity: Inactive  . Days of Exercise per Week: 0 days  . Minutes of Exercise per Session: 0 min  Stress: No Stress Concern Present  . Feeling of Stress : Not at all  Social Connections: Moderately Isolated  . Frequency of Communication with Friends and Family: Never  . Frequency of Social Gatherings with Friends and Family: Never  . Attends Religious Services: Never  . Active Member of Clubs or Organizations: No  . Attends Archivist Meetings: Not asked  . Marital Status: Married  Human resources officer Violence: Not At Risk  . Fear of Current or Ex-Partner: No  . Emotionally Abused: No  . Physically Abused: No  . Sexually Abused: No   Family History  Problem Relation Age of Onset  . Heart disease Other   . Arthritis Other    . Cancer Other   . Anesthesia problems Neg Hx   . Hypotension Neg Hx   . Malignant hyperthermia Neg Hx   . Pseudochol deficiency Neg Hx       VITAL SIGNS BP 132/88   Pulse 100   Temp 98.2 F (36.8 C) (Oral)   Resp 16   Ht 5\' 9"  (1.753 m)   Wt 181 lb 6.4 oz (82.3 kg)   SpO2 97%   BMI 26.79 kg/m   Outpatient Encounter Medications as of 03/26/2020  Medication Sig  . acetaminophen (TYLENOL) 500 MG tablet Take 500 mg by mouth every 6 (six) hours as needed. For pain   . albuterol (VENTOLIN HFA) 108 (90 Base) MCG/ACT inhaler Inhale 2 puffs into the lungs every 4 (four) hours as needed for wheezing or shortness of breath.  . allopurinol (ZYLOPRIM) 300 MG tablet Take 300 mg by mouth daily.    [name] (NORVASC) 5 MG tablet Take 1 tablet (5 mg total) by mouth daily.  [name] EC 81 MG tablet Take 81 mg by mouth daily.  [name] (LIPITOR) 20 MG tablet Take 1 tablet (20 mg total) by mouth daily.  Roseanne Kaufman Peru-Castor Oil (VENELEX) OINT Apply to sacrum, coccyx, and bilateral buttocks each shift and prn   . Balsam Peru-Castor Oil (VENELEX) OINT Apply topically. apply to the buttocks q shift and PRN  . donepezil (ARICEPT) 10 MG tablet Take 10 mg by mouth at bedtime.   . feeding supplement, GLUCERNA SHAKE, (GLUCERNA SHAKE) LIQD Take 237 mLs by mouth 2 (two) times daily between meals.   . gabapentin (NEURONTIN) 100 MG capsule Take 100 mg by mouth at bedtime.  [name] (ZESTRIL) 5 MG tablet Take 1 tablet (5 mg total) by mouth daily.  . meloxicam (MOBIC) 15 MG tablet Take 15 mg by mouth daily.   . NON FORMULARY Diet: _____ Regular, __X____ NAS, _______Consistent Carbohydrate, _______NPO _____Other  . NON FORMULARY C-PAP from home, use home settings while sleeping At Bedtime 08:00 PM  clean CPAP mask and tubing w/baby shampoorinse well w/warm water weekly and PRN Once A Day on Tue 03:15 PM - 11:15 PM  . pantoprazole (PROTONIX) 40 MG tablet Take 1 tablet (40 mg total) by  mouth daily.  . Vitamins A & D (VITAMIN A & D) ointment Apply A&D ointment to right foot and ankle qshift & prn for prevention. Every Shift  . Vitamins A & D (VITAMIN A & D) ointment Apply A&D ointment to left heel qshift & prn for prevention.  . [DISCONTINUED] NON FORMULARY Diet: Regular,NAS, Consistent  Carbohydrate   No facility-administered encounter medications on file as of 03/26/2020.     SIGNIFICANT DIAGNOSTIC EXAMS   PREVIOUS;   06-19-19: chest x-Lean: Minimal right basilar subsegmental atelectasis   06-19-19: ct of head: Mild diffuse cortical atrophy. No acute intracranial abnormality seen.  06-19-19: ct of lumbar spine:  1. No acute/traumatic lumbar spine pathology. 2. Osteopenia with extensive multilevel degenerative changes of the spine. 3. Lumbar levoscoliosis and multilevel disc bulge and neural foraminal narrowing. MRI may provide better evaluation. Aortic Atherosclerosis   06-22-19: lumbar spine x-Tomislav: 1. Degenerative changes. 2. No evidence for acute abnormality   12-17-19: CT of head and cervical spine:  1. No acute intracranial pathology. Small-vessel white matter disease. 2. No fracture or static subluxation of the cervical spine. 3. Anterior cervical discectomy and fusion of C4 through C6, with incorporation of the disc spaces and bony ankylosis of the included levels inferior to this through T3. 4. Prominent osteophytes, disc calcifications and calcifications of the ligamentum flavum, which appear to significantly narrow the cervical canal at C3-C4, minimum AP diameter approximately 4 mm.  02-13-20: MRI lumbar spine:  1. L2 fracture involving the anterior wall and superior endplate, likely subacute. The appearance suggest a hyperextension mechanism. No height loss. 2. L3-L4 severe spinal canal stenosis with severe right and moderate left neural foraminal stenosis. 3. T12-L1 moderate spinal canal stenosis and severe bilateral neural foraminal stenosis. 4. L1-L2  and L2-L3 severe right neural foraminal stenosis. 5. L5-S1 moderate bilateral neural foraminal stenosis.   02-13-20: MRI cervical spine:  1. 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 2. C4-6 ACDF without spinal canal stenosis. 3. Mild bilateral C6-7 neural foraminal stenosis.  TODAY  03-20-20: Myoview:  No diagnostic ST segment changes to indicate ischemia. Small, moderate intensity, apical septal and apical to basal inferolateral defects. The inferolateral defect is fixed and consistent with possible scar and the apical septal defect is partially reversible consistent with a mild ischemic territory.This is a high risk study based on calculated LVEF. Would suggest confirmatory echocardiogram. Nuclear stress EF: 15%   LABS REVIEWED PREVIOUS;   06-19-19: wbc 8.4; hgb 14.2; hct 44.4; mcv 93.7 plt 317; glucose 106; bun 26; creat 0.82; k+ 4.1; na++ 138; ca 8.8; liver normal albumin 3.4; urine culture no growth 06-20-19-: wbc 7.7; hgb 13.1 hct 42.2; mcv 94.8; plt 306; glucose 108; bun 24; creat 0.80; k+ 3.9; na++ 138; ca 8.8 ;liver normal albumin 3.3  08-23-19: hgb a1c 5.6  10-05-19: wbc 4.8; hgb 11.8; hct 36.2; mcv 89.8 plt 256; glucose 100; bun 18; creat 0.53 ;k+ 3.9; an++ 128; ca 8.6; d-dimer: 3.36 ferritin 965 10-16-19: glucose 96; bun 14; creat 0.47; k+ 3.7; na++ 134; ca 8.5 12-02-19: wbc 5.9; hgb 12.4; hct 38.9; mcv 91.1 plt 227; chol 96; ldl 43; trig 45; hdl 44   TODAY  01-30-20: chol 96; ldl 43; trig 45; hdl 44     Review of Systems  Constitutional: Negative for malaise/fatigue.  Respiratory: Negative for cough and shortness of breath.   Cardiovascular: Negative for chest pain, palpitations and leg swelling.  Gastrointestinal: Negative for abdominal pain, constipation and heartburn.  Musculoskeletal: Negative for back pain, joint pain and myalgias.  Skin: Negative.     Neurological: Negative for dizziness.  Psychiatric/Behavioral: The patient is not nervous/anxious.     Physical Exam Constitutional:      General: He is not in acute distress.  Appearance: He is well-developed. He is not diaphoretic.  Neck:     Thyroid: No thyromegaly.  Cardiovascular:     Rate and Rhythm: Normal rate and regular rhythm.     Pulses: Normal pulses.     Heart sounds: Normal heart sounds.  Pulmonary:     Effort: Pulmonary effort is normal. No respiratory distress.     Breath sounds: Normal breath sounds.  Abdominal:     General: Bowel sounds are normal. There is no distension.     Palpations: Abdomen is soft.     Tenderness: There is no abdominal tenderness.  Musculoskeletal:     Cervical back: Neck supple.     Right lower leg: No edema.     Left lower leg: No edema.     Comments: Able to move all extremities   Lymphadenopathy:     Cervical: No cervical adenopathy.  Skin:    General: Skin is warm and dry.  Neurological:     Mental Status: He is alert and oriented to person, place, and time.  Psychiatric:        Mood and Affect: Mood normal.     ASSESSMENT/ PLAN:  TODAY  1. 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 Essential hypertension: is stable b/p 132/88 will continue norvasc 5 mg daily lisinopril 5 mg daily  3. Sleep apnea obstructive: is stable will decline CPAP at times.   PREVIOUS   4. GERD without esophagitis: is stable will continue protonix 40 mg daily   5. 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 Degenerative disc disease lumbar/cervical disc disease: will continue mobic 15 mg daily and gabapentin 100 mg nightly   7. Dyslipidemia: is stable LDL 43 will continue lipitor 20 mg daily         MD is aware of resident's narcotic use and is in agreement  with current plan of care. We will attempt to wean resident as appropriate.  Ok Edwards NP [facility]  Contact [phone] Monday through Friday 8am- 5pm  After hours call [phone]

## 2020-04-02 ENCOUNTER — Non-Acute Institutional Stay (SKILLED_NURSING_FACILITY): Payer: Medicare Other | Admitting: Adult Health

## 2020-04-02 ENCOUNTER — Encounter: Payer: Self-pay | Admitting: Adult Health

## 2020-04-02 DIAGNOSIS — M5412 Radiculopathy, cervical region: Secondary | ICD-10-CM | POA: Diagnosis not present

## 2020-04-02 DIAGNOSIS — F0150 Vascular dementia without behavioral disturbance: Secondary | ICD-10-CM

## 2020-04-02 DIAGNOSIS — I10 Essential (primary) hypertension: Secondary | ICD-10-CM

## 2020-04-02 NOTE — Progress Notes (Signed)
Location:    [facility] Room Number: 127/D Place of Service:  SNF (31)   CODE STATUS: DNR  Allergies  Allergen Reactions  . Other   . Penicillin G   . Sulfonic Acid (3,5-Dibromo-4-H-Ox-Benz)   . Penicillins Rash  . Sulfonamide Derivatives Rash  . Tetanus Toxoid Rash    Chief Complaint  Patient presents with  . Acute Visit    Care Plan Meeting    HPI:  We have come together for his care plan meeting. BIMS 9/15 mood 2/30. His weight is stable. He was lowered to the floor one time. He requires extensive to dependence for his adls. Is incontinent of bladder and bowel. Cardiology pending for clearance for his cervical spine surgery. He continues to be followed for his chronic illnesses including: Vascular dementia without behavioral disturbance Radiculopathy of cervical spin . Essential hypertension    Past Medical History:  Diagnosis Date  . Cancer (HCC)    melanoma-left side  . Chronic pain    legs and feet  . Coronary artery disease   . Dementia ([place])   . Diabetes mellitus without complication ([place])   . Foot drop   . GERD (gastroesophageal reflux disease)   . Gout   . Hyperlipidemia   . Hypertension   . OCD (obsessive compulsive disorder)   . PONV (postoperative nausea and vomiting)   . Psychosis ([place])   . Reflux   . Sarcoidosis   . Sleep apnea    cpap    Past Surgical History:  Procedure Laterality Date  . BACK SURGERY     cervical neck  . CATARACT EXTRACTION W/PHACO  01/05/2012   Procedure: CATARACT EXTRACTION PHACO AND INTRAOCULAR LENS PLACEMENT (IOC);  Surgeon: [name] Branch, MD;  Location: [facility];  Service: Ophthalmology;  Laterality: Left;  CDE 18.47  . CATARACT EXTRACTION W/PHACO  02/02/2012   Procedure: CATARACT EXTRACTION PHACO AND INTRAOCULAR LENS PLACEMENT (IOC);  Surgeon: [name] Branch, MD;  Location: [facility];  Service: Ophthalmology;  Laterality: Right;  CDE:15.38  . CHOLECYSTECTOMY    . GALLBLADDER SURGERY    . hemorhoidectomy      . MELANOMA EXCISION     left side-[name]  . TONSILLECTOMY      Social History   Socioeconomic History  . Marital status: Married    Spouse name: Not on file  . Number of children: Not on file  . Years of education: Not on file  . Highest education level: Not on file  Occupational History  . Occupation: retired   Tobacco Use  . Smoking status: Former Smoker    Packs/day: 0.25    Years: 20.00    Pack years: 5.00    Types: Pipe    Quit date: 01/02/1974    Years since quitting: 46.2  . Smokeless tobacco: Never Used  Substance and Sexual Activity  . Alcohol use: No  . Drug use: No  . Sexual activity: Not Currently  Other Topics Concern  . Not on file  Social History Narrative   Former pipe smoker, quit many years ago.   Long term resident of [facility]      Social Determinants of Health   Financial Resource Strain: Low Risk   . Difficulty of Paying Living Expenses: Not hard at all  Food Insecurity: No Food Insecurity  . Worried About Charity fundraiser in the Last Year: Never true  . Ran Out of Food in the Last Year: Never true  Transportation Needs: No Transportation  Needs  . Lack of Transportation (Medical): No  . Lack of Transportation (Non-Medical): No  Physical Activity: Inactive  . Days of Exercise per Week: 0 days  . Minutes of Exercise per Session: 0 min  Stress: No Stress Concern Present  . Feeling of Stress : Not at all  Social Connections: Moderately Isolated  . Frequency of Communication with Friends and Family: Never  . Frequency of Social Gatherings with Friends and Family: Never  . Attends Religious Services: Never  . Active Member of Clubs or Organizations: No  . Attends Archivist Meetings: Not asked  . Marital Status: Married  Human resources officer Violence: Not At Risk  . Fear of Current or Ex-Partner: No  . Emotionally Abused: No  . Physically Abused: No  . Sexually Abused: No   Family History  Problem Relation Age of Onset  . Heart  disease Other   . Arthritis Other   . Cancer Other   . Anesthesia problems Neg Hx   . Hypotension Neg Hx   . Malignant hyperthermia Neg Hx   . Pseudochol deficiency Neg Hx       VITAL SIGNS BP 129/74   Pulse 68   Temp 98.2 F (36.8 C) (Oral)   Resp 16   Ht 5\' 9"  (1.753 m)   Wt 186 lb 9.6 oz (84.6 kg)   SpO2 97%   BMI 27.56 kg/m   Outpatient Encounter Medications as of 04/02/2020  Medication Sig  . acetaminophen (TYLENOL) 500 MG tablet Take 500 mg by mouth every 6 (six) hours as needed. For pain   . albuterol (VENTOLIN HFA) 108 (90 Base) MCG/ACT inhaler Inhale 2 puffs into the lungs every 4 (four) hours as needed for wheezing or shortness of breath.  . allopurinol (ZYLOPRIM) 300 MG tablet Take 300 mg by mouth daily.    [name] (NORVASC) 5 MG tablet Take 1 tablet (5 mg total) by mouth daily.  [name] EC 81 MG tablet Take 81 mg by mouth daily.  [name] (LIPITOR) 20 MG tablet Take 1 tablet (20 mg total) by mouth daily.  Roseanne Kaufman Peru-Castor Oil (VENELEX) OINT Apply to sacrum, coccyx, and bilateral buttocks each shift and prn   . Balsam Peru-Castor Oil (VENELEX) OINT Apply topically. apply to the buttocks q shift and PRN  . donepezil (ARICEPT) 10 MG tablet Take 10 mg by mouth at bedtime.   . feeding supplement, GLUCERNA SHAKE, (GLUCERNA SHAKE) LIQD Take 237 mLs by mouth 2 (two) times daily between meals.   . gabapentin (NEURONTIN) 100 MG capsule Take 100 mg by mouth at bedtime.  [name] (ZESTRIL) 5 MG tablet Take 1 tablet (5 mg total) by mouth daily.  . meloxicam (MOBIC) 15 MG tablet Take 15 mg by mouth daily.   . NON FORMULARY Diet: _____ Regular, __X____ NAS, _______Consistent Carbohydrate, _______NPO _____Other  . NON FORMULARY C-PAP from home, use home settings while sleeping At Bedtime 08:00 PM  clean CPAP mask and tubing w/baby shampoorinse well w/warm water weekly and PRN Once A Day on Tue 03:15 PM - 11:15 PM  . pantoprazole (PROTONIX) 40 MG tablet  Take 1 tablet (40 mg total) by mouth daily.  . Vitamins A & D (VITAMIN A & D) ointment Apply A&D ointment to right foot and ankle qshift & prn for prevention. Every Shift  . Vitamins A & D (VITAMIN A & D) ointment Apply A&D ointment to left heel qshift & prn for prevention.   No facility-administered encounter  medications on file as of 04/02/2020.     SIGNIFICANT DIAGNOSTIC EXAMS   PREVIOUS;   06-19-19: chest x-[place]ri[place]: Minimal right basilar subsegmental atelectasis   06-19-19: ct of head: Mild diffuse cortical atrophy. No acute intracranial abnormality seen.  06-19-19: ct of lumbar spine:  1. No acute/traumatic lumbar spine pathology. 2. Osteopenia with extensive multilevel degenerative changes of the spine. 3. Lumbar levoscoliosis and multilevel disc bulge and neural foraminal narrowing. MRI may provide better evaluation. Aortic Atherosclerosis   06-22-19: lumbar spine x-Li: 1. Degenerative changes. 2. No evidence for acute abnormality   12-17-19: CT of head and cervical spine:  1. No acute intracranial pathology. Small-vessel white matter disease. 2. No fracture or static subluxation of the cervical spine. 3. Anterior cervical discectomy and fusion of C4 through C6, with incorporation of the disc spaces and bony ankylosis of the included levels inferior to this through T3. 4. Prominent osteophytes, disc calcifications and calcifications of the ligamentum flavum, which appear to significantly narrow the cervical canal at C3-C4, minimum AP diameter approximately 4 mm.  02-13-20: MRI lumbar spine:  1. L2 fracture involving the anterior wall and superior endplate, likely subacute. The appearance suggest a hyperextension mechanism. No height loss. 2. L3-L4 severe spinal canal stenosis with severe right and moderate left neural foraminal stenosis. 3. T12-L1 moderate spinal canal stenosis and severe bilateral neural foraminal stenosis. 4. L1-L2 and L2-L3 severe right neural foraminal  stenosis. 5. L5-S1 moderate bilateral neural foraminal stenosis.   02-13-20: MRI cervical spine:  1. 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 2. C4-6 ACDF without spinal canal stenosis. 3. Mild bilateral C6-7 neural foraminal stenosis.  03-20-20: Myoview:  No diagnostic ST segment changes to indicate ischemia. Small, moderate intensity, apical septal and apical to basal inferolateral defects. The inferolateral defect is fixed and consistent with possible scar and the apical septal defect is partially reversible consistent with a mild ischemic territory.This is a high risk study based on calculated LVEF. Would suggest confirmatory echocardiogram. Nuclear stress EF: 15%  NO NEW EXAMS.    LABS REVIEWED PREVIOUS;   06-19-19: wbc 8.4; hgb 14.2; hct 44.4; mcv 93.7 plt 317; glucose 106; bun 26; creat 0.82; k+ 4.1; na++ 138; ca 8.8; liver normal albumin 3.4; urine culture no growth 06-20-19-: wbc 7.7; hgb 13.1 hct 42.2; mcv 94.8; plt 306; glucose 108; bun 24; creat 0.80; k+ 3.9; na++ 138; ca 8.8 ;liver normal albumin 3.3  08-23-19: hgb a1c 5.6  10-05-19: wbc 4.8; hgb 11.8; hct 36.2; mcv 89.8 plt 256; glucose 100; bun 18; creat 0.53 ;k+ 3.9; an++ 128; ca 8.6; d-dimer: 3.36 ferritin 965 10-16-19: glucose 96; bun 14; creat 0.47; k+ 3.7; na++ 134; ca 8.5 12-02-19: wbc 5.9; hgb 12.4; hct 38.9; mcv 91.1 plt 227; chol 96; ldl 43; trig 45; hdl 44  01-30-20: chol 96; ldl 43; trig 45; hdl 44    NO NEW LABS.    Review of Systems  Constitutional: Negative for malaise/fatigue.  Respiratory: Negative for cough and shortness of breath.   Cardiovascular: Negative for chest pain, palpitations and leg swelling.  Gastrointestinal: Negative for abdominal pain, constipation and heartburn.  Musculoskeletal: Negative for back pain, joint pain and myalgias.  Skin: Negative.   Neurological: Negative for  dizziness.  Psychiatric/Behavioral: The patient is not nervous/anxious.     Physical Exam Constitutional:      General: He is not in acute distress.  Appearance: He is well-developed. He is not diaphoretic.  Neck:     Thyroid: No thyromegaly.  Cardiovascular:     Rate and Rhythm: Normal rate and regular rhythm.     Pulses: Normal pulses.     Heart sounds: Normal heart sounds.  Pulmonary:     Effort: Pulmonary effort is normal. No respiratory distress.     Breath sounds: Normal breath sounds.  Abdominal:     General: Bowel sounds are normal. There is no distension.     Palpations: Abdomen is soft.     Tenderness: There is no abdominal tenderness.  Musculoskeletal:     Cervical back: Neck supple.     Right lower leg: No edema.     Left lower leg: No edema.     Comments: Able to move all extremities   Lymphadenopathy:     Cervical: No cervical adenopathy.  Skin:    General: Skin is warm and dry.  Neurological:     Mental Status: He is alert. Mental status is at baseline.  Psychiatric:        Mood and Affect: Mood normal.      ASSESSMENT/ PLAN:  TODAY  1. Vascular dementia without behavioral disturbance 2. Radiculopathy of cervical spine 3. Essential hypertension  Will continue current medications Will continue current plan of care Will continue to monitor her status.   MD is aware of resident's narcotic use and is in agreement with current plan of care. We will attempt to wean resident as appropriate.  Ok Edwards NP [facility]  Contact [phone] Monday through Friday 8am- 5pm  After hours call [phone]

## 2020-04-10 ENCOUNTER — Ambulatory Visit (HOSPITAL_COMMUNITY)
Admission: RE | Admit: 2020-04-10 | Discharge: 2020-04-10 | Disposition: A | Discharge status: Home / Self Care | Payer: Medicare Other | Source: Ambulatory Visit | Attending: Cardiovascular Disease | Admitting: Cardiovascular Disease

## 2020-04-10 DIAGNOSIS — Z8616 Personal history of COVID-19: Secondary | ICD-10-CM | POA: Insufficient documentation

## 2020-04-10 DIAGNOSIS — Z87891 Personal history of nicotine dependence: Secondary | ICD-10-CM | POA: Diagnosis not present

## 2020-04-10 DIAGNOSIS — E785 Hyperlipidemia, unspecified: Secondary | ICD-10-CM | POA: Diagnosis not present

## 2020-04-10 DIAGNOSIS — I119 Hypertensive heart disease without heart failure: Secondary | ICD-10-CM | POA: Diagnosis not present

## 2020-04-10 DIAGNOSIS — I2510 Atherosclerotic heart disease of native coronary artery without angina pectoris: Secondary | ICD-10-CM | POA: Diagnosis not present

## 2020-04-10 DIAGNOSIS — I358 Other nonrheumatic aortic valve disorders: Secondary | ICD-10-CM | POA: Diagnosis not present

## 2020-04-10 DIAGNOSIS — M5412 Radiculopathy, cervical region: Secondary | ICD-10-CM | POA: Insufficient documentation

## 2020-04-10 DIAGNOSIS — R9439 Abnormal result of other cardiovascular function study: Secondary | ICD-10-CM

## 2020-04-10 NOTE — Progress Notes (Signed)
*  PRELIMINARY RESULTS* Echocardiogram 2D Echocardiogram has been performed.  Leavy Cella 04/10/2020, 4:12 PM

## 2020-04-11 LAB — ECHOCARDIOGRAM COMPLETE
Height: 69 in
Weight: 2985.6 oz

## 2020-04-22 ENCOUNTER — Non-Acute Institutional Stay (SKILLED_NURSING_FACILITY): Payer: Medicare Other | Admitting: Adult Health

## 2020-04-22 ENCOUNTER — Encounter: Payer: Self-pay | Admitting: Adult Health

## 2020-04-22 DIAGNOSIS — M5136 Other intervertebral disc degeneration, lumbar region: Secondary | ICD-10-CM

## 2020-04-22 DIAGNOSIS — K219 Gastro-esophageal reflux disease without esophagitis: Secondary | ICD-10-CM | POA: Diagnosis not present

## 2020-04-22 DIAGNOSIS — F0150 Vascular dementia without behavioral disturbance: Secondary | ICD-10-CM | POA: Diagnosis not present

## 2020-04-22 NOTE — Progress Notes (Signed)
Location:    [place] Room Number: 127/D Place of Service:  SNF (31)   CODE STATUS: DNR  Allergies  Allergen Reactions  . Other   . Penicillin G   . Sulfonic Acid (3,5-Dibromo-4-H-Ox-Benz)   . Penicillins Rash  . Sulfonamide Derivatives Rash  . Tetanus Toxoid Rash    Chief Complaint  Patient presents with  . Medical Management of Chronic Issues         GERD without esophagitis:   Vascular dementia without behavioral disturbance:     Degenerative disc disease lumbar/cervical disc disease    HPI:  He is a 86 year old long term resident of this facility being seen for the management of his chronic illnesses; gerd; dementia; degenerative disc disease. There are no reports of uncontrolled pain; no changes in appetite; no reports of anxiety or agitation.   Past Medical History:  Diagnosis Date  . Cancer (HCC)    melanoma-left side  . Chronic pain    legs and feet  . Coronary artery disease   . Dementia ([place])   . Diabetes mellitus without complication ([place])   . Foot drop   . GERD (gastroesophageal reflux disease)   . Gout   . Hyperlipidemia   . Hypertension   . OCD (obsessive compulsive disorder)   . PONV (postoperative nausea and vomiting)   . Psychosis ([place])   . Reflux   . Sarcoidosis   . Sleep apnea    cpap    Past Surgical History:  Procedure Laterality Date  . BACK SURGERY     cervical neck  . CATARACT EXTRACTION W/PHACO  01/05/2012   Procedure: CATARACT EXTRACTION PHACO AND INTRAOCULAR LENS PLACEMENT (IOC);  Surgeon: [name] Branch, MD;  Location: [facility];  Service: Ophthalmology;  Laterality: Left;  CDE 18.47  . CATARACT EXTRACTION W/PHACO  02/02/2012   Procedure: CATARACT EXTRACTION PHACO AND INTRAOCULAR LENS PLACEMENT (IOC);  Surgeon: [name] Branch, MD;  Location: [facility];  Service: Ophthalmology;  Laterality: Right;  CDE:15.38  . CHOLECYSTECTOMY    . GALLBLADDER SURGERY    . hemorhoidectomy    . MELANOMA EXCISION     left side-[name]    . TONSILLECTOMY      Social History   Socioeconomic History  . Marital status: Married    Spouse name: Not on file  . Number of children: Not on file  . Years of education: Not on file  . Highest education level: Not on file  Occupational History  . Occupation: retired   Tobacco Use  . Smoking status: Former Smoker    Packs/day: 0.25    Years: 20.00    Pack years: 5.00    Types: Pipe    Quit date: 01/02/1974    Years since quitting: 46.3  . Smokeless tobacco: Never Used  Vaping Use  . Vaping Use: Never used  Substance and Sexual Activity  . Alcohol use: No  . Drug use: No  . Sexual activity: Not Currently  Other Topics Concern  . Not on file  Social History Narrative   Former pipe smoker, quit many years ago.   Long term resident of [facility]      Social Determinants of Health   Financial Resource Strain: Low Risk   . Difficulty of Paying Living Expenses: Not hard at all  Food Insecurity: No Food Insecurity  . Worried About Charity fundraiser in the Last Year: Never true  . Ran Out of Food in the Last Year: Never  true  Transportation Needs: No Transportation Needs  . Lack of Transportation (Medical): No  . Lack of Transportation (Non-Medical): No  Physical Activity: Inactive  . Days of Exercise per Week: 0 days  . Minutes of Exercise per Session: 0 min  Stress: No Stress Concern Present  . Feeling of Stress : Not at all  Social Connections: Socially Isolated  . Frequency of Communication with Friends and Family: Never  . Frequency of Social Gatherings with Friends and Family: Never  . Attends Religious Services: Never  . Active Member of Clubs or Organizations: No  . Attends Archivist Meetings: Not asked  . Marital Status: Married  Human resources officer Violence: Not At Risk  . Fear of Current or Ex-Partner: No  . Emotionally Abused: No  . Physically Abused: No  . Sexually Abused: No   Family History  Problem Relation Age of Onset  . Heart disease  Other   . Arthritis Other   . Cancer Other   . Anesthesia problems Neg Hx   . Hypotension Neg Hx   . Malignant hyperthermia Neg Hx   . Pseudochol deficiency Neg Hx       VITAL SIGNS BP 112/71   Pulse 88   Temp 98.1 F (36.7 C) (Oral)   Resp 20   Ht 5\' 9"  (1.753 m)   Wt 186 lb 9.6 oz (84.6 kg)   BMI 27.56 kg/m   Outpatient Encounter Medications as of 04/22/2020  Medication Sig  . acetaminophen (TYLENOL) 500 MG tablet Take 500 mg by mouth every 6 (six) hours as needed. For pain   . albuterol (VENTOLIN HFA) 108 (90 Base) MCG/ACT inhaler Inhale 2 puffs into the lungs every 4 (four) hours as needed for wheezing or shortness of breath.  . allopurinol (ZYLOPRIM) 300 MG tablet Take 300 mg by mouth daily.    [name] (NORVASC) 5 MG tablet Take 1 tablet (5 mg total) by mouth daily.  [name] EC 81 MG tablet Take 81 mg by mouth daily.  [name] (LIPITOR) 20 MG tablet Take 1 tablet (20 mg total) by mouth daily.  Roseanne Kaufman Peru-Castor Oil (VENELEX) OINT Apply to sacrum, coccyx, and bilateral buttocks each shift and prn   . Balsam Peru-Castor Oil (VENELEX) OINT Apply topically. apply to the buttocks q shift and PRN  . donepezil (ARICEPT) 10 MG tablet Take 10 mg by mouth at bedtime.   . feeding supplement, GLUCERNA SHAKE, (GLUCERNA SHAKE) LIQD Take 237 mLs by mouth 2 (two) times daily between meals.   . gabapentin (NEURONTIN) 100 MG capsule Take 100 mg by mouth at bedtime.  [name] (ZESTRIL) 5 MG tablet Take 1 tablet (5 mg total) by mouth daily.  . meloxicam (MOBIC) 15 MG tablet Take 15 mg by mouth daily.   . NON FORMULARY Diet: _____ Regular, __X____ NAS, _______Consistent Carbohydrate, _______NPO _____Other  . NON FORMULARY C-PAP from home, use home settings while sleeping At Bedtime 08:00 PM  clean CPAP mask and tubing w/baby shampoorinse well w/warm water weekly and PRN Once A Day on Tue 03:15 PM - 11:15 PM  . pantoprazole (PROTONIX) 40 MG tablet Take 1 tablet (40 mg  total) by mouth daily.  . Vitamins A & D (VITAMIN A & D) ointment Apply A&D ointment to right foot and ankle qshift & prn for prevention. Every Shift  . Vitamins A & D (VITAMIN A & D) ointment Apply A&D ointment to left heel qshift & prn for prevention.   No  facility-administered encounter medications on file as of 04/22/2020.     SIGNIFICANT DIAGNOSTIC EXAMS   PREVIOUS;   06-19-19: chest x-[place]: Minimal right basilar subsegmental atelectasis   06-19-19: ct of head: Mild diffuse cortical atrophy. No acute intracranial abnormality seen.  06-19-19: ct of lumbar spine:  1. No acute/traumatic lumbar spine pathology. 2. Osteopenia with extensive multilevel degenerative changes of the spine. 3. Lumbar levoscoliosis and multilevel disc bulge and neural foraminal narrowing. MRI may provide better evaluation. Aortic Atherosclerosis   06-22-19: lumbar spine x-[place]: 1. Degenerative changes. 2. No evidence for acute abnormality   12-17-19: CT of head and cervical spine:  1. No acute intracranial pathology. Small-vessel white matter disease. 2. No fracture or static subluxation of the cervical spine. 3. Anterior cervical discectomy and fusion of C4 through C6, with incorporation of the disc spaces and bony ankylosis of the included levels inferior to this through T3. 4. Prominent osteophytes, disc calcifications and calcifications of the ligamentum flavum, which appear to significantly narrow the cervical canal at C3-C4, minimum AP diameter approximately 4 mm.  02-13-20: MRI lumbar spine:  1. L2 fracture involving the anterior wall and superior endplate, likely subacute. The appearance suggest a hyperextension mechanism. No height loss. 2. L3-L4 severe spinal canal stenosis with severe right and moderate left neural foraminal stenosis. 3. T12-L1 moderate spinal canal stenosis and severe bilateral neural foraminal stenosis. 4. L1-L2 and L2-L3 severe right neural foraminal stenosis. 5. L5-S1  moderate bilateral neural foraminal stenosis.   02-13-20: MRI cervical spine:  1. 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 2. C4-6 ACDF without spinal canal stenosis. 3. Mild bilateral C6-7 neural foraminal stenosis.  03-20-20: Myoview:  No diagnostic ST segment changes to indicate ischemia. Small, moderate intensity, apical septal and apical to basal inferolateral defects. The inferolateral defect is fixed and consistent with possible scar and the apical septal defect is partially reversible consistent with a mild ischemic territory.This is a high risk study based on calculated LVEF. Would suggest confirmatory echocardiogram. Nuclear stress EF: 15%  TODAY  04-10-20 2-d echo:  Left ventricular ejection fraction, by estimation, is 55 to 60%. The  left ventricle has normal function. The left ventricle has no regional  wall motion abnormalities. Left ventricular diastolic parameters are  consistent with age-related delayed  relaxation (normal).     LABS REVIEWED PREVIOUS;   06-19-19: wbc 8.4; hgb 14.2; hct 44.4; mcv 93.7 plt 317; glucose 106; bun 26; creat 0.82; k+ 4.1; na++ 138; ca 8.8; liver normal albumin 3.4; urine culture no growth 06-20-19-: wbc 7.7; hgb 13.1 hct 42.2; mcv 94.8; plt 306; glucose 108; bun 24; creat 0.80; k+ 3.9; na++ 138; ca 8.8 ;liver normal albumin 3.3  08-23-19: hgb a1c 5.6  10-05-19: wbc 4.8; hgb 11.8; hct 36.2; mcv 89.8 plt 256; glucose 100; bun 18; creat 0.53 ;k+ 3.9; an++ 128; ca 8.6; d-dimer: 3.36 ferritin 965 10-16-19: glucose 96; bun 14; creat 0.47; k+ 3.7; na++ 134; ca 8.5 12-02-19: wbc 5.9; hgb 12.4; hct 38.9; mcv 91.1 plt 227; chol 96; ldl 43; trig 45; hdl 44  01-30-20: chol 96; ldl 43; trig 45; hdl 44   NO NEW LABS.     Review of Systems  Constitutional: Negative for malaise/fatigue.  Respiratory: Negative for cough and shortness of breath.     Cardiovascular: Negative for chest pain, palpitations and leg swelling.  Gastrointestinal: Negative for abdominal pain, constipation and heartburn.  Musculoskeletal: Negative for back pain, joint pain and myalgias.  Skin: Negative.   Neurological: Negative for dizziness.  Psychiatric/Behavioral: The patient is not nervous/anxious.     Physical Exam Constitutional:      General: He is not in acute distress.    Appearance: He is well-developed. He is not diaphoretic.  Neck:     Thyroid: No thyromegaly.  Cardiovascular:     Rate and Rhythm: Normal rate and regular rhythm.     Pulses: Normal pulses.     Heart sounds: Normal heart sounds.  Pulmonary:     Effort: Pulmonary effort is normal. No respiratory distress.     Breath sounds: Normal breath sounds.  Abdominal:     General: Bowel sounds are normal. There is no distension.     Palpations: Abdomen is soft.     Tenderness: There is no abdominal tenderness.  Musculoskeletal:        General: Normal range of motion.     Cervical back: Neck supple.     Right lower leg: No edema.     Left lower leg: No edema.  Lymphadenopathy:     Cervical: No cervical adenopathy.  Skin:    General: Skin is warm and dry.  Neurological:     Mental Status: He is alert. Mental status is at baseline.  Psychiatric:        Mood and Affect: Mood normal.     ASSESSMENT/ PLAN:  TODAY  1. GERD without esophagitis: is stable will continue protonix 40 mg daily   2. Vascular dementia without behavioral disturbance: is without changes weight is 186 pounds will continue aricept 110 mg daily   3. Degenerative disc disease lumbar/cervical disc disease is without change will continue mobic 15 mg daily gabapentin 100 mg daily   PREVIOUS   4. Dyslipidemia: is stable LDL 43 will continue lipitor 20 mg daily   5. 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 Essential  hypertension: is stable b/p 112/71 will continue norvasc 5 mg daily lisinopril 5 mg daily  7. Sleep apnea obstructive: is stable will decline CPAP at times.        MD is aware of resident's narcotic use and is in agreement with current plan of care. We will attempt to wean resident as appropriate.  Eric Edwards NP [facility]  Contact [phone] Monday through Friday 8am- 5pm  After hours call [phone]

## 2020-05-25 ENCOUNTER — Encounter: Payer: Self-pay | Admitting: Adult Health

## 2020-05-25 ENCOUNTER — Non-Acute Institutional Stay (SKILLED_NURSING_FACILITY): Payer: Medicare Other | Admitting: Adult Health

## 2020-05-25 DIAGNOSIS — E785 Hyperlipidemia, unspecified: Secondary | ICD-10-CM | POA: Diagnosis not present

## 2020-05-25 DIAGNOSIS — M1A09X0 Idiopathic chronic gout, multiple sites, without tophus (tophi): Secondary | ICD-10-CM

## 2020-05-25 DIAGNOSIS — I10 Essential (primary) hypertension: Secondary | ICD-10-CM | POA: Diagnosis not present

## 2020-05-25 NOTE — Progress Notes (Signed)
Location:    [place] Room Number: 127/D Place of Service:  SNF (31)   CODE STATUS: DNR  Allergies  Allergen Reactions  . Other   . Penicillin G   . Sulfonic Acid (3,5-Dibromo-4-H-Ox-Benz)   . Penicillins Rash  . Sulfonamide Derivatives Rash  . Tetanus Toxoid Rash    Chief Complaint  Patient presents with  . Medical Management of Chronic Issues          Dyslipidemia:   Idiopathic chronic gout of multiple sites without tophi:   Essential hypertension    HPI:  He is a 86 year old long term resident of this facility being seen for the management of his chronic illnesses:  Dyslipidemia:    Idiopathic chronic gout of multiple sites without tophi:    Essential hypertension.  There are no reports of uncontrolled pain; no changes in appetite; no reports of constipation or heart burn.   Past Medical History:  Diagnosis Date  . Cancer (HCC)    melanoma-left side  . Chronic pain    legs and feet  . Coronary artery disease   . Dementia ([place])   . Diabetes mellitus without complication ([place])   . Foot drop   . GERD (gastroesophageal reflux disease)   . Gout   . Hyperlipidemia   . Hypertension   . OCD (obsessive compulsive disorder)   . PONV (postoperative nausea and vomiting)   . Psychosis ([place])   . Reflux   . Sarcoidosis   . Sleep apnea    cpap    Past Surgical History:  Procedure Laterality Date  . BACK SURGERY     cervical neck  . CATARACT EXTRACTION W/PHACO  01/05/2012   Procedure: CATARACT EXTRACTION PHACO AND INTRAOCULAR LENS PLACEMENT (IOC);  Surgeon: [name] Branch, MD;  Location: [facility];  Service: Ophthalmology;  Laterality: Left;  CDE 18.47  . CATARACT EXTRACTION W/PHACO  02/02/2012   Procedure: CATARACT EXTRACTION PHACO AND INTRAOCULAR LENS PLACEMENT (IOC);  Surgeon: [name] Branch, MD;  Location: [facility];  Service: Ophthalmology;  Laterality: Right;  CDE:15.38  . CHOLECYSTECTOMY    . GALLBLADDER SURGERY    . hemorhoidectomy    . MELANOMA EXCISION      left side-[name]  . TONSILLECTOMY      Social History   Socioeconomic History  . Marital status: Married    Spouse name: Not on file  . Number of children: Not on file  . Years of education: Not on file  . Highest education level: Not on file  Occupational History  . Occupation: retired   Tobacco Use  . Smoking status: Former Smoker    Packs/day: 0.25    Years: 20.00    Pack years: 5.00    Types: Pipe    Quit date: 01/02/1974    Years since quitting: 46.4  . Smokeless tobacco: Never Used  Vaping Use  . Vaping Use: Never used  Substance and Sexual Activity  . Alcohol use: No  . Drug use: No  . Sexual activity: Not Currently  Other Topics Concern  . Not on file  Social History Narrative   Former pipe smoker, quit many years ago.   Long term resident of [facility]      Social Determinants of Health   Financial Resource Strain: Low Risk   . Difficulty of Paying Living Expenses: Not hard at all  Food Insecurity: No Food Insecurity  . Worried About Charity fundraiser in the Last Year: Never true  .  Ran Out of Food in the Last Year: Never true  Transportation Needs: No Transportation Needs  . Lack of Transportation (Medical): No  . Lack of Transportation (Non-Medical): No  Physical Activity: Inactive  . Days of Exercise per Week: 0 days  . Minutes of Exercise per Session: 0 min  Stress: No Stress Concern Present  . Feeling of Stress : Not at all  Social Connections: Socially Isolated  . Frequency of Communication with Friends and Family: Never  . Frequency of Social Gatherings with Friends and Family: Never  . Attends Religious Services: Never  . Active Member of Clubs or Organizations: No  . Attends Archivist Meetings: Not asked  . Marital Status: Married  Human resources officer Violence: Not At Risk  . Fear of Current or Ex-Partner: No  . Emotionally Abused: No  . Physically Abused: No  . Sexually Abused: No   Family History  Problem Relation Age of  Onset  . Heart disease Other   . Arthritis Other   . Cancer Other   . Anesthesia problems Neg Hx   . Hypotension Neg Hx   . Malignant hyperthermia Neg Hx   . Pseudochol deficiency Neg Hx       VITAL SIGNS BP 99/75   Pulse 68   Temp 98 F (36.7 C)   Resp 16   Ht 5\' 9"  (1.753 m)   Wt 193 lb 6.4 oz (87.7 kg)   BMI 28.56 kg/m   Outpatient Encounter Medications as of 05/25/2020  Medication Sig  . acetaminophen (TYLENOL) 500 MG tablet Take 500 mg by mouth every 6 (six) hours as needed. For pain   . albuterol (VENTOLIN HFA) 108 (90 Base) MCG/ACT inhaler Inhale 2 puffs into the lungs every 4 (four) hours as needed for wheezing or shortness of breath.  . allopurinol (ZYLOPRIM) 300 MG tablet Take 300 mg by mouth daily.    [name] (NORVASC) 5 MG tablet Take 1 tablet (5 mg total) by mouth daily.  [name] EC 81 MG tablet Take 81 mg by mouth daily.  [name] (LIPITOR) 20 MG tablet Take 1 tablet (20 mg total) by mouth daily.  Roseanne Kaufman Peru-Castor Oil (VENELEX) OINT Apply to sacrum, coccyx, and bilateral buttocks each shift and prn   . Balsam [place]-Castor Oil (VENELEX) OINT Apply topically. apply to the buttocks q shift and PRN  . clotrimazole (LOTRIMIN) 1 % cream Apply 1 application topically 2 (two) times daily. Special Instructions: apply to right and left thigh to fungal rash BID. Twice A Day  . donepezil (ARICEPT) 10 MG tablet Take 10 mg by mouth at bedtime.   . feeding supplement, GLUCERNA SHAKE, (GLUCERNA SHAKE) LIQD Take 237 mLs by mouth 2 (two) times daily between meals.   . gabapentin (NEURONTIN) 100 MG capsule Take 100 mg by mouth at bedtime.  [name] (ZESTRIL) 5 MG tablet Take 1 tablet (5 mg total) by mouth daily.  . meloxicam (MOBIC) 15 MG tablet Take 15 mg by mouth daily.   Derrill Memo ON 06/21/2020] NON FORMULARY Diet: _____ Regular, __X____ NAS, _______Consistent Carbohydrate, _______NPO _____Other   Diet - Liquids: _X__Regular; ___Thickened ___ Consistency:  ___ Nectar, ___Honey, ___ Pudding; ____ Fluid Restriction  . NON FORMULARY C-PAP from home, use home settings while sleeping At Bedtime 08:00 PM  clean CPAP mask and tubing w/baby shampoorinse well w/warm water weekly and PRN Once A Day on Tue 03:15 PM - 11:15 PM  . pantoprazole (PROTONIX) 40 MG tablet Take 1  tablet (40 mg total) by mouth daily.  . Vitamins A & D (VITAMIN A & D) ointment Apply A&D ointment to right foot and ankle qshift & prn for prevention. Every Shift  . Vitamins A & D (VITAMIN A & D) ointment Apply A&D ointment to left heel qshift & prn for prevention.   No facility-administered encounter medications on file as of 05/25/2020.     SIGNIFICANT DIAGNOSTIC EXAMS   PREVIOUS;   06-19-19: chest x-[place]: Minimal right basilar subsegmental atelectasis   06-19-19: ct of head: Mild diffuse cortical atrophy. No acute intracranial abnormality seen.  06-19-19: ct of lumbar spine:  1. No acute/traumatic lumbar spine pathology. 2. Osteopenia with extensive multilevel degenerative changes of the spine. 3. Lumbar levoscoliosis and multilevel disc bulge and neural foraminal narrowing. MRI may provide better evaluation. Aortic Atherosclerosis   06-22-19: lumbar spine x-Jourdyn: 1. Degenerative changes. 2. No evidence for acute abnormality   12-17-19: CT of head and cervical spine:  1. No acute intracranial pathology. Small-vessel white matter disease. 2. No fracture or static subluxation of the cervical spine. 3. Anterior cervical discectomy and fusion of C4 through C6, with incorporation of the disc spaces and bony ankylosis of the included levels inferior to this through T3. 4. Prominent osteophytes, disc calcifications and calcifications of the ligamentum flavum, which appear to significantly narrow the cervical canal at C3-C4, minimum AP diameter approximately 4 mm.  02-13-20: MRI lumbar spine:  1. L2 fracture involving the anterior wall and superior endplate, likely subacute. The  appearance suggest a hyperextension mechanism. No height loss. 2. L3-L4 severe spinal canal stenosis with severe right and moderate left neural foraminal stenosis. 3. T12-L1 moderate spinal canal stenosis and severe bilateral neural foraminal stenosis. 4. L1-L2 and L2-L3 severe right neural foraminal stenosis. 5. L5-S1 moderate bilateral neural foraminal stenosis.   02-13-20: MRI cervical spine:  1. 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 2. C4-6 ACDF without spinal canal stenosis. 3. Mild bilateral C6-7 neural foraminal stenosis.  03-20-20: Myoview:  No diagnostic ST segment changes to indicate ischemia. Small, moderate intensity, apical septal and apical to basal inferolateral defects. The inferolateral defect is fixed and consistent with possible scar and the apical septal defect is partially reversible consistent with a mild ischemic territory.This is a high risk study based on calculated LVEF. Would suggest confirmatory echocardiogram. Nuclear stress EF: 15%  04-10-20 2-d echo:  Left ventricular ejection fraction, by estimation, is 55 to 60%. The  left ventricle has normal function. The left ventricle has no regional  wall motion abnormalities. Left ventricular diastolic parameters are  consistent with age-related delayed  relaxation (normal).   NO NEW EXAMS.     LABS REVIEWED PREVIOUS;   06-19-19: wbc 8.4; hgb 14.2; hct 44.4; mcv 93.7 plt 317; glucose 106; bun 26; creat 0.82; k+ 4.1; na++ 138; ca 8.8; liver normal albumin 3.4; urine culture no growth 06-20-19-: wbc 7.7; hgb 13.1 hct 42.2; mcv 94.8; plt 306; glucose 108; bun 24; creat 0.80; k+ 3.9; na++ 138; ca 8.8 ;liver normal albumin 3.3  08-23-19: hgb a1c 5.6  10-05-19: wbc 4.8; hgb 11.8; hct 36.2; mcv 89.8 plt 256; glucose 100; bun 18; creat 0.53 ;k+ 3.9; an++ 128; ca 8.6; d-dimer: 3.36 ferritin 965 10-16-19:  glucose 96; bun 14; creat 0.47; k+ 3.7; na++ 134; ca 8.5 12-02-19: wbc 5.9; hgb 12.4; hct 38.9; mcv 91.1 plt 227; chol 96; ldl 43; trig  45; hdl 44  01-30-20: chol 96; ldl 43; trig 45; hdl 44   NO NEW LABS.     Review of Systems  Constitutional: Negative for malaise/fatigue.  Respiratory: Negative for cough and shortness of breath.   Cardiovascular: Negative for chest pain, palpitations and leg swelling.  Gastrointestinal: Negative for abdominal pain, constipation and heartburn.  Musculoskeletal: Negative for back pain, joint pain and myalgias.  Skin: Negative.   Neurological: Negative for dizziness.  Psychiatric/Behavioral: The patient is not nervous/anxious.     Physical Exam Constitutional:      General: He is not in acute distress.    Appearance: He is well-developed. He is not diaphoretic.  Neck:     Thyroid: No thyromegaly.  Cardiovascular:     Rate and Rhythm: Normal rate and regular rhythm.     Pulses: Normal pulses.     Heart sounds: Normal heart sounds.  Pulmonary:     Effort: Pulmonary effort is normal. No respiratory distress.     Breath sounds: Normal breath sounds.  Abdominal:     General: Bowel sounds are normal. There is no distension.     Palpations: Abdomen is soft.     Tenderness: There is no abdominal tenderness.  Musculoskeletal:        General: Normal range of motion.     Cervical back: Neck supple.     Right lower leg: No edema.     Left lower leg: No edema.  Lymphadenopathy:     Cervical: No cervical adenopathy.  Skin:    General: Skin is warm and dry.  Neurological:     Mental Status: He is alert. Mental status is at baseline.  Psychiatric:        Mood and Affect: Mood normal.      ASSESSMENT/ PLAN:  TODAY  1. Dyslipidemia: is stable LDL 43 will continue lipitor 20 mg daily  2. 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 Essential hypertension is stable b/p 99/75 will  continue norvasc 5 mg daily and lisinopril 5 mg daily   PREVIOUS   4. Sleep apnea obstructive: is stable will decline CPAP at times.   5. GERD without esophagitis: is stable will continue protonix 40 mg daily   6. Vascular dementia without behavioral disturbance: is without changes weight is 193 pounds will continue aricept 10 mg daily   7. Degenerative disc disease lumbar/cervical disc disease is without change will continue mobic 15 mg daily gabapentin 100 mg daily       MD is aware of resident's narcotic use and is in agreement with current plan of care. We will attempt to wean resident as appropriate.  Ok Edwards NP [facility]  Contact [phone] Monday through Friday 8am- 5pm  After hours call [phone]

## 2020-06-10 ENCOUNTER — Encounter: Payer: Self-pay | Admitting: Adult Health

## 2020-06-10 ENCOUNTER — Non-Acute Institutional Stay (SKILLED_NURSING_FACILITY): Payer: Medicare Other | Admitting: Adult Health

## 2020-06-10 DIAGNOSIS — R635 Abnormal weight gain: Secondary | ICD-10-CM | POA: Diagnosis not present

## 2020-06-10 DIAGNOSIS — F0150 Vascular dementia without behavioral disturbance: Secondary | ICD-10-CM | POA: Diagnosis not present

## 2020-06-10 NOTE — Progress Notes (Signed)
Location:    [facility] Room Number: 127/D Place of Service:  SNF (31)   CODE STATUS: DNR  Allergies  Allergen Reactions  . Other   . Penicillin G   . Sulfonic Acid (3,5-Dibromo-4-H-Ox-Benz)   . Penicillins Rash  . Sulfonamide Derivatives Rash  . Tetanus Toxoid Rash    Chief Complaint  Patient presents with  . Acute Visit    Weight Gain    HPI:  He has been steadily gaining weight 04-01-20: 186.6 pounds; 05-01-20 193.4 pounds; 06-03-20 203 pounds. His weight one year ago August 2020: 207 pounds. He told me that he was told to gain weight. I told him that he was back at his base weight and would not need to gain any more weight. He verbalized understanding. He denies any feelings of bloating; no shortness of breath. He denies any pain.   Past Medical History:  Diagnosis Date  . Cancer (HCC)    melanoma-left side  . Chronic pain    legs and feet  . Coronary artery disease   . Dementia ([place])   . Diabetes mellitus without complication ([place])   . Foot drop   . GERD (gastroesophageal reflux disease)   . Gout   . Hyperlipidemia   . Hypertension   . OCD (obsessive compulsive disorder)   . PONV (postoperative nausea and vomiting)   . Psychosis ([place])   . Reflux   . Sarcoidosis   . Sleep apnea    cpap    Past Surgical History:  Procedure Laterality Date  . BACK SURGERY     cervical neck  . CATARACT EXTRACTION W/PHACO  01/05/2012   Procedure: CATARACT EXTRACTION PHACO AND INTRAOCULAR LENS PLACEMENT (IOC);  Surgeon: [name] Branch, MD;  Location: [facility];  Service: Ophthalmology;  Laterality: Left;  CDE 18.47  . CATARACT EXTRACTION W/PHACO  02/02/2012   Procedure: CATARACT EXTRACTION PHACO AND INTRAOCULAR LENS PLACEMENT (IOC);  Surgeon: [name] Branch, MD;  Location: [facility];  Service: Ophthalmology;  Laterality: Right;  CDE:15.38  . CHOLECYSTECTOMY    . GALLBLADDER SURGERY    . hemorhoidectomy    . MELANOMA EXCISION     left side-[name]  . TONSILLECTOMY       Social History   Socioeconomic History  . Marital status: Married    Spouse name: Not on file  . Number of children: Not on file  . Years of education: Not on file  . Highest education level: Not on file  Occupational History  . Occupation: retired   Tobacco Use  . Smoking status: Former Smoker    Packs/day: 0.25    Years: 20.00    Pack years: 5.00    Types: Pipe    Quit date: 01/02/1974    Years since quitting: 46.4  . Smokeless tobacco: Never Used  Vaping Use  . Vaping Use: Never used  Substance and Sexual Activity  . Alcohol use: No  . Drug use: No  . Sexual activity: Not Currently  Other Topics Concern  . Not on file  Social History Narrative   Former pipe smoker, quit many years ago.   [facility] resident of [facility]      Social Determinants of Health   Financial Resource Strain: Low Risk   . Difficulty of Paying Living Expenses: Not hard at all  Food Insecurity: No Food Insecurity  . Worried About Charity fundraiser in the Last Year: Never true  . Ran Out of Food in the Last Year:  Never true  Transportation Needs: No Transportation Needs  . Lack of Transportation (Medical): No  . Lack of Transportation (Non-Medical): No  Physical Activity: Inactive  . Days of Exercise per Week: 0 days  . Minutes of Exercise per Session: 0 min  Stress: No Stress Concern Present  . Feeling of Stress : Not at all  Social Connections: Socially Isolated  . Frequency of Communication with Friends and Family: Never  . Frequency of Social Gatherings with Friends and Family: Never  . Attends Religious Services: Never  . Active Member of Clubs or Organizations: No  . Attends Archivist Meetings: Not asked  . Marital Status: Married  Human resources officer Violence: Not At Risk  . Fear of Current or Ex-Partner: No  . Emotionally Abused: No  . Physically Abused: No  . Sexually Abused: No   Family History  Problem Relation Age of Onset  . Heart disease Other   . Arthritis  Other   . Cancer Other   . Anesthesia problems Neg Hx   . Hypotension Neg Hx   . Malignant hyperthermia Neg Hx   . Pseudochol deficiency Neg Hx       VITAL SIGNS BP 122/64   Pulse 90   Temp (!) 97.2 F (36.2 C) (Oral)   Ht 5\' 9"  (1.753 m)   Wt 203 lb (92.1 kg)   BMI 29.98 kg/m   Outpatient Encounter Medications as of 06/10/2020  Medication Sig  . acetaminophen (TYLENOL) 500 MG tablet Take 500 mg by mouth every 6 (six) hours as needed. For pain   . albuterol (VENTOLIN HFA) 108 (90 Base) MCG/ACT inhaler Inhale 2 puffs into the lungs every 4 (four) hours as needed for wheezing or shortness of breath.  . allopurinol (ZYLOPRIM) 300 MG tablet Take 300 mg by mouth daily.    [name] (NORVASC) 5 MG tablet Take 1 tablet (5 mg total) by mouth daily.  [name] EC 81 MG tablet Take 81 mg by mouth daily.  [name] (LIPITOR) 20 MG tablet Take 1 tablet (20 mg total) by mouth daily.  Roseanne Kaufman [place]-Castor Oil (VENELEX) OINT Apply to sacrum, coccyx, and bilateral buttocks each shift and prn   . Balsam [place]-Castor Oil (VENELEX) OINT Apply topically. apply to the buttocks q shift and PRN  . clotrimazole (LOTRIMIN) 1 % cream Apply 1 application topically 2 (two) times daily. Special Instructions: apply to right and left thigh to fungal rash BID. Twice A Day  . donepezil (ARICEPT) 10 MG tablet Take 10 mg by mouth at bedtime.   . feeding supplement, GLUCERNA SHAKE, (GLUCERNA SHAKE) LIQD Take 237 mLs by mouth 2 (two) times daily between meals.   . gabapentin (NEURONTIN) 100 MG capsule Take 100 mg by mouth at bedtime.  [name] (ZESTRIL) 5 MG tablet Take 1 tablet (5 mg total) by mouth daily.  . meloxicam (MOBIC) 15 MG tablet Take 15 mg by mouth daily.   Derrill Memo ON 06/21/2020] NON FORMULARY Diet: _____ Regular, __X____ NAS, _______Consistent Carbohydrate, _______NPO _____Other   Diet - Liquids: _X__Regular; ___Thickened ___ Consistency: ___ Nectar, ___Honey, ___ Pudding; ____ Fluid  Restriction  . NON FORMULARY C-PAP from home, use home settings while sleeping At Bedtime 08:00 PM  clean CPAP mask and tubing w/baby shampoorinse well w/warm water weekly and PRN Once A Day on Tue 03:15 PM - 11:15 PM  . pantoprazole (PROTONIX) 40 MG tablet Take 1 tablet (40 mg total) by mouth daily.  . Vitamins A &  D (VITAMIN A & D) ointment Apply A&D ointment to right foot and ankle qshift & prn for prevention. Every Shift  . Vitamins A & D (VITAMIN A & D) ointment Apply A&D ointment to left heel qshift & prn for prevention.   No facility-administered encounter medications on file as of 06/10/2020.     SIGNIFICANT DIAGNOSTIC EXAMS   PREVIOUS;   06-19-19: chest x-Phuong: Minimal right basilar subsegmental atelectasis   06-19-19: ct of head: Mild diffuse cortical atrophy. No acute intracranial abnormality seen.  06-19-19: ct of lumbar spine:  1. No acute/traumatic lumbar spine pathology. 2. Osteopenia with extensive multilevel degenerative changes of the spine. 3. Lumbar levoscoliosis and multilevel disc bulge and neural foraminal narrowing. MRI may provide better evaluation. Aortic Atherosclerosis   06-22-19: lumbar spine x-[place]ib[place]: 1. Degenerative changes. 2. No evidence for acute abnormality   12-17-19: CT of head and cervical spine:  1. No acute intracranial pathology. Small-vessel white matter disease. 2. No fracture or static subluxation of the cervical spine. 3. Anterior cervical discectomy and fusion of C4 through C6, with incorporation of the disc spaces and bony ankylosis of the included levels inferior to this through T3. 4. Prominent osteophytes, disc calcifications and calcifications of the ligamentum flavum, which appear to significantly narrow the cervical canal at C3-C4, minimum AP diameter approximately 4 mm.  02-13-20: MRI lumbar spine:  1. L2 fracture involving the anterior wall and superior endplate, likely subacute. The appearance suggest a hyperextension  mechanism. No height loss. 2. L3-L4 severe spinal canal stenosis with severe right and moderate left neural foraminal stenosis. 3. T12-L1 moderate spinal canal stenosis and severe bilateral neural foraminal stenosis. 4. L1-L2 and L2-L3 severe right neural foraminal stenosis. 5. L5-S1 moderate bilateral neural foraminal stenosis.   02-13-20: MRI cervical spine:  1. 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 2. C4-6 ACDF without spinal canal stenosis. 3. Mild bilateral C6-7 neural foraminal stenosis.  03-20-20: Myoview:  No diagnostic ST segment changes to indicate ischemia. Small, moderate intensity, apical septal and apical to basal inferolateral defects. The inferolateral defect is fixed and consistent with possible scar and the apical septal defect is partially reversible consistent with a mild ischemic territory.This is a high risk study based on calculated LVEF. Would suggest confirmatory echocardiogram. Nuclear stress EF: 15%  04-10-20 2-d echo:  Left ventricular ejection fraction, by estimation, is 55 to 60%. The  left ventricle has normal function. The left ventricle has no regional  wall motion abnormalities. Left ventricular diastolic parameters are  consistent with age-related delayed  relaxation (normal).   NO NEW EXAMS.     LABS REVIEWED PREVIOUS;   06-19-19: wbc 8.4; hgb 14.2; hct 44.4; mcv 93.7 plt 317; glucose 106; bun 26; creat 0.82; k+ 4.1; na++ 138; ca 8.8; liver normal albumin 3.4; urine culture no growth 06-20-19-: wbc 7.7; hgb 13.1 hct 42.2; mcv 94.8; plt 306; glucose 108; bun 24; creat 0.80; k+ 3.9; na++ 138; ca 8.8 ;liver normal albumin 3.3  08-23-19: hgb a1c 5.6  10-05-19: wbc 4.8; hgb 11.8; hct 36.2; mcv 89.8 plt 256; glucose 100; bun 18; creat 0.53 ;k+ 3.9; an++ 128; ca 8.6; d-dimer: 3.36 ferritin 965 10-16-19: glucose 96; bun 14; creat 0.47; k+ 3.7;  na++ 134; ca 8.5 12-02-19: wbc 5.9; hgb 12.4; hct 38.9; mcv 91.1 plt 227; chol 96; ldl 43; trig 45; hdl 44  01-30-20: chol 96; ldl 43; trig 45; hdl  [place].    Review of Systems  Constitutional: Negative for malaise/fatigue.  Respiratory: Negative for cough and shortness of breath.   Cardiovascular: Negative for chest pain, palpitations and leg swelling.  Gastrointestinal: Negative for abdominal pain, constipation and heartburn.  Musculoskeletal: Negative for back pain, joint pain and myalgias.  Skin: Negative.   Neurological: Negative for dizziness.  Psychiatric/Behavioral: The patient is not nervous/anxious.     Physical Exam Constitutional:      General: He is not in acute distress.    Appearance: He is well-developed. He is not diaphoretic.  Neck:     Thyroid: No thyromegaly.  Cardiovascular:     Rate and Rhythm: Normal rate and regular rhythm.     Pulses: Normal pulses.     Heart sounds: Normal heart sounds.  Pulmonary:     Effort: Pulmonary effort is normal. No respiratory distress.     Breath sounds: Normal breath sounds.  Abdominal:     General: Bowel sounds are normal. There is no distension.     Palpations: Abdomen is soft.     Tenderness: There is no abdominal tenderness.  Musculoskeletal:     Cervical back: Neck supple.     Right lower leg: No edema.     Left lower leg: No edema.     Comments: Is able to move all extremities   Lymphadenopathy:     Cervical: No cervical adenopathy.  Skin:    General: Skin is warm and dry.  Neurological:     Mental Status: He is alert. Mental status is at baseline.  Psychiatric:        Mood and Affect: Mood normal.       ASSESSMENT/ PLAN:  TODAY  1. Vascular dementia without behavioral disturbance 2. Weight gain:   At this time his weight has returned back to his baseline from one year ago Will check cbc; cmp; tsh hgb a1c   MD is aware of resident's narcotic use and is in agreement with current plan of  care. We will attempt to wean resident as appropriate.  Ok Edwards NP [facility]  Contact [phone] Monday through Friday 8am- 5pm  After hours call [phone]

## 2020-06-11 ENCOUNTER — Other Ambulatory Visit (HOSPITAL_COMMUNITY)
Admission: RE | Admit: 2020-06-11 | Discharge: 2020-06-11 | Disposition: A | Discharge status: Home / Self Care | Payer: Medicare Other | Source: Skilled Nursing Facility | Attending: Adult Health | Admitting: Adult Health

## 2020-06-11 DIAGNOSIS — E119 Type 2 diabetes mellitus without complications: Secondary | ICD-10-CM | POA: Diagnosis present

## 2020-06-11 DIAGNOSIS — R635 Abnormal weight gain: Secondary | ICD-10-CM | POA: Insufficient documentation

## 2020-06-11 LAB — CBC
HCT: 39 % (ref 39.0–52.0)
Hemoglobin: 12.5 g/dL — ABNORMAL LOW (ref 13.0–17.0)
MCH: 29.7 pg (ref 26.0–34.0)
MCHC: 32.1 g/dL (ref 30.0–36.0)
MCV: 92.6 fL (ref 80.0–100.0)
Platelets: 184 10*3/uL (ref 150–400)
RBC: 4.21 MIL/uL — ABNORMAL LOW (ref 4.22–5.81)
RDW: 15 % (ref 11.5–15.5)
WBC: 5.5 10*3/uL (ref 4.0–10.5)
nRBC: 0 % (ref 0.0–0.2)

## 2020-06-11 LAB — COMPREHENSIVE METABOLIC PANEL
ALT: 17 U/L (ref 0–44)
AST: 16 U/L (ref 15–41)
Albumin: 3.5 g/dL (ref 3.5–5.0)
Alkaline Phosphatase: 74 U/L (ref 38–126)
Anion gap: 9 (ref 5–15)
BUN: 26 mg/dL — ABNORMAL HIGH (ref 8–23)
CO2: 25 mmol/L (ref 22–32)
Calcium: 8.9 mg/dL (ref 8.9–10.3)
Chloride: 97 mmol/L — ABNORMAL LOW (ref 98–111)
Creatinine, Ser: 0.68 mg/dL (ref 0.61–1.24)
GFR calc Af Amer: >60 mL/min (ref >60)
GFR calc non Af Amer: >60 mL/min (ref >60)
Glucose, Bld: 99 mg/dL (ref 70–99)
Potassium: 4.6 mmol/L (ref 3.5–5.1)
Sodium: 131 mmol/L — ABNORMAL LOW (ref 135–145)
Total Bilirubin: 0.6 mg/dL (ref 0.3–1.2)
Total Protein: 5.7 g/dL — ABNORMAL LOW (ref 6.5–8.1)

## 2020-06-11 LAB — HEMOGLOBIN A1C
Hgb A1c MFr Bld: 5.5 % (ref 4.8–5.6)
Mean Plasma Glucose: 111.15 mg/dL

## 2020-06-11 LAB — TSH: TSH: 2.617 u[IU]/mL (ref 0.350–4.500)

## 2020-06-25 ENCOUNTER — Non-Acute Institutional Stay (SKILLED_NURSING_FACILITY): Payer: Medicare Other | Admitting: Adult Health

## 2020-06-25 ENCOUNTER — Encounter: Payer: Self-pay | Admitting: Adult Health

## 2020-06-25 DIAGNOSIS — G4733 Obstructive sleep apnea (adult) (pediatric): Secondary | ICD-10-CM | POA: Diagnosis not present

## 2020-06-25 DIAGNOSIS — F0150 Vascular dementia without behavioral disturbance: Secondary | ICD-10-CM

## 2020-06-25 DIAGNOSIS — K219 Gastro-esophageal reflux disease without esophagitis: Secondary | ICD-10-CM | POA: Diagnosis not present

## 2020-06-25 DIAGNOSIS — J3089 Other allergic rhinitis: Secondary | ICD-10-CM

## 2020-06-25 DIAGNOSIS — Z9989 Dependence on other enabling machines and devices: Secondary | ICD-10-CM

## 2020-06-25 NOTE — Progress Notes (Signed)
Location:    [facility] Room Number: 127/D Place of Service:  SNF (31)   CODE STATUS: DNR  Allergies  Allergen Reactions  . Other   . Penicillin G   . Sulfonic Acid (3,5-Dibromo-4-H-Ox-Benz)   . Penicillins Rash  . Sulfonamide Derivatives Rash  . Tetanus Toxoid Rash    Chief Complaint  Patient presents with  . Medical Management of Chronic Issues          Sleep apnea obstructive  GERD without esophagitis:  Vascular dementia without behavioral disturbance     HPI:  He is a 86 year old long term resident of this facility being seen for the management of his chronic illnesses:  Sleep apnea obstructive  GERD without esophagitis:  Vascular dementia without behavioral disturbance. There are no reports of heart burn; no constipation; no uncontrolled pain. He does have some sinus congestion present without sore throat without cough or shortness of breath no fevers.   Past Medical History:  Diagnosis Date  . Cancer (HCC)    melanoma-left side  . Chronic pain    legs and feet  . Coronary artery disease   . Dementia ([place])   . Diabetes mellitus without complication ([place])   . Foot drop   . GERD (gastroesophageal reflux disease)   . Gout   . Hyperlipidemia   . Hypertension   . OCD (obsessive compulsive disorder)   . PONV (postoperative nausea and vomiting)   . Psychosis ([place])   . Reflux   . Sarcoidosis   . Sleep apnea    cpap    Past Surgical History:  Procedure Laterality Date  . BACK SURGERY     cervical neck  . CATARACT EXTRACTION W/PHACO  01/05/2012   Procedure: CATARACT EXTRACTION PHACO AND INTRAOCULAR LENS PLACEMENT (IOC);  Surgeon: [name] Branch, MD;  Location: [facility];  Service: Ophthalmology;  Laterality: Left;  CDE 18.47  . CATARACT EXTRACTION W/PHACO  02/02/2012   Procedure: CATARACT EXTRACTION PHACO AND INTRAOCULAR LENS PLACEMENT (IOC);  Surgeon: [name] Branch, MD;  Location: [facility];  Service: Ophthalmology;  Laterality: Right;  CDE:15.38  .  CHOLECYSTECTOMY    . GALLBLADDER SURGERY    . hemorhoidectomy    . MELANOMA EXCISION     left side-[name]  . TONSILLECTOMY      Social History   Socioeconomic History  . Marital status: Married    Spouse name: Not on file  . Number of children: Not on file  . Years of education: Not on file  . Highest education level: Not on file  Occupational History  . Occupation: retired   Tobacco Use  . Smoking status: Former Smoker    Packs/day: 0.25    Years: 20.00    Pack years: 5.00    Types: Pipe    Quit date: 01/02/1974    Years since quitting: 46.5  . Smokeless tobacco: Never Used  Vaping Use  . Vaping Use: Never used  Substance and Sexual Activity  . Alcohol use: No  . Drug use: No  . Sexual activity: Not Currently  Other Topics Concern  . Not on file  Social History Narrative   Former pipe smoker, quit many years ago.   Long term resident of [facility]      Social Determinants of Health   Financial Resource Strain: Low Risk   . Difficulty of Paying Living Expenses: Not hard at all  Food Insecurity: No Food Insecurity  . Worried About Charity fundraiser in  the Last Year: Never true  . Ran Out of Food in the Last Year: Never true  Transportation Needs: No Transportation Needs  . Lack of Transportation (Medical): No  . Lack of Transportation (Non-Medical): No  Physical Activity: Inactive  . Days of Exercise per Week: 0 days  . Minutes of Exercise per Session: 0 min  Stress: No Stress Concern Present  . Feeling of Stress : Not at all  Social Connections: Socially Isolated  . Frequency of Communication with Friends and Family: Never  . Frequency of Social Gatherings with Friends and Family: Never  . Attends Religious Services: Never  . Active Member of Clubs or Organizations: No  . Attends Archivist Meetings: Not asked  . Marital Status: Married  Human resources officer Violence: Not At Risk  . Fear of Current or Ex-Partner: No  . Emotionally Abused: No  .  Physically Abused: No  . Sexually Abused: No   Family History  Problem Relation Age of Onset  . Heart disease Other   . Arthritis Other   . Cancer Other   . Anesthesia problems Neg Hx   . Hypotension Neg Hx   . Malignant hyperthermia Neg Hx   . Pseudochol deficiency Neg Hx       VITAL SIGNS BP 103/74   Pulse 84   Temp 98.1 F (36.7 C) (Oral)   Ht 5\' 9"  (1.753 m)   Wt 202 lb 12.8 oz (92 kg)   BMI 29.95 kg/m   Outpatient Encounter Medications as of 06/25/2020  Medication Sig  . acetaminophen (TYLENOL) 500 MG tablet Take 500 mg by mouth every 6 (six) hours as needed. For pain   . allopurinol (ZYLOPRIM) 300 MG tablet Take 300 mg by mouth daily.    [name] (NORVASC) 5 MG tablet Take 1 tablet (5 mg total) by mouth daily.  [name] EC 81 MG tablet Take 81 mg by mouth daily.  [name] (LIPITOR) 20 MG tablet Take 20 mg by mouth every evening.  Roseanne Kaufman [place]-Castor Oil [facility]) OINT Special Instructions: Apply to sacrum/coccyx and bilateral buttocks qshift prn erythema.  . cetirizine (ZYRTEC ALLERGY) 10 MG tablet Take 10 mg by mouth at bedtime. Special Instructions: chronic allergic rhinitis  . donepezil (ARICEPT) 10 MG tablet Take 10 mg by mouth at bedtime.   . feeding supplement, GLUCERNA SHAKE, (GLUCERNA SHAKE) LIQD Take 237 mLs by mouth 2 (two) times daily between meals.   . gabapentin (NEURONTIN) 100 MG capsule Take 100 mg by mouth at bedtime.  [name] (ZESTRIL) 5 MG tablet Take 1 tablet (5 mg total) by mouth daily.  . meloxicam (MOBIC) 15 MG tablet Take 15 mg by mouth daily.   . NON FORMULARY Diet: _____ Regular, __X____ NAS, _______Consistent Carbohydrate, _______NPO _____Other   Diet - Liquids: _X__Regular; ___Thickened ___ Consistency: ___ Nectar, ___Honey, ___ Pudding; ____ Fluid Restriction  . NON FORMULARY C-PAP from home, use home settings while sleeping At Bedtime 08:00 PM  clean CPAP mask and tubing w/baby shampoorinse well w/warm water weekly  and PRN Once A Day on Tue 03:15 PM - 11:15 PM  . pantoprazole (PROTONIX) 40 MG tablet Take 1 tablet (40 mg total) by mouth daily.  . Vitamins A & D (VITAMIN A & D) ointment Apply A&D ointment to right foot and ankle qshift & prn for prevention. Every Shift  . Vitamins A & D (VITAMIN A & D) ointment Apply A&D ointment to left heel qshift & prn for prevention.  . [  DISCONTINUED] albuterol (VENTOLIN HFA) 108 (90 Base) MCG/ACT inhaler Inhale 2 puffs into the lungs every 4 (four) hours as needed for wheezing or shortness of breath.  . [DISCONTINUED] atorvastatin (LIPITOR) 20 MG tablet Take 1 tablet (20 mg total) by mouth daily.  . [DISCONTINUED] Balsam Peru-Castor Oil (VENELEX) OINT Apply topically. apply to the buttocks q shift and PRN  . [DISCONTINUED] clotrimazole (LOTRIMIN) 1 % cream Apply 1 application topically 2 (two) times daily. Special Instructions: apply to right and left thigh to fungal rash BID. Twice A Day   No facility-administered encounter medications on file as of 06/25/2020.     SIGNIFICANT DIAGNOSTIC EXAMS   PREVIOUS;   06-19-19: chest x-[place]: Minimal right basilar subsegmental atelectasis   06-19-19: ct of head: Mild diffuse cortical atrophy. No acute intracranial abnormality seen.  06-19-19: ct of lumbar spine:  1. No acute/traumatic lumbar spine pathology. 2. Osteopenia with extensive multilevel degenerative changes of the spine. 3. Lumbar levoscoliosis and multilevel disc bulge and neural foraminal narrowing. MRI may provide better evaluation. Aortic Atherosclerosis   06-22-19: lumbar spine x-[place]uanita: 1. Degenerative changes. 2. No evidence for acute abnormality   12-17-19: CT of head and cervical spine:  1. No acute intracranial pathology. Small-vessel white matter disease. 2. No fracture or static subluxation of the cervical spine. 3. Anterior cervical discectomy and fusion of C4 through C6, with incorporation of the disc spaces and bony ankylosis of the  included levels inferior to this through T3. 4. Prominent osteophytes, disc calcifications and calcifications of the ligamentum flavum, which appear to significantly narrow the cervical canal at C3-C4, minimum AP diameter approximately 4 mm.  02-13-20: MRI lumbar spine:  1. L2 fracture involving the anterior wall and superior endplate, likely subacute. The appearance suggest a hyperextension mechanism. No height loss. 2. L3-L4 severe spinal canal stenosis with severe right and moderate left neural foraminal stenosis. 3. T12-L1 moderate spinal canal stenosis and severe bilateral neural foraminal stenosis. 4. L1-L2 and L2-L3 severe right neural foraminal stenosis. 5. L5-S1 moderate bilateral neural foraminal stenosis.   02-13-20: MRI cervical spine:  1. 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 2. C4-6 ACDF without spinal canal stenosis. 3. Mild bilateral C6-7 neural foraminal stenosis.  03-20-20: Myoview:  No diagnostic ST segment changes to indicate ischemia. Small, moderate intensity, apical septal and apical to basal inferolateral defects. The inferolateral defect is fixed and consistent with possible scar and the apical septal defect is partially reversible consistent with a mild ischemic territory.This is a high risk study based on calculated LVEF. Would suggest confirmatory echocardiogram. Nuclear stress EF: 15%  04-10-20 2-d echo:  Left ventricular ejection fraction, by estimation, is 55 to 60%. The  left ventricle has normal function. The left ventricle has no regional  wall motion abnormalities. Left ventricular diastolic parameters are  consistent with age-related delayed  relaxation (normal).   NO NEW EXAMS.     LABS REVIEWED PREVIOUS;   06-19-19: wbc 8.4; hgb 14.2; hct 44.4; mcv 93.7 plt 317; glucose 106; bun 26; creat 0.82; k+ 4.1; na++ 138; ca 8.8; liver normal  albumin 3.4; urine culture no growth 06-20-19-: wbc 7.7; hgb 13.1 hct 42.2; mcv 94.8; plt 306; glucose 108; bun 24; creat 0.80; k+ 3.9; na++ 138; ca 8.8 ;liver normal albumin 3.3  08-23-19: hgb a1c 5.6  10-05-19: wbc 4.8; hgb 11.8; hct 36.2; mcv 89.8 plt 256; glucose 100; bun 18; creat 0.53 ;k+  3.9; an++ 128; ca 8.6; d-dimer: 3.36 ferritin 965 10-16-19: glucose 96; bun 14; creat 0.47; k+ 3.7; na++ 134; ca 8.5 12-02-19: wbc 5.9; hgb 12.4; hct 38.9; mcv 91.1 plt 227; chol 96; ldl 43; trig 45; hdl 44  01-30-20: chol 96; ldl 43; trig 45; hdl 44   TODAY  06-11-20: wbc 5.5; hgb 12.5; hct 39.0 mcv 92.6 plt 184; glucose 99; bun 26; creat 0.68; k+ 4.6; na++ 131; ca 8.9 liver normal albumin 3.5 hgb a1c 5.5; tsh 2.617     Review of Systems  Constitutional: Negative for malaise/fatigue.  HENT: Positive for congestion.   Respiratory: Negative for cough and shortness of breath.   Cardiovascular: Negative for chest pain, palpitations and leg swelling.  Gastrointestinal: Negative for abdominal pain, constipation and heartburn.  Musculoskeletal: Negative for back pain, joint pain and myalgias.  Skin: Negative.   Neurological: Negative for dizziness.  Psychiatric/Behavioral: The patient is not nervous/anxious.     Physical Exam Constitutional:      General: He is not in acute distress.    Appearance: He is well-developed. He is not diaphoretic.  HENT:     Mouth/Throat:     Mouth: Mucous membranes are moist.     Pharynx: Oropharynx is clear.  Neck:     Thyroid: No thyromegaly.  Cardiovascular:     Rate and Rhythm: Normal rate and regular rhythm.     Heart sounds: Normal heart sounds.  Pulmonary:     Effort: Pulmonary effort is normal. No respiratory distress.     Breath sounds: Normal breath sounds.  Abdominal:     General: Bowel sounds are normal. There is no distension.     Palpations: Abdomen is soft.     Tenderness: There is no abdominal tenderness.  Musculoskeletal:     Cervical back: Neck  supple.     Right lower leg: No edema.     Left lower leg: No edema.     Comments: Is able to move all extremities   Lymphadenopathy:     Cervical: No cervical adenopathy.  Skin:    General: Skin is warm and dry.  Neurological:     Mental Status: He is alert. Mental status is at baseline.  Psychiatric:        Mood and Affect: Mood normal.      ASSESSMENT/ PLAN:  TODAY  1. Sleep apnea obstructive is stable will decline CPAP at times  2. GERD without esophagitis: is stable will continue protonix 40 mg daily   3. Vascular dementia without behavioral disturbance is without changes weight is 202 pounds will continue aricept 10 mg daily   4. Chronic non-seasonal allergic rhinitis: is worse: will begin zyrtec 10 mg nightly    PREVIOUS   4. Degenerative disc disease lumbar/cervical disc disease is without change will continue mobic 15 mg daily gabapentin 100 mg daily   5. Dyslipidemia: is stable LDL 43 will continue lipitor 20 mg daily  6. 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 Essential hypertension is stable b/p 103/74 will continue norvasc 5 mg daily and lisinopril 5 mg daily        MD is aware of resident's narcotic use and is in agreement with current plan of care. We will attempt to wean resident as appropriate.  Ok Edwards NP [facility]  Contact [phone] Monday through Friday 8am- 5pm  After hours call [phone]

## 2020-06-26 DIAGNOSIS — J3089 Other allergic rhinitis: Secondary | ICD-10-CM | POA: Insufficient documentation

## 2020-07-02 ENCOUNTER — Encounter: Payer: Self-pay | Admitting: Adult Health

## 2020-07-02 ENCOUNTER — Non-Acute Institutional Stay (SKILLED_NURSING_FACILITY): Payer: Medicare Other | Admitting: Adult Health

## 2020-07-02 DIAGNOSIS — F0150 Vascular dementia without behavioral disturbance: Secondary | ICD-10-CM | POA: Diagnosis not present

## 2020-07-02 DIAGNOSIS — M1A09X0 Idiopathic chronic gout, multiple sites, without tophus (tophi): Secondary | ICD-10-CM

## 2020-07-02 DIAGNOSIS — M5412 Radiculopathy, cervical region: Secondary | ICD-10-CM

## 2020-07-02 NOTE — Progress Notes (Signed)
Location:    [facility] Room Number: 127/D Place of Service:  SNF (31)   CODE STATUS: DNR  Allergies  Allergen Reactions   Other    Penicillin G    Sulfonic Acid (3,5-Dibromo-4-H-Ox-Benz)    Penicillins Rash   Sulfonamide Derivatives Rash   Tetanus Toxoid Rash    Chief Complaint  Patient presents with   Acute Visit    Care Plan Meeting    HPI:  We have come together for his care plan meeting. Family present. BIMS 11/15 mood 3/30. No reports of falls. His weight is without significant change. He is total care with his adls; is able to feed self. Is frequently incontinent of bladder and bowel. He is awaiting Dr. Christella Noa for further neurological workup. There are no reports of uncontrolled pain. He continues to be followed for his chronic illnesses including: Vascular dementia without behavioral disturbance Radiculopathy of cervical spine Idiopathic chronic gout of multiple sites without tophus  Past Medical History:  Diagnosis Date   Cancer ([place])    melanoma-left side   Chronic pain    legs and feet   Coronary artery disease    Dementia ()    Diabetes mellitus without complication (HCC)    Foot drop    GERD (gastroesophageal reflux disease)    Gout    Hyperlipidemia    Hypertension    OCD (obsessive compulsive disorder)    PONV (postoperative nausea and vomiting)    Psychosis ([place])    Reflux    Sarcoidosis    Sleep apnea    cpap    Past Surgical History:  Procedure Laterality Date   BACK SURGERY     cervical neck   CATARACT EXTRACTION W/PHACO  01/05/2012   Procedure: CATARACT EXTRACTION PHACO AND INTRAOCULAR LENS PLACEMENT ([place]);  Surgeon: [name] Branch, MD;  Location: [facility];  Service: Ophthalmology;  Laterality: Left;  CDE 18.47   CATARACT EXTRACTION W/PHACO  02/02/2012   Procedure: CATARACT EXTRACTION PHACO AND INTRAOCULAR LENS PLACEMENT ([facility]);  Surgeon: [name] Branch, MD;  Location: [facility];  Service: Ophthalmology;   Laterality: Right;  CDE:15.38   CHOLECYSTECTOMY     GALLBLADDER SURGERY     hemorhoidectomy     MELANOMA EXCISION     left side-[name]   TONSILLECTOMY      Social History   Socioeconomic History   Marital status: Married    Spouse name: Not on file   Number of children: Not on file   Years of education: Not on file   Highest education level: Not on file  Occupational History   Occupation: retired   Tobacco Use   Smoking status: Former Smoker    Packs/day: 0.25    Years: 20.00    Pack years: 5.00    Types: Pipe    Quit date: 01/02/1974    Years since quitting: 46.5   Smokeless tobacco: Never Used  Vaping Use   Vaping Use: Never used  Substance and Sexual Activity   Alcohol use: No   Drug use: No   Sexual activity: Not Currently  Other Topics Concern   Not on file  Social History Narrative   Former pipe smoker, quit many years ago.   Long term resident of [facility]      Social Determinants of Health   Financial Resource Strain: Low Risk    Difficulty of Paying Living Expenses: Not hard at all  Food Insecurity: No Food Insecurity   Worried About Charity fundraiser  in the Last Year: Never true   [place] in the Last Year: Never true  Transportation Needs: No Transportation Needs   Lack of Transportation (Medical): No   Lack of Transportation (Non-Medical): No  Physical Activity: Inactive   Days of Exercise per Week: 0 days   Minutes of Exercise per Session: 0 min  Stress: No Stress Concern Present   Feeling of Stress : Not at all  Social Connections: Socially Isolated   Frequency of Communication with Friends and Family: Never   Frequency of Social Gatherings with Friends and Family: Never   Attends Religious Services: Never   Marine scientist or Organizations: No   Attends Music therapist: Not asked   Marital Status: Married  Human resources officer Violence: Not At Risk   Fear of Current or Ex-Partner: No    Emotionally Abused: No   Physically Abused: No   Sexually Abused: No   Family History  Problem Relation Age of Onset   Heart disease Other    Arthritis Other    Cancer Other    Anesthesia problems Neg Hx    Hypotension Neg Hx    Malignant hyperthermia Neg Hx    Pseudochol deficiency Neg Hx       VITAL SIGNS BP 94/66    Pulse 90    Temp (!) 97.2 F (36.2 C)    Ht 5\' 9"  (1.753 m)    Wt 210 lb 9.6 oz (95.5 kg)    BMI 31.10 kg/m   Outpatient Encounter Medications as of 07/02/2020  Medication Sig   acetaminophen (TYLENOL) 500 MG tablet Take 500 mg by mouth every 6 (six) hours as needed. For pain    allopurinol (ZYLOPRIM) 300 MG tablet Take 300 mg by mouth daily.     amLODipine (NORVASC) 5 MG tablet Take 1 tablet (5 mg total) by mouth daily.   aspirin EC 81 MG tablet Take 81 mg by mouth daily.   atorvastatin (LIPITOR) 20 MG tablet Take 20 mg by mouth every evening.   Balsam [place]-Castor Oil [facility]) OINT Special Instructions: Apply to sacrum/coccyx and bilateral buttocks qshift prn erythema.   cetirizine (ZYRTEC ALLERGY) 10 MG tablet Take 10 mg by mouth at bedtime. Special Instructions: chronic allergic rhinitis   donepezil (ARICEPT) 10 MG tablet Take 10 mg by mouth at bedtime.    gabapentin (NEURONTIN) 100 MG capsule Take 100 mg by mouth at bedtime.   lisinopril (ZESTRIL) 5 MG tablet Take 1 tablet (5 mg total) by mouth daily.   meloxicam (MOBIC) 15 MG tablet Take 15 mg by mouth daily.    NON FORMULARY Diet: _____ Regular, __X____ NAS, _______Consistent Carbohydrate, _______NPO _____Other   Diet - Liquids: _X__Regular; ___Thickened ___ Consistency: ___ Nectar, ___Honey, ___ Pudding; ____ Fluid Restriction   NON FORMULARY C-PAP from home, use home settings while sleeping At Bedtime 08:00 PM  clean CPAP mask and tubing w/baby shampoorinse well w/warm water weekly and PRN Once A Day on Tue 03:15 PM - 11:15 PM   pantoprazole (PROTONIX) 40 MG tablet Take 1  tablet (40 mg total) by mouth daily.   Vitamins A & D (VITAMIN A & D) ointment 3 (three) times daily as needed for dry skin. Apply A&D ointment to left heel qshift & prn for erythema.   [DISCONTINUED] feeding supplement, GLUCERNA SHAKE, (GLUCERNA SHAKE) LIQD Take 237 mLs by mouth 2 (two) times daily between meals.    [DISCONTINUED] Vitamins A & D (VITAMIN A & D) ointment  Apply A&D ointment to right foot and ankle qshift & prn for prevention. Every Shift   No facility-administered encounter medications on file as of 07/02/2020.     SIGNIFICANT DIAGNOSTIC EXAMS  PREVIOUS;   06-19-19: chest x-[place]: Minimal right basilar subsegmental atelectasis   06-19-19: ct of head: Mild diffuse cortical atrophy. No acute intracranial abnormality seen.  06-19-19: ct of lumbar spine:  1. No acute/traumatic lumbar spine pathology. 2. Osteopenia with extensive multilevel degenerative changes of the spine. 3. Lumbar levoscoliosis and multilevel disc bulge and neural foraminal narrowing. MRI may provide better evaluation. Aortic Atherosclerosis   06-22-19: lumbar spine x-[place]: 1. Degenerative changes. 2. No evidence for acute abnormality   12-17-19: CT of head and cervical spine:  1. No acute intracranial pathology. Small-vessel white matter disease. 2. No fracture or static subluxation of the cervical spine. 3. Anterior cervical discectomy and fusion of C4 through C6, with incorporation of the disc spaces and bony ankylosis of the included levels inferior to this through T3. 4. Prominent osteophytes, disc calcifications and calcifications of the ligamentum flavum, which appear to significantly narrow the cervical canal at C3-C4, minimum AP diameter approximately 4 mm.  02-13-20: MRI lumbar spine:  1. L2 fracture involving the anterior wall and superior endplate, likely subacute. The appearance suggest a hyperextension mechanism. No height loss. 2. L3-L4 severe spinal canal stenosis with severe right and  moderate left neural foraminal stenosis. 3. T12-L1 moderate spinal canal stenosis and severe bilateral neural foraminal stenosis. 4. L1-L2 and L2-L3 severe right neural foraminal stenosis. 5. L5-S1 moderate bilateral neural foraminal stenosis.   02-13-20: MRI cervical spine:  1. 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 2. C4-6 ACDF without spinal canal stenosis. 3. Mild bilateral C6-7 neural foraminal stenosis.  03-20-20: Myoview:  No diagnostic ST segment changes to indicate ischemia. Small, moderate intensity, apical septal and apical to basal inferolateral defects. The inferolateral defect is fixed and consistent with possible scar and the apical septal defect is partially reversible consistent with a mild ischemic territory.This is a high risk study based on calculated LVEF. Would suggest confirmatory echocardiogram. Nuclear stress EF: 15%  04-10-20 2-d echo:  Left ventricular ejection fraction, by estimation, is 55 to 60%. The  left ventricle has normal function. The left ventricle has no regional  wall motion abnormalities. Left ventricular diastolic parameters are  consistent with age-related delayed  relaxation (normal).   NO NEW EXAMS.     LABS REVIEWED PREVIOUS;   06-20-19-: wbc 7.7; hgb 13.1 hct 42.2; mcv 94.8; plt 306; glucose 108; bun 24; creat 0.80; k+ 3.9; na++ 138; ca 8.8 ;liver normal albumin 3.3  08-23-19: hgb a1c 5.6  10-05-19: wbc 4.8; hgb 11.8; hct 36.2; mcv 89.8 plt 256; glucose 100; bun 18; creat 0.53 ;k+ 3.9; an++ 128; ca 8.6; d-dimer: 3.36 ferritin 965 10-16-19: glucose 96; bun 14; creat 0.47; k+ 3.7; na++ 134; ca 8.5 12-02-19: wbc 5.9; hgb 12.4; hct 38.9; mcv 91.1 plt 227; chol 96; ldl 43; trig 45; hdl 44  01-30-20: chol 96; ldl 43; trig 45; hdl 44  06-11-20: wbc 5.5; hgb 12.5; hct 39.0 mcv 92.6 plt 184; glucose 99; bun 26; creat 0.68; k+ 4.6; na++ 131;  ca 8.9 liver normal albumin 3.5 hgb a1c 5.5; tsh 2.617  NO NEW LABS.      Review of Systems  Constitutional: Negative for malaise/fatigue.  Respiratory: Negative for cough and shortness of breath.  Cardiovascular: Negative for chest pain, palpitations and leg swelling.  Gastrointestinal: Negative for abdominal pain, constipation and heartburn.  Musculoskeletal: Negative for back pain, joint pain and myalgias.  Skin: Negative.   Neurological: Negative for dizziness.  Psychiatric/Behavioral: The patient is not nervous/anxious.     Physical Exam Constitutional:      General: He is not in acute distress.    Appearance: He is well-developed. He is not diaphoretic.  Neck:     Thyroid: No thyromegaly.  Cardiovascular:     Rate and Rhythm: Normal rate and regular rhythm.     Pulses: Normal pulses.     Heart sounds: Normal heart sounds.  Pulmonary:     Effort: Pulmonary effort is normal. No respiratory distress.     Breath sounds: Normal breath sounds.  Abdominal:     General: Bowel sounds are normal. There is no distension.     Palpations: Abdomen is soft.     Tenderness: There is no abdominal tenderness.  Musculoskeletal:     Cervical back: Neck supple.     Right lower leg: No edema.     Left lower leg: No edema.     Comments: Is able to move all extremities   Lymphadenopathy:     Cervical: No cervical adenopathy.  Skin:    General: Skin is warm and dry.  Neurological:     Mental Status: He is alert. Mental status is at baseline.  Psychiatric:        Mood and Affect: Mood normal.       ASSESSMENT/ PLAN:  TODAY  1. Vascular dementia without behavioral disturbance 2. Radiculopathy of cervical spine 3. Idiopathic chronic gout of multiple sites without tophus  Will continue current medications Will continue current plan of care Will continue to monitor his status.   MD is aware of resident's narcotic use and is in agreement with current plan of care. We will  attempt to wean resident as appropriate.  Ok Edwards NP [facility]  Contact [phone] Monday through Friday 8am- 5pm  After hours call [phone]

## 2020-07-03 ENCOUNTER — Encounter: Payer: Self-pay | Admitting: Adult Health

## 2020-07-03 ENCOUNTER — Non-Acute Institutional Stay (SKILLED_NURSING_FACILITY): Payer: Medicare Other | Admitting: Adult Health

## 2020-07-03 DIAGNOSIS — L03311 Cellulitis of abdominal wall: Secondary | ICD-10-CM | POA: Diagnosis not present

## 2020-07-03 NOTE — Progress Notes (Signed)
Location:    [place] Room Number: 127/D Place of Service:  SNF (31)   CODE STATUS: DNR  Allergies  Allergen Reactions  . Other   . Penicillin G   . Sulfonic Acid (3,5-Dibromo-4-H-Ox-Benz)   . Penicillins Rash  . Sulfonamide Derivatives Rash  . Tetanus Toxoid Rash    Chief Complaint  Patient presents with  . Acute Visit    Cellulitis    HPI:  Staff reports that today he has red areas on both sides of abdomen right >left. The areas are firm to touch; warm with little tenderness present. There are no reports of fevers present.   Past Medical History:  Diagnosis Date  . Cancer (HCC)    melanoma-left side  . Chronic pain    legs and feet  . Coronary artery disease   . Dementia ([place])   . Diabetes mellitus without complication ([place])   . Foot drop   . GERD (gastroesophageal reflux disease)   . Gout   . Hyperlipidemia   . Hypertension   . OCD (obsessive compulsive disorder)   . PONV (postoperative nausea and vomiting)   . Psychosis ([place])   . Reflux   . Sarcoidosis   . Sleep apnea    cpap    Past Surgical History:  Procedure Laterality Date  . BACK SURGERY     cervical neck  . CATARACT EXTRACTION W/PHACO  01/05/2012   Procedure: CATARACT EXTRACTION PHACO AND INTRAOCULAR LENS PLACEMENT ([facility]);  Surgeon: [name] Branch, MD;  Location: [facility];  Service: Ophthalmology;  Laterality: Left;  CDE 18.47  . CATARACT EXTRACTION W/PHACO  02/02/2012   Procedure: CATARACT EXTRACTION PHACO AND INTRAOCULAR LENS PLACEMENT ([facility]);  Surgeon: [name] Branch, MD;  Location: [facility];  Service: Ophthalmology;  Laterality: Right;  CDE:15.38  . CHOLECYSTECTOMY    . GALLBLADDER SURGERY    . hemorhoidectomy    . MELANOMA EXCISION     left side-[name]  . TONSILLECTOMY      Social History   Socioeconomic History  . Marital status: Married    Spouse name: Not on file  . Number of children: Not on file  . Years of education: Not on file  . Highest education level: Not  on file  Occupational History  . Occupation: retired   Tobacco Use  . Smoking status: Former Smoker    Packs/day: 0.25    Years: 20.00    Pack years: 5.00    Types: Pipe    Quit date: 01/02/1974    Years since quitting: 46.5  . Smokeless tobacco: Never Used  Vaping Use  . Vaping Use: Never used  Substance and Sexual Activity  . Alcohol use: No  . Drug use: No  . Sexual activity: Not Currently  Other Topics Concern  . Not on file  Social History Narrative   Former pipe smoker, quit many years ago.   Long term resident of [facility]      Social Determinants of Health   Financial Resource Strain: Low Risk   . Difficulty of Paying Living Expenses: Not hard at all  Food Insecurity: No Food Insecurity  . Worried About Charity fundraiser in the Last Year: Never true  . Ran Out of Food in the Last Year: Never true  Transportation Needs: No Transportation Needs  . Lack of Transportation (Medical): No  . Lack of Transportation (Non-Medical): No  Physical Activity: Inactive  . Days of Exercise per Week: 0 days  . Minutes of  Exercise per Session: 0 min  Stress: No Stress Concern Present  . Feeling of Stress : Not at all  Social Connections: Socially Isolated  . Frequency of Communication with Friends and Family: Never  . Frequency of Social Gatherings with Friends and Family: Never  . Attends Religious Services: Never  . Active Member of Clubs or Organizations: No  . Attends Archivist Meetings: Not asked  . Marital Status: Married  Human resources officer Violence: Not At Risk  . Fear of Current or Ex-Partner: No  . Emotionally Abused: No  . Physically Abused: No  . Sexually Abused: No   Family History  Problem Relation Age of Onset  . Heart disease Other   . Arthritis Other   . Cancer Other   . Anesthesia problems Neg Hx   . Hypotension Neg Hx   . Malignant hyperthermia Neg Hx   . Pseudochol deficiency Neg Hx       VITAL SIGNS BP 94/66   Pulse 90   Temp 98.1 F  (36.7 C) (Oral)   Resp 16   Ht 5\' 9"  (1.753 m)   Wt 210 lb 9.6 oz (95.5 kg)   BMI 31.10 kg/m   Outpatient Encounter Medications as of 07/03/2020  Medication Sig  . acetaminophen (TYLENOL) 500 MG tablet Take 500 mg by mouth every 6 (six) hours as needed. For pain   . allopurinol (ZYLOPRIM) 300 MG tablet Take 300 mg by mouth daily.    [name] (NORVASC) 5 MG tablet Take 1 tablet (5 mg total) by mouth daily.  [name] EC 81 MG tablet Take 81 mg by mouth daily.  [name] (LIPITOR) 20 MG tablet Take 20 mg by mouth every evening.  Roseanne Kaufman [place]-Castor Oil [facility]) OINT Special Instructions: Apply to sacrum/coccyx and bilateral buttocks qshift prn erythema.  . cetirizine (ZYRTEC ALLERGY) 10 MG tablet Take 10 mg by mouth at bedtime. Special Instructions: chronic allergic rhinitis  . donepezil (ARICEPT) 10 MG tablet Take 10 mg by mouth at bedtime.   [name] doxycycline (ADOXA) 100 MG tablet Take 100 mg by mouth 2 (two) times daily. For Cellulitis  . gabapentin (NEURONTIN) 100 MG capsule Take 100 mg by mouth at bedtime.  [name] (ZESTRIL) 5 MG tablet Take 1 tablet (5 mg total) by mouth daily.  . meloxicam (MOBIC) 15 MG tablet Take 15 mg by mouth daily.   . NON FORMULARY Diet: _____ Regular, __X____ NAS, _______Consistent Carbohydrate, _______NPO _____Other   Diet - Liquids: _X__Regular; ___Thickened ___ Consistency: ___ Nectar, ___Honey, ___ Pudding; ____ Fluid Restriction  . NON FORMULARY C-PAP from home, use home settings while sleeping At Bedtime 08:00 PM  clean CPAP mask and tubing w/baby shampoorinse well w/warm water weekly and PRN Once A Day on Tue 03:15 PM - 11:15 PM  . pantoprazole (PROTONIX) 40 MG tablet Take 1 tablet (40 mg total) by mouth daily.  . Vitamins A & D (VITAMIN A & D) ointment 3 (three) times daily as needed for dry skin. Apply A&D ointment to left heel qshift & prn for erythema.   No facility-administered encounter medications on file as of 07/03/2020.      SIGNIFICANT DIAGNOSTIC EXAMS   PREVIOUS;   06-19-19: chest x-Bob: Minimal right basilar subsegmental atelectasis   06-19-19: ct of head: Mild diffuse cortical atrophy. No acute intracranial abnormality seen.  06-19-19: ct of lumbar spine:  1. No acute/traumatic lumbar spine pathology. 2. Osteopenia with extensive multilevel degenerative changes of the spine. 3. Lumbar levoscoliosis  and multilevel disc bulge and neural foraminal narrowing. MRI may provide better evaluation. Aortic Atherosclerosis   06-22-19: lumbar spine x-[place]: 1. Degenerative changes. 2. No evidence for acute abnormality   12-17-19: CT of head and cervical spine:  1. No acute intracranial pathology. Small-vessel white matter disease. 2. No fracture or static subluxation of the cervical spine. 3. Anterior cervical discectomy and fusion of C4 through C6, with incorporation of the disc spaces and bony ankylosis of the included levels inferior to this through T3. 4. Prominent osteophytes, disc calcifications and calcifications of the ligamentum flavum, which appear to significantly narrow the cervical canal at C3-C4, minimum AP diameter approximately 4 mm.  02-13-20: MRI lumbar spine:  1. L2 fracture involving the anterior wall and superior endplate, likely subacute. The appearance suggest a hyperextension mechanism. No height loss. 2. L3-L4 severe spinal canal stenosis with severe right and moderate left neural foraminal stenosis. 3. T12-L1 moderate spinal canal stenosis and severe bilateral neural foraminal stenosis. 4. L1-L2 and L2-L3 severe right neural foraminal stenosis. 5. L5-S1 moderate bilateral neural foraminal stenosis.   02-13-20: MRI cervical spine:  1. 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 2. C4-6 ACDF without spinal canal stenosis. 3. Mild bilateral C6-7 neural foraminal  stenosis.  03-20-20: Myoview:  No diagnostic ST segment changes to indicate ischemia. Small, moderate intensity, apical septal and apical to basal inferolateral defects. The inferolateral defect is fixed and consistent with possible scar and the apical septal defect is partially reversible consistent with a mild ischemic territory.This is a high risk study based on calculated LVEF. Would suggest confirmatory echocardiogram. Nuclear stress EF: 15%  04-10-20 2-d echo:  Left ventricular ejection fraction, by estimation, is 55 to 60%. The  left ventricle has normal function. The left ventricle has no regional  wall motion abnormalities. Left ventricular diastolic parameters are  consistent with age-related delayed  relaxation (normal).   NO NEW EXAMS.     LABS REVIEWED PREVIOUS;   08-23-19: hgb a1c 5.6  10-05-19: wbc 4.8; hgb 11.8; hct 36.2; mcv 89.8 plt 256; glucose 100; bun 18; creat 0.53 ;k+ 3.9; an++ 128; ca 8.6; d-dimer: 3.36 ferritin 965 10-16-19: glucose 96; bun 14; creat 0.47; k+ 3.7; na++ 134; ca 8.5 12-02-19: wbc 5.9; hgb 12.4; hct 38.9; mcv 91.1 plt 227; chol 96; ldl 43; trig 45; hdl 44  01-30-20: chol 96; ldl 43; trig 45; hdl 44  06-11-20: wbc 5.5; hgb 12.5; hct 39.0 mcv 92.6 plt 184; glucose 99; bun 26; creat 0.68; k+ 4.6; na++ 131; ca 8.9 liver normal albumin 3.5 hgb a1c 5.5; tsh 2.617  NO NEW LABS.     Review of Systems  Constitutional: Negative for malaise/fatigue.  Respiratory: Negative for cough and shortness of breath.   Cardiovascular: Negative for chest pain, palpitations and leg swelling.  Gastrointestinal: Negative for abdominal pain, constipation and heartburn.  Musculoskeletal: Negative for back pain, joint pain and myalgias.  Skin: Positive for rash.  Neurological: Negative for dizziness.  Psychiatric/Behavioral: The patient is not nervous/anxious.     Physical Exam Constitutional:      General: He is not in acute distress.    Appearance: He is well-developed. He  is not diaphoretic.  Neck:     Thyroid: No thyromegaly.  Cardiovascular:     Rate and Rhythm: Normal rate and regular rhythm.     Pulses: Normal pulses.     Heart sounds: Normal heart  sounds.  Pulmonary:     Effort: Pulmonary effort is normal. No respiratory distress.     Breath sounds: Normal breath sounds.  Abdominal:     General: Bowel sounds are normal. There is no distension.     Palpations: Abdomen is soft.     Tenderness: There is no abdominal tenderness.  Musculoskeletal:     Cervical back: Neck supple.     Right lower leg: No edema.     Left lower leg: No edema.     Comments: Is able to move all extremities   Lymphadenopathy:     Cervical: No cervical adenopathy.  Skin:    General: Skin is warm and dry.     Comments: Bilateral abdomen areas are hard red warm and slightly tender to touch   Neurological:     Mental Status: He is alert. Mental status is at baseline.  Psychiatric:        Mood and Affect: Mood normal.       ASSESSMENT/ PLAN:  TODAY  1. Cellulitis of abdominal wall: is worse; will begin doxycycline 100 mg twice daily through 07-17-20      MD is aware of resident's narcotic use and is in agreement with current plan of care. We will attempt to wean resident as appropriate.  Ok Edwards NP [facility]  Contact [phone] Monday through Friday 8am- 5pm  After hours call [phone]

## 2020-07-07 DIAGNOSIS — L03311 Cellulitis of abdominal wall: Secondary | ICD-10-CM | POA: Insufficient documentation

## 2020-07-13 ENCOUNTER — Encounter: Payer: Self-pay | Admitting: Adult Health

## 2020-07-13 ENCOUNTER — Non-Acute Institutional Stay (SKILLED_NURSING_FACILITY): Payer: Medicare Other | Admitting: Adult Health

## 2020-07-13 DIAGNOSIS — N5089 Other specified disorders of the male genital organs: Secondary | ICD-10-CM

## 2020-07-13 NOTE — Progress Notes (Signed)
Location:    [place] Room Number: 127/D Place of Service:  SNF (31)   CODE STATUS: DNR  Allergies  Allergen Reactions  . Other   . Penicillin G   . Sulfonic Acid (3,5-Dibromo-4-H-Ox-Benz)   . Penicillins Rash  . Sulfonamide Derivatives Rash  . Tetanus Toxoid Rash    Chief Complaint  Patient presents with  . Acute Visit    Swollen Scrotum     HPI:  Staff reports that his scrotum has been swollen for the past several days. He denies any pain; there are no reports of fevers. He is on abt for abdominal cellulitis which is improving. He is incontinent of urine. His bladder is not distended.    Past Medical History:  Diagnosis Date  . Cancer (HCC)    melanoma-left side  . Chronic pain    legs and feet  . Coronary artery disease   . Dementia ([place])   . Diabetes mellitus without complication ([place])   . Foot drop   . GERD (gastroesophageal reflux disease)   . Gout   . Hyperlipidemia   . Hypertension   . OCD (obsessive compulsive disorder)   . PONV (postoperative nausea and vomiting)   . Psychosis ([place])   . Reflux   . Sarcoidosis   . Sleep apnea    cpap    Past Surgical History:  Procedure Laterality Date  . BACK SURGERY     cervical neck  . CATARACT EXTRACTION W/PHACO  01/05/2012   Procedure: CATARACT EXTRACTION PHACO AND INTRAOCULAR LENS PLACEMENT (IOC);  Surgeon: [name] Branch, MD;  Location: [facility];  Service: Ophthalmology;  Laterality: Left;  CDE 18.47  . CATARACT EXTRACTION W/PHACO  02/02/2012   Procedure: CATARACT EXTRACTION PHACO AND INTRAOCULAR LENS PLACEMENT (IOC);  Surgeon: [name] Branch, MD;  Location: [facility];  Service: Ophthalmology;  Laterality: Right;  CDE:15.38  . CHOLECYSTECTOMY    . GALLBLADDER SURGERY    . hemorhoidectomy    . MELANOMA EXCISION     left side-[name]  . TONSILLECTOMY      Social History   Socioeconomic History  . Marital status: Married    Spouse name: Not on file  . Number of children: Not on file  .  Years of education: Not on file  . Highest education level: Not on file  Occupational History  . Occupation: retired   Tobacco Use  . Smoking status: Former Smoker    Packs/day: 0.25    Years: 20.00    Pack years: 5.00    Types: Pipe    Quit date: 01/02/1974    Years since quitting: 46.5  . Smokeless tobacco: Never Used  Vaping Use  . Vaping Use: Never used  Substance and Sexual Activity  . Alcohol use: No  . Drug use: No  . Sexual activity: Not Currently  Other Topics Concern  . Not on file  Social History Narrative   Former pipe smoker, quit many years ago.   Long term resident of [facility]      Social Determinants of Health   Financial Resource Strain: Low Risk   . Difficulty of Paying Living Expenses: Not hard at all  Food Insecurity: No Food Insecurity  . Worried About Charity fundraiser in the Last Year: Never true  . Ran Out of Food in the Last Year: Never true  Transportation Needs: No Transportation Needs  . Lack of Transportation (Medical): No  . Lack of Transportation (Non-Medical): No  Physical Activity: Inactive  .  Days of Exercise per Week: 0 days  . Minutes of Exercise per Session: 0 min  Stress: No Stress Concern Present  . Feeling of Stress : Not at all  Social Connections: Socially Isolated  . Frequency of Communication with Friends and Family: Never  . Frequency of Social Gatherings with Friends and Family: Never  . Attends Religious Services: Never  . Active Member of Clubs or Organizations: No  . Attends Archivist Meetings: Not asked  . Marital Status: Married  Human resources officer Violence: Not At Risk  . Fear of Current or Ex-Partner: No  . Emotionally Abused: No  . Physically Abused: No  . Sexually Abused: No   Family History  Problem Relation Age of Onset  . Heart disease Other   . Arthritis Other   . Cancer Other   . Anesthesia problems Neg Hx   . Hypotension Neg Hx   . Malignant hyperthermia Neg Hx   . Pseudochol deficiency Neg  Hx       VITAL SIGNS BP 121/74   Pulse 75   Temp 98.3 F (36.8 C) (Oral)   Resp 20   Ht 5\' 9"  (1.753 m)   Wt 210 lb 9.6 oz (95.5 kg)   BMI 31.10 kg/m   Outpatient Encounter Medications as of 07/13/2020  Medication Sig  . acetaminophen (TYLENOL) 500 MG tablet Take 500 mg by mouth every 6 (six) hours as needed. For pain   . allopurinol (ZYLOPRIM) 300 MG tablet Take 300 mg by mouth daily.    [name] (NORVASC) 5 MG tablet Take 1 tablet (5 mg total) by mouth daily.  [name] EC 81 MG tablet Take 81 mg by mouth daily.  [name] (LIPITOR) 20 MG tablet Take 20 mg by mouth every evening.  Roseanne Kaufman [employer] [facility]) OINT Special Instructions: Apply to sacrum/coccyx and bilateral buttocks qshift prn erythema.  . cetirizine (ZYRTEC ALLERGY) 10 MG tablet Take 10 mg by mouth at bedtime. Special Instructions: chronic allergic rhinitis  . donepezil (ARICEPT) 10 MG tablet Take 10 mg by mouth at bedtime.   [name] doxycycline (VIBRAMYCIN) 100 MG capsule Take 100 mg by mouth 2 (two) times daily.  [name] gabapentin (NEURONTIN) 100 MG capsule Take 100 mg by mouth at bedtime.  [name] (ZESTRIL) 5 MG tablet Take 1 tablet (5 mg total) by mouth daily.  . meloxicam (MOBIC) 15 MG tablet Take 15 mg by mouth daily.   . NON FORMULARY Diet: _____ Regular, __X____ NAS, _______Consistent Carbohydrate, _______NPO _____Other   Diet - Liquids: _X__Regular; ___Thickened ___ Consistency: ___ Nectar, ___Honey, ___ Pudding; ____ Fluid Restriction  . NON FORMULARY C-PAP from home, use home settings while sleeping At Bedtime 08:00 PM  clean CPAP mask and tubing w/baby shampoorinse well w/warm water weekly and PRN Once A Day on Tue 03:15 PM - 11:15 PM  . pantoprazole (PROTONIX) 40 MG tablet Take 1 tablet (40 mg total) by mouth daily.  . Vitamins A & D (VITAMIN A & D) ointment 3 (three) times daily as needed for dry skin. Apply A&D ointment to left heel qshift & prn for erythema.  . [DISCONTINUED]  doxycycline (ADOXA) 100 MG tablet Take 100 mg by mouth 2 (two) times daily. For Cellulitis   No facility-administered encounter medications on file as of 07/13/2020.     SIGNIFICANT DIAGNOSTIC EXAMS   PREVIOUS;   06-19-19: chest x-Jacarius: Minimal right basilar subsegmental atelectasis   06-19-19: ct of head: Mild diffuse cortical atrophy. No acute intracranial  abnormality seen.  06-19-19: ct of lumbar spine:  1. No acute/traumatic lumbar spine pathology. 2. Osteopenia with extensive multilevel degenerative changes of the spine. 3. Lumbar levoscoliosis and multilevel disc bulge and neural foraminal narrowing. MRI may provide better evaluation. Aortic Atherosclerosis   06-22-19: lumbar spine x-Ethaniel: 1. Degenerative changes. 2. No evidence for acute abnormality   12-17-19: CT of head and cervical spine:  1. No acute intracranial pathology. Small-vessel white matter disease. 2. No fracture or static subluxation of the cervical spine. 3. Anterior cervical discectomy and fusion of C4 through C6, with incorporation of the disc spaces and bony ankylosis of the included levels inferior to this through T3. 4. Prominent osteophytes, disc calcifications and calcifications of the ligamentum flavum, which appear to significantly narrow the cervical canal at C3-C4, minimum AP diameter approximately 4 mm.  02-13-20: MRI lumbar spine:  1. L2 fracture involving the anterior wall and superior endplate, likely subacute. The appearance suggest a hyperextension mechanism. No height loss. 2. L3-L4 severe spinal canal stenosis with severe right and moderate left neural foraminal stenosis. 3. T12-L1 moderate spinal canal stenosis and severe bilateral neural foraminal stenosis. 4. L1-L2 and L2-L3 severe right neural foraminal stenosis. 5. L5-S1 moderate bilateral neural foraminal stenosis.   02-13-20: MRI cervical spine:  1. 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 2. C4-6 ACDF without spinal canal stenosis. 3. Mild bilateral C6-7 neural foraminal stenosis.  03-20-20: Myoview:  No diagnostic ST segment changes to indicate ischemia. Small, moderate intensity, apical septal and apical to basal inferolateral defects. The inferolateral defect is fixed and consistent with possible scar and the apical septal defect is partially reversible consistent with a mild ischemic territory.This is a high risk study based on calculated LVEF. Would suggest confirmatory echocardiogram. Nuclear stress EF: 15%  04-10-20 2-d echo:  Left ventricular ejection fraction, by estimation, is 55 to 60%. The  left ventricle has normal function. The left ventricle has no regional  wall motion abnormalities. Left ventricular diastolic parameters are  consistent with age-related delayed  relaxation (normal).   NO NEW EXAMS.     LABS REVIEWED PREVIOUS;   08-23-19: hgb a1c 5.6  10-05-19: wbc 4.8; hgb 11.8; hct 36.2; mcv 89.8 plt 256; glucose 100; bun 18; creat 0.53 ;k+ 3.9; an++ 128; ca 8.6; d-dimer: 3.36 ferritin 965 10-16-19: glucose 96; bun 14; creat 0.47; k+ 3.7; na++ 134; ca 8.5 12-02-19: wbc 5.9; hgb 12.4; hct 38.9; mcv 91.1 plt 227; chol 96; ldl 43; trig 45; hdl 44  01-30-20: chol 96; ldl 43; trig 45; hdl 44  06-11-20: wbc 5.5; hgb 12.5; hct 39.0 mcv 92.6 plt 184; glucose 99; bun 26; creat 0.68; k+ 4.6; na++ 131; ca 8.9 liver normal albumin 3.5 hgb a1c 5.5; tsh 2.617  NO NEW LABS.     Review of Systems  Constitutional: Negative for malaise/fatigue.  Respiratory: Negative for cough and shortness of breath.   Cardiovascular: Negative for chest pain, palpitations and leg swelling.  Gastrointestinal: Negative for abdominal pain, constipation and heartburn.  Musculoskeletal: Negative for back pain, joint pain and myalgias.  Skin: Negative.   Neurological: Negative for dizziness.  Psychiatric/Behavioral: The  patient is not nervous/anxious.     Physical Exam Constitutional:      General: He is not in acute distress.    Appearance: He is well-developed. He is not diaphoretic.  Neck:     Thyroid: No thyromegaly.  Cardiovascular:  Rate and Rhythm: Normal rate and regular rhythm.     Pulses: Normal pulses.     Heart sounds: Normal heart sounds.  Pulmonary:     Effort: Pulmonary effort is normal. No respiratory distress.     Breath sounds: Normal breath sounds.  Abdominal:     General: Bowel sounds are normal. There is no distension.     Palpations: Abdomen is soft.     Tenderness: There is no abdominal tenderness.  Genitourinary:    Comments: Scrotum is swollen; no tenderness present; no abnormalities palpated.  Musculoskeletal:     Cervical back: Neck supple.     Right lower leg: No edema.     Left lower leg: No edema.     Comments:  Is able to move all extremities    Lymphadenopathy:     Cervical: No cervical adenopathy.  Skin:    General: Skin is warm and dry.  Neurological:     Mental Status: He is alert. Mental status is at baseline.  Psychiatric:        Mood and Affect: Mood normal.      ASSESSMENT/ PLAN:  TODAY  1. Scrotal swelling: will have nursing staff elevate scrotum while in bed and will monitor his status.   MD is aware of resident's narcotic use and is in agreement with current plan of care. We will attempt to wean resident as appropriate.  Ok Edwards NP [facility]  Contact [phone] Monday through Friday 8am- 5pm  After hours call [phone]

## 2020-07-20 ENCOUNTER — Non-Acute Institutional Stay (SKILLED_NURSING_FACILITY): Payer: Medicare Other | Admitting: Adult Health

## 2020-07-20 ENCOUNTER — Encounter (HOSPITAL_COMMUNITY)
Admission: RE | Admit: 2020-07-20 | Discharge: 2020-07-20 | Disposition: A | Discharge status: Home / Self Care | Payer: Medicare Other | Source: Skilled Nursing Facility | Attending: Adult Health | Admitting: Adult Health

## 2020-07-20 ENCOUNTER — Ambulatory Visit (HOSPITAL_COMMUNITY)
Admission: RE | Admit: 2020-07-20 | Discharge: 2020-07-20 | Disposition: A | Discharge status: Home / Self Care | Payer: Medicare Other | Source: Ambulatory Visit | Attending: Adult Health | Admitting: Adult Health

## 2020-07-20 ENCOUNTER — Ambulatory Visit (HOSPITAL_COMMUNITY): Payer: Medicare Other

## 2020-07-20 ENCOUNTER — Encounter: Payer: Self-pay | Admitting: Adult Health

## 2020-07-20 DIAGNOSIS — J90 Pleural effusion, not elsewhere classified: Secondary | ICD-10-CM | POA: Insufficient documentation

## 2020-07-20 DIAGNOSIS — R0602 Shortness of breath: Secondary | ICD-10-CM | POA: Diagnosis present

## 2020-07-20 DIAGNOSIS — J189 Pneumonia, unspecified organism: Secondary | ICD-10-CM | POA: Diagnosis present

## 2020-07-20 DIAGNOSIS — R7989 Other specified abnormal findings of blood chemistry: Secondary | ICD-10-CM

## 2020-07-20 DIAGNOSIS — R0600 Dyspnea, unspecified: Secondary | ICD-10-CM | POA: Insufficient documentation

## 2020-07-20 LAB — CBC WITH DIFFERENTIAL/PLATELET
Abs Immature Granulocytes: 0.01 10*3/uL (ref 0.00–0.07)
Basophils Absolute: 0.1 10*3/uL (ref 0.0–0.1)
Basophils Relative: 2 %
Eosinophils Absolute: 0.1 10*3/uL (ref 0.0–0.5)
Eosinophils Relative: 2 %
HCT: 41.2 % (ref 39.0–52.0)
Hemoglobin: 13 g/dL (ref 13.0–17.0)
Immature Granulocytes: 0 %
Lymphocytes Relative: 11 %
Lymphs Abs: 0.6 10*3/uL — ABNORMAL LOW (ref 0.7–4.0)
MCH: 28.6 pg (ref 26.0–34.0)
MCHC: 31.6 g/dL (ref 30.0–36.0)
MCV: 90.7 fL (ref 80.0–100.0)
Monocytes Absolute: 0.5 10*3/uL (ref 0.1–1.0)
Monocytes Relative: 10 %
Neutro Abs: 3.8 10*3/uL (ref 1.7–7.7)
Neutrophils Relative %: 75 %
Platelets: 176 10*3/uL (ref 150–400)
RBC: 4.54 MIL/uL (ref 4.22–5.81)
RDW: 15.5 % (ref 11.5–15.5)
WBC: 5.1 10*3/uL (ref 4.0–10.5)
nRBC: 0 % (ref 0.0–0.2)

## 2020-07-20 LAB — BASIC METABOLIC PANEL
Anion gap: 6 (ref 5–15)
BUN: 25 mg/dL — ABNORMAL HIGH (ref 8–23)
CO2: 28 mmol/L (ref 22–32)
Calcium: 8.7 mg/dL — ABNORMAL LOW (ref 8.9–10.3)
Chloride: 95 mmol/L — ABNORMAL LOW (ref 98–111)
Creatinine, Ser: 0.52 mg/dL — ABNORMAL LOW (ref 0.61–1.24)
GFR calc Af Amer: >60 mL/min (ref >60)
GFR calc non Af Amer: >60 mL/min (ref >60)
Glucose, Bld: 110 mg/dL — ABNORMAL HIGH (ref 70–99)
Potassium: 4.5 mmol/L (ref 3.5–5.1)
Sodium: 129 mmol/L — ABNORMAL LOW (ref 135–145)

## 2020-07-20 LAB — BRAIN NATRIURETIC PEPTIDE: B Natriuretic Peptide: 540 pg/mL — ABNORMAL HIGH (ref 0.0–100.0)

## 2020-07-20 NOTE — Progress Notes (Signed)
Location:    [place] Room Number: 127/D Place of Service:  SNF (31)   CODE STATUS: DNR  Allergies  Allergen Reactions  . Other   . Penicillin G   . Sulfonic Acid (3,5-Dibromo-4-H-Ox-Benz)   . Penicillins Rash  . Sulfonamide Derivatives Rash  . Tetanus Toxoid Rash    Chief Complaint  Patient presents with  . Acute Visit    Status    HPI:  He is unable to maintain his 02 sat on room air with his sats at 88% on 3 liters is 96%. He is complaining of worsening shortness of breath; denies any cough; chest pain. Is unable to breath easily with head of bed flat.  does have worsening peripheral edema present. No fevers present.   Past Medical History:  Diagnosis Date  . Cancer (HCC)    melanoma-left side  . Chronic pain    legs and feet  . Coronary artery disease   . Dementia ([place])   . Diabetes mellitus without complication ([place])   . Foot drop   . GERD (gastroesophageal reflux disease)   . Gout   . Hyperlipidemia   . Hypertension   . OCD (obsessive compulsive disorder)   . PONV (postoperative nausea and vomiting)   . Psychosis ([place])   . Reflux   . Sarcoidosis   . Sleep apnea    cpap    Past Surgical History:  Procedure Laterality Date  . BACK SURGERY     cervical neck  . CATARACT EXTRACTION W/PHACO  01/05/2012   Procedure: CATARACT EXTRACTION PHACO AND INTRAOCULAR LENS PLACEMENT (IOC);  Surgeon: [name] Branch, MD;  Location: [facility];  Service: Ophthalmology;  Laterality: Left;  CDE 18.47  . CATARACT EXTRACTION W/PHACO  02/02/2012   Procedure: CATARACT EXTRACTION PHACO AND INTRAOCULAR LENS PLACEMENT (IOC);  Surgeon: [name] Branch, MD;  Location: [facility];  Service: Ophthalmology;  Laterality: Right;  CDE:15.38  . CHOLECYSTECTOMY    . GALLBLADDER SURGERY    . hemorhoidectomy    . MELANOMA EXCISION     left side-[name]  . TONSILLECTOMY      Social History   Socioeconomic History  . Marital status: Married    Spouse name: Not on file  .  Number of children: Not on file  . Years of education: Not on file  . Highest education level: Not on file  Occupational History  . Occupation: retired   Tobacco Use  . Smoking status: Former Smoker    Packs/day: 0.25    Years: 20.00    Pack years: 5.00    Types: Pipe    Quit date: 01/02/1974    Years since quitting: 46.5  . Smokeless tobacco: Never Used  Vaping Use  . Vaping Use: Never used  Substance and Sexual Activity  . Alcohol use: No  . Drug use: No  . Sexual activity: Not Currently  Other Topics Concern  . Not on file  Social History Narrative   Former pipe smoker, quit many years ago.   Long term resident of [facility]      Social Determinants of Health   Financial Resource Strain: Low Risk   . Difficulty of Paying Living Expenses: Not hard at all  Food Insecurity: No Food Insecurity  . Worried About Charity fundraiser in the Last Year: Never true  . Ran Out of Food in the Last Year: Never true  Transportation Needs: No Transportation Needs  . Lack of Transportation (Medical): No  . Lack  of Transportation (Non-Medical): No  Physical Activity: Inactive  . Days of Exercise per Week: 0 days  . Minutes of Exercise per Session: 0 min  Stress: No Stress Concern Present  . Feeling of Stress : Not at all  Social Connections: Socially Isolated  . Frequency of Communication with Friends and Family: Never  . Frequency of Social Gatherings with Friends and Family: Never  . Attends Religious Services: Never  . Active Member of Clubs or Organizations: No  . Attends Archivist Meetings: Not asked  . Marital Status: Married  Human resources officer Violence: Not At Risk  . Fear of Current or Ex-Partner: No  . Emotionally Abused: No  . Physically Abused: No  . Sexually Abused: No   Family History  Problem Relation Age of Onset  . Heart disease Other   . Arthritis Other   . Cancer Other   . Anesthesia problems Neg Hx   . Hypotension Neg Hx   . Malignant hyperthermia Neg  Hx   . Pseudochol deficiency Neg Hx       VITAL SIGNS BP 100/66   Pulse (!) 54   Temp 99 F (37.2 C) (Oral)   Resp 20   Ht 5\' 9"  (1.753 m)   Wt 210 lb 9.6 oz (95.5 kg)   SpO2 94%   BMI 31.10 kg/m   Outpatient Encounter Medications as of 07/20/2020  Medication Sig  . acetaminophen (TYLENOL) 500 MG tablet Take 500 mg by mouth every 6 (six) hours as needed. For pain   . allopurinol (ZYLOPRIM) 300 MG tablet Take 300 mg by mouth daily.    [name] EC 81 MG tablet Take 81 mg by mouth daily.  [name] (LIPITOR) 20 MG tablet Take 20 mg by mouth every evening.  Roseanne Kaufman [employer] [facility]) OINT Special Instructions: Apply to sacrum/coccyx and bilateral buttocks qshift prn erythema.  . cetirizine (ZYRTEC ALLERGY) 10 MG tablet Take 10 mg by mouth at bedtime. Special Instructions: chronic allergic rhinitis  . donepezil (ARICEPT) 10 MG tablet Take 10 mg by mouth at bedtime.   . furosemide (LASIX) 40 MG tablet Take 40 mg by mouth.  . gabapentin (NEURONTIN) 100 MG capsule Take 100 mg by mouth at bedtime.  [name] ipratropium-albuterol (DUONEB) 0.5-2.5 (3) MG/3ML SOLN Take 3 mLs by nebulization every 6 (six) hours as needed.  [name] (ZESTRIL) 5 MG tablet Take 1 tablet (5 mg total) by mouth daily.  . meloxicam (MOBIC) 15 MG tablet Take 15 mg by mouth daily.   . NON FORMULARY Diet: _____ Regular, __X____ NAS, _______Consistent Carbohydrate, _______NPO _____Other   Diet - Liquids: _X__Regular; ___Thickened ___ Consistency: ___ Nectar, ___Honey, ___ Pudding; ____ Fluid Restriction  . NON FORMULARY C-PAP from home, use home settings while sleeping At Bedtime 08:00 PM  clean CPAP mask and tubing w/baby shampoorinse well w/warm water weekly and PRN Once A Day on Tue 03:15 PM - 11:15 PM  . pantoprazole (PROTONIX) 40 MG tablet Take 1 tablet (40 mg total) by mouth daily.  . Vitamins A & D (VITAMIN A & D) ointment 3 (three) times daily as needed for dry skin. Apply A&D ointment to left  heel qshift & prn for erythema.  . [DISCONTINUED] amLODipine (NORVASC) 5 MG tablet Take 1 tablet (5 mg total) by mouth daily.   No facility-administered encounter medications on file as of 07/20/2020.     SIGNIFICANT DIAGNOSTIC EXAMS  PREVIOUS;   06-19-19: chest x-Deveon: Minimal right basilar subsegmental atelectasis  06-19-19: ct of head: Mild diffuse cortical atrophy. No acute intracranial abnormality seen.  06-19-19: ct of lumbar spine:  1. No acute/traumatic lumbar spine pathology. 2. Osteopenia with extensive multilevel degenerative changes of the spine. 3. Lumbar levoscoliosis and multilevel disc bulge and neural foraminal narrowing. MRI may provide better evaluation. Aortic Atherosclerosis   06-22-19: lumbar spine x-Januel: 1. Degenerative changes. 2. No evidence for acute abnormality   12-17-19: CT of head and cervical spine:  1. No acute intracranial pathology. Small-vessel white matter disease. 2. No fracture or static subluxation of the cervical spine. 3. Anterior cervical discectomy and fusion of C4 through C6, with incorporation of the disc spaces and bony ankylosis of the included levels inferior to this through T3. 4. Prominent osteophytes, disc calcifications and calcifications of the ligamentum flavum, which appear to significantly narrow the cervical canal at C3-C4, minimum AP diameter approximately 4 mm.  02-13-20: MRI lumbar spine:  1. L2 fracture involving the anterior wall and superior endplate, likely subacute. The appearance suggest a hyperextension mechanism. No height loss. 2. L3-L4 severe spinal canal stenosis with severe right and moderate left neural foraminal stenosis. 3. T12-L1 moderate spinal canal stenosis and severe bilateral neural foraminal stenosis. 4. L1-L2 and L2-L3 severe right neural foraminal stenosis. 5. L5-S1 moderate bilateral neural foraminal stenosis.   02-13-20: MRI cervical spine:  1. 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 2. C4-6 ACDF without spinal canal stenosis. 3. Mild bilateral C6-7 neural foraminal stenosis.  03-20-20: Myoview:  No diagnostic ST segment changes to indicate ischemia. Small, moderate intensity, apical septal and apical to basal inferolateral defects. The inferolateral defect is fixed and consistent with possible scar and the apical septal defect is partially reversible consistent with a mild ischemic territory.This is a high risk study based on calculated LVEF. Would suggest confirmatory echocardiogram. Nuclear stress EF: 15%  04-10-20 2-d echo:  Left ventricular ejection fraction, by estimation, is 55 to 60%. The  left ventricle has normal function. The left ventricle has no regional  wall motion abnormalities. Left ventricular diastolic parameters are  consistent with age-related delayed  relaxation (normal).   TODAY  07-20-20: chest x-[place]ck[place]:   Opacification of most of the right hemithorax due to a combination of pleural effusion and consolidation. There is also consolidation in the medial left base. Heart borderline enlarged. There are foci of left carotid artery calcification.    LABS REVIEWED PREVIOUS;   08-23-19: hgb a1c 5.6  10-05-19: wbc 4.8; hgb 11.8; hct 36.2; mcv 89.8 plt 256; glucose 100; bun 18; creat 0.53 ;k+ 3.9; an++ 128; ca 8.6; d-dimer: 3.36 ferritin 965 10-16-19: glucose 96; bun 14; creat 0.47; k+ 3.7; na++ 134; ca 8.5 12-02-19: wbc 5.9; hgb 12.4; hct 38.9; mcv 91.1 plt 227; chol 96; ldl 43; trig 45; hdl 44  01-30-20: chol 96; ldl 43; trig 45; hdl 44  06-11-20: wbc 5.5; hgb 12.5; hct 39.0 mcv 92.6 plt 184; glucose 99; bun 26; creat 0.68; k+ 4.6; na++ 131; ca 8.9 liver normal albumin 3.5 hgb a1c 5.5; tsh 2.617  TODAY  07-20-20: wbc 5.1; hgb 13.0; hct 41.2; mcv 90.7 plt 176; glucose 110; bun 25; creat 0.52; k+ 4.5; na++ 129; ca 8.7   Review of Systems  Constitutional:  Negative for malaise/fatigue.  Respiratory: Positive for shortness of breath. Negative for cough.   Cardiovascular: Positive for orthopnea and leg swelling. Negative for chest pain and palpitations.  Gastrointestinal: Negative for abdominal pain, constipation and heartburn.  Musculoskeletal: Negative for back pain, joint pain and myalgias.  Skin: Negative.   Neurological: Negative for dizziness.  Psychiatric/Behavioral: The patient is not nervous/anxious.      Physical Exam Constitutional:      General: He is not in acute distress.    Appearance: He is well-developed. He is not diaphoretic.  Neck:     Thyroid: No thyromegaly.  Cardiovascular:     Rate and Rhythm: Normal rate and regular rhythm.     Pulses: Normal pulses.     Heart sounds: Normal heart sounds.  Pulmonary:     Effort: No respiratory distress.     Comments: Requires 02 at 3L Significant decreased breath sounds on right side  Abdominal:     General: Bowel sounds are normal. There is no distension.     Palpations: Abdomen is soft.     Tenderness: There is no abdominal tenderness.  Genitourinary:    Comments: Scrotum is swollen; no tenderness present; no abnormalities palpated. Musculoskeletal:     Cervical back: Neck supple.     Right lower leg: Edema present.     Left lower leg: Edema present.     Comments: 2-3+ bilateral lower extremity edema starting at thighs  Lymphadenopathy:     Cervical: No cervical adenopathy.  Skin:    General: Skin is warm and dry.  Neurological:     Mental Status: He is alert. Mental status is at baseline.  Psychiatric:        Mood and Affect: Mood normal.     ASSESSMENT/ PLAN:  TODAY  1. HCAP (heath care associated pneumonia) 2 right pleural effusion 3. Elevated brain natriuretic peptide (BNP)   Status is worse Cbc; bmp bnp chest x-Remigio have been done  Will begin levaquin 750 mg daily through 08-02-20.  Will begin lasix 40 mg daily  Will get 2-d echo Will setup  thoracentesis on right side Will repeat bmp in the AM with cbc and will repeat BMP on 07-27-20  MD is aware of resident's narcotic use and is in agreement with current plan of care. We will attempt to wean resident as appropriate.  Ok Edwards NP [facility]  Contact [phone] Monday through Friday 8am- 5pm  After hours call [phone]

## 2020-07-21 ENCOUNTER — Other Ambulatory Visit (HOSPITAL_COMMUNITY)
Admission: RE | Admit: 2020-07-21 | Discharge: 2020-07-21 | Disposition: A | Discharge status: Home / Self Care | Payer: Medicare Other | Source: Skilled Nursing Facility | Attending: Adult Health | Admitting: Adult Health

## 2020-07-21 ENCOUNTER — Non-Acute Institutional Stay (SKILLED_NURSING_FACILITY): Payer: Medicare Other | Admitting: Adult Health

## 2020-07-21 DIAGNOSIS — I10 Essential (primary) hypertension: Secondary | ICD-10-CM | POA: Diagnosis present

## 2020-07-21 DIAGNOSIS — J90 Pleural effusion, not elsewhere classified: Secondary | ICD-10-CM

## 2020-07-21 DIAGNOSIS — I4891 Unspecified atrial fibrillation: Secondary | ICD-10-CM

## 2020-07-21 DIAGNOSIS — J189 Pneumonia, unspecified organism: Secondary | ICD-10-CM

## 2020-07-21 LAB — CBC WITH DIFFERENTIAL/PLATELET
Abs Immature Granulocytes: 0.02 10*3/uL (ref 0.00–0.07)
Basophils Absolute: 0.1 10*3/uL (ref 0.0–0.1)
Basophils Relative: 2 %
Eosinophils Absolute: 0.2 10*3/uL (ref 0.0–0.5)
Eosinophils Relative: 3 %
HCT: 42 % (ref 39.0–52.0)
Hemoglobin: 13.5 g/dL (ref 13.0–17.0)
Immature Granulocytes: 0 %
Lymphocytes Relative: 12 %
Lymphs Abs: 0.7 10*3/uL (ref 0.7–4.0)
MCH: 29.3 pg (ref 26.0–34.0)
MCHC: 32.1 g/dL (ref 30.0–36.0)
MCV: 91.1 fL (ref 80.0–100.0)
Monocytes Absolute: 0.5 10*3/uL (ref 0.1–1.0)
Monocytes Relative: 9 %
Neutro Abs: 4.2 10*3/uL (ref 1.7–7.7)
Neutrophils Relative %: 74 %
Platelets: 171 10*3/uL (ref 150–400)
RBC: 4.61 MIL/uL (ref 4.22–5.81)
RDW: 15.4 % (ref 11.5–15.5)
WBC: 5.7 10*3/uL (ref 4.0–10.5)
nRBC: 0 % (ref 0.0–0.2)

## 2020-07-21 LAB — BASIC METABOLIC PANEL
Anion gap: 10 (ref 5–15)
BUN: 21 mg/dL (ref 8–23)
CO2: 27 mmol/L (ref 22–32)
Calcium: 9 mg/dL (ref 8.9–10.3)
Chloride: 95 mmol/L — ABNORMAL LOW (ref 98–111)
Creatinine, Ser: 0.58 mg/dL — ABNORMAL LOW (ref 0.61–1.24)
GFR calc Af Amer: >60 mL/min (ref >60)
GFR calc non Af Amer: >60 mL/min (ref >60)
Glucose, Bld: 106 mg/dL — ABNORMAL HIGH (ref 70–99)
Potassium: 4.1 mmol/L (ref 3.5–5.1)
Sodium: 132 mmol/L — ABNORMAL LOW (ref 135–145)

## 2020-07-21 LAB — D-DIMER, QUANTITATIVE: D-Dimer, Quant: 1.87 ug/mL-FEU — ABNORMAL HIGH (ref 0.00–0.50)

## 2020-07-22 ENCOUNTER — Encounter: Payer: Self-pay | Admitting: Adult Health

## 2020-07-22 ENCOUNTER — Encounter: Payer: Self-pay | Admitting: Internal Medicine

## 2020-07-22 ENCOUNTER — Non-Acute Institutional Stay (SKILLED_NURSING_FACILITY): Payer: Medicare Other | Admitting: Internal Medicine

## 2020-07-22 DIAGNOSIS — I4891 Unspecified atrial fibrillation: Secondary | ICD-10-CM | POA: Insufficient documentation

## 2020-07-22 DIAGNOSIS — Z862 Personal history of diseases of the blood and blood-forming organs and certain disorders involving the immune mechanism: Secondary | ICD-10-CM

## 2020-07-22 DIAGNOSIS — Z85820 Personal history of malignant melanoma of skin: Secondary | ICD-10-CM | POA: Diagnosis not present

## 2020-07-22 DIAGNOSIS — G4733 Obstructive sleep apnea (adult) (pediatric): Secondary | ICD-10-CM

## 2020-07-22 DIAGNOSIS — F0150 Vascular dementia without behavioral disturbance: Secondary | ICD-10-CM

## 2020-07-22 DIAGNOSIS — J90 Pleural effusion, not elsewhere classified: Secondary | ICD-10-CM

## 2020-07-22 DIAGNOSIS — U071 COVID-19: Secondary | ICD-10-CM

## 2020-07-22 DIAGNOSIS — Z9989 Dependence on other enabling machines and devices: Secondary | ICD-10-CM

## 2020-07-22 NOTE — Assessment & Plan Note (Addendum)
04/10/2020 normal pulmonary artery systolic pressure on ECHO. 07/22/2020 patient states he has not used CPAP for a year due to "missing pieces".

## 2020-07-22 NOTE — Progress Notes (Signed)
Location:    [facility] Room Number: 127/D Place of Service:  SNF (31)   CODE STATUS: DNR  Allergies  Allergen Reactions  . Other   . Penicillin G   . Sulfonic Acid (3,5-Dibromo-4-H-Ox-Benz)   . Penicillins Rash  . Sulfonamide Derivatives Rash  . Tetanus Toxoid Rash    Chief Complaint  Patient presents with  . Follow-up    Follow Up Status    HPI:  He is able to get out of bed to wheelchair today. His heart rate is now irregular; this is new. He continues with bilateral lower extremity edema. He does have shortness of breath present. Denies any cough. No reports of fevers present. His chest x-Josealberto yesterday has a right lung white out. His left side has rhonchi and rales present. He is slightly confused today. I have spoken with Dr. Linna Darner regarding his status.   Past Medical History:  Diagnosis Date  . Cancer (HCC)    melanoma-left side  . Chronic pain    legs and feet  . Coronary artery disease   . Dementia ([place])   . Diabetes mellitus without complication ([place])   . Foot drop   . GERD (gastroesophageal reflux disease)   . Gout   . Hyperlipidemia   . Hypertension   . OCD (obsessive compulsive disorder)   . PONV (postoperative nausea and vomiting)   . Psychosis ([place])   . Reflux   . Sarcoidosis   . Sleep apnea    cpap    Past Surgical History:  Procedure Laterality Date  . BACK SURGERY     cervical neck  . CATARACT EXTRACTION W/PHACO  01/05/2012   Procedure: CATARACT EXTRACTION PHACO AND INTRAOCULAR LENS PLACEMENT (IOC);  Surgeon: [name] Branch, MD;  Location: [facility];  Service: Ophthalmology;  Laterality: Left;  CDE 18.47  . CATARACT EXTRACTION W/PHACO  02/02/2012   Procedure: CATARACT EXTRACTION PHACO AND INTRAOCULAR LENS PLACEMENT (IOC);  Surgeon: [name] Branch, MD;  Location: [facility];  Service: Ophthalmology;  Laterality: Right;  CDE:15.38  . CHOLECYSTECTOMY    . GALLBLADDER SURGERY    . hemorhoidectomy    . MELANOMA EXCISION     left  side-[name]  . TONSILLECTOMY      Social History   Socioeconomic History  . Marital status: Married    Spouse name: Not on file  . Number of children: Not on file  . Years of education: Not on file  . Highest education level: Not on file  Occupational History  . Occupation: retired   Tobacco Use  . Smoking status: Former Smoker    Packs/day: 0.25    Years: 20.00    Pack years: 5.00    Types: Pipe    Quit date: 01/02/1974    Years since quitting: 46.5  . Smokeless tobacco: Never Used  Vaping Use  . Vaping Use: Never used  Substance and Sexual Activity  . Alcohol use: No  . Drug use: No  . Sexual activity: Not Currently  Other Topics Concern  . Not on file  Social History Narrative   Former pipe smoker, quit many years ago.   Long term resident of [facility]      Social Determinants of Health   Financial Resource Strain: Low Risk   . Difficulty of Paying Living Expenses: Not hard at all  Food Insecurity: No Food Insecurity  . Worried About Charity fundraiser in the Last Year: Never true  . Ran Out of Food in  the Last Year: Never true  Transportation Needs: No Transportation Needs  . Lack of Transportation (Medical): No  . Lack of Transportation (Non-Medical): No  Physical Activity: Inactive  . Days of Exercise per Week: 0 days  . Minutes of Exercise per Session: 0 min  Stress: No Stress Concern Present  . Feeling of Stress : Not at all  Social Connections: Socially Isolated  . Frequency of Communication with Friends and Family: Never  . Frequency of Social Gatherings with Friends and Family: Never  . Attends Religious Services: Never  . Active Member of Clubs or Organizations: No  . Attends Archivist Meetings: Not asked  . Marital Status: Married  Human resources officer Violence: Not At Risk  . Fear of Current or Ex-Partner: No  . Emotionally Abused: No  . Physically Abused: No  . Sexually Abused: No   Family History  Problem Relation Age of Onset  .  Heart disease Other   . Arthritis Other   . Cancer Other   . Anesthesia problems Neg Hx   . Hypotension Neg Hx   . Malignant hyperthermia Neg Hx   . Pseudochol deficiency Neg Hx       VITAL SIGNS BP 121/81   Pulse (!) 112   Temp (!) 96.3 F (35.7 C)   Resp 20   Ht 5\' 9"  (1.753 m)   Wt 210 lb 9.6 oz (95.5 kg)   SpO2 94%   BMI 31.10 kg/m   Outpatient Encounter Medications as of 07/22/2020  Medication Sig  . acetaminophen (TYLENOL) 500 MG tablet Take 500 mg by mouth every 6 (six) hours as needed. For pain   . allopurinol (ZYLOPRIM) 300 MG tablet Take 300 mg by mouth daily.    [name] apixaban (ELIQUIS) 5 MG TABS tablet Take 5 mg by mouth 2 (two) times daily.  [name] EC 81 MG tablet Take 81 mg by mouth daily.  [name] (LIPITOR) 20 MG tablet Take 20 mg by mouth every evening.  Roseanne Kaufman [place]-Castor Oil [facility] An) OINT Special Instructions: Apply to sacrum/coccyx and bilateral buttocks qshift prn erythema.  . cetirizine (ZYRTEC ALLERGY) 10 MG tablet Take 10 mg by mouth at bedtime. Special Instructions: chronic allergic rhinitis  . donepezil (ARICEPT) 10 MG tablet Take 10 mg by mouth at bedtime.   . gabapentin (NEURONTIN) 100 MG capsule Take 100 mg by mouth at bedtime.  [name] ipratropium-albuterol (DUONEB) 0.5-2.5 (3) MG/3ML SOLN Take 3 mLs by nebulization every 6 (six) hours as needed.  [name] levofloxacin (LEVAQUIN) 25 MG/ML solution Take 750 mg by mouth daily.  [name] (ZESTRIL) 5 MG tablet Take 1 tablet (5 mg total) by mouth daily.  . meloxicam (MOBIC) 15 MG tablet Take 15 mg by mouth daily.   . NON FORMULARY Diet: _____ Regular, __X____ NAS, _______Consistent Carbohydrate, _______NPO _____Other   Diet - Liquids: _X__Regular; ___Thickened ___ Consistency: ___ Nectar, ___Honey, ___ Pudding; ____ Fluid Restriction  . NON FORMULARY C-PAP from home, use home settings while sleeping At Bedtime 08:00 PM  clean CPAP mask and tubing w/baby shampoorinse well w/warm water weekly and  PRN Once A Day on Tue 03:15 PM - 11:15 PM  . pantoprazole (PROTONIX) 40 MG tablet Take 1 tablet (40 mg total) by mouth daily.  [name] torsemide (DEMADEX) 20 MG tablet Take 40 mg by mouth daily.  . Vitamins A & D (VITAMIN A & D) ointment 3 (three) times daily as needed for dry skin. Apply A&D ointment to left heel qshift &  prn for erythema.  . [DISCONTINUED] furosemide (LASIX) 40 MG tablet Take 40 mg by mouth.   No facility-administered encounter medications on file as of 07/22/2020.     SIGNIFICANT DIAGNOSTIC EXAMS   PREVIOUS;   06-19-19: chest x-[place]ard: Minimal right basilar subsegmental atelectasis   06-19-19: ct of head: Mild diffuse cortical atrophy. No acute intracranial abnormality seen.  06-19-19: ct of lumbar spine:  1. No acute/traumatic lumbar spine pathology. 2. Osteopenia with extensive multilevel degenerative changes of the spine. 3. Lumbar levoscoliosis and multilevel disc bulge and neural foraminal narrowing. MRI may provide better evaluation. Aortic Atherosclerosis   06-22-19: lumbar spine x-Daisean: 1. Degenerative changes. 2. No evidence for acute abnormality   12-17-19: CT of head and cervical spine:  1. No acute intracranial pathology. Small-vessel white matter disease. 2. No fracture or static subluxation of the cervical spine. 3. Anterior cervical discectomy and fusion of C4 through C6, with incorporation of the disc spaces and bony ankylosis of the included levels inferior to this through T3. 4. Prominent osteophytes, disc calcifications and calcifications of the ligamentum flavum, which appear to significantly narrow the cervical canal at C3-C4, minimum AP diameter approximately 4 mm.  02-13-20: MRI lumbar spine:  1. L2 fracture involving the anterior wall and superior endplate, likely subacute. The appearance suggest a hyperextension mechanism. No height loss. 2. L3-L4 severe spinal canal stenosis with severe right and moderate left neural foraminal stenosis. 3. T12-L1  moderate spinal canal stenosis and severe bilateral neural foraminal stenosis. 4. L1-L2 and L2-L3 severe right neural foraminal stenosis. 5. L5-S1 moderate bilateral neural foraminal stenosis.   02-13-20: MRI cervical spine:  1. 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 2. C4-6 ACDF without spinal canal stenosis. 3. Mild bilateral C6-7 neural foraminal stenosis.  03-20-20: Myoview:  No diagnostic ST segment changes to indicate ischemia. Small, moderate intensity, apical septal and apical to basal inferolateral defects. The inferolateral defect is fixed and consistent with possible scar and the apical septal defect is partially reversible consistent with a mild ischemic territory.This is a high risk study based on calculated LVEF. Would suggest confirmatory echocardiogram. Nuclear stress EF: 15%  04-10-20 2-d echo:  Left ventricular ejection fraction, by estimation, is 55 to 60%. The  left ventricle has normal function. The left ventricle has no regional  wall motion abnormalities. Left ventricular diastolic parameters are  consistent with age-related delayed  relaxation (normal).   07-20-20: chest x-Mukhtar:   Opacification of most of the right hemithorax due to a combination of pleural effusion and consolidation. There is also consolidation in the medial left base. Heart borderline enlarged. There are foci of left carotid artery calcification.  NO NEW EXAMS.     LABS REVIEWED PREVIOUS;   08-23-19: hgb a1c 5.6  10-05-19: wbc 4.8; hgb 11.8; hct 36.2; mcv 89.8 plt 256; glucose 100; bun 18; creat 0.53 ;k+ 3.9; an++ 128; ca 8.6; d-dimer: 3.36 ferritin 965 10-16-19: glucose 96; bun 14; creat 0.47; k+ 3.7; na++ 134; ca 8.5 12-02-19: wbc 5.9; hgb 12.4; hct 38.9; mcv 91.1 plt 227; chol 96; ldl 43; trig 45; hdl 44  01-30-20: chol 96; ldl 43; trig 45; hdl 44  06-11-20: wbc 5.5; hgb 12.5; hct  39.0 mcv 92.6 plt 184; glucose 99; bun 26; creat 0.68; k+ 4.6; na++ 131; ca 8.9 liver normal albumin 3.5 hgb a1c 5.5; tsh 2.617 07-20-20: wbc 5.1; hgb 13.0; hct 41.2; mcv 90.7 plt  176; glucose 110; bun 25; creat 0.52; k+ 4.5; na++ 129; ca 8.7   TODAY  07-21-20: wbc 5.7; hgb 13.5; hct 42.0 mcv 91.1 plt 171; glucose 106; bun 21; creat 0.58; k+ 4.1; na++ 132; ca 9.0   Review of Systems  Constitutional: Negative for malaise/fatigue.  Respiratory: Positive for shortness of breath. Negative for cough.   Cardiovascular: Positive for orthopnea and leg swelling. Negative for chest pain and palpitations.  Gastrointestinal: Negative for abdominal pain, constipation and heartburn.  Musculoskeletal: Negative for back pain, joint pain and myalgias.  Skin: Negative.   Neurological: Negative for dizziness.  Psychiatric/Behavioral: The patient is not nervous/anxious.     Physical Exam Constitutional:      General: He is not in acute distress.    Appearance: He is well-developed. He is not diaphoretic.  Neck:     Thyroid: No thyromegaly.  Cardiovascular:     Rate and Rhythm: Normal rate and regular rhythm.     Pulses: Normal pulses.     Heart sounds: Normal heart sounds.  Pulmonary:     Effort: Pulmonary effort is normal. No respiratory distress.     Breath sounds: Rhonchi and rales present.     Comments: Requires 02 at 3L Significant decreased breath sounds on right side  Abdominal:     General: Bowel sounds are normal. There is no distension.     Palpations: Abdomen is soft.     Tenderness: There is no abdominal tenderness.  Genitourinary:    Comments: Scrotum is swollen; no tenderness present; no abnormalities palpated. Musculoskeletal:     Cervical back: Neck supple.     Right lower leg: Edema present.     Left lower leg: Edema present.     Comments: 2-3+ bilateral lower extremity edema starting at thighs   Lymphadenopathy:     Cervical: No cervical adenopathy.  Skin:    General: Skin  is warm and dry.  Neurological:     Mental Status: He is alert and oriented to person, place, and time.       ASSESSMENT/ PLAN:    MD is aware of resident's narcotic use and is in agreement with current plan of care. We will attempt to wean resident as appropriate.  Ok Edwards NP [facility]  Contact [phone] Monday through Friday 8am- 5pm  After hours call [phone]   This encounter was created in error - please disregard.

## 2020-07-22 NOTE — Progress Notes (Signed)
NURSING HOME LOCATION: [facility] ROOM NUMBER: 127/D    CODE STATUS: DNR   PCP:  Gerlene Fee, NP  This is a nursing facility follow up for specific acute issue of massive R pleural effusion.  Interim medical record and care since last [place] visit was updated with review of diagnostic studies and change in clinical status since last visit were documented.  HPI: He was exhibiting hypoxia with O2 sats of 88% on 3 L/ min with associated progressive dyspnea & peripheral edema.  Chest x-Otilio 9/20 revealed a large right pleural effusion with opacification of most of the right hemithorax.  There is also consolidation suggested.  Some consolidation was also suggested at the left medial base.  Borderline cardiomegaly was present.BNP was 540. Echo 6/11 had revealed normal EF of 55-60%, borderline LVH, and normal pulmonary artery systolic pressure despite his history of sleep apnea.   Interventional Radiology was consulted but thoracentesis could not be completed due to inability to position the patient adequately.  Significant past pulmonary history includes history of sarcoidosis as well as sleep apnea with employment of CPAP.  In December 2020 he was treated with Bamlanivimab infusion for mild-moderate COVID-19 infection by Dr. Asencion Noble, Pulmonology.  At that time his D-dimer was 3.36.  Repeat D-dimer 9/21 was 1.87.  Eliquis was initiated empirically pending further evaluation of etiology of massive effusion.  Significant PMH includes a history of melanoma of L flank,dyslipidemia, CAD, OCD/psychosis. The L sided melanoma was resected by Dr Marnette Burgess.  He smoked until 1975 up to 1/4 ppd X 20 years as well as a pipe.  Review of systems: He initially was unable to give me  the date.  Subsequently he did so correctly.  His responses are questionable as he has a diagnosis of dementia.  He tells me that his breathing has been "shallow" for 2 months.  He denies any  constitutional symptoms or other active pulmonary symptoms such as sputum production.  He states he has not used his CPAP for a year because he is "missing pieces".  He could not give me any history about having had sarcoidosis.  Initially he denied having seen a Pulmonologist but subsequently identified Dr. Luan Pulling.  He denied having Covid last year. He pointed to the scar over the left flank when I ask if he had had melanoma.  He identified the surgeon as Dr. Eugenie Birks.  Constitutional: No fever, significant weight change  Eyes: No redness, discharge, pain, vision change ENT/mouth: No nasal congestion,  purulent discharge, earache,sore throat  Cardiovascular: No chest pain, palpitations, paroxysmal nocturnal dyspnea  Respiratory: No cough, sputum production, hemoptysis, Gastrointestinal: No heartburn, dysphagia, abdominal pain, nausea /vomiting, rectal bleeding, melena, change in bowels Genitourinary: No dysuria, hematuria, pyuria, incontinence, nocturia Musculoskeletal: No joint stiffness, joint swelling, weakness, pain Dermatologic: No rash, pruritus, change in appearance of skin Hematologic/lymphatic: No significant bruising, lymphadenopathy, abnormal bleeding Allergy/immunology: No itchy/watery eyes, significant sneezing, urticaria, angioedema  Physical exam:  Pertinent or positive findings: He was sitting upright in the wheelchair with his nasal oxygen displaced over the right malar area.  Hair is thick and disheveled.  He has a full mustache and beard.  Arcus senilis is present.  He is edentulous.  He is markedly hard of hearing.  Breath sounds are absent on the right. Minor low grade rales on L.  He has a distant tachycardia with slight irregularity to the rhythm.  Abdomen is protuberant.  He has an irregular operative scar over  the left flank.  Pedal pulses are decreased.  He has 1+ pitting edema over the ankles and feet.  General appearance: Adequately nourished; no acute distress,  increased work of breathing is present.   Lymphatic: No lymphadenopathy about the head, neck, axilla. Eyes: No conjunctival inflammation or lid edema is present. There is no scleral icterus. Ears:  External ear exam shows no significant lesions or deformities.   Nose:  External nasal examination shows no deformity or inflammation. Nasal mucosa are pink and moist without lesions, exudates Oral exam:  Lips and gums are healthy appearing. There is no oropharyngeal erythema or exudate. Neck:  No thyromegaly, masses, tenderness noted.    Heart:  No murmur, click, rub .  Lungs:  without wheezes, rhonchi, rubs. Abdomen: Bowel sounds are normal. Abdomen is soft and nontender with no organomegaly, hernias, masses. GU: Deferred  Extremities:  No cyanosis  Neurologic exam :Balance, Rhomberg, finger to nose testing could not be completed due to clinical state Skin: Warm & dry w/o tenting. No significant lesions or rash.  See summary under each active problem in the Problem List with associated updated therapeutic plan

## 2020-07-22 NOTE — Assessment & Plan Note (Signed)
07/22/2020 there are inconsistencies in the history he provides.  He denies any knowledge of history of sarcoidosis or prior Covid infection. NP may consider formal MMSE testing beyond BIMS.

## 2020-07-22 NOTE — Assessment & Plan Note (Addendum)
Pulmonary consultation recommended for possible bronchoscopy because of the question of associated consolidation with a massive effusion and the inability to have thoracentesis performed by IR. Significant risks include history of smoking as well as a prior history of melanoma of the left flank.

## 2020-07-22 NOTE — Assessment & Plan Note (Signed)
07/22/2020 patient denies any history of sarcoidosis.  Initially he denied having seen a Pulmonologist but subsequently identified Dr. Luan Pulling. 07/20/2020 chest x-[place] does not suggest active sarcoidosis.

## 2020-07-22 NOTE — Assessment & Plan Note (Addendum)
07/22/2020 no cervical/axillary lymphadenopathy or organomegaly on exam.

## 2020-07-22 NOTE — Progress Notes (Signed)
Location:   [facility] Room Number: 127 Place of Service:  SNF (31)   CODE STATUS: dnr  Allergies  Allergen Reactions  . Other   . Penicillin G   . Sulfonic Acid (3,5-Dibromo-4-H-Ox-Benz)   . Penicillins Rash  . Sulfonamide Derivatives Rash  . Tetanus Toxoid Rash    Chief Complaint  Patient presents with  . Acute Visit    follow up on status     HPI:  Today he is able to get out bed to wheelchair. IR is unable to complete right thoracentesis. He is on levaquin for pneumonia. His BNP is elevated at 540. He continues to have shortness of breath; no cough; has an irregular heart beat. Is 02 dependent at this time. No reports of fevers present.   Past Medical History:  Diagnosis Date  . Cancer (HCC)    melanoma-left side  . Chronic pain    legs and feet  . Coronary artery disease   . Dementia ([place])   . Diabetes mellitus without complication ([place])   . Foot drop   . GERD (gastroesophageal reflux disease)   . Gout   . Hyperlipidemia   . Hypertension   . OCD (obsessive compulsive disorder)   . PONV (postoperative nausea and vomiting)   . Psychosis ([place])   . Reflux   . Sarcoidosis   . Sleep apnea    cpap    Past Surgical History:  Procedure Laterality Date  . BACK SURGERY     cervical neck  . CATARACT EXTRACTION W/PHACO  01/05/2012   Procedure: CATARACT EXTRACTION PHACO AND INTRAOCULAR LENS PLACEMENT (IOC);  Surgeon: [name] Branch, MD;  Location: [facility];  Service: Ophthalmology;  Laterality: Left;  CDE 18.47  . CATARACT EXTRACTION W/PHACO  02/02/2012   Procedure: CATARACT EXTRACTION PHACO AND INTRAOCULAR LENS PLACEMENT (IOC);  Surgeon: [name] Branch, MD;  Location: [facility];  Service: Ophthalmology;  Laterality: Right;  CDE:15.38  . CHOLECYSTECTOMY    . GALLBLADDER SURGERY    . hemorhoidectomy    . MELANOMA EXCISION     left side-[name]  . TONSILLECTOMY      Social History   Socioeconomic History  . Marital status: Married    Spouse name: Not on  file  . Number of children: Not on file  . Years of education: Not on file  . Highest education level: Not on file  Occupational History  . Occupation: retired   Tobacco Use  . Smoking status: Former Smoker    Packs/day: 0.25    Years: 20.00    Pack years: 5.00    Types: Pipe    Quit date: 01/02/1974    Years since quitting: 46.5  . Smokeless tobacco: Never Used  Vaping Use  . Vaping Use: Never used  Substance and Sexual Activity  . Alcohol use: No  . Drug use: No  . Sexual activity: Not Currently  Other Topics Concern  . Not on file  Social History Narrative   Former pipe smoker, quit many years ago.   Long term resident of [facility]      Social Determinants of Health   Financial Resource Strain: Low Risk   . Difficulty of Paying Living Expenses: Not hard at all  Food Insecurity: No Food Insecurity  . Worried About Charity fundraiser in the Last Year: Never true  . Ran Out of Food in the Last Year: Never true  Transportation Needs: No Transportation Needs  . Lack of Transportation (Medical):  No  . Lack of Transportation (Non-Medical): No  Physical Activity: Inactive  . Days of Exercise per Week: 0 days  . Minutes of Exercise per Session: 0 min  Stress: No Stress Concern Present  . Feeling of Stress : Not at all  Social Connections: Socially Isolated  . Frequency of Communication with Friends and Family: Never  . Frequency of Social Gatherings with Friends and Family: Never  . Attends Religious Services: Never  . Active Member of Clubs or Organizations: No  . Attends Archivist Meetings: Not asked  . Marital Status: Married  Human resources officer Violence: Not At Risk  . Fear of Current or Ex-Partner: No  . Emotionally Abused: No  . Physically Abused: No  . Sexually Abused: No   Family History  Problem Relation Age of Onset  . Heart disease Other   . Arthritis Other   . Cancer Other   . Anesthesia problems Neg Hx   . Hypotension Neg Hx   . Malignant  hyperthermia Neg Hx   . Pseudochol deficiency Neg Hx       VITAL SIGNS BP (!) 98/56   Pulse 89   Resp 20   Ht 5\' 9"  (1.753 m)   Wt 210 lb (95.3 kg)   SpO2 94%   BMI 31.01 kg/m   Outpatient Encounter Medications as of 07/21/2020  Medication Sig  . acetaminophen (TYLENOL) 500 MG tablet Take 500 mg by mouth every 6 (six) hours as needed. For pain   . allopurinol (ZYLOPRIM) 300 MG tablet Take 300 mg by mouth daily.    [name] EC 81 MG tablet Take 81 mg by mouth daily.  [name] (LIPITOR) 20 MG tablet Take 20 mg by mouth every evening.  Roseanne Kaufman [place]-[employer] [facility]) OINT Special Instructions: Apply to sacrum/coccyx and bilateral buttocks qshift prn erythema.  . cetirizine (ZYRTEC ALLERGY) 10 MG tablet Take 10 mg by mouth at bedtime. Special Instructions: chronic allergic rhinitis  . donepezil (ARICEPT) 10 MG tablet Take 10 mg by mouth at bedtime.   . gabapentin (NEURONTIN) 100 MG capsule Take 100 mg by mouth at bedtime.  [name] ipratropium-albuterol (DUONEB) 0.5-2.5 (3) MG/3ML SOLN Take 3 mLs by nebulization every 6 (six) hours as needed.  [name] (ZESTRIL) 5 MG tablet Take 1 tablet (5 mg total) by mouth daily.  . meloxicam (MOBIC) 15 MG tablet Take 15 mg by mouth daily.   . NON FORMULARY Diet: _____ Regular, __X____ NAS, _______Consistent Carbohydrate, _______NPO _____Other   Diet - Liquids: _X__Regular; ___Thickened ___ Consistency: ___ Nectar, ___Honey, ___ Pudding; ____ Fluid Restriction  . NON FORMULARY C-PAP from home, use home settings while sleeping At Bedtime 08:00 PM  clean CPAP mask and tubing w/baby shampoorinse well w/warm water weekly and PRN Once A Day on Tue 03:15 PM - 11:15 PM  . pantoprazole (PROTONIX) 40 MG tablet Take 1 tablet (40 mg total) by mouth daily.  . Vitamins A & D (VITAMIN A & D) ointment 3 (three) times daily as needed for dry skin. Apply A&D ointment to left heel qshift & prn for erythema.  . [DISCONTINUED] furosemide (LASIX) 40 MG  tablet Take 40 mg by mouth.   No facility-administered encounter medications on file as of 07/21/2020.     SIGNIFICANT DIAGNOSTIC EXAMS  PREVIOUS;   06-19-19: chest x-[place]: Minimal right basilar subsegmental atelectasis   06-19-19: ct of head: Mild diffuse cortical atrophy. No acute intracranial abnormality seen.  06-19-19: ct of lumbar spine:  1. No  acute/traumatic lumbar spine pathology. 2. Osteopenia with extensive multilevel degenerative changes of the spine. 3. Lumbar levoscoliosis and multilevel disc bulge and neural foraminal narrowing. MRI may provide better evaluation. Aortic Atherosclerosis   06-22-19: lumbar spine x-[place]: 1. Degenerative changes. 2. No evidence for acute abnormality   12-17-19: CT of head and cervical spine:  1. No acute intracranial pathology. Small-vessel white matter disease. 2. No fracture or static subluxation of the cervical spine. 3. Anterior cervical discectomy and fusion of C4 through C6, with incorporation of the disc spaces and bony ankylosis of the included levels inferior to this through T3. 4. Prominent osteophytes, disc calcifications and calcifications of the ligamentum flavum, which appear to significantly narrow the cervical canal at C3-C4, minimum AP diameter approximately 4 mm.  02-13-20: MRI lumbar spine:  1. L2 fracture involving the anterior wall and superior endplate, likely subacute. The appearance suggest a hyperextension mechanism. No height loss. 2. L3-L4 severe spinal canal stenosis with severe right and moderate left neural foraminal stenosis. 3. T12-L1 moderate spinal canal stenosis and severe bilateral neural foraminal stenosis. 4. L1-L2 and L2-L3 severe right neural foraminal stenosis. 5. L5-S1 moderate bilateral neural foraminal stenosis.   02-13-20: MRI cervical spine:  1. 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 2. C4-6 ACDF without spinal canal stenosis. 3. Mild bilateral C6-7 neural foraminal stenosis.  03-20-20: Myoview:  No diagnostic ST segment changes to indicate ischemia. Small, moderate intensity, apical septal and apical to basal inferolateral defects. The inferolateral defect is fixed and consistent with possible scar and the apical septal defect is partially reversible consistent with a mild ischemic territory.This is a high risk study based on calculated LVEF. Would suggest confirmatory echocardiogram. Nuclear stress EF: 15%  04-10-20 2-d echo:  Left ventricular ejection fraction, by estimation, is 55 to 60%. The  left ventricle has normal function. The left ventricle has no regional  wall motion abnormalities. Left ventricular diastolic parameters are  consistent with age-related delayed  relaxation (normal).   07-20-20: chest x-[place]il:   Opacification of most of the right hemithorax due to a combination of pleural effusion and consolidation. There is also consolidation in the medial left base. Heart borderline enlarged. There are foci of left carotid artery calcification.  NO NEW EXAMS.     LABS REVIEWED PREVIOUS;   08-23-19: hgb a1c 5.6  10-05-19: wbc 4.8; hgb 11.8; hct 36.2; mcv 89.8 plt 256; glucose 100; bun 18; creat 0.53 ;k+ 3.9; an++ 128; ca 8.6; d-dimer: 3.36 ferritin 965 10-16-19: glucose 96; bun 14; creat 0.47; k+ 3.7; na++ 134; ca 8.5 12-02-19: wbc 5.9; hgb 12.4; hct 38.9; mcv 91.1 plt 227; chol 96; ldl 43; trig 45; hdl 44  01-30-20: chol 96; ldl 43; trig 45; hdl 44  06-11-20: wbc 5.5; hgb 12.5; hct 39.0 mcv 92.6 plt 184; glucose 99; bun 26; creat 0.68; k+ 4.6; na++ 131; ca 8.9 liver normal albumin 3.5 hgb a1c 5.5; tsh 2.617  TODAY  07-20-20: wbc 5.1; hgb 13.0; hct 41.2; mcv 90.7 plt 176; glucose 110; bun 25; creat 0.52; k+ 4.5; na++ 129; ca 8.7  07-21-20: wbc 5.7; hgb 13.5; hct 42.0; mcv 91.1 plt 171 glucose 106; bun 21; creat 0.58; k+ 4.1; na++ 132; ca 9.0   Review  of Systems  Constitutional: Negative for malaise/fatigue.  Respiratory: Positive for shortness of breath. Negative for cough.   Cardiovascular: Positive for orthopnea and leg swelling. Negative for  chest pain and palpitations.  Gastrointestinal: Negative for abdominal pain, constipation and heartburn.  Musculoskeletal: Negative for back pain, joint pain and myalgias.  Skin: Negative.   Neurological: Negative for dizziness.  Psychiatric/Behavioral: The patient is not nervous/anxious.    Physical Exam Constitutional:      General: He is not in acute distress.    Appearance: He is well-developed. He is not diaphoretic.  Neck:     Thyroid: No thyromegaly.  Cardiovascular:     Rate and Rhythm: Normal rate. Rhythm irregular.     Pulses: Normal pulses.     Heart sounds: Normal heart sounds.  Pulmonary:     Effort: Pulmonary effort is normal. No respiratory distress.     Breath sounds: Rhonchi and rales present.     Comments:  Requires 02 at 3L Significant decreased breath sounds on right side  Abdominal:     General: Bowel sounds are normal. There is no distension.     Palpations: Abdomen is soft.     Tenderness: There is no abdominal tenderness.  Genitourinary:    Comments: Scrotum is swollen Musculoskeletal:     Cervical back: Neck supple.     Right lower leg: Edema present.     Left lower leg: Edema present.     Comments: 2-3+ bilateral lower extremity edema starting at thighs   Lymphadenopathy:     Cervical: No cervical adenopathy.  Skin:    General: Skin is warm and dry.  Neurological:     Mental Status: He is alert. Mental status is at baseline.  Psychiatric:        Mood and Affect: Mood normal.      ASSESSMENT/ PLAN:  TODAY  1. HCAP (healthcare associated pneumonia) 2. Right pleural effusion 3. New onset afib   Will change to levaquin 750 IV  Will begin daily weights to monitor his fluid status Will stop lasix Will begin demedex 40 mg daily will give first  dose now Will begin elquis 5 mg twice daily for new onset afib Will get d-dimer Will monitor his status.  More than likely this does represent a respiratory process rather than cardiac.  I have discussed this plan with Dr. Linna Darner   MD is aware of resident's narcotic use and is in agreement with current plan of care. We will attempt to wean resident as appropriate.  Ok Edwards NP [facility]  Contact [phone] Monday through Friday 8am- 5pm  After hours call [phone]

## 2020-07-22 NOTE — Assessment & Plan Note (Signed)
07/22/2020 patient denies ever having had Covid infection.

## 2020-07-22 NOTE — Patient Instructions (Signed)
See assessment and plan under each diagnosis in the problem list and acutely for this visit 

## 2020-07-23 ENCOUNTER — Other Ambulatory Visit (HOSPITAL_COMMUNITY)
Admission: RE | Admit: 2020-07-23 | Discharge: 2020-07-23 | Disposition: A | Discharge status: Home / Self Care | Payer: Medicare Other | Source: Skilled Nursing Facility | Attending: Adult Health | Admitting: Adult Health

## 2020-07-23 DIAGNOSIS — E119 Type 2 diabetes mellitus without complications: Secondary | ICD-10-CM | POA: Diagnosis present

## 2020-07-23 LAB — COMPREHENSIVE METABOLIC PANEL
ALT: 21 U/L (ref 0–44)
AST: 20 U/L (ref 15–41)
Albumin: 3.2 g/dL — ABNORMAL LOW (ref 3.5–5.0)
Alkaline Phosphatase: 81 U/L (ref 38–126)
Anion gap: 8 (ref 5–15)
BUN: 20 mg/dL (ref 8–23)
CO2: 32 mmol/L (ref 22–32)
Calcium: 8.7 mg/dL — ABNORMAL LOW (ref 8.9–10.3)
Chloride: 93 mmol/L — ABNORMAL LOW (ref 98–111)
Creatinine, Ser: 0.62 mg/dL (ref 0.61–1.24)
GFR calc Af Amer: >60 mL/min (ref >60)
GFR calc non Af Amer: >60 mL/min (ref >60)
Glucose, Bld: 110 mg/dL — ABNORMAL HIGH (ref 70–99)
Potassium: 3.4 mmol/L — ABNORMAL LOW (ref 3.5–5.1)
Sodium: 133 mmol/L — ABNORMAL LOW (ref 135–145)
Total Bilirubin: 0.7 mg/dL (ref 0.3–1.2)
Total Protein: 5.4 g/dL — ABNORMAL LOW (ref 6.5–8.1)

## 2020-07-27 ENCOUNTER — Other Ambulatory Visit: Payer: Self-pay

## 2020-07-27 ENCOUNTER — Encounter (HOSPITAL_COMMUNITY)
Admission: RE | Admit: 2020-07-27 | Discharge: 2020-07-27 | Disposition: A | Discharge status: Home / Self Care | Payer: Medicare Other | Source: Skilled Nursing Facility | Attending: Adult Health | Admitting: Adult Health

## 2020-07-27 ENCOUNTER — Inpatient Hospital Stay (HOSPITAL_COMMUNITY)
Admission: EM | Admit: 2020-07-27 | Discharge: 2020-08-01 | DRG: 308 | Disposition: A | Discharge status: Skilled Nursing Facility | Payer: Medicare Other | Source: Skilled Nursing Facility | Attending: Family Medicine | Admitting: Family Medicine

## 2020-07-27 ENCOUNTER — Inpatient Hospital Stay (HOSPITAL_COMMUNITY): Payer: Medicare Other

## 2020-07-27 ENCOUNTER — Encounter (HOSPITAL_COMMUNITY): Payer: Self-pay | Admitting: Emergency Medicine

## 2020-07-27 ENCOUNTER — Emergency Department (HOSPITAL_COMMUNITY): Payer: Medicare Other

## 2020-07-27 DIAGNOSIS — I4891 Unspecified atrial fibrillation: Secondary | ICD-10-CM

## 2020-07-27 DIAGNOSIS — I4819 Other persistent atrial fibrillation: Secondary | ICD-10-CM | POA: Diagnosis present

## 2020-07-27 DIAGNOSIS — I959 Hypotension, unspecified: Secondary | ICD-10-CM | POA: Diagnosis present

## 2020-07-27 DIAGNOSIS — D860 Sarcoidosis of lung: Secondary | ICD-10-CM | POA: Diagnosis present

## 2020-07-27 DIAGNOSIS — Z9119 Patient's noncompliance with other medical treatment and regimen: Secondary | ICD-10-CM

## 2020-07-27 DIAGNOSIS — Z87891 Personal history of nicotine dependence: Secondary | ICD-10-CM

## 2020-07-27 DIAGNOSIS — D869 Sarcoidosis, unspecified: Secondary | ICD-10-CM | POA: Diagnosis present

## 2020-07-27 DIAGNOSIS — Z8616 Personal history of COVID-19: Secondary | ICD-10-CM

## 2020-07-27 DIAGNOSIS — Z993 Dependence on wheelchair: Secondary | ICD-10-CM

## 2020-07-27 DIAGNOSIS — Z880 Allergy status to penicillin: Secondary | ICD-10-CM

## 2020-07-27 DIAGNOSIS — F429 Obsessive-compulsive disorder, unspecified: Secondary | ICD-10-CM | POA: Diagnosis present

## 2020-07-27 DIAGNOSIS — Z20822 Contact with and (suspected) exposure to covid-19: Secondary | ICD-10-CM | POA: Diagnosis present

## 2020-07-27 DIAGNOSIS — G8929 Other chronic pain: Secondary | ICD-10-CM | POA: Diagnosis present

## 2020-07-27 DIAGNOSIS — E11649 Type 2 diabetes mellitus with hypoglycemia without coma: Secondary | ICD-10-CM | POA: Diagnosis present

## 2020-07-27 DIAGNOSIS — G959 Disease of spinal cord, unspecified: Secondary | ICD-10-CM | POA: Diagnosis present

## 2020-07-27 DIAGNOSIS — K219 Gastro-esophageal reflux disease without esophagitis: Secondary | ICD-10-CM | POA: Diagnosis present

## 2020-07-27 DIAGNOSIS — J90 Pleural effusion, not elsewhere classified: Secondary | ICD-10-CM | POA: Diagnosis present

## 2020-07-27 DIAGNOSIS — Z85820 Personal history of malignant melanoma of skin: Secondary | ICD-10-CM

## 2020-07-27 DIAGNOSIS — Z961 Presence of intraocular lens: Secondary | ICD-10-CM | POA: Diagnosis present

## 2020-07-27 DIAGNOSIS — I2510 Atherosclerotic heart disease of native coronary artery without angina pectoris: Secondary | ICD-10-CM | POA: Diagnosis present

## 2020-07-27 DIAGNOSIS — Z882 Allergy status to sulfonamides status: Secondary | ICD-10-CM

## 2020-07-27 DIAGNOSIS — M109 Gout, unspecified: Secondary | ICD-10-CM | POA: Diagnosis present

## 2020-07-27 DIAGNOSIS — Z66 Do not resuscitate: Secondary | ICD-10-CM | POA: Diagnosis present

## 2020-07-27 DIAGNOSIS — I119 Hypertensive heart disease without heart failure: Secondary | ICD-10-CM | POA: Diagnosis present

## 2020-07-27 DIAGNOSIS — E876 Hypokalemia: Secondary | ICD-10-CM | POA: Diagnosis present

## 2020-07-27 DIAGNOSIS — F0150 Vascular dementia without behavioral disturbance: Secondary | ICD-10-CM | POA: Diagnosis present

## 2020-07-27 DIAGNOSIS — E782 Mixed hyperlipidemia: Secondary | ICD-10-CM | POA: Diagnosis present

## 2020-07-27 DIAGNOSIS — Z888 Allergy status to other drugs, medicaments and biological substances status: Secondary | ICD-10-CM

## 2020-07-27 DIAGNOSIS — Z9049 Acquired absence of other specified parts of digestive tract: Secondary | ICD-10-CM

## 2020-07-27 DIAGNOSIS — I25119 Atherosclerotic heart disease of native coronary artery with unspecified angina pectoris: Secondary | ICD-10-CM | POA: Diagnosis not present

## 2020-07-27 DIAGNOSIS — J189 Pneumonia, unspecified organism: Secondary | ICD-10-CM | POA: Diagnosis present

## 2020-07-27 DIAGNOSIS — Z7401 Bed confinement status: Secondary | ICD-10-CM

## 2020-07-27 DIAGNOSIS — Z887 Allergy status to serum and vaccine status: Secondary | ICD-10-CM

## 2020-07-27 DIAGNOSIS — R0902 Hypoxemia: Secondary | ICD-10-CM | POA: Diagnosis present

## 2020-07-27 DIAGNOSIS — Z9841 Cataract extraction status, right eye: Secondary | ICD-10-CM

## 2020-07-27 DIAGNOSIS — Z955 Presence of coronary angioplasty implant and graft: Secondary | ICD-10-CM

## 2020-07-27 DIAGNOSIS — Z9889 Other specified postprocedural states: Secondary | ICD-10-CM | POA: Diagnosis not present

## 2020-07-27 DIAGNOSIS — Z9842 Cataract extraction status, left eye: Secondary | ICD-10-CM

## 2020-07-27 DIAGNOSIS — G4733 Obstructive sleep apnea (adult) (pediatric): Secondary | ICD-10-CM | POA: Diagnosis present

## 2020-07-27 DIAGNOSIS — I10 Essential (primary) hypertension: Secondary | ICD-10-CM | POA: Diagnosis not present

## 2020-07-27 DIAGNOSIS — J918 Pleural effusion in other conditions classified elsewhere: Secondary | ICD-10-CM | POA: Diagnosis present

## 2020-07-27 DIAGNOSIS — Z9109 Other allergy status, other than to drugs and biological substances: Secondary | ICD-10-CM

## 2020-07-27 DIAGNOSIS — Z79899 Other long term (current) drug therapy: Secondary | ICD-10-CM

## 2020-07-27 DIAGNOSIS — Z7901 Long term (current) use of anticoagulants: Secondary | ICD-10-CM

## 2020-07-27 LAB — COMPREHENSIVE METABOLIC PANEL
ALT: 28 U/L (ref 0–44)
AST: 31 U/L (ref 15–41)
Albumin: 3.4 g/dL — ABNORMAL LOW (ref 3.5–5.0)
Alkaline Phosphatase: 83 U/L (ref 38–126)
Anion gap: 11 (ref 5–15)
BUN: 27 mg/dL — ABNORMAL HIGH (ref 8–23)
CO2: 32 mmol/L (ref 22–32)
Calcium: 8.6 mg/dL — ABNORMAL LOW (ref 8.9–10.3)
Chloride: 94 mmol/L — ABNORMAL LOW (ref 98–111)
Creatinine, Ser: 0.86 mg/dL (ref 0.61–1.24)
GFR calc Af Amer: >60 mL/min (ref >60)
GFR calc non Af Amer: >60 mL/min (ref >60)
Glucose, Bld: 110 mg/dL — ABNORMAL HIGH (ref 70–99)
Potassium: 3.6 mmol/L (ref 3.5–5.1)
Sodium: 137 mmol/L (ref 135–145)
Total Bilirubin: 1 mg/dL (ref 0.3–1.2)
Total Protein: 5.8 g/dL — ABNORMAL LOW (ref 6.5–8.1)

## 2020-07-27 LAB — APTT: aPTT: 35 seconds (ref 24–36)

## 2020-07-27 LAB — URINALYSIS, ROUTINE W REFLEX MICROSCOPIC
Bilirubin Urine: NEGATIVE
Glucose, UA: NEGATIVE mg/dL
Ketones, ur: NEGATIVE mg/dL
Leukocytes,Ua: NEGATIVE
Nitrite: NEGATIVE
Protein, ur: NEGATIVE mg/dL
Specific Gravity, Urine: 1.012 (ref 1.005–1.030)
pH: 7 (ref 5.0–8.0)

## 2020-07-27 LAB — CBC WITH DIFFERENTIAL/PLATELET
Abs Immature Granulocytes: 0.01 10*3/uL (ref 0.00–0.07)
Basophils Absolute: 0.1 10*3/uL (ref 0.0–0.1)
Basophils Relative: 1 %
Eosinophils Absolute: 0.4 10*3/uL (ref 0.0–0.5)
Eosinophils Relative: 5 %
HCT: 41.4 % (ref 39.0–52.0)
Hemoglobin: 13.6 g/dL (ref 13.0–17.0)
Immature Granulocytes: 0 %
Lymphocytes Relative: 8 %
Lymphs Abs: 0.7 10*3/uL (ref 0.7–4.0)
MCH: 29.8 pg (ref 26.0–34.0)
MCHC: 32.9 g/dL (ref 30.0–36.0)
MCV: 90.6 fL (ref 80.0–100.0)
Monocytes Absolute: 0.6 10*3/uL (ref 0.1–1.0)
Monocytes Relative: 7 %
Neutro Abs: 6.6 10*3/uL (ref 1.7–7.7)
Neutrophils Relative %: 79 %
Platelets: 133 10*3/uL — ABNORMAL LOW (ref 150–400)
RBC: 4.57 MIL/uL (ref 4.22–5.81)
RDW: 15.8 % — ABNORMAL HIGH (ref 11.5–15.5)
WBC: 8.4 10*3/uL (ref 4.0–10.5)
nRBC: 0 % (ref 0.0–0.2)

## 2020-07-27 LAB — CBG MONITORING, ED
Glucose-Capillary: 116 mg/dL — ABNORMAL HIGH (ref 70–99)
Glucose-Capillary: 134 mg/dL — ABNORMAL HIGH (ref 70–99)

## 2020-07-27 LAB — PROTIME-INR
INR: 1.9 — ABNORMAL HIGH (ref 0.8–1.2)
Prothrombin Time: 20.7 seconds — ABNORMAL HIGH (ref 11.4–15.2)

## 2020-07-27 LAB — RESPIRATORY PANEL BY RT PCR (FLU A&B, COVID)
Influenza A by PCR: NEGATIVE
Influenza B by PCR: NEGATIVE
SARS Coronavirus 2 by RT PCR: NEGATIVE

## 2020-07-27 LAB — PROCALCITONIN: Procalcitonin: <0.1 ng/mL

## 2020-07-27 LAB — BASIC METABOLIC PANEL
Anion gap: 10 (ref 5–15)
BUN: 29 mg/dL — ABNORMAL HIGH (ref 8–23)
CO2: 33 mmol/L — ABNORMAL HIGH (ref 22–32)
Calcium: 8.4 mg/dL — ABNORMAL LOW (ref 8.9–10.3)
Chloride: 95 mmol/L — ABNORMAL LOW (ref 98–111)
Creatinine, Ser: 0.88 mg/dL (ref 0.61–1.24)
GFR calc Af Amer: >60 mL/min (ref >60)
GFR calc non Af Amer: >60 mL/min (ref >60)
Glucose, Bld: 104 mg/dL — ABNORMAL HIGH (ref 70–99)
Potassium: 3.5 mmol/L (ref 3.5–5.1)
Sodium: 138 mmol/L (ref 135–145)

## 2020-07-27 LAB — ECHOCARDIOGRAM LIMITED
Height: 69 in
S' Lateral: 2.87 cm
Weight: 3369.62 oz

## 2020-07-27 LAB — LACTIC ACID, PLASMA
Lactic Acid, Venous: 1.5 mmol/L (ref 0.5–1.9)
Lactic Acid, Venous: 1.9 mmol/L (ref 0.5–1.9)

## 2020-07-27 LAB — TSH: TSH: 2.324 u[IU]/mL (ref 0.350–4.500)

## 2020-07-27 MED ORDER — INSULIN ASPART 100 UNIT/ML ~~LOC~~ SOLN: 0.0000 [IU] | Freq: Every day | SUBCUTANEOUS | Status: DC

## 2020-07-27 MED ORDER — ACETAMINOPHEN 500 MG PO TABS: 500.0000 mg | ORAL_TABLET | Freq: Four times a day (QID) | ORAL | Status: DC | PRN

## 2020-07-27 MED ORDER — ONDANSETRON HCL 4 MG/2ML IJ SOLN: 4.0000 mg | Freq: Four times a day (QID) | INTRAMUSCULAR | Status: DC | PRN

## 2020-07-27 MED ORDER — GABAPENTIN 100 MG PO CAPS
100.0000 mg | ORAL_CAPSULE | Freq: Every day | ORAL | Status: DC
  Administered 2020-07-27 – 2020-07-31 (×5): 100 mg via ORAL
  Filled 2020-07-27 (×5): qty 1

## 2020-07-27 MED ORDER — SODIUM CHLORIDE 0.9 % IV SOLN: 2.0000 g | Freq: Three times a day (TID) | INTRAVENOUS | Status: DC

## 2020-07-27 MED ORDER — LACTATED RINGERS IV SOLN: INTRAVENOUS | Status: DC

## 2020-07-27 MED ORDER — VANCOMYCIN HCL IN DEXTROSE 1-5 GM/200ML-% IV SOLN: 1000.0000 mg | Freq: Once | INTRAVENOUS | Status: DC

## 2020-07-27 MED ORDER — SODIUM CHLORIDE 0.9 % IV SOLN: 250.0000 mL | INTRAVENOUS | Status: DC | PRN

## 2020-07-27 MED ORDER — DONEPEZIL HCL 5 MG PO TABS
10.0000 mg | ORAL_TABLET | Freq: Every day | ORAL | Status: DC
  Administered 2020-07-27 – 2020-07-31 (×5): 10 mg via ORAL
  Filled 2020-07-27: qty 2
  Filled 2020-07-27: qty 1
  Filled 2020-07-27 (×3): qty 2
  Filled 2020-07-27: qty 1

## 2020-07-27 MED ORDER — LEVALBUTEROL HCL 0.63 MG/3ML IN NEBU: 0.6300 mg | INHALATION_SOLUTION | Freq: Four times a day (QID) | RESPIRATORY_TRACT | Status: DC | PRN

## 2020-07-27 MED ORDER — DILTIAZEM HCL-DEXTROSE 125-5 MG/125ML-% IV SOLN (PREMIX)
5.0000 mg/h | INTRAVENOUS | Status: DC
  Administered 2020-07-27 – 2020-07-28 (×2): 5 mg/h via INTRAVENOUS
  Filled 2020-07-27 (×2): qty 125

## 2020-07-27 MED ORDER — ALLOPURINOL 300 MG PO TABS
300.0000 mg | ORAL_TABLET | Freq: Every day | ORAL | Status: DC
  Administered 2020-07-27 – 2020-08-01 (×6): 300 mg via ORAL
  Filled 2020-07-27 (×8): qty 1

## 2020-07-27 MED ORDER — VANCOMYCIN HCL 2000 MG/400ML IV SOLN
2000.0000 mg | Freq: Once | INTRAVENOUS | Status: AC
  Administered 2020-07-27: 2000 mg via INTRAVENOUS
  Filled 2020-07-27: qty 400

## 2020-07-27 MED ORDER — ONDANSETRON HCL 4 MG PO TABS: 4.0000 mg | ORAL_TABLET | Freq: Four times a day (QID) | ORAL | Status: DC | PRN

## 2020-07-27 MED ORDER — ATORVASTATIN CALCIUM 20 MG PO TABS
20.0000 mg | ORAL_TABLET | Freq: Every evening | ORAL | Status: DC
  Administered 2020-07-27 – 2020-08-01 (×6): 20 mg via ORAL
  Filled 2020-07-27 (×5): qty 1
  Filled 2020-07-27: qty 2

## 2020-07-27 MED ORDER — INSULIN ASPART 100 UNIT/ML ~~LOC~~ SOLN
0.0000 [IU] | Freq: Three times a day (TID) | SUBCUTANEOUS | Status: DC
  Administered 2020-07-29: 5 [IU] via SUBCUTANEOUS
  Administered 2020-07-30: 1 [IU] via SUBCUTANEOUS
  Administered 2020-07-30: 2 [IU] via SUBCUTANEOUS

## 2020-07-27 MED ORDER — SODIUM CHLORIDE 0.9 % IV SOLN
2.0000 g | Freq: Once | INTRAVENOUS | Status: AC
  Administered 2020-07-27: 2 g via INTRAVENOUS
  Filled 2020-07-27: qty 2

## 2020-07-27 MED ORDER — FUROSEMIDE 10 MG/ML IJ SOLN
40.0000 mg | Freq: Two times a day (BID) | INTRAMUSCULAR | Status: DC
  Administered 2020-07-27 – 2020-07-28 (×2): 40 mg via INTRAVENOUS
  Filled 2020-07-27 (×2): qty 4

## 2020-07-27 MED ORDER — ASPIRIN EC 81 MG PO TBEC
81.0000 mg | DELAYED_RELEASE_TABLET | Freq: Every day | ORAL | Status: DC
  Administered 2020-07-27 – 2020-07-29 (×3): 81 mg via ORAL
  Filled 2020-07-27 (×3): qty 1

## 2020-07-27 MED ORDER — APIXABAN 5 MG PO TABS
5.0000 mg | ORAL_TABLET | Freq: Two times a day (BID) | ORAL | Status: DC
  Administered 2020-07-27 – 2020-08-01 (×11): 5 mg via ORAL
  Filled 2020-07-27 (×11): qty 1

## 2020-07-27 MED ORDER — PANTOPRAZOLE SODIUM 40 MG PO TBEC
40.0000 mg | DELAYED_RELEASE_TABLET | Freq: Every day | ORAL | Status: DC
  Administered 2020-07-27 – 2020-08-01 (×6): 40 mg via ORAL
  Filled 2020-07-27 (×6): qty 1

## 2020-07-27 MED ORDER — LACTATED RINGERS IV BOLUS
500.0000 mL | Freq: Once | INTRAVENOUS | Status: AC
  Administered 2020-07-27: 500 mL via INTRAVENOUS

## 2020-07-27 MED ORDER — DILTIAZEM LOAD VIA INFUSION
10.0000 mg | Freq: Once | INTRAVENOUS | Status: AC
  Administered 2020-07-27: 10 mg via INTRAVENOUS
  Filled 2020-07-27: qty 10

## 2020-07-27 MED ORDER — VANCOMYCIN HCL 1500 MG/300ML IV SOLN: 1500.0000 mg | INTRAVENOUS | Status: DC

## 2020-07-27 MED ORDER — SODIUM CHLORIDE 0.9% FLUSH
3.0000 mL | INTRAVENOUS | Status: DC | PRN
  Administered 2020-07-29: 3 mL via INTRAVENOUS

## 2020-07-27 MED ORDER — SODIUM CHLORIDE 0.9% FLUSH
3.0000 mL | Freq: Two times a day (BID) | INTRAVENOUS | Status: DC
  Administered 2020-07-27 – 2020-08-01 (×8): 3 mL via INTRAVENOUS

## 2020-07-27 NOTE — Progress Notes (Signed)
Patient set up on auto BiPAP  9/5 medium mask.  No idea what his home CPAP is setting on, No sleep study, No mention of setting only CPAP use. Patient is hard to understand but best can be determined he is not wearing CPAP at [facility] do parts being missed?? on machine. Also with his mental state I doubt he wears if it works very long.

## 2020-07-27 NOTE — ED Notes (Signed)
Dr Manuella Ghazi at bedside and aware of pt BP 65/36 and 77/64, Cardizem at 5 mg/hr. Orders received for LR bolus of 500 ml.

## 2020-07-27 NOTE — Progress Notes (Signed)
*  PRELIMINARY RESULTS* Echocardiogram Limited 2-D Echocardiogram has been performed.  Eric Bowman 07/27/2020, 4:31 PM

## 2020-07-27 NOTE — H&P (Signed)
History and Physical    Eric Bowman FIE:[id number] DOB: 21-Jun-1933 DOA: 07/27/2020  PCP: Gerlene Fee, NP   Patient coming from: [facility]  Chief Complaint: Elevated heart rate  HPI: Eric Bowman is a 86 y.o. male with medical history significant for dementia, cervical myelopathy with inability to ambulate, CAD, OSA, gout, hyperlipidemia, hypertension, and GERD who was brought from [facility] with some hypoxemia as well as tachycardia and weakness.  His heart rate was approximately 140 bpm and he was noted to be in atrial fibrillation which was new for him.  He was also noted to be tachypneic and has not a known moderate-sized pleural effusion from ultrasound on 9/20, but thoracentesis could not be performed at that time since he could not sit upright.  He was suspected to have pneumonia a few days prior on 9/22 for which he was started on Levaquin intravenously for treatment.   ED Course: Vital signs with ongoing tachypnea and elevated heart rates, as well as some soft blood pressure readings since Cardizem initiated.  Systolic blood pressure approximately in the 16S and diastolic in the 06T.  Plan to try fluid bolus at this time.  Platelet count of 133,000, INR 1.9, lactic acid 1.5.  Chest x-[name] with cardiomegaly and pulmonary vascular congestion as well as moderate right-sided pleural effusion noted.  He was given vancomycin and cefepime for empiric pneumonia coverage and started on Cardizem drip.  EKG with heart rate 145 and atrial fibrillation noted.  Patient cannot contribute with any meaningful history at this time.  Review of Systems: Cannot be obtained given patient condition.  Past Medical History:  Diagnosis Date  . Cancer (HCC)    melanoma-left side  . Chronic pain    legs and feet  . Coronary artery disease   . Dementia ([place])   . Diabetes mellitus without complication ([place])   . Foot drop   . GERD (gastroesophageal reflux disease)   . Gout   . Hyperlipidemia    . Hypertension   . OCD (obsessive compulsive disorder)   . PONV (postoperative nausea and vomiting)   . Psychosis ([place])   . Reflux   . Sarcoidosis   . Sleep apnea    cpap    Past Surgical History:  Procedure Laterality Date  . BACK SURGERY     cervical neck  . CATARACT EXTRACTION W/PHACO  01/05/2012   Procedure: CATARACT EXTRACTION PHACO AND INTRAOCULAR LENS PLACEMENT (IOC);  Surgeon: [name] Branch, MD;  Location: [facility];  Service: Ophthalmology;  Laterality: Left;  CDE 18.47  . CATARACT EXTRACTION W/PHACO  02/02/2012   Procedure: CATARACT EXTRACTION PHACO AND INTRAOCULAR LENS PLACEMENT (IOC);  Surgeon: [name] Branch, MD;  Location: [facility];  Service: Ophthalmology;  Laterality: Right;  CDE:15.38  . CHOLECYSTECTOMY    . GALLBLADDER SURGERY    . hemorhoidectomy    . MELANOMA EXCISION     left side-[name]  . TONSILLECTOMY       reports that he quit smoking about 46 years ago. His smoking use included pipe. He has a 5.00 pack-year smoking history. He has never used smokeless tobacco. He reports that he does not drink alcohol and does not use drugs.  Allergies  Allergen Reactions  . Other   . Penicillin G   . Sulfonic Acid (3,5-Dibromo-4-H-Ox-Benz)   . Penicillins Rash  . Sulfonamide Derivatives Rash  . Tetanus Toxoid Rash    Family History  Problem Relation Age of Onset  . Heart disease Other   .  Arthritis Other   . Cancer Other   . Anesthesia problems Neg Hx   . Hypotension Neg Hx   . Malignant hyperthermia Neg Hx   . Pseudochol deficiency Neg Hx     Prior to Admission medications   Medication Sig Start Date End Date Taking? Authorizing Provider  acetaminophen (TYLENOL) 500 MG tablet Take 500 mg by mouth every 6 (six) hours as needed. For pain     [provider]  allopurinol (ZYLOPRIM) 300 MG tablet Take 300 mg by mouth daily.      [provider]  apixaban (ELIQUIS) 5 MG TABS tablet Take 5 mg by mouth 2 (two) times daily. 07/21/20   [provider]  aspirin EC 81 MG tablet Take 81 mg by mouth daily.    [provider]  atorvastatin (LIPITOR) 20 MG tablet Take 20 mg by mouth every evening. 06/21/20   [provider]  Eric Bowman) OINT Special Instructions: Apply to sacrum/coccyx and bilateral buttocks qshift prn erythema. 09/27/19   [provider]  cetirizine (ZYRTEC ALLERGY) 10 MG tablet Take 10 mg by mouth at bedtime. Special Instructions: chronic allergic rhinitis 06/25/20   [provider]  donepezil (ARICEPT) 10 MG tablet Take 10 mg by mouth at bedtime.  02/19/14   [provider]  gabapentin (NEURONTIN) 100 MG capsule Take 100 mg by mouth at bedtime.    [provider]  ipratropium-albuterol (DUONEB) 0.5-2.5 (3) MG/3ML SOLN Take 3 mLs by nebulization every 6 (six) hours as needed. 07/15/20   [provider]  levofloxacin (LEVAQUIN) 25 MG/ML solution Take 750 mg by mouth daily. 07/21/20 08/02/20  [provider]  lisinopril (ZESTRIL) 5 MG tablet Take 1 tablet (5 mg total) by mouth daily. 06/22/19   Sinda Du, MD  meloxicam (MOBIC) 15 MG tablet Take 15 mg by mouth daily.     [provider]  NON FORMULARY Diet: _____ Regular, __X____ NAS, _______Consistent Carbohydrate, _______NPO _____Other   Diet - Liquids: _X__Regular; ___Thickened ___ Consistency: ___ Nectar, ___Honey, ___ Pudding; ____ Fluid Restriction 06/21/20   [provider]  NON FORMULARY C-PAP from home, use home settings while sleeping At Bedtime 08:00 PM  clean CPAP mask and tubing w/baby shampoorinse well w/warm water weekly and PRN Once A Day on Tue 03:15 PM - 11:15 PM 01/02/20   [provider]  pantoprazole (PROTONIX) 40 MG tablet Take 1 tablet (40 mg total) by mouth daily. 06/22/19   Sinda Du, MD  torsemide (DEMADEX) 20 MG tablet Take 40 mg by mouth daily. 07/22/20   [provider]  Vitamins A & D (VITAMIN A & D)  ointment 3 (three) times daily as needed for dry skin. Apply A&D ointment to left heel qshift & prn for erythema. 06/30/20   [provider]    Physical Exam: Vitals:   07/27/20 1145 07/27/20 1200 07/27/20 1215 07/27/20 1230  BP:  (!) 127/92  107/60  Pulse:  (!) 43 (!) 46 87  Resp: (!) 41 (!) 26 (!) 29 (!) 30  Temp:      TempSrc:      SpO2:  93% 96% 97%  Weight:      Height:        Constitutional: NAD Vitals:   07/27/20 1145 07/27/20 1200 07/27/20 1215 07/27/20 1230  BP:  (!) 127/92  107/60  Pulse:  (!) 43 (!) 46 87  Resp: (!) 41 (!) 26 (!) 29 (!) 30  Temp:  TempSrc:      SpO2:  93% 96% 97%  Weight:      Height:       Eyes: lids and conjunctivae normal ENMT: Mucous membranes are moist.  Neck: normal, supple Respiratory: Diminished to auscultation bilaterally. Normal respiratory effort. No accessory muscle use.  Cardiovascular: Irregular and tachycardic, no murmurs. No extremity edema. Abdomen: Minimal tenderness to palpation throughout, no distention. Bowel sounds positive.  Musculoskeletal:  No joint deformity upper and lower extremities.   Skin: no rashes, lesions, ulcers.  Psychiatric: Difficult to assess  Labs on Admission: I have personally reviewed following labs and imaging studies  CBC: Recent Labs  Lab 07/21/20 0630 07/27/20 1145  WBC 5.7 8.4  NEUTROABS 4.2 6.6  HGB 13.5 13.6  HCT 42.0 41.4  MCV 91.1 90.6  PLT 171 481*   Basic Metabolic Panel: Recent Labs  Lab 07/21/20 0630 07/23/20 0420 07/27/20 0804 07/27/20 1145  NA 132* 133* 138 137  K 4.1 3.4* 3.5 3.6  CL 95* 93* 95* 94*  CO2 27 32 33* 32  GLUCOSE 106* 110* 104* 110*  BUN 21 20 29* 27*  CREATININE 0.58* 0.62 0.88 0.86  CALCIUM 9.0 8.7* 8.4* 8.6*   GFR: Estimated Creatinine Clearance: 72.9 mL/min (by C-G formula based on SCr of 0.86 mg/dL). Liver Function Tests: Recent Labs  Lab 07/23/20 0420 07/27/20 1145  AST 20 31  ALT 21 28  ALKPHOS 81 83  BILITOT 0.7 1.0   PROT 5.4* 5.8*  ALBUMIN 3.2* 3.4*   No results for input(s): LIPASE, AMYLASE in the last 168 hours. No results for input(s): AMMONIA in the last 168 hours. Coagulation Profile: Recent Labs  Lab 07/27/20 1145  INR 1.9*   Cardiac Enzymes: No results for input(s): CKTOTAL, CKMB, CKMBINDEX, TROPONINI in the last 168 hours. BNP (last 3 results) No results for input(s): PROBNP in the last 8760 hours. HbA1C: No results for input(s): HGBA1C in the last 72 hours. CBG: No results for input(s): GLUCAP in the last 168 hours. Lipid Profile: No results for input(s): CHOL, HDL, LDLCALC, TRIG, CHOLHDL, LDLDIRECT in the last 72 hours. Thyroid Function Tests: No results for input(s): TSH, T4TOTAL, FREET4, T3FREE, THYROIDAB in the last 72 hours. Anemia Panel: No results for input(s): VITAMINB12, FOLATE, FERRITIN, TIBC, IRON, RETICCTPCT in the last 72 hours. Urine analysis:    Component Value Date/Time   COLORURINE STRAW (A) 06/19/2019 1500   APPEARANCEUR CLEAR 06/19/2019 1500   LABSPEC 1.009 06/19/2019 1500   PHURINE 5.0 06/19/2019 1500   GLUCOSEU NEGATIVE 06/19/2019 1500   HGBUR NEGATIVE 06/19/2019 1500   BILIRUBINUR NEGATIVE 06/19/2019 1500   KETONESUR NEGATIVE 06/19/2019 1500   PROTEINUR NEGATIVE 06/19/2019 1500   NITRITE NEGATIVE 06/19/2019 1500   LEUKOCYTESUR NEGATIVE 06/19/2019 1500    Radiological Exams on Admission: DG Chest Port 1 View  Result Date: 07/27/2020 CLINICAL DATA:  Questionable sepsis.  Shortness of breath. EXAM: PORTABLE CHEST 1 VIEW COMPARISON:  07/20/2020.  06/19/2019. FINDINGS: Cardiomegaly. Pulmonary venous congestion cannot be excluded. Bilateral interstitial prominence suggesting interstitial edema and or pneumonitis. Right-sided prominent pleural effusion again noted, decreased in size from prior chest x-[place] of 07/20/2020. Carotid vascular calcification. Prior cervical spine fusion. IMPRESSION: 1. Cardiomegaly. Pulmonary venous congestion. Bilateral interstitial  prominence suggesting interstitial edema and or pneumonitis. Right-sided prominent pleural effusion again noted, decreased in size from prior chest x-[place]. 2.  Carotid vascular disease. Electronically Signed   By: Marcello Moores  Register   On: 07/27/2020 11:19    EKG: Independently reviewed. Afib  with RVR 145bpm.  Assessment/Plan Active Problems:   Atrial fibrillation with RVR (HCC)   New onset atrial fibrillation with RVR -2D echocardiogram 03/2020 with LVEF 55-60% -Plan to order limited echocardiogram for reevaluation as well as TSH -Maintain on Cardizem drip for rate control -Continue anticoagulation with Eliquis (unknown as to why patient is on this medication) -Appreciate cardiology evaluation and assistance with rate control  Right-sided pleural effusion -Plan for thoracentesis and fluid analysis per radiology once further stabilized -No sign of pneumonia currently noted, check procalcitonin and hold antibiotics for now  History of hypertension currently with hypotension -Hold lisinopril for now with softer blood pressure readings -Holding home torsemide -Trial of fluid bolus, may need to discontinue Cardizem and try digoxin  History of CAD with prior stent -Continue aspirin and statin -No chest pain or signs of ACS noted  History of COPD/pulmonary sarcoid -Breathing treatments with Xopenex as needed with no acute bronchospasms currently noted  History of diabetes -No significant hyperglycemia noted, will try SSI for coverage and carb modified diet -Check hemoglobin A1c  History of dementia -Continue Aricept  History of gout -Continue allopurinol -No acute issues currently  Dyslipidemia -Continue statin  OSA with noncompliance on CPAP -Order CPAP at bedtime  GERD -PPI  Cervical myelopathy -Wheelchair-bound   DVT prophylaxis: Eliquis Code Status: DNR-Gold sheet at bedside Family Communication: Tried calling spouse multiple times on several numbers with no  response 9/27 Disposition Plan:Admit for thoracentesis and afib with RVR eval/tx Consults called:Cardiology Admission status: Inpatient, SDU   Riko Lumsden D Kabrea Seeney DO [facility]  If 7PM-7AM, please contact night-coverage [email or web address]  07/27/2020, 12:45 PM

## 2020-07-27 NOTE — ED Notes (Addendum)
Eric Bowman reports increased SOB, cough, confusion and had chest xray showing right sided pleural effusion on 07/20/20. PT has been on IV and oral antibiotics at the [facility] prior to ED arrival. PT started on 3L n/[facility] before transport to ED today for hypoxia and increased work of breathing. Dependent edema and red rash noted to Right flank. EDP at bedside and evaluating at this time.

## 2020-07-27 NOTE — Progress Notes (Signed)
Pharmacy Antibiotic Note  Eric Bowman is a 86 y.o. male admitted on 07/27/2020 with pneumonia.  Pharmacy has been consulted for Vancomycin and Cefepime dosing.  Plan: Vancomycin 2000 mg IV x 1 dose. Vancomycin 1500 mg IV every 24 hours. Expected AUC 436 Cefepime 2000 mg IV every 8 hours. Monitor labs, c/s, and vanco level as indicated.  Height: 5\' 9"  (175.3 cm) Weight: 95.5 kg (210 lb 9.6 oz) IBW/kg (Calculated) : 70.7  Temp (24hrs), Avg:99.2 F (37.3 C), Min:99.2 F (37.3 C), Max:99.2 F (37.3 C)  Recent Labs  Lab 07/21/20 0630 07/23/20 0420 07/27/20 0804 07/27/20 1145  WBC 5.7  --   --  8.4  CREATININE 0.58* 0.62 0.88 0.86  LATICACIDVEN  --   --   --  1.5    Estimated Creatinine Clearance: 72.9 mL/min (by C-G formula based on SCr of 0.86 mg/dL).    Allergies  Allergen Reactions  . Other   . Penicillin G   . Sulfonic Acid (3,5-Dibromo-4-H-Ox-Benz)   . Penicillins Rash  . Sulfonamide Derivatives Rash  . Tetanus Toxoid Rash    Antimicrobials this admission: Vanco 9/27 >>  Cefepime 9/27 >>    Microbiology results: 9/27 BCx: pending 9/27 UCx: pending   9/27 MRSA PCR: pending  Thank you for allowing pharmacy to be a part of this patient's care.  Margot Ables, PharmD Clinical Pharmacist 07/27/2020 12:55 PM

## 2020-07-27 NOTE — ED Notes (Signed)
Pt states he cannot tolerate the cpap at this time.

## 2020-07-27 NOTE — ED Notes (Signed)
Pt pulled out both IV sites

## 2020-07-27 NOTE — ED Provider Notes (Signed)
[facility] EMERGENCY DEPARTMENT Provider Note   CSN: [id number] Arrival date & time: 07/27/20  1036     History Chief Complaint  Patient presents with  . Pneumonia    Eric Bowman is a 86 y.o. male.  HPI   86 year old male, history of coronary disease, vascular dementia, diabetes, he is bedbound, known pulmonary sarcoid and COPD.  He has no prior history of atrial fibrillation that I can see in the medical record, he does take Eliquis.  He presents from the [facility] where he is currently a resident.  He was noted to be hypoxic and tachycardic with increased weakness.  His heart rate was found to be in the 140 range and what appeared to be atrial fibrillation, blood pressure was soft at 107/87, temperature was 99.2.  He was tachypneic.  Review of the medical record by myself shows that the patient had a ultrasound of the chest on September 20 which revealed a moderate sized pleural effusion, it was unable to be drained secondary to the patient's inability to sit in that position.  He is on Levaquin for suspected pneumonia he is getting intravenously which started on 22 September  Past Medical History:  Diagnosis Date  . Cancer (HCC)    melanoma-left side  . Chronic pain    legs and feet  . Coronary artery disease   . Dementia ([place])   . Diabetes mellitus without complication ([place])   . Foot drop   . GERD (gastroesophageal reflux disease)   . Gout   . Hyperlipidemia   . Hypertension   . OCD (obsessive compulsive disorder)   . PONV (postoperative nausea and vomiting)   . Psychosis ([place])   . Reflux   . Sarcoidosis   . Sleep apnea    cpap    Patient Active Problem List   Diagnosis Date Noted  . New onset a-fib ([place]) 07/22/2020  . History of sarcoidosis 07/22/2020  . History of melanoma 07/22/2020  . HCAP (healthcare-associated pneumonia) 07/20/2020  . Pleural effusion on right 07/20/2020  . Elevated brain natriuretic peptide (BNP) level 07/20/2020  . Cellulitis of  abdominal wall 07/07/2020  . Chronic non-seasonal allergic rhinitis 06/26/2020  . Weight gain 06/11/2020  . Weight loss 12/17/2019  . Poliomyelitis osteopathy of lower leg, right ([place]) 11/01/2019  . COVID-19 10/15/2019  . Radiculopathy of cervical spine 09/09/2019  . Dyslipidemia 06/24/2019  . Vascular dementia without behavioral disturbance ([place]) 06/24/2019  . Chronic gout without tophus 06/24/2019  . Physical deconditioning 06/24/2019  . Multiple falls 06/20/2019  . Arthritis of knee, left 01/03/2013  . Degenerative disc disease, lumbar 01/03/2013  . Coronary artery disease 09/13/2011  . Obstructive sleep apnea on CPAP 09/10/2007  . Essential hypertension 05/02/2007  . GERD 05/02/2007    Past Surgical History:  Procedure Laterality Date  . BACK SURGERY     cervical neck  . CATARACT EXTRACTION W/PHACO  01/05/2012   Procedure: CATARACT EXTRACTION PHACO AND INTRAOCULAR LENS PLACEMENT (IOC);  Surgeon: [name] Branch, MD;  Location: [facility];  Service: Ophthalmology;  Laterality: Left;  CDE 18.47  . CATARACT EXTRACTION W/PHACO  02/02/2012   Procedure: CATARACT EXTRACTION PHACO AND INTRAOCULAR LENS PLACEMENT (IOC);  Surgeon: [name] Branch, MD;  Location: [facility];  Service: Ophthalmology;  Laterality: Right;  CDE:15.38  . CHOLECYSTECTOMY    . GALLBLADDER SURGERY    . hemorhoidectomy    . MELANOMA EXCISION     left side-[name]  . TONSILLECTOMY  Family History  Problem Relation Age of Onset  . Heart disease Other   . Arthritis Other   . Cancer Other   . Anesthesia problems Neg Hx   . Hypotension Neg Hx   . Malignant hyperthermia Neg Hx   . Pseudochol deficiency Neg Hx     Social History   Tobacco Use  . Smoking status: Former Smoker    Packs/day: 0.25    Years: 20.00    Pack years: 5.00    Types: Pipe    Quit date: 01/02/1974    Years since quitting: 46.5  . Smokeless tobacco: Never Used  Vaping Use  . Vaping Use: Never used  Substance Use Topics  . Alcohol use: No   . Drug use: No    Home Medications Prior to Admission medications   Medication Sig Start Date End Date Taking? Authorizing Provider  acetaminophen (TYLENOL) 500 MG tablet Take 500 mg by mouth every 6 (six) hours as needed. For pain     [provider]  allopurinol (ZYLOPRIM) 300 MG tablet Take 300 mg by mouth daily.      [provider]  apixaban (ELIQUIS) 5 MG TABS tablet Take 5 mg by mouth 2 (two) times daily. 07/21/20   [provider]  aspirin EC 81 MG tablet Take 81 mg by mouth daily.    [provider]  atorvastatin (LIPITOR) 20 MG tablet Take 20 mg by mouth every evening. 06/21/20   [provider]  Janne [employer] [facility]) OINT Special Instructions: Apply to sacrum/coccyx and bilateral buttocks qshift prn erythema. 09/27/19   [provider]  cetirizine (ZYRTEC ALLERGY) 10 MG tablet Take 10 mg by mouth at bedtime. Special Instructions: chronic allergic rhinitis 06/25/20   [provider]  donepezil (ARICEPT) 10 MG tablet Take 10 mg by mouth at bedtime.  02/19/14   [provider]  gabapentin (NEURONTIN) 100 MG capsule Take 100 mg by mouth at bedtime.    [provider]  ipratropium-albuterol (DUONEB) 0.5-2.5 (3) MG/3ML SOLN Take 3 mLs by nebulization every 6 (six) hours as needed. 07/15/20   [provider]  levofloxacin (LEVAQUIN) 25 MG/ML solution Take 750 mg by mouth daily. 07/21/20 08/02/20  [provider]  lisinopril (ZESTRIL) 5 MG tablet Take 1 tablet (5 mg total) by mouth daily. 06/22/19   Sinda Du, MD  meloxicam (MOBIC) 15 MG tablet Take 15 mg by mouth daily.     [provider]  NON FORMULARY Diet: _____ Regular, __X____ NAS, _______Consistent Carbohydrate, _______NPO _____Other   Diet - Liquids: _X__Regular; ___Thickened ___ Consistency: ___ Nectar, ___Honey, ___ Pudding; ____ Fluid Restriction 06/21/20   [provider]  NON FORMULARY C-PAP from  home, use home settings while sleeping At Bedtime 08:00 PM  clean CPAP mask and tubing w/baby shampoorinse well w/warm water weekly and PRN Once A Day on Tue 03:15 PM - 11:15 PM 01/02/20   [provider]  pantoprazole (PROTONIX) 40 MG tablet Take 1 tablet (40 mg total) by mouth daily. 06/22/19   Sinda Du, MD  torsemide (DEMADEX) 20 MG tablet Take 40 mg by mouth daily. 07/22/20   [provider]  Vitamins A & D (VITAMIN A & D) ointment 3 (three) times daily as needed for dry skin. Apply A&D ointment to left heel qshift & prn for erythema. 06/30/20   [provider]    Allergies    Other; Penicillin g; Sulfonic acid (3,5-dibromo-4-h-ox-benz); Penicillins; Sulfonamide derivatives; and Tetanus toxoid  Review of Systems   Review of Systems  Unable to perform ROS: Dementia    Physical Exam Updated Vital Signs BP 107/87 (BP Location: Left Arm)   Pulse (!) 140   Temp 99.2 F (37.3 C) (Oral)   Resp (!) 26   Ht 1.753 m (5\' 9" )   Wt 95.5 kg   SpO2 100%   BMI 31.10 kg/m   Physical Exam Vitals and nursing note reviewed.  Constitutional:      General: He is in acute distress.     Appearance: He is well-developed.  HENT:     Head: Normocephalic and atraumatic.     Mouth/Throat:     Pharynx: No oropharyngeal exudate.  Eyes:     General: No scleral icterus.       Right eye: No discharge.        Left eye: No discharge.     Conjunctiva/sclera: Conjunctivae normal.     Pupils: Pupils are equal, round, and reactive to light.  Neck:     Thyroid: No thyromegaly.     Vascular: No JVD.  Cardiovascular:     Rate and Rhythm: Tachycardia present. Rhythm irregular.     Heart sounds: Normal heart sounds. No murmur heard.  No friction rub. No gallop.      Comments: A. fib with rapid ventricular rate at approximately 150 bpm with variability Pulmonary:     Effort: Tachypnea and respiratory distress present.     Breath sounds: Rhonchi present. No wheezing or  rales.     Comments: The patient has scattered rhonchi, dullness to percussion up the right side of the back, decreased lung sounds on the right, tachypnea Abdominal:     General: Bowel sounds are normal. There is no distension.     Palpations: Abdomen is soft. There is no mass.     Tenderness: There is no abdominal tenderness.  Musculoskeletal:        General: No tenderness. Normal range of motion.     Cervical back: Normal range of motion and neck supple.     Right lower leg: Edema present.     Left lower leg: Edema present.  Lymphadenopathy:     Cervical: No cervical adenopathy.  Skin:    General: Skin is warm and dry.     Findings: No erythema or rash.  Neurological:     Mental Status: He is alert.     Coordination: Coordination normal.     Comments: Able to follow simple commands, too weak to sit up in the bed, no obvious facial droop, confused at baseline  Psychiatric:        Behavior: Behavior normal.     ED Results / Procedures / Treatments   Labs (all labs ordered are listed, but only abnormal results are displayed) Labs Reviewed  COMPREHENSIVE METABOLIC PANEL - Abnormal; Notable for the following components:      Result Value   Chloride 94 (*)    Glucose, Bld 110 (*)    BUN 27 (*)    Calcium 8.6 (*)    Total Protein 5.8 (*)    Albumin 3.4 (*)    All other components within normal limits  CBC WITH DIFFERENTIAL/PLATELET - Abnormal; Notable for the following components:   RDW 15.8 (*)    Platelets 133 (*)    All other components within normal limits  PROTIME-INR - Abnormal; Notable for the following components:   Prothrombin Time 20.7 (*)    INR 1.9 (*)  All other components within normal limits  CULTURE, BLOOD (ROUTINE X 2)  CULTURE, BLOOD (ROUTINE X 2)  URINE CULTURE  RESPIRATORY PANEL BY RT PCR (FLU A&B, COVID)  LACTIC ACID, PLASMA  APTT  LACTIC ACID, PLASMA  URINALYSIS, ROUTINE W REFLEX MICROSCOPIC    EKG EKG Interpretation  Date/Time:  Monday  July 27 2020 10:38:31 EDT Ventricular Rate:  145 PR Interval:    QRS Duration: 105 QT Interval:  312 QTC Calculation: 485 R Axis:   22 Text Interpretation: Atrial fibrillation with rapid V-rate Paired ventricular premature complexes Anterior infarct, old Nonspecific T abnormalities, lateral leads Baseline wander in lead(s) V2 Confirmed by Noemi Chapel [phone]) on 07/27/2020 12:49:21 PM   Radiology DG Chest Port 1 View  Result Date: 07/27/2020 CLINICAL DATA:  Questionable sepsis.  Shortness of breath. EXAM: PORTABLE CHEST 1 VIEW COMPARISON:  07/20/2020.  06/19/2019. FINDINGS: Cardiomegaly. Pulmonary venous congestion cannot be excluded. Bilateral interstitial prominence suggesting interstitial edema and or pneumonitis. Right-sided prominent pleural effusion again noted, decreased in size from prior chest x-[place]arius of 07/20/2020. Carotid vascular calcification. Prior cervical spine fusion. IMPRESSION: 1. Cardiomegaly. Pulmonary venous congestion. Bilateral interstitial prominence suggesting interstitial edema and or pneumonitis. Right-sided prominent pleural effusion again noted, decreased in size from prior chest x-Dia. 2.  Carotid vascular disease. Electronically Signed   By: [place]   On: 07/27/2020 11:19    Procedures .Critical Care Performed by: Noemi Chapel, MD Authorized by: Noemi Chapel, MD   Critical care provider statement:    Critical care time (minutes):  35   Critical care time was exclusive of:  Separately billable procedures and treating other patients and teaching time   Critical care was necessary to treat or prevent imminent or life-threatening deterioration of the following conditions:  Cardiac failure and respiratory failure   Critical care was time spent personally by me on the following activities:  Blood draw for specimens, development of treatment plan with patient or surrogate, discussions with consultants, evaluation of patient's response to treatment,  examination of patient, obtaining history from patient or surrogate, ordering and performing treatments and interventions, ordering and review of laboratory studies, ordering and review of radiographic studies, pulse oximetry, re-evaluation of patient's condition and review of old charts Comments:          (including critical care time)  Medications Ordered in ED Medications - No data to display  ED Course  I have reviewed the triage vital signs and the nursing notes.  Pertinent labs & imaging results that were available during my care of the patient were reviewed by me and considered in my medical decision making (see chart for details).    MDM Rules/Calculators/A&P                          This patient was seen most recently at the cardiology office in May 2021, at that time it was not noted that he was in atrial fibrillation, it did touch on his prior coronary disease no no reports or results were available to confirm prior stenting.  Given his new onset atrial fibrillation he will need to have rate control, he is already on Eliquis, he will likely be admitted to the hospital, it is unclear exactly when this started, repeat x-Daeshon will be obtained to further evaluate the effusion which likely needs to be drained and may be part of the cause of the atrial fibrillation given the stress, hypoxia and tachypnea  Critically ill  I discussed the care with Dr. Manuella Ghazi, the admitting hospitalist who has been kind enough to admit the patient to the hospital.  Currently the patient is on a Cardizem drip  The labs have been rather unremarkable thankfully showing no significant renal dysfunction   Eric Bowman was evaluated in Emergency Department on 07/27/2020 for the symptoms described in the history of present illness. He was evaluated in the context of the global COVID-19 pandemic, which necessitated consideration that the patient might be at risk for infection with the SARS-CoV-2 virus that  causes COVID-19. Institutional protocols and algorithms that pertain to the evaluation of patients at risk for COVID-19 are in a state of rapid change based on information released by regulatory bodies including the CDC and federal and state organizations. These policies and algorithms were followed during the patient's care in the ED.   Final Clinical Impression(s) / ED Diagnoses Final diagnoses:  Atrial fibrillation with rapid ventricular response ([place])  Pleural effusion      Noemi Chapel, MD 07/27/20 1249

## 2020-07-27 NOTE — ED Notes (Signed)
ED Provider at bedside. 

## 2020-07-28 ENCOUNTER — Inpatient Hospital Stay (HOSPITAL_COMMUNITY): Payer: Medicare Other

## 2020-07-28 ENCOUNTER — Encounter (HOSPITAL_COMMUNITY): Payer: Self-pay | Admitting: Internal Medicine

## 2020-07-28 DIAGNOSIS — I10 Essential (primary) hypertension: Secondary | ICD-10-CM

## 2020-07-28 DIAGNOSIS — G4733 Obstructive sleep apnea (adult) (pediatric): Secondary | ICD-10-CM

## 2020-07-28 DIAGNOSIS — I4891 Unspecified atrial fibrillation: Secondary | ICD-10-CM

## 2020-07-28 LAB — CBC
HCT: 40.7 % (ref 39.0–52.0)
Hemoglobin: 13 g/dL (ref 13.0–17.0)
MCH: 29.3 pg (ref 26.0–34.0)
MCHC: 31.9 g/dL (ref 30.0–36.0)
MCV: 91.9 fL (ref 80.0–100.0)
Platelets: 120 10*3/uL — ABNORMAL LOW (ref 150–400)
RBC: 4.43 MIL/uL (ref 4.22–5.81)
RDW: 15.6 % — ABNORMAL HIGH (ref 11.5–15.5)
WBC: 6.9 10*3/uL (ref 4.0–10.5)
nRBC: 0 % (ref 0.0–0.2)

## 2020-07-28 LAB — GLUCOSE, CAPILLARY
Glucose-Capillary: 93 mg/dL (ref 70–99)
Glucose-Capillary: 100 mg/dL — ABNORMAL HIGH (ref 70–99)
Glucose-Capillary: 109 mg/dL — ABNORMAL HIGH (ref 70–99)
Glucose-Capillary: 98 mg/dL (ref 70–99)

## 2020-07-28 LAB — GRAM STAIN

## 2020-07-28 LAB — PROTEIN, PLEURAL OR PERITONEAL FLUID: Total protein, fluid: <3 g/dL

## 2020-07-28 LAB — URINE CULTURE: Culture: NO GROWTH

## 2020-07-28 LAB — GLUCOSE, PLEURAL OR PERITONEAL FLUID: Glucose, Fluid: 115 mg/dL

## 2020-07-28 LAB — BODY FLUID CELL COUNT WITH DIFFERENTIAL
Eos, Fluid: 0 %
Lymphs, Fluid: 78 %
Monocyte-Macrophage-Serous Fluid: 10 % — ABNORMAL LOW (ref 50–90)
Neutrophil Count, Fluid: 12 % (ref 0–25)
Other Cells, Fluid: 1 %
Total Nucleated Cell Count, Fluid: 874 cu mm (ref 0–1000)

## 2020-07-28 LAB — BASIC METABOLIC PANEL
Anion gap: 10 (ref 5–15)
BUN: 22 mg/dL (ref 8–23)
CO2: 35 mmol/L — ABNORMAL HIGH (ref 22–32)
Calcium: 8.9 mg/dL (ref 8.9–10.3)
Chloride: 95 mmol/L — ABNORMAL LOW (ref 98–111)
Creatinine, Ser: 0.67 mg/dL (ref 0.61–1.24)
GFR calc Af Amer: >60 mL/min (ref >60)
GFR calc non Af Amer: >60 mL/min (ref >60)
Glucose, Bld: 98 mg/dL (ref 70–99)
Potassium: 3 mmol/L — ABNORMAL LOW (ref 3.5–5.1)
Sodium: 140 mmol/L (ref 135–145)

## 2020-07-28 LAB — MAGNESIUM: Magnesium: 1.3 mg/dL — ABNORMAL LOW (ref 1.7–2.4)

## 2020-07-28 LAB — MRSA PCR SCREENING: MRSA by PCR: POSITIVE — AB

## 2020-07-28 MED ORDER — MAGNESIUM SULFATE 2 GM/50ML IV SOLN
2.0000 g | Freq: Once | INTRAVENOUS | Status: AC
  Administered 2020-07-28: 2 g via INTRAVENOUS
  Filled 2020-07-28: qty 50

## 2020-07-28 MED ORDER — METOPROLOL TARTRATE 25 MG PO TABS
25.0000 mg | ORAL_TABLET | Freq: Three times a day (TID) | ORAL | Status: DC
  Administered 2020-07-28 – 2020-07-29 (×3): 25 mg via ORAL
  Filled 2020-07-28 (×3): qty 1

## 2020-07-28 MED ORDER — CHLORHEXIDINE GLUCONATE CLOTH 2 % EX PADS
6.0000 | MEDICATED_PAD | Freq: Every day | CUTANEOUS | Status: DC
  Administered 2020-07-28 – 2020-07-31 (×4): 6 via TOPICAL

## 2020-07-28 MED ORDER — SODIUM CHLORIDE 0.9 % IV BOLUS
500.0000 mL | Freq: Once | INTRAVENOUS | Status: AC
  Administered 2020-07-28: 500 mL via INTRAVENOUS

## 2020-07-28 MED ORDER — CHLORHEXIDINE GLUCONATE CLOTH 2 % EX PADS
6.0000 | MEDICATED_PAD | Freq: Every day | CUTANEOUS | Status: DC
  Administered 2020-07-28 – 2020-07-30 (×3): 6 via TOPICAL

## 2020-07-28 MED ORDER — MIDODRINE HCL 5 MG PO TABS
5.0000 mg | ORAL_TABLET | Freq: Three times a day (TID) | ORAL | Status: DC
  Administered 2020-07-28 – 2020-08-01 (×12): 5 mg via ORAL
  Filled 2020-07-28 (×12): qty 1

## 2020-07-28 MED ORDER — MUPIROCIN 2 % EX OINT
1.0000 "application " | TOPICAL_OINTMENT | Freq: Two times a day (BID) | CUTANEOUS | Status: DC
  Administered 2020-07-28 – 2020-08-01 (×8): 1 via NASAL
  Filled 2020-07-28 (×2): qty 22

## 2020-07-28 MED ORDER — POTASSIUM CHLORIDE CRYS ER 20 MEQ PO TBCR
40.0000 meq | EXTENDED_RELEASE_TABLET | Freq: Two times a day (BID) | ORAL | Status: DC
  Administered 2020-07-28 – 2020-07-30 (×5): 40 meq via ORAL
  Filled 2020-07-28 (×5): qty 2

## 2020-07-28 NOTE — Progress Notes (Signed)
PROGRESS NOTE    Eric Bowman  UEA:[id number] DOB: September 29, 1933 DOA: 07/27/2020 PCP: Gerlene Fee, NP   Brief Narrative:  Per HPI: Eric Bowman is a 86 y.o. male with medical history significant for dementia, cervical myelopathy with inability to ambulate, CAD, OSA, gout, hyperlipidemia, hypertension, and GERD who was brought from [facility] with some hypoxemia as well as tachycardia and weakness.  His heart rate was approximately 140 bpm and he was noted to be in atrial fibrillation which was new for him.  He was also noted to be tachypneic and has not a known moderate-sized pleural effusion from ultrasound on 9/20, but thoracentesis could not be performed at that time since he could not sit upright.  He was suspected to have pneumonia a few days prior on 9/22 for which he was started on Levaquin intravenously for treatment.  9/28: Patient has been admitted with new onset atrial fibrillation with RVR and remains on Cardizem drip this morning.  Cardiology planning to wean off drip with use of metoprolol.  He has undergone right-sided thoracentesis with 1280 mL removed.  Plans to hold Lasix today given softer blood pressure readings with close monitoring.  Assessment & Plan:   Active Problems:   Atrial fibrillation with RVR ([place])   New onset atrial fibrillation with RVR-improving -2D echocardiogram 03/2020 with LVEF 55-60% -Limited 2D echocardiogram with LVEF 60-65% with mild RV dysfunction and moderate biatrial enlargement -TSH 2.3 -Maintain on Cardizem drip for rate control and wean with addition of metoprolol 25 mg 3 times daily per cardiology on 9/28 -Continue anticoagulation with Eliquis (unknown as to why patient is on this medication) -Appreciate cardiology evaluation and assistance with rate control  Right-sided pleural effusion status post thoracentesis on 9/28 -1280 mL fluid removed per radiology  History of hypertension currently with hypotension -Hold further diuresis  as well as home lisinopril -Continue to wean Cardizem and transition to metoprolol per cardiology -May need to trial amiodarone if he continues to remain hypotensive  History of CAD with prior stent -Continue aspirin and statin -No chest pain or signs of ACS noted  History of COPD/pulmonary sarcoid -Breathing treatments with Xopenex as needed with no acute bronchospasms currently noted  History of diabetes -No significant hyperglycemia noted, will try SSI for coverage and carb modified diet -Hemoglobin A1c on 05/2020 5.5%  History of dementia -Continue Aricept  History of gout -Continue allopurinol -No acute issues currently  Dyslipidemia -Continue statin  OSA with noncompliance on CPAP -Order CPAP at bedtime  GERD -PPI  Cervical myelopathy -Wheelchair-bound   DVT prophylaxis:Eliquis Code Status: DNR Family Communication: Discussed with wife on phone 9/28 Disposition Plan:  Status is: Inpatient  Remains inpatient appropriate because:Hemodynamically unstable, IV treatments appropriate due to intensity of illness or inability to take PO and Inpatient level of care appropriate due to severity of illness   Dispo: The patient is from: SNF              Anticipated d/c is to: SNF              Anticipated d/c date is: 2 days              Patient currently is not medically stable to d/c.  Patient requires ongoing rate control and stabilization per cardiology prior to discharge.   Consultants:   Cardiology  Procedures:   See below  Antimicrobials:  Anti-infectives (From admission, onward)   Start     Dose/Rate Route Frequency Ordered Stop  07/28/20 1200  vancomycin (VANCOREADY) IVPB 1500 mg/300 mL  Status:  Discontinued        1,500 mg 150 mL/hr over 120 Minutes Intravenous Every 24 hours 07/27/20 1253 07/27/20 1337   07/27/20 2000  ceFEPIme (MAXIPIME) 2 g in sodium chloride 0.9 % 100 mL IVPB  Status:  Discontinued        2 g 200 mL/hr over 30 Minutes  Intravenous Every 8 hours 07/27/20 1248 07/27/20 1337   07/27/20 1100  vancomycin (VANCOCIN) IVPB 1000 mg/200 mL premix  Status:  Discontinued        1,000 mg 200 mL/hr over 60 Minutes Intravenous  Once 07/27/20 1056 07/27/20 1057   07/27/20 1100  ceFEPIme (MAXIPIME) 2 g in sodium chloride 0.9 % 100 mL IVPB        2 g 200 mL/hr over 30 Minutes Intravenous  Once 07/27/20 1056 07/27/20 1302   07/27/20 1100  vancomycin (VANCOREADY) IVPB 2000 mg/400 mL        2,000 mg 200 mL/hr over 120 Minutes Intravenous  Once 07/27/20 1057 07/27/20 1459      Subjective: Patient seen and evaluated today with no new acute complaints or concerns. No acute concerns or events noted overnight.  His heart rates appear to be better controlled on Cardizem drip.  Objective: Vitals:   07/28/20 0400 07/28/20 0500 07/28/20 0734 07/28/20 1158  BP: 111/89 (!) 107/49    Pulse: (!) 125 (!) 108    Resp: (!) 27 (!) 22    Temp: 97.8 F (36.6 C)  (!) 97.3 F (36.3 C) 98.6 F (37 C)  TempSrc:   Axillary   SpO2: (!) 73% (!) 68%    Weight:      Height:        Intake/Output Summary (Last 24 hours) at 07/28/2020 1236 Last data filed at 07/28/2020 1224 Gross per 24 hour  Intake 1468.96 ml  Output --  Net 1468.96 ml   Filed Weights   07/27/20 1048 07/28/20 0252  Weight: 95.5 kg 93.1 kg    Examination:  General exam: Appears calm and comfortable, difficult to verbally comprehend Respiratory system: Clear to auscultation. Respiratory effort normal.  Currently on room air. Cardiovascular system: S1 & S2 heard, irregular heart rate Gastrointestinal system: Abdomen is nondistended, soft and nontender.  Central nervous system: Alert and awake Extremities: No significant edema Skin: No rashes, lesions or ulcers Psychiatry: Flat affect    Data Reviewed: I have personally reviewed following labs and imaging studies  CBC: Recent Labs  Lab 07/27/20 1145 07/28/20 0452  WBC 8.4 6.9  NEUTROABS 6.6  --   HGB 13.6  13.0  HCT 41.4 40.7  MCV 90.6 91.9  PLT 133* 825*   Basic Metabolic Panel: Recent Labs  Lab 07/23/20 0420 07/27/20 0804 07/27/20 1145 07/28/20 0452  NA 133* 138 137 140  K 3.4* 3.5 3.6 3.0*  CL 93* 95* 94* 95*  CO2 32 33* 32 35*  GLUCOSE 110* 104* 110* 98  BUN 20 29* 27* 22  CREATININE 0.62 0.88 0.86 0.67  CALCIUM 8.7* 8.4* 8.6* 8.9  MG  --   --   --  1.3*   GFR: Estimated Creatinine Clearance: 77.5 mL/min (by C-G formula based on SCr of 0.67 mg/dL). Liver Function Tests: Recent Labs  Lab 07/23/20 0420 07/27/20 1145  AST 20 31  ALT 21 28  ALKPHOS 81 83  BILITOT 0.7 1.0  PROT 5.4* 5.8*  ALBUMIN 3.2* 3.4*   No results for input(s):  LIPASE, AMYLASE in the last 168 hours. No results for input(s): AMMONIA in the last 168 hours. Coagulation Profile: Recent Labs  Lab 07/27/20 1145  INR 1.9*   Cardiac Enzymes: No results for input(s): CKTOTAL, CKMB, CKMBINDEX, TROPONINI in the last 168 hours. BNP (last 3 results) No results for input(s): PROBNP in the last 8760 hours. HbA1C: No results for input(s): HGBA1C in the last 72 hours. CBG: Recent Labs  Lab 07/27/20 1800 07/27/20 2126 07/28/20 0731 07/28/20 1208  GLUCAP 116* 134* 93 100*   Lipid Profile: No results for input(s): CHOL, HDL, LDLCALC, TRIG, CHOLHDL, LDLDIRECT in the last 72 hours. Thyroid Function Tests: Recent Labs    07/27/20 1420  TSH 2.324   Anemia Panel: No results for input(s): VITAMINB12, FOLATE, FERRITIN, TIBC, IRON, RETICCTPCT in the last 72 hours. Sepsis Labs: Recent Labs  Lab 07/27/20 1145 07/27/20 1420  PROCALCITON  --  <0.10  LATICACIDVEN 1.5 1.9    Recent Results (from the past 240 hour(s))  Respiratory Panel by RT PCR (Flu A&B, Covid) - Nasopharyngeal Swab     Status: None   Collection Time: 07/27/20 11:00 AM   Specimen: Nasopharyngeal Swab  Result Value Ref Range Status   SARS Coronavirus 2 by RT PCR NEGATIVE NEGATIVE Final    Comment: (NOTE) SARS-CoV-2 target nucleic  acids are NOT DETECTED.  The SARS-CoV-2 RNA is generally detectable in upper respiratoy specimens during the acute phase of infection. The lowest concentration of SARS-CoV-2 viral copies this assay can detect is 131 copies/mL. A negative result does not preclude SARS-Cov-2 infection and should not be used as the sole basis for treatment or other patient management decisions. A negative result may occur with  improper specimen collection/handling, submission of specimen other than nasopharyngeal swab, presence of viral mutation(s) within the areas targeted by this assay, and inadequate number of viral copies (<131 copies/mL). A negative result must be combined with clinical observations, patient history, and epidemiological information. The expected result is Negative.  Fact Sheet for Patients:  PinkCheek.be  Fact Sheet for Healthcare Providers:  GravelBags.it  This test is no t yet approved or cleared by the [place] FDA and  has been authorized for detection and/or diagnosis of SARS-CoV-2 by FDA under an Emergency Use Authorization (EUA). This EUA will remain  in effect (meaning this test can be used) for the duration of the COVID-19 declaration under Section 564(b)(1) of the Act, 21 U.S.C. section 360bbb-3(b)(1), unless the authorization is terminated or revoked sooner.     Influenza A by PCR NEGATIVE NEGATIVE Final   Influenza B by PCR NEGATIVE NEGATIVE Final    Comment: (NOTE) The Xpert Xpress SARS-CoV-2/FLU/RSV assay is intended as an aid in  the diagnosis of influenza from Nasopharyngeal swab specimens and  should not be used as a sole basis for treatment. Nasal washings and  aspirates are unacceptable for Xpert Xpress SARS-CoV-2/FLU/RSV  testing.  Fact Sheet for Patients: PinkCheek.be  Fact Sheet for Healthcare Providers: GravelBags.it  This test  is not yet approved or cleared by the [place] FDA and  has been authorized for detection and/or diagnosis of SARS-CoV-2 by  FDA under an Emergency Use Authorization (EUA). This EUA will remain  in effect (meaning this test can be used) for the duration of the  Covid-19 declaration under Section 564(b)(1) of the Act, 21  U.S.C. section 360bbb-3(b)(1), unless the authorization is  terminated or revoked. Performed at [facility], [address], [place], [place] [address]   Blood Culture (  routine x 2)     Status: None (Preliminary result)   Collection Time: 07/27/20 11:44 AM   Specimen: BLOOD LEFT FOREARM  Result Value Ref Range Status   Specimen Description BLOOD LEFT FOREARM  Final   Special Requests   Final    BOTTLES DRAWN AEROBIC AND ANAEROBIC Blood Culture adequate volume Performed at [facility], [address] [place], [place] [address]    Culture PENDING  Incomplete   Report Status PENDING  Incomplete  MRSA PCR Screening     Status: Abnormal   Collection Time: 07/27/20 12:54 PM   Specimen: Nasopharyngeal  Result Value Ref Range Status   MRSA by PCR POSITIVE (A) NEGATIVE Final    Comment:        The GeneXpert MRSA Assay (FDA approved for NASAL specimens only), is one component of a comprehensive MRSA colonization surveillance program. It is not intended to diagnose MRSA infection nor to guide or monitor treatment for MRSA infections. RESULT CALLED TO, READ BACK BY AND VERIFIED WITH: HILTON, LAWERENCE @0742  ON 07/28/20 BY JONES,T Performed at [facility], [address] [place], [place] [address]          Radiology Studies: [facility] Chest [facility] 1 View  Result Date: 07/28/2020 CLINICAL DATA:  RIGHT pleural effusion post thoracentesis EXAM: PORTABLE CHEST 1 VIEW COMPARISON:  Portable exam 1141 hours compared to 07/27/2020 FINDINGS: Enlargement of cardiac silhouette. Atherosclerotic calcification aorta. Bibasilar pleural effusions and atelectasis decreased on RIGHT  since previous exam. No pneumothorax or new infiltrate seen. IMPRESSION: No pneumothorax following thoracentesis. Residual bibasilar pleural effusions and atelectasis. Electronically Signed   By: Lavonia Dana M.D.   On: 07/28/2020 11:50   DG Chest Port 1 View  Result Date: 07/27/2020 CLINICAL DATA:  Questionable sepsis.  Shortness of breath. EXAM: PORTABLE CHEST 1 VIEW COMPARISON:  07/20/2020.  06/19/2019. FINDINGS: Cardiomegaly. Pulmonary venous congestion cannot be excluded. Bilateral interstitial prominence suggesting interstitial edema and or pneumonitis. Right-sided prominent pleural effusion again noted, decreased in size from prior chest x-[place] of 07/20/2020. Carotid vascular calcification. Prior cervical spine fusion. IMPRESSION: 1. Cardiomegaly. Pulmonary venous congestion. Bilateral interstitial prominence suggesting interstitial edema and or pneumonitis. Right-sided prominent pleural effusion again noted, decreased in size from prior chest x-[place]. 2.  Carotid vascular disease. Electronically Signed   By: Marcello Moores  Register   On: 07/27/2020 11:19   ECHOCARDIOGRAM LIMITED  Result Date: 07/27/2020    ECHOCARDIOGRAM LIMITED REPORT   Patient Name:   AMAL SAIKI Date of Exam: 07/27/2020 Medical Rec #:  [id number]     Height:       69.0 in Accession #:    [id number]    Weight:       210.6 lb Date of Birth:  [birth year]    BSA:          2.112 m Patient Age:    48 years      BP:           104/89 mmHg Patient Gender: M             HR:           107 bpm. Exam Location:  [facility] Procedure: Limited Echo, Cardiac Doppler and Limited Color Doppler Indications:    Atrial Fibrillation 427.31 / I48.91  History:        Patient has prior history of Echocardiogram examinations, most                 recent 04/10/2020. Risk Factors:Hypertension, Dyslipidemia and  Former Smoker. History of Covid 19, Radiculopathy of cervical                 spine.  Sonographer:    Alvino Chapel RCS Referring Phys: [id number]  [place]  1. Left ventricular ejection fraction, by estimation, is 60 to 65%. The left ventricle has normal function. There is mild left ventricular hypertrophy.  2. Right ventricular systolic function is mildly reduced. The right ventricular size is moderately enlarged.  3. Left atrial size was moderately dilated.  4. Right atrial size was moderately dilated.  5. The pericardial effusion is circumferential.  6. The aortic valve was not well visualized.  7. Limited echo in the setting of new onset atrial fibrillation FINDINGS  Left Ventricle: Left ventricular ejection fraction, by estimation, is 60 to 65%. The left ventricle has normal function. The left ventricular internal cavity size was normal in size. There is mild left ventricular hypertrophy. Right Ventricle: The right ventricular size is moderately enlarged. Right ventricular systolic function is mildly reduced. Left Atrium: Left atrial size was moderately dilated. Right Atrium: Right atrial size was moderately dilated. Pericardium: Trivial pericardial effusion is present. The pericardial effusion is circumferential. Aortic Valve: The aortic valve was not well visualized. Aorta: The aortic root is normal in size and structure. LEFT VENTRICLE PLAX 2D LVIDd:         4.35 cm LVIDs:         2.87 cm LV PW:         1.08 cm LV IVS:        1.21 cm LVOT diam:     2.10 cm LVOT Area:     3.46 cm  RIGHT VENTRICLE TAPSE (M-mode): 1.4 cm LEFT ATRIUM         Index      RIGHT ATRIUM           Index LA diam:    4.25 cm 2.01 cm/m RA Area:     32.90 cm                                RA Volume:   122.00 ml 57.77 ml/m   AORTA Ao Root diam: 3.40 cm TRICUSPID VALVE TR Peak grad:   32.5 mmHg TR Vmax:        285.00 cm/s  SHUNTS Systemic Diam: 2.10 cm Carlyle Dolly MD Electronically signed by Carlyle Dolly MD Signature Date/Time: 07/27/2020/4:48:15 PM    Final    US THORACENTESIS ASP PLEURAL SPACE W/IMG GUIDE  Result Date: 07/28/2020 INDICATION: RIGHT  pleural effusion EXAM: ULTRASOUND GUIDED DIAGNOSTIC AND THERAPEUTIC THORACENTESIS MEDICATIONS: None. COMPLICATIONS: None immediate. PROCEDURE: Patient was unable to provide written informed consent. Patient's wife was not available to provide consent, though she was aware of the need for the procedure. A note of medical necessity for the procedure was placed on the chart by the provider. Time-out protocol was utilized. Ultrasound was performed to localize and mark an adequate pocket of fluid in the RIGHT chest. The area was then prepped and draped in the normal sterile fashion. 1% Lidocaine was used for local anesthesia. Under ultrasound guidance a 8 French thoracentesis catheter was introduced. Thoracentesis was performed. The catheter was removed and a dressing applied. FINDINGS: A total of approximately 1280 mL of amber colored RIGHT pleural fluid was removed. Samples were sent to the laboratory as requested by the clinical team. IMPRESSION: Successful ultrasound guided RIGHT thoracentesis yielding  1280 mL of pleural fluid. Electronically Signed   By: Lavonia Dana M.D.   On: 07/28/2020 11:58        Scheduled Meds: . allopurinol  300 mg Oral Daily  . apixaban  5 mg Oral BID  . aspirin EC  81 mg Oral Daily  . atorvastatin  20 mg Oral QPM  . Chlorhexidine Gluconate Cloth  6 each Topical Daily  . Chlorhexidine Gluconate Cloth  6 each Topical Q0600  . donepezil  10 mg Oral QHS  . gabapentin  100 mg Oral QHS  . insulin aspart  0-5 Units Subcutaneous QHS  . insulin aspart  0-9 Units Subcutaneous TID WC  . metoprolol tartrate  25 mg Oral TID  . mupirocin ointment  1 application Nasal BID  . pantoprazole  40 mg Oral Daily  . potassium chloride  40 mEq Oral BID  . sodium chloride flush  3 mL Intravenous Q12H   Continuous Infusions: . sodium chloride    . diltiazem (CARDIZEM) infusion 5 mg/hr (07/28/20 0901)     LOS: 1 day    Time spent: 35 minutes    Reighlynn Swiney D Manuella Ghazi, DO [facility]  If 7PM-7AM, please contact night-coverage [email or web address] 07/28/2020, 12:36 PM

## 2020-07-28 NOTE — Procedures (Signed)
PreOperative Dx: RT pleural effusion Postoperative Dx: RT pleural effusion Procedure:   US guided RT thoracentesis Radiologist:  Thornton Papas Anesthesia:  10 ml of 1% lidocaine Specimen:  1280 mL of amber colored fluid EBL:   < 1 ml Complications: None

## 2020-07-28 NOTE — Progress Notes (Signed)
Ultrasound thoracentesis ordered for right-sided pleural effusion this a.m. which is medically necessary.  Radiology has tried contacting patient's spouse for consent with no response.

## 2020-07-28 NOTE — Consult Note (Addendum)
Cardiology Consultation:   Patient ID: Eric Bowman MRN: [id number]; DOB: [birth year]  Admit date: 07/27/2020 Date of Consult: 07/28/2020  Primary Care Provider: Gerlene Fee, NP [facility] Cardiologist: Kate Sable, MD (Inactive)   [facility] Electrophysiologist:  None     Patient Profile:   GEARALD Bowman is a 86 y.o. male with a hx of CAD who is being seen today for the evaluation of Afib with RVR at the request of Dr. Manuella Ghazi.  History of Present Illness:   [name] is an 86 year old male patient with history of CAD status post remote stent placement, hypertension and hyperlipidemia, dementia, cervical myelopathy with inability to ambulate, HLD, OSA.  He was seen in our office 02/2020 for preoperative clearance and underwent Lexiscan Myoview which showed multiple defects with possible mild ischemia EF 15%.  2D echo was obtained and EF was 55 to 60% no wall motion abnormalities with aortic valve sclerosis.  Patient was admitted yesterday from the [facility] with new onset rapid atrial fibrillation and hypoxemia.  He was being treated for suspected pneumonia and also has a known moderate-sized pleural effusion on ultrasound 07/20/2020 but could not perform thoracentesis because he cannot sit upright. was been on Eliquis on admission unclear reason. Patient seems alert is eating with assistance but has had confusion and speech is near impossible to understand.  Past Medical History:  Diagnosis Date  . Cancer (HCC)    melanoma-left side  . Chronic pain    legs and feet  . Coronary artery disease   . Dementia ([place])   . Diabetes mellitus without complication ([place])   . Foot drop   . GERD (gastroesophageal reflux disease)   . Gout   . Hyperlipidemia   . Hypertension   . OCD (obsessive compulsive disorder)   . PONV (postoperative nausea and vomiting)   . Psychosis ([place])   . Reflux   . Sarcoidosis   . Sleep apnea    cpap    Past Surgical History:  Procedure  Laterality Date  . BACK SURGERY     cervical neck  . CATARACT EXTRACTION W/PHACO  01/05/2012   Procedure: CATARACT EXTRACTION PHACO AND INTRAOCULAR LENS PLACEMENT (IOC);  Surgeon: [name] , MD;  Location: [facility];  Service: Ophthalmology;  Laterality: Left;  CDE 18.47  . CATARACT EXTRACTION W/PHACO  02/02/2012   Procedure: CATARACT EXTRACTION PHACO AND INTRAOCULAR LENS PLACEMENT (IOC);  Surgeon: [name] , MD;  Location: [facility];  Service: Ophthalmology;  Laterality: Right;  CDE:15.38  . CHOLECYSTECTOMY    . GALLBLADDER SURGERY    . hemorhoidectomy    . MELANOMA EXCISION     left side-[name]  . TONSILLECTOMY       Home Medications:  Prior to Admission medications   Medication Sig Start Date End Date Taking? Authorizing Provider  acetaminophen (TYLENOL) 500 MG tablet Take 500 mg by mouth every 6 (six) hours as needed. For pain    Yes [provider]  allopurinol (ZYLOPRIM) 300 MG tablet Take 300 mg by mouth daily.     Yes [provider]  apixaban (ELIQUIS) 5 MG TABS tablet Take 5 mg by mouth 2 (two) times daily. 07/21/20  Yes [provider]  aspirin EC 81 MG tablet Take 81 mg by mouth daily.   Yes [provider]  [name]) OINT Special Instructions: Apply to sacrum/coccyx and bilateral buttocks qshift prn erythema. 09/27/19  Yes [provider]  donepezil (ARICEPT) 10 MG tablet Take 10  mg by mouth at bedtime.  02/19/14  Yes [provider]  gabapentin (NEURONTIN) 100 MG capsule Take 100 mg by mouth at bedtime.   Yes [provider]  ipratropium-albuterol (DUONEB) 0.5-2.5 (3) MG/3ML SOLN Take 3 mLs by nebulization every 6 (six) hours as needed. 07/15/20  Yes [provider]  levofloxacin (LEVAQUIN) 25 MG/ML solution Take 750 mg by mouth daily. 07/21/20 08/02/20 Yes [provider]  levofloxacin (LEVAQUIN) 750 MG/150ML SOLN Inject 750 mg into the vein daily. 07/21/20  Yes [provider]  lisinopril (ZESTRIL) 5 MG tablet Take 1 tablet (5 mg total) by mouth daily. 06/22/19  Yes Sinda Du, MD  meloxicam (MOBIC) 7.5 MG tablet Take 7.5 mg by mouth 2 (two) times daily. 07/20/20  Yes [provider]  pantoprazole (PROTONIX) 40 MG tablet Take 1 tablet (40 mg total) by mouth daily. 06/22/19  Yes Sinda Du, MD  torsemide (DEMADEX) 20 MG tablet Take 40 mg by mouth daily. 07/22/20  Yes [provider]  atorvastatin (LIPITOR) 20 MG tablet Take 20 mg by mouth every evening. 06/21/20   [provider]  potassium chloride SA (KLOR-CON) 20 MEQ tablet Take 20 mEq by mouth daily. 07/23/20   [provider]  Vitamins A & D (VITAMIN A & D) ointment 3 (three) times daily as needed for dry skin. Apply A&D ointment to left heel qshift & prn for erythema. 06/30/20   [provider]    Inpatient Medications: Scheduled Meds: . allopurinol  300 mg Oral Daily  . apixaban  5 mg Oral BID  . aspirin EC  81 mg Oral Daily  . atorvastatin  20 mg Oral QPM  . Chlorhexidine Gluconate Cloth  6 each Topical Daily  . donepezil  10 mg Oral QHS  . furosemide  40 mg Intravenous Q12H  . gabapentin  100 mg Oral QHS  . insulin aspart  0-5 Units Subcutaneous QHS  . insulin aspart  0-9 Units Subcutaneous TID WC  . pantoprazole  40 mg Oral Daily  . sodium chloride flush  3 mL Intravenous Q12H   Continuous Infusions: . sodium chloride    . diltiazem (CARDIZEM) infusion 5 mg/hr (07/27/20 2353)   PRN Meds: sodium chloride, acetaminophen, levalbuterol, ondansetron **OR** ondansetron (ZOFRAN) IV, sodium chloride flush  Allergies:    Allergies  Allergen Reactions  . Other   . Penicillin G   . Sulfonic Acid (3,5-Dibromo-4-H-Ox-Benz)   . Penicillins Rash  . Sulfonamide Derivatives Rash  . Tetanus Toxoid Rash    Social History:   Social History   Socioeconomic History  . Marital status: Married    Spouse name: Not on file  . Number of children: Not on file   . Years of education: Not on file  . Highest education level: Not on file  Occupational History  . Occupation: retired   Tobacco Use  . Smoking status: Former Smoker    Packs/day: 0.25    Years: 20.00    Pack years: 5.00    Types: Pipe    Quit date: 01/02/1974    Years since quitting: 46.6  . Smokeless tobacco: Never Used  Vaping Use  . Vaping Use: Never used  Substance and Sexual Activity  . Alcohol use: No  . Drug use: No  . Sexual activity: Not Currently  Other Topics Concern  . Not on file  Social History Narrative   Former pipe smoker, quit many years ago.   Long term resident of [facility]  Social Determinants of Health   Financial Resource Strain: Low Risk   . Difficulty of Paying Living Expenses: Not hard at all  Food Insecurity: No Food Insecurity  . Worried About Charity fundraiser in the Last Year: Never true  . Ran Out of Food in the Last Year: Never true  Transportation Needs: No Transportation Needs  . Lack of Transportation (Medical): No  . Lack of Transportation (Non-Medical): No  Physical Activity: Inactive  . Days of Exercise per Week: 0 days  . Minutes of Exercise per Session: 0 min  Stress: No Stress Concern Present  . Feeling of Stress : Not at all  Social Connections: Socially Isolated  . Frequency of Communication with Friends and Family: Never  . Frequency of Social Gatherings with Friends and Family: Never  . Attends Religious Services: Never  . Active Member of Clubs or Organizations: No  . Attends Archivist Meetings: Not asked  . Marital Status: Married  Human resources officer Violence: Not At Risk  . Fear of Current or Ex-Partner: No  . Emotionally Abused: No  . Physically Abused: No  . Sexually Abused: No    Family History:     Family History  Problem Relation Age of Onset  . Heart disease Other   . Arthritis Other   . Cancer Other   . Anesthesia problems Neg Hx   . Hypotension Neg Hx   . Malignant hyperthermia Neg Hx   .  Pseudochol deficiency Neg Hx      ROS:  Please see the history of present illness.  Review of Systems  Reason unable to perform ROS: confusion and speech difficulty.    All other ROS reviewed and negative.     Physical Exam/Data:   Vitals:   07/28/20 0200 07/28/20 0252 07/28/20 0400 07/28/20 0500  BP: 113/76  111/89 (!) 107/49  Pulse:   (!) 125 (!) 108  Resp: 13  (!) 27 (!) 22  Temp:  97.8 F (36.6 C) 97.8 F (36.6 C)   TempSrc:  Axillary    SpO2:   (!) 73% (!) 68%  Weight:  93.1 kg    Height:  5\' 9"  (1.753 m)      Intake/Output Summary (Last 24 hours) at 07/28/2020 0729 Last data filed at 07/27/2020 2353 Gross per 24 hour  Intake 1228.96 ml  Output --  Net 1228.96 ml   Last 3 Weights 07/28/2020 07/27/2020 07/22/2020  Weight (lbs) 205 lb 4 oz 210 lb 9.6 oz 210 lb 9.6 oz  Weight (kg) 93.1 kg 95.528 kg 95.528 kg     Body mass index is 30.31 kg/m.  General:  Well nourished, well developed, in no acute distress HEENT: normal Lymph: no adenopathy Neck: no JVD Endocrine:  No thryomegaly Vascular: No carotid bruits; FA pulses 2+ bilaterally without bruits  Cardiac:  normal S1, S2; irreg irreg 1/6 sys murmur  Lungs:  Decrease breath sounds right lung base, left clear Abd: soft, nontender, no hepatomegaly  Ext: plus 1 edema Musculoskeletal:  No deformities, BUE and BLE strength normal and equal Skin: warm and dry  Neuro:  CNs 2-12 intact, no focal abnormalities noted Psych:  Normal affect    EKG:  The EKG was personally reviewed and demonstrates:  Atrial fib 145/m with poor R wave progression ant Telemetry:  Telemetry was personally reviewed and demonstrates:  Afib at 100/m  Relevant CV Studies: 2D echo 6/2021IMPRESSIONS     1. Left ventricular ejection fraction, by estimation, is 55  to 60%. The  left ventricle has normal function. The left ventricle has no regional  wall motion abnormalities. Left ventricular diastolic parameters are  consistent with age-related  delayed  relaxation (normal).   2. Right ventricular systolic function is normal. The right ventricular  size is normal. There is normal pulmonary artery systolic pressure.   3. The mitral valve is normal in structure. No evidence of mitral valve  regurgitation. No evidence of mitral stenosis.   4. The aortic valve is tricuspid. Aortic valve regurgitation is not  visualized. Mild to moderate aortic valve sclerosis/calcification is  present, without any evidence of aortic stenosis.   5. The inferior vena cava is normal in size with greater than 50%  respiratory variability, suggesting right atrial pressure of 3 mmHg.   Lexiscan Myoview 03/20/2020 No diagnostic ST segment changes to indicate ischemia. Small, moderate intensity, apical septal and apical to basal inferolateral defects. The inferolateral defect is fixed and consistent with possible scar and the apical septal defect is partially reversible consistent with a mild ischemic territory. This is a high risk study based on calculated LVEF. Would suggest confirmatory echocardiogram. Nuclear stress EF: 15%.  Laboratory Data:  High Sensitivity Troponin:  No results for input(s): TROPONINIHS in the last 720 hours.   Chemistry Recent Labs  Lab 07/27/20 0804 07/27/20 1145 07/28/20 0452  NA 138 137 140  K 3.5 3.6 3.0*  CL 95* 94* 95*  CO2 33* 32 35*  GLUCOSE 104* 110* 98  BUN 29* 27* 22  CREATININE 0.88 0.86 0.67  CALCIUM 8.4* 8.6* 8.9  GFRNONAA >60 >60 >60  GFRAA >60 >60 >60  ANIONGAP 10 11 10     Recent Labs  Lab 07/23/20 0420 07/27/20 1145  PROT 5.4* 5.8*  ALBUMIN 3.2* 3.4*  AST 20 31  ALT 21 28  ALKPHOS 81 83  BILITOT 0.7 1.0   Hematology Recent Labs  Lab 07/27/20 1145 07/28/20 0452  WBC 8.4 6.9  RBC 4.57 4.43  HGB 13.6 13.0  HCT 41.4 40.7  MCV 90.6 91.9  MCH 29.8 29.3  MCHC 32.9 31.9  RDW 15.8* 15.6*  PLT 133* 120*   BNPNo results for input(s): BNP, PROBNP in the last 168 hours.  DDimer  Recent Labs   Lab 07/21/20 1700  DDIMER 1.87*     Radiology/Studies:  DG Chest Port 1 View  Result Date: 07/27/2020 CLINICAL DATA:  Questionable sepsis.  Shortness of breath. EXAM: PORTABLE CHEST 1 VIEW COMPARISON:  07/20/2020.  06/19/2019. FINDINGS: Cardiomegaly. Pulmonary venous congestion cannot be excluded. Bilateral interstitial prominence suggesting interstitial edema and or pneumonitis. Right-sided prominent pleural effusion again noted, decreased in size from prior chest x-Kieran of 07/20/2020. Carotid vascular calcification. Prior cervical spine fusion. IMPRESSION: 1. Cardiomegaly. Pulmonary venous congestion. Bilateral interstitial prominence suggesting interstitial edema and or pneumonitis. Right-sided prominent pleural effusion again noted, decreased in size from prior chest x-Tyhir. 2.  Carotid vascular disease. Electronically Signed   By: Marcello Moores  Register   On: 07/27/2020 11:19   ECHOCARDIOGRAM LIMITED  Result Date: 07/27/2020    ECHOCARDIOGRAM LIMITED REPORT   Patient Name:   Eric Bowman Date of Exam: 07/27/2020 Medical Rec #:  [id number]     Height:       69.0 in Accession #:    [id number]    Weight:       210.6 lb Date of Birth:  08-31-1933    BSA:          2.112 m Patient Age:  86 years      BP:           104/89 mmHg Patient Gender: M             HR:           107 bpm. Exam Location:  [facility] Procedure: Limited Echo, Cardiac Doppler and Limited Color Doppler Indications:    Atrial Fibrillation 427.31 / I48.91  History:        Patient has prior history of Echocardiogram examinations, most                 recent 04/10/2020. Risk Factors:Hypertension, Dyslipidemia and                 Former Smoker. History of Covid 19, Radiculopathy of cervical                 spine.  Sonographer:    Alvino Chapel RCS Referring Phys: [id number] [place]  1. Left ventricular ejection fraction, by estimation, is 60 to 65%. The left ventricle has normal function. There is mild left ventricular hypertrophy.   2. Right ventricular systolic function is mildly reduced. The right ventricular size is moderately enlarged.  3. Left atrial size was moderately dilated.  4. Right atrial size was moderately dilated.  5. The pericardial effusion is circumferential.  6. The aortic valve was not well visualized.  7. Limited echo in the setting of new onset atrial fibrillation FINDINGS  Left Ventricle: Left ventricular ejection fraction, by estimation, is 60 to 65%. The left ventricle has normal function. The left ventricular internal cavity size was normal in size. There is mild left ventricular hypertrophy. Right Ventricle: The right ventricular size is moderately enlarged. Right ventricular systolic function is mildly reduced. Left Atrium: Left atrial size was moderately dilated. Right Atrium: Right atrial size was moderately dilated. Pericardium: Trivial pericardial effusion is present. The pericardial effusion is circumferential. Aortic Valve: The aortic valve was not well visualized. Aorta: The aortic root is normal in size and structure. LEFT VENTRICLE PLAX 2D LVIDd:         4.35 cm LVIDs:         2.87 cm LV PW:         1.08 cm LV IVS:        1.21 cm LVOT diam:     2.10 cm LVOT Area:     3.46 cm  RIGHT VENTRICLE TAPSE (M-mode): 1.4 cm LEFT ATRIUM         Index      RIGHT ATRIUM           Index LA diam:    4.25 cm 2.01 cm/m RA Area:     32.90 cm                                RA Volume:   122.00 ml 57.77 ml/m   AORTA Ao Root diam: 3.40 cm TRICUSPID VALVE TR Peak grad:   32.5 mmHg TR Vmax:        285.00 cm/s  SHUNTS Systemic Diam: 2.10 cm Carlyle Dolly MD Electronically signed by Carlyle Dolly MD Signature Date/Time: 07/27/2020/4:48:15 PM    Final    {    New York Heart Association (NYHA) Functional Class NYHA Class III  Assessment and Plan:   Atrial fibrillation with RVR new onset for patient in the setting of hypoxemia,  pneumonia and pleural effusion. HR  better controlled on IV diltiazem, can try to transition  to oral. Already on Eliquis for unclear reasons. CHADSVASC= at least 6  History of CAD with remote stenting cardiac catheterization studies not available to review, abnormal Lexiscan 02/2020 for wall motion abnormalities but echo was normal 03/2020, limited echo yest normal LV function, mildly reduced RV function  Lower ext edema and pulm venous congestion on CXR as well as right sided pleural effusion. Suspect a degree of diastolic CHF. Would try to diurese. Going for thoracentesis today. He did receive IV lasix last night and bag just emptied over 900 cc I/O's not charted yet.  Essential hypertension BP's running low  Hyperlipidemia  OSA-hasn't used CPAP for a year  Cervical myelopathy with inability to ambulate      For questions or updates, please contact [place] Please consult [email or web address] for contact info under    Signed, Ermalinda Barrios, PA-C  07/28/2020 7:29 AM   Attending note Patient seen and discussed with PA Bonnell Public, I agree with her documentation. 86 yo male history of CAD with remote stent placement though unclear history, dementia, DM2, HL, HTN admitted with elevated heart rate and SOB. Found to be in afib with RVR on presentation, new diagnosis. Started on dilt gtt, had already been on eliquis   K 3.5 Cr 0.88 BUN 29 Lactic acid 1.5 TSH 2.3  COVID neg CXR pulm congestion, right sided effusion EKG afib with RVR Echo LVE 60-65%, mild RV dysfunction, mod BAE  New diagnosis of afib, presented with RVR. He is on eliquis 5mg  bid, dilt gtt. Soft bp's at times, try to covert to oral lopressor 25mg  tid and wean dilt gtt. If bp's don't tolerate would use amiodarone. History of COPD, notes mention history of sarcoid but unclear history. With advanced age and dementia would still be open to using amio given limited other options if needed.  Elevated BNP, signs of fluid overload by CXR. Soft bp's, would avoid diuresis today. Is due for some fluid removal with thoracentesis  today.    Carlyle Dolly MD

## 2020-07-29 DIAGNOSIS — J90 Pleural effusion, not elsewhere classified: Secondary | ICD-10-CM

## 2020-07-29 DIAGNOSIS — I25119 Atherosclerotic heart disease of native coronary artery with unspecified angina pectoris: Secondary | ICD-10-CM

## 2020-07-29 DIAGNOSIS — Z9889 Other specified postprocedural states: Secondary | ICD-10-CM

## 2020-07-29 LAB — BASIC METABOLIC PANEL
Anion gap: 9 (ref 5–15)
BUN: 27 mg/dL — ABNORMAL HIGH (ref 8–23)
CO2: 32 mmol/L (ref 22–32)
Calcium: 8.4 mg/dL — ABNORMAL LOW (ref 8.9–10.3)
Chloride: 98 mmol/L (ref 98–111)
Creatinine, Ser: 0.81 mg/dL (ref 0.61–1.24)
GFR calc Af Amer: >60 mL/min (ref >60)
GFR calc non Af Amer: >60 mL/min (ref >60)
Glucose, Bld: 101 mg/dL — ABNORMAL HIGH (ref 70–99)
Potassium: 3.5 mmol/L (ref 3.5–5.1)
Sodium: 139 mmol/L (ref 135–145)

## 2020-07-29 LAB — GLUCOSE, CAPILLARY
Glucose-Capillary: 274 mg/dL — ABNORMAL HIGH (ref 70–99)
Glucose-Capillary: 60 mg/dL — ABNORMAL LOW (ref 70–99)
Glucose-Capillary: 120 mg/dL — ABNORMAL HIGH (ref 70–99)
Glucose-Capillary: 99 mg/dL (ref 70–99)
Glucose-Capillary: 96 mg/dL (ref 70–99)

## 2020-07-29 LAB — CBC
HCT: 42.1 % (ref 39.0–52.0)
Hemoglobin: 13 g/dL (ref 13.0–17.0)
MCH: 28.4 pg (ref 26.0–34.0)
MCHC: 30.9 g/dL (ref 30.0–36.0)
MCV: 91.9 fL (ref 80.0–100.0)
Platelets: 145 10*3/uL — ABNORMAL LOW (ref 150–400)
RBC: 4.58 MIL/uL (ref 4.22–5.81)
RDW: 15.7 % — ABNORMAL HIGH (ref 11.5–15.5)
WBC: 8.1 10*3/uL (ref 4.0–10.5)
nRBC: 0 % (ref 0.0–0.2)

## 2020-07-29 LAB — LACTIC ACID, PLASMA: Lactic Acid, Venous: 1.3 mmol/L (ref 0.5–1.9)

## 2020-07-29 LAB — MAGNESIUM: Magnesium: 1.8 mg/dL (ref 1.7–2.4)

## 2020-07-29 MED ORDER — DEXTROSE 50 % IV SOLN
INTRAVENOUS | Status: AC
  Administered 2020-07-29: 50 mL
  Filled 2020-07-29: qty 50

## 2020-07-29 MED ORDER — METOPROLOL TARTRATE 25 MG PO TABS
25.0000 mg | ORAL_TABLET | Freq: Four times a day (QID) | ORAL | Status: DC
  Administered 2020-07-30 (×2): 25 mg via ORAL
  Filled 2020-07-29 (×2): qty 1

## 2020-07-29 NOTE — Progress Notes (Signed)
Progress Note  Patient Name: Eric Bowman Date of Encounter: 07/29/2020  Primary Cardiologist: Carlyle Dolly, MD  Subjective   Patient resting this morning.  Inpatient Medications    Scheduled Meds: . allopurinol  300 mg Oral Daily  . apixaban  5 mg Oral BID  . aspirin EC  81 mg Oral Daily  . atorvastatin  20 mg Oral QPM  . Chlorhexidine Gluconate Cloth  6 each Topical Daily  . Chlorhexidine Gluconate Cloth  6 each Topical Q0600  . donepezil  10 mg Oral QHS  . gabapentin  100 mg Oral QHS  . insulin aspart  0-5 Units Subcutaneous QHS  . insulin aspart  0-9 Units Subcutaneous TID WC  . metoprolol tartrate  25 mg Oral TID  . midodrine  5 mg Oral TID WC  . mupirocin ointment  1 application Nasal BID  . pantoprazole  40 mg Oral Daily  . potassium chloride  40 mEq Oral BID  . sodium chloride flush  3 mL Intravenous Q12H   Continuous Infusions: . sodium chloride    . diltiazem (CARDIZEM) infusion 5 mg/hr (07/28/20 0901)   PRN Meds: sodium chloride, acetaminophen, levalbuterol, ondansetron **OR** ondansetron (ZOFRAN) IV, sodium chloride flush   Vital Signs    Vitals:   07/29/20 0400 07/29/20 0500 07/29/20 0600 07/29/20 0700  BP: 108/82 107/62 (!) 127/94 135/75  Pulse: (!) 101 87 99 (!) 32  Resp: 18 (!) 24 (!) 24 16  Temp:    98.1 F (36.7 C)  TempSrc:    Axillary  SpO2: 90% 92% 96% 99%  Weight:      Height:        Intake/Output Summary (Last 24 hours) at 07/29/2020 0804 Last data filed at 07/28/2020 2100 Gross per 24 hour  Intake 412.5 ml  Output 350 ml  Net 62.5 ml   Filed Weights   07/27/20 1048 07/28/20 0252  Weight: 95.5 kg 93.1 kg    Telemetry    Atrial fibrillation.  Personally reviewed.  ECG    An ECG dated 07/27/2020 was personally reviewed today and demonstrated:  Rapid atrial fibrillation with lead motion artifact, poor R wave progression, nonspecific T wave changes.  Physical Exam   GEN:  Elderly male.  No acute distress.   Neck: No  JVD. Cardiac:  Irregularly irregular, no gallop.  Respiratory:  Scattered rhonchi anteriorly. GI: Soft, bowel sounds present. MS:  2+ edema; No deformity.  Labs    Chemistry Recent Labs  Lab 07/23/20 0420 07/27/20 0804 07/27/20 1145 07/28/20 0452 07/29/20 0401  NA 133*   < > 137 140 139  K 3.4*   < > 3.6 3.0* 3.5  CL 93*   < > 94* 95* 98  CO2 32   < > 32 35* 32  GLUCOSE 110*   < > 110* 98 101*  BUN 20   < > 27* 22 27*  CREATININE 0.62   < > 0.86 0.67 0.81  CALCIUM 8.7*   < > 8.6* 8.9 8.4*  PROT 5.4*  --  5.8*  --   --   ALBUMIN 3.2*  --  3.4*  --   --   AST 20  --  31  --   --   ALT 21  --  28  --   --   ALKPHOS 81  --  83  --   --   BILITOT 0.7  --  1.0  --   --   GFRNONAA >60   < > >  60 >60 >60  GFRAA >60   < > >60 >60 >60  ANIONGAP 8   < > 11 10 9    < > = values in this interval not displayed.     Hematology Recent Labs  Lab 07/27/20 1145 07/28/20 0452 07/29/20 0401  WBC 8.4 6.9 8.1  RBC 4.57 4.43 4.58  HGB 13.6 13.0 13.0  HCT 41.4 40.7 42.1  MCV 90.6 91.9 91.9  MCH 29.8 29.3 28.4  MCHC 32.9 31.9 30.9  RDW 15.8* 15.6* 15.7*  PLT 133* 120* 145*    Radiology    DG Chest Port 1 View  Result Date: 07/28/2020 CLINICAL DATA:  RIGHT pleural effusion post thoracentesis EXAM: PORTABLE CHEST 1 VIEW COMPARISON:  Portable exam 1141 hours compared to 07/27/2020 FINDINGS: Enlargement of cardiac silhouette. Atherosclerotic calcification aorta. Bibasilar pleural effusions and atelectasis decreased on RIGHT since previous exam. No pneumothorax or new infiltrate seen. IMPRESSION: No pneumothorax following thoracentesis. Residual bibasilar pleural effusions and atelectasis. Electronically Signed   By: Lavonia Dana M.D.   On: 07/28/2020 11:50   DG Chest Port 1 View  Result Date: 07/27/2020 CLINICAL DATA:  Questionable sepsis.  Shortness of breath. EXAM: PORTABLE CHEST 1 VIEW COMPARISON:  07/20/2020.  06/19/2019. FINDINGS: Cardiomegaly. Pulmonary venous congestion cannot be  excluded. Bilateral interstitial prominence suggesting interstitial edema and or pneumonitis. Right-sided prominent pleural effusion again noted, decreased in size from prior chest x-N[facility]htali of 07/20/2020. Carotid vascular calcification. Prior cervical spine fusion. IMPRESSION: 1. Cardiomegaly. Pulmonary venous congestion. Bilateral interstitial prominence suggesting interstitial edema and or pneumonitis. Right-sided prominent pleural effusion again noted, decreased in size from prior chest x-[facility]alous. 2.  Carotid vascular disease. Electronically Signed   By: Marcello Moores  Register   On: 07/27/2020 11:19   ECHOCARDIOGRAM LIMITED  Result Date: 07/27/2020    ECHOCARDIOGRAM LIMITED REPORT   Patient Name:   Eric Bowman Date of Exam: 07/27/2020 Medical Rec #:  [id number]     Height:       69.0 in Accession #:    [id number]    Weight:       210.6 lb Date of Birth:  Jan 01, 1933    BSA:          2.112 m Patient Age:    86 years      BP:           104/89 mmHg Patient Gender: M             HR:           107 bpm. Exam Location:  [facility] Procedure: Limited Echo, Cardiac Doppler and Limited Color Doppler Indications:    Atrial Fibrillation 427.31 / I48.91  History:        Patient has prior history of Echocardiogram examinations, most                 recent 04/10/2020. Risk Factors:Hypertension, Dyslipidemia and                 Former Smoker. History of Covid 19, Radiculopathy of cervical                 spine.  Sonographer:    Alvino Chapel RCS Referring Phys: [id number] [place] D [place]  1. Left ventricular ejection fraction, by estimation, is 60 to 65%. The left ventricle has normal function. There is mild left ventricular hypertrophy.  2. Right ventricular systolic function is mildly reduced. The right ventricular size is moderately enlarged.  3. Left atrial size was moderately  dilated.  4. Right atrial size was moderately dilated.  5. The pericardial effusion is circumferential.  6. The aortic valve was not well visualized.  7.  Limited echo in the setting of new onset atrial fibrillation FINDINGS  Left Ventricle: Left ventricular ejection fraction, by estimation, is 60 to 65%. The left ventricle has normal function. The left ventricular internal cavity size was normal in size. There is mild left ventricular hypertrophy. Right Ventricle: The right ventricular size is moderately enlarged. Right ventricular systolic function is mildly reduced. Left Atrium: Left atrial size was moderately dilated. Right Atrium: Right atrial size was moderately dilated. Pericardium: Trivial pericardial effusion is present. The pericardial effusion is circumferential. Aortic Valve: The aortic valve was not well visualized. Aorta: The aortic root is normal in size and structure. LEFT VENTRICLE PLAX 2D LVIDd:         4.35 cm LVIDs:         2.87 cm LV PW:         1.08 cm LV IVS:        1.21 cm LVOT diam:     2.10 cm LVOT Area:     3.46 cm  RIGHT VENTRICLE TAPSE (M-mode): 1.4 cm LEFT ATRIUM         Index      RIGHT ATRIUM           Index LA diam:    4.25 cm 2.01 cm/m RA Area:     32.90 cm                                RA Volume:   122.00 ml 57.77 ml/m   AORTA Ao Root diam: 3.40 cm TRICUSPID VALVE TR Peak grad:   32.5 mmHg TR Vmax:        285.00 cm/s  SHUNTS Systemic Diam: 2.10 cm Carlyle Dolly MD Electronically signed by Carlyle Dolly MD Signature Date/Time: 07/27/2020/4:48:15 PM    Final    US THORACENTESIS ASP PLEURAL SPACE W/IMG GUIDE  Result Date: 07/28/2020 INDICATION: RIGHT pleural effusion EXAM: ULTRASOUND GUIDED DIAGNOSTIC AND THERAPEUTIC THORACENTESIS MEDICATIONS: None. COMPLICATIONS: None immediate. PROCEDURE: Patient was unable to provide written informed consent. Patient's wife was not available to provide consent, though she was aware of the need for the procedure. A note of medical necessity for the procedure was placed on the chart by the provider. Time-out protocol was utilized. Ultrasound was performed to localize and mark an adequate  pocket of fluid in the RIGHT chest. The area was then prepped and draped in the normal sterile fashion. 1% Lidocaine was used for local anesthesia. Under ultrasound guidance a 8 French thoracentesis catheter was introduced. Thoracentesis was performed. The catheter was removed and a dressing applied. FINDINGS: A total of approximately 1280 mL of amber colored RIGHT pleural fluid was removed. Samples were sent to the laboratory as requested by the clinical team. IMPRESSION: Successful ultrasound guided RIGHT thoracentesis yielding 1280 mL of pleural fluid. Electronically Signed   By: Lavonia Dana M.D.   On: 07/28/2020 11:58    Patient Profile     86 y.o. male with history of CAD and remote stent intervention (details not clear), hypertension, hyperlipidemia, type 2 diabetes mellitus, reported sarcoidosis, GERD, and dementia, presenting with newly documented atrial fibrillation.  Assessment & Plan    1.  Atrial fibrillation with RVR, persistent and newly documented but absolute duration is uncertain.  CHA2DS2-VASc score is 5.  Echocardiogram  shows LVEF 60 to 65% with moderate left atrial enlargement.  Currently on Eliquis for stroke prophylaxis, transition from IV diltiazem to oral Lopressor for heart rate control.  2.  CAD and remote history of stent intervention, details not clear.  History limited by dementia.  Follow-up ischemic testing from May of this year was consistent with ischemic heart disease, inferolateral scar and apical septal ischemia, managed medically.  3.  Right pleural effusion status post thoracentesis with removal of 1280 cc.  4.  Mixed hyperlipidemia on Lipitor, most recent LDL 43.  5.  OSA, recently without regular use of CPAP.  6.  Cervical myelopathy, nonambulatory.  7.  COPD and reported history of sarcoidosis.  I reviewed the chart and cardiology consultation by Dr. Harl Bowie.  Agree with transition to oral Lopressor, will uptitrate dose for better heart rate control  presuming blood pressure allows.  He is also on ProAmatine.  Continue Eliquis for stroke prophylaxis.  Discontinue aspirin for now, continue statin therapy.  No plan for cardioversion, particularly if heart rate can be reasonably well controlled.  Signed, Rozann Lesches, MD  07/29/2020, 8:04 AM

## 2020-07-29 NOTE — Progress Notes (Signed)
PROGRESS NOTE    [name]  JJO:[id number] DOB: May 22, 1933 DOA: 07/27/2020 PCP: Gerlene Fee, NP   Brief Narrative:  Per HPI: Eric Bowman is a 86 y.o. male with medical history significant for dementia, cervical myelopathy with inability to ambulate, CAD, OSA, gout, hyperlipidemia, hypertension, and GERD who was brought from [facility] with some hypoxemia as well as tachycardia and weakness.  His heart rate was approximately 140 bpm and he was noted to be in atrial fibrillation which was new for him.  He was also noted to be tachypneic and has not a known moderate-sized pleural effusion from ultrasound on 9/20, but thoracentesis could not be performed at that time since he could not sit upright.  He was suspected to have pneumonia a few days prior on 9/22 for which he was started on Levaquin intravenously for treatment.  9/28: Patient has been admitted with new onset atrial fibrillation with RVR and remains on Cardizem drip this morning.  Cardiology planning to wean off drip with use of metoprolol.  He has undergone right-sided thoracentesis with 1280 mL removed.  Plans to hold Lasix today given softer blood pressure readings with close monitoring.  9/29: HR better controlled.  Poor appetite with hypoglycemia.   Assessment & Plan:   Active Problems:   Atrial fibrillation with RVR ([place])   New onset atrial fibrillation with RVR-improving -2D echocardiogram 03/2020 with LVEF 55-60% -Limited 2D echocardiogram with LVEF 60-65% with mild RV dysfunction and moderate biatrial enlargement -TSH 2.3 -Maintain on Cardizem drip for rate control and wean with addition of metoprolol 25 mg 3 times daily per cardiology on 9/28 -Continue anticoagulation with Eliquis (unknown as to why patient is on this medication) -Appreciate cardiology evaluation and assistance with rate control  Right-sided pleural effusion status post thoracentesis on 9/28 -1280 mL fluid removed per radiology  History  of hypertension currently with hypotension -Hold further diuresis as well as home lisinopril -Continue to wean Cardizem and transition to metoprolol per cardiology -May need to trial amiodarone if he continues to remain hypotensive  History of CAD with prior stent -Continue aspirin and statin -No chest pain or signs of ACS noted  History of COPD/pulmonary sarcoid -Breathing treatments with Xopenex as needed with no acute bronchospasms currently noted  History of diabetes -No significant hyperglycemia noted, will try SSI for coverage and carb modified diet -Hemoglobin A1c on 05/2020 5.5%  CBG (last 3)  Recent Labs    07/28/20 2140 07/29/20 0723 07/29/20 1103  GLUCAP 98 274* 60*    Hypoglycemia - due to poor oral intake, treated with dextrose  History of dementia -Continue Aricept  History of gout -Continue allopurinol -No acute issues currently  Dyslipidemia -Continue statin  OSA with noncompliance on CPAP -Order CPAP at bedtime  GERD -PPI  Cervical myelopathy -Wheelchair-bound  DVT prophylaxis:Eliquis Code Status: DNR Family Communication: Discussed with wife on phone 9/28,  Disposition Plan:  Status is: Inpatient  Remains inpatient appropriate because:Hemodynamically unstable, IV treatments appropriate due to intensity of illness or inability to take PO and Inpatient level of care appropriate due to severity of illness  Dispo: The patient is from: SNF              Anticipated d/c is to: SNF              Anticipated d/c date is: 2 days              Patient currently is not medically stable to d/c.  Patient requires ongoing rate control and stabilization per cardiology prior to discharge.   Consultants:   Cardiology  Procedures:   See below  Antimicrobials:  Anti-infectives (From admission, onward)   Start     Dose/Rate Route Frequency Ordered Stop   07/28/20 1200  vancomycin (VANCOREADY) IVPB 1500 mg/300 mL  Status:  Discontinued         1,500 mg 150 mL/hr over 120 Minutes Intravenous Every 24 hours 07/27/20 1253 07/27/20 1337   07/27/20 2000  ceFEPIme (MAXIPIME) 2 g in sodium chloride 0.9 % 100 mL IVPB  Status:  Discontinued        2 g 200 mL/hr over 30 Minutes Intravenous Every 8 hours 07/27/20 1248 07/27/20 1337   07/27/20 1100  vancomycin (VANCOCIN) IVPB 1000 mg/200 mL premix  Status:  Discontinued        1,000 mg 200 mL/hr over 60 Minutes Intravenous  Once 07/27/20 1056 07/27/20 1057   07/27/20 1100  ceFEPIme (MAXIPIME) 2 g in sodium chloride 0.9 % 100 mL IVPB        2 g 200 mL/hr over 30 Minutes Intravenous  Once 07/27/20 1056 07/27/20 1302   07/27/20 1100  vancomycin (VANCOREADY) IVPB 2000 mg/400 mL        2,000 mg 200 mL/hr over 120 Minutes Intravenous  Once 07/27/20 1057 07/27/20 1459     Subjective: Patient says he has no appetite.  Objective: Vitals:   07/29/20 0600 07/29/20 0700 07/29/20 0800 07/29/20 0900  BP: (!) 127/94 135/75 124/78 121/83  Pulse: 99 (!) 32 97 76  Resp: (!) 24 16 19  (!) 22  Temp:  98.1 F (36.7 C)    TempSrc:  Axillary    SpO2: 96% 99% 97% 99%  Weight:      Height:        Intake/Output Summary (Last 24 hours) at 07/29/2020 1125 Last data filed at 07/29/2020 1448 Gross per 24 hour  Intake 415.5 ml  Output 350 ml  Net 65.5 ml   Filed Weights   07/27/20 1048 07/28/20 0252  Weight: 95.5 kg 93.1 kg    Examination:  General exam: Appears calm and comfortable,NAD Respiratory system: Clear to auscultation. Respiratory effort normal.  Currently on room air. Cardiovascular system: normal S1 & S2 heard, irregular heart rate Gastrointestinal system: Abdomen is nondistended, soft and nontender.  Central nervous system: nonfocal exam.  Extremities: No significant edema Skin: No rashes, lesions or ulcers Psychiatry: Flat affect  Data Reviewed: I have personally reviewed following labs and imaging studies  CBC: Recent Labs  Lab 07/27/20 1145 07/28/20 0452 07/29/20 0401   WBC 8.4 6.9 8.1  NEUTROABS 6.6  --   --   HGB 13.6 13.0 13.0  HCT 41.4 40.7 42.1  MCV 90.6 91.9 91.9  PLT 133* 120* 185*   Basic Metabolic Panel: Recent Labs  Lab 07/23/20 0420 07/27/20 0804 07/27/20 1145 07/28/20 0452 07/29/20 0401  NA 133* 138 137 140 139  K 3.4* 3.5 3.6 3.0* 3.5  CL 93* 95* 94* 95* 98  CO2 32 33* 32 35* 32  GLUCOSE 110* 104* 110* 98 101*  BUN 20 29* 27* 22 27*  CREATININE 0.62 0.88 0.86 0.67 0.81  CALCIUM 8.7* 8.4* 8.6* 8.9 8.4*  MG  --   --   --  1.3* 1.8   GFR: Estimated Creatinine Clearance: 76.5 mL/min (by C-G formula based on SCr of 0.81 mg/dL). Liver Function Tests: Recent Labs  Lab 07/23/20 0420 07/27/20 1145  AST 20 31  ALT 21 28  ALKPHOS 81 83  BILITOT 0.7 1.0  PROT 5.4* 5.8*  ALBUMIN 3.2* 3.4*   No results for input(s): LIPASE, AMYLASE in the last 168 hours. No results for input(s): AMMONIA in the last 168 hours. Coagulation Profile: Recent Labs  Lab 07/27/20 1145  INR 1.9*   Cardiac Enzymes: No results for input(s): CKTOTAL, CKMB, CKMBINDEX, TROPONINI in the last 168 hours. BNP (last 3 results) No results for input(s): PROBNP in the last 8760 hours. HbA1C: No results for input(s): HGBA1C in the last 72 hours. CBG: Recent Labs  Lab 07/28/20 1208 07/28/20 1619 07/28/20 2140 07/29/20 0723 07/29/20 1103  GLUCAP 100* 109* 98 274* 60*   Lipid Profile: No results for input(s): CHOL, HDL, LDLCALC, TRIG, CHOLHDL, LDLDIRECT in the last 72 hours. Thyroid Function Tests: Recent Labs    07/27/20 1420  TSH 2.324   Anemia Panel: No results for input(s): VITAMINB12, FOLATE, FERRITIN, TIBC, IRON, RETICCTPCT in the last 72 hours. Sepsis Labs: Recent Labs  Lab 07/27/20 1145 07/27/20 1420 07/29/20 0401  PROCALCITON  --  <0.10  --   LATICACIDVEN 1.5 1.9 1.3    Recent Results (from the past 240 hour(s))  Urine culture     Status: None   Collection Time: 07/27/20 10:57 AM   Specimen: In/Out Cath Urine  Result Value Ref  Range Status   Specimen Description   Final    IN/OUT CATH URINE Performed at [facility], [address] [place], [place] [address]    Special Requests   Final    NONE Performed at [facility], [address], [place], [place] [address]    Culture   Final    NO GROWTH Performed at [facility], [place] [address] [place], [place] [address]    Report Status 07/28/2020 FINAL  Final  Respiratory Panel by RT PCR (Flu A&B, Covid) - Nasopharyngeal Swab     Status: None   Collection Time: 07/27/20 11:00 AM   Specimen: Nasopharyngeal Swab  Result Value Ref Range Status   SARS Coronavirus 2 by RT PCR NEGATIVE NEGATIVE Final    Comment: (NOTE) SARS-CoV-2 target nucleic acids are NOT DETECTED.  The SARS-CoV-2 RNA is generally detectable in upper respiratoy specimens during the acute phase of infection. The lowest concentration of SARS-CoV-2 viral copies this assay can detect is 131 copies/mL. A negative result does not preclude SARS-Cov-2 infection and should not be used as the sole basis for treatment or other patient management decisions. A negative result may occur with  improper specimen collection/handling, submission of specimen other than nasopharyngeal swab, presence of viral mutation(s) within the areas targeted by this assay, and inadequate number of viral copies (<131 copies/mL). A negative result must be combined with clinical observations, patient history, and epidemiological information. The expected result is Negative.  Fact Sheet for Patients:  PinkCheek.be  Fact Sheet for Healthcare Providers:  GravelBags.it  This test is no t yet approved or cleared by the [place] FDA and  has been authorized for detection and/or diagnosis of SARS-CoV-2 by FDA under an Emergency Use Authorization (EUA). This EUA will remain  in effect (meaning this test can be used) for the duration of the COVID-19 declaration  under Section 564(b)(1) of the Act, 21 U.S.C. section 360bbb-3(b)(1), unless the authorization is terminated or revoked sooner.     Influenza A by PCR NEGATIVE NEGATIVE Final   Influenza B by PCR NEGATIVE NEGATIVE Final    Comment: (NOTE) The Xpert Xpress SARS-CoV-2/FLU/RSV  assay is intended as an aid in  the diagnosis of influenza from Nasopharyngeal swab specimens and  should not be used as a sole basis for treatment. Nasal washings and  aspirates are unacceptable for Xpert Xpress SARS-CoV-2/FLU/RSV  testing.  Fact Sheet for Patients: PinkCheek.be  Fact Sheet for Healthcare Providers: GravelBags.it  This test is not yet approved or cleared by the [place] FDA and  has been authorized for detection and/or diagnosis of SARS-CoV-2 by  FDA under an Emergency Use Authorization (EUA). This EUA will remain  in effect (meaning this test can be used) for the duration of the  Covid-19 declaration under Section 564(b)(1) of the Act, 21  U.S.C. section 360bbb-3(b)(1), unless the authorization is  terminated or revoked. Performed at [facility], [address], [place], [place] [address]   Blood Culture (routine x 2)     Status: None (Preliminary result)   Collection Time: 07/27/20 11:44 AM   Specimen: BLOOD LEFT FOREARM  Result Value Ref Range Status   Specimen Description BLOOD LEFT FOREARM  Final   Special Requests   Final    BOTTLES DRAWN AEROBIC AND ANAEROBIC Blood Culture adequate volume   Culture   Final    NO GROWTH 2 DAYS Performed at [facility], [address], [place], [place] [address]    Report Status PENDING  Incomplete  Blood Culture (routine x 2)     Status: None (Preliminary result)   Collection Time: 07/27/20 11:52 AM   Specimen: BLOOD RIGHT FOREARM  Result Value Ref Range Status   Specimen Description BLOOD RIGHT FOREARM  Final   Special Requests   Final    BOTTLES DRAWN AEROBIC AND ANAEROBIC Blood  Culture adequate volume   Culture   Final    NO GROWTH 2 DAYS Performed at [facility], [address], [place], [place] [address]    Report Status PENDING  Incomplete  MRSA PCR Screening     Status: Abnormal   Collection Time: 07/27/20 12:54 PM   Specimen: Nasopharyngeal  Result Value Ref Range Status   MRSA by PCR POSITIVE (A) NEGATIVE Final    Comment:        The GeneXpert MRSA Assay (FDA approved for NASAL specimens only), is one component of a comprehensive MRSA colonization surveillance program. It is not intended to diagnose MRSA infection nor to guide or monitor treatment for MRSA infections. RESULT CALLED TO, READ BACK BY AND VERIFIED WITH: HILTON, LAWERENCE @0742  ON 07/28/20 BY JONES,T Performed at [facility], [address], [place], [place] [address]   Gram stain     Status: None   Collection Time: 07/28/20 11:25 AM   Specimen: Pleura  Result Value Ref Range Status   Specimen Description PLEURAL  Final   Special Requests NONE  Final   Gram Stain   Final    CYTOSPIN SMEAR NO ORGANISMS SEEN WBC PRESENT,BOTH PMN AND MONONUCLEAR Performed at [facility], [address], [place], [place] [address]    Report Status 07/28/2020 FINAL  Final  Culture, body fluid-bottle     Status: None (Preliminary result)   Collection Time: 07/28/20 11:25 AM   Specimen: Pleura  Result Value Ref Range Status   Specimen Description PLEURAL  Final   Special Requests 10CC  Final   Culture   Final    NO GROWTH < 24 HOURS Performed at [facility], [address] [place],  [address]    Report Status PENDING  Incomplete    Radiology Studies:  DG Chest Port 1 View  Result Date: 07/28/2020 CLINICAL DATA:  RIGHT pleural effusion post thoracentesis EXAM: PORTABLE CHEST 1 VIEW COMPARISON:  Portable exam 1141 hours compared to 07/27/2020 FINDINGS: Enlargement of cardiac silhouette. Atherosclerotic calcification aorta. Bibasilar pleural effusions and atelectasis decreased on RIGHT  since previous exam. No pneumothorax or new infiltrate seen. IMPRESSION: No pneumothorax following thoracentesis. Residual bibasilar pleural effusions and atelectasis. Electronically Signed   By: Lavonia Dana M.D.   On: 07/28/2020 11:50   ECHOCARDIOGRAM LIMITED  Result Date: 07/27/2020    ECHOCARDIOGRAM LIMITED REPORT   Patient Name:   Eric Bowman Date of Exam: 07/27/2020 Medical Rec #:  [id number]     Height:       69.0 in Accession #:    [id number]    Weight:       210.6 lb Date of Birth:  September 17, 1933    BSA:          2.112 m Patient Age:    27 years      BP:           104/89 mmHg Patient Gender: M             HR:           107 bpm. Exam Location:  [facility] Procedure: Limited Echo, Cardiac Doppler and Limited Color Doppler Indications:    Atrial Fibrillation 427.31 / I48.91  History:        Patient has prior history of Echocardiogram examinations, most                 recent 04/10/2020. Risk Factors:Hypertension, Dyslipidemia and                 Former Smoker. History of Covid 19, Radiculopathy of cervical                 spine.  Sonographer:    Alvino Chapel RCS Referring Phys: [id number] [place] D Page  1. Left ventricular ejection fraction, by estimation, is 60 to 65%. The left ventricle has normal function. There is mild left ventricular hypertrophy.  2. Right ventricular systolic function is mildly reduced. The right ventricular size is moderately enlarged.  3. Left atrial size was moderately dilated.  4. Right atrial size was moderately dilated.  5. The pericardial effusion is circumferential.  6. The aortic valve was not well visualized.  7. Limited echo in the setting of new onset atrial fibrillation FINDINGS  Left Ventricle: Left ventricular ejection fraction, by estimation, is 60 to 65%. The left ventricle has normal function. The left ventricular internal cavity size was normal in size. There is mild left ventricular hypertrophy. Right Ventricle: The right ventricular size is moderately  enlarged. Right ventricular systolic function is mildly reduced. Left Atrium: Left atrial size was moderately dilated. Right Atrium: Right atrial size was moderately dilated. Pericardium: Trivial pericardial effusion is present. The pericardial effusion is circumferential. Aortic Valve: The aortic valve was not well visualized. Aorta: The aortic root is normal in size and structure. LEFT VENTRICLE PLAX 2D LVIDd:         4.35 cm LVIDs:         2.87 cm LV PW:         1.08 cm LV IVS:        1.21 cm LVOT diam:     2.10 cm LVOT Area:     3.46 cm  RIGHT VENTRICLE TAPSE (M-mode): 1.4 cm LEFT ATRIUM  Index      RIGHT ATRIUM           Index LA diam:    4.25 cm 2.01 cm/m RA Area:     32.90 cm                                RA Volume:   122.00 ml 57.77 ml/m   AORTA Ao Root diam: 3.40 cm TRICUSPID VALVE TR Peak grad:   32.5 mmHg TR Vmax:        285.00 cm/s  SHUNTS Systemic Diam: 2.10 cm Carlyle Dolly MD Electronically signed by Carlyle Dolly MD Signature Date/Time: 07/27/2020/4:48:15 PM    Final    US THORACENTESIS ASP PLEURAL SPACE W/IMG GUIDE  Result Date: 07/28/2020 INDICATION: RIGHT pleural effusion EXAM: ULTRASOUND GUIDED DIAGNOSTIC AND THERAPEUTIC THORACENTESIS MEDICATIONS: None. COMPLICATIONS: None immediate. PROCEDURE: Patient was unable to provide written informed consent. Patient's wife was not available to provide consent, though she was aware of the need for the procedure. A note of medical necessity for the procedure was placed on the chart by the provider. Time-out protocol was utilized. Ultrasound was performed to localize and mark an adequate pocket of fluid in the RIGHT chest. The area was then prepped and draped in the normal sterile fashion. 1% Lidocaine was used for local anesthesia. Under ultrasound guidance a 8 French thoracentesis catheter was introduced. Thoracentesis was performed. The catheter was removed and a dressing applied. FINDINGS: A total of approximately 1280 mL of amber  colored RIGHT pleural fluid was removed. Samples were sent to the laboratory as requested by the clinical team. IMPRESSION: Successful ultrasound guided RIGHT thoracentesis yielding 1280 mL of pleural fluid. Electronically Signed   By: Lavonia Dana M.D.   On: 07/28/2020 11:58   Scheduled Meds: . allopurinol  300 mg Oral Daily  . apixaban  5 mg Oral BID  . atorvastatin  20 mg Oral QPM  . Chlorhexidine Gluconate Cloth  6 each Topical Daily  . Chlorhexidine Gluconate Cloth  6 each Topical Q0600  . donepezil  10 mg Oral QHS  . gabapentin  100 mg Oral QHS  . insulin aspart  0-5 Units Subcutaneous QHS  . insulin aspart  0-9 Units Subcutaneous TID WC  . metoprolol tartrate  25 mg Oral Q6H  . midodrine  5 mg Oral TID WC  . mupirocin ointment  1 application Nasal BID  . pantoprazole  40 mg Oral Daily  . potassium chloride  40 mEq Oral BID  . sodium chloride flush  3 mL Intravenous Q12H   Continuous Infusions: . sodium chloride    . diltiazem (CARDIZEM) infusion 5 mg/hr (07/28/20 0901)     LOS: 2 days   Critical Care Procedure Note Authorized and Performed by: Murvin Natal MD  Total Critical Care time:  33 minutes Due to a high probability of clinically significant, life threatening deterioration, the patient required my highest level of preparedness to intervene emergently and I personally spent this critical care time directly and personally managing the patient.  This critical care time included obtaining a history; examining the patient, pulse oximetry; ordering and review of studies; arranging urgent treatment with development of a management plan; evaluation of patient's response of treatment; frequent reassessment; and discussions with other providers.  This critical care time was performed to assess and manage the high probability of imminent and life threatening deterioration that could result in multi-organ failure.  It  was exclusive of separately billable procedures and treating other  patients and teaching time.   Irwin Brakeman, MD How to contact the [facility] Attending or Consulting provider [place] or covering provider during after hours [place], for this patient?  1. Check the care team in [facility] and look for a) attending/consulting [facility] provider listed and b) the [facility] team listed 2. Log into [email or web address] and use [place] universal password to access. If you do not have the password, please contact the hospital operator. 3. Locate the [facility] provider you are looking for under [facility] and page to a number that you can be directly reached. 4. If you still have difficulty reaching the provider, please page the [facility] (Director on Call) for the Hospitalists listed on amion for assistance.  [facility]  If 7PM-7AM, please contact night-coverage [email or web address] 07/29/2020, 11:25 AM

## 2020-07-29 NOTE — Evaluation (Signed)
Clinical/Bedside Swallow Evaluation Patient Details  Name: Eric Bowman MRN: [id number] Date of Birth: Apr 10, 1933  Today's Date: 07/29/2020 Time: SLP Start Time (ACUTE ONLY): 1252 SLP Stop Time (ACUTE ONLY): 1315 SLP Time Calculation (min) (ACUTE ONLY): 23 min  Past Medical History:  Past Medical History:  Diagnosis Date  . Cancer (HCC)    melanoma-left side  . Chronic pain    legs and feet  . Coronary artery disease   . Dementia ([place])   . Diabetes mellitus without complication ([place])   . Foot drop   . GERD (gastroesophageal reflux disease)   . Gout   . Hyperlipidemia   . Hypertension   . OCD (obsessive compulsive disorder)   . PONV (postoperative nausea and vomiting)   . Psychosis ([place])   . Reflux   . Sarcoidosis   . Sleep apnea    cpap   Past Surgical History:  Past Surgical History:  Procedure Laterality Date  . BACK SURGERY     cervical neck  . CATARACT EXTRACTION W/PHACO  01/05/2012   Procedure: CATARACT EXTRACTION PHACO AND INTRAOCULAR LENS PLACEMENT (IOC);  Surgeon: [name] Branch, MD;  Location: [facility];  Service: Ophthalmology;  Laterality: Left;  CDE 18.47  . CATARACT EXTRACTION W/PHACO  02/02/2012   Procedure: CATARACT EXTRACTION PHACO AND INTRAOCULAR LENS PLACEMENT (IOC);  Surgeon: [name] Branch, MD;  Location: [facility];  Service: Ophthalmology;  Laterality: Right;  CDE:15.38  . CHOLECYSTECTOMY    . GALLBLADDER SURGERY    . hemorhoidectomy    . MELANOMA EXCISION     left side-[name]  . TONSILLECTOMY     HPI:  Eric Bowman is a 86 y.o. male with medical history significant for dementia, cervical myelopathy with inability to ambulate, CAD, OSA, gout, hyperlipidemia, hypertension, and GERD who was brought from [facility] with some hypoxemia as well as tachycardia and weakness.  His heart rate was approximately 140 bpm and he was noted to be in atrial fibrillation which was new for him.  He was also noted to be tachypneic and has not a known moderate-sized pleural  effusion from ultrasound on 9/20, but thoracentesis could not be performed at that time since he could not sit upright.  He was suspected to have pneumonia a few days prior on 9/22 for which he was started on Levaquin intravenously for treatment. Right-sided pleural effusion status post thoracentesis on 9/28. BSE requested 07/29/20.   Assessment / Plan / Recommendation Clinical Impression  Clinical swallowing evaluation completed while Pt was sitting upright in bed. Pt was in a state of somnolence with bed reclined to a 45 degree angle; upon SLP entering the room wife was present and attempting to feed the patient, despite his being reclined and NOT alert. Pt had a ziti noodle in his mouth which SLP removed. Pt's wife then reported he is on a puree diet at [facility] where he has been a resident for the last 14 months. Pt was roused with max cues for limited trials; Pt demonstrated inconsistent immediate coughing with initial trials of ice chips and sips of water and inconsistent throat clearing and delayed coughing with thin liquid trials. Pt consumed apple sauce trials without incident. Question if dysphagia is primarily related to cognition and alertness or if this is a persistent problem. Note Pt does have an increased risk of aspiration secondary to the following risk factors: hx of PNA, lethargy, cognitive impairment, deconditioning and decreased respiratory status.   Recommend downgrade diet to D1/puree and NTL; ST will  follow acutely to determine need for objective swallowing assessment vs subjective upgrade. Please note, if pt is not alert he should not receive any PO medication, food or liquid. Also please ensure pt is sitting upright at a 90 degree angle for all PO. Thank you for this consult.   SLP Visit Diagnosis: Dysphagia, unspecified (R13.10)    Aspiration Risk  Mild aspiration risk;Moderate aspiration risk    Diet Recommendation Dysphagia 1 (Puree);Nectar-thick liquid   Liquid Administration  via: Cup Medication Administration: Crushed with puree Supervision: Full supervision/cueing for compensatory strategies Compensations: Minimize environmental distractions;Slow rate Postural Changes: Seated upright at 90 degrees;Remain upright for at least 30 minutes after po intake    Other  Recommendations Oral Care Recommendations: Oral care BID   Follow up Recommendations Skilled Nursing facility;24 hour supervision/assistance      Frequency and Duration min 1 x/week  1 week       Prognosis Prognosis for Safe Diet Advancement: Fair Barriers to Reach Goals: Cognitive deficits      Swallow Study   General Date of Onset: 07/27/20 HPI: Eric Bowman is a 86 y.o. male with medical history significant for dementia, cervical myelopathy with inability to ambulate, CAD, OSA, gout, hyperlipidemia, hypertension, and GERD who was brought from [facility] with some hypoxemia as well as tachycardia and weakness.  His heart rate was approximately 140 bpm and he was noted to be in atrial fibrillation which was new for him.  He was also noted to be tachypneic and has not a known moderate-sized pleural effusion from ultrasound on 9/20, but thoracentesis could not be performed at that time since he could not sit upright.  He was suspected to have pneumonia a few days prior on 9/22 for which he was started on Levaquin intravenously for treatment. Right-sided pleural effusion status post thoracentesis on 9/28. BSE requested 07/29/20. Type of Study: Bedside Swallow Evaluation Previous Swallow Assessment: none in chart Diet Prior to this Study: Regular;Thin liquids Temperature Spikes Noted: No Respiratory Status: Nasal cannula History of Recent Intubation: No Behavior/Cognition: Alert;Cooperative;Pleasant mood Oral Cavity Assessment: Dry Oral Care Completed by SLP: Recent completion by staff Oral Cavity - Dentition: Edentulous Self-Feeding Abilities: Total assist Patient Positioning: Upright in  bed Baseline Vocal Quality: Normal;Low vocal intensity Volitional Cough: Cognitively unable to elicit Volitional Swallow: Unable to elicit    Oral/Motor/Sensory Function Overall Oral Motor/Sensory Function: Within functional limits   Ice Chips Ice chips: Impaired Presentation: Spoon Oral Phase Impairments: Poor awareness of bolus Oral Phase Functional Implications: Prolonged oral transit Pharyngeal Phase Impairments: Cough - Immediate;Throat Clearing - Delayed   Thin Liquid Thin Liquid: Impaired Presentation: Cup;Spoon;Straw Oral Phase Impairments: Poor awareness of bolus Pharyngeal  Phase Impairments: Multiple swallows;Wet Vocal Quality;Cough - Immediate;Cough - Delayed    Nectar Thick Nectar Thick Liquid: Not tested   Honey Thick Honey Thick Liquid: Not tested   Puree Puree: Within functional limits   Solid     Solid: Not tested     Calyn Rubi H. Roddie Mc, CCC-SLP Speech Language Pathologist  Wende Bushy 07/29/2020,2:43 PM

## 2020-07-29 NOTE — Progress Notes (Signed)
Patient currently resting well without CPAP and Rt will continue to monitor patient.

## 2020-07-29 NOTE — Progress Notes (Signed)
Hypoglycemic Event  CBG: 60  Treatment: 1 amp D50 administered  Symptoms: none  Follow-up CBG: Time:1200 CBG Result:120  Possible Reasons for Event: pt drowsy and did not eat any breakfast.   Comments/MD notified:Dr. Sheryle Spray

## 2020-07-30 LAB — BASIC METABOLIC PANEL
Anion gap: 8 (ref 5–15)
BUN: 23 mg/dL (ref 8–23)
CO2: 31 mmol/L (ref 22–32)
Calcium: 8.5 mg/dL — ABNORMAL LOW (ref 8.9–10.3)
Chloride: 100 mmol/L (ref 98–111)
Creatinine, Ser: 0.66 mg/dL (ref 0.61–1.24)
GFR calc Af Amer: >60 mL/min (ref >60)
GFR calc non Af Amer: >60 mL/min (ref >60)
Glucose, Bld: 100 mg/dL — ABNORMAL HIGH (ref 70–99)
Potassium: 4 mmol/L (ref 3.5–5.1)
Sodium: 139 mmol/L (ref 135–145)

## 2020-07-30 LAB — CBC
HCT: 45.9 % (ref 39.0–52.0)
Hemoglobin: 13.9 g/dL (ref 13.0–17.0)
MCH: 28.4 pg (ref 26.0–34.0)
MCHC: 30.3 g/dL (ref 30.0–36.0)
MCV: 93.9 fL (ref 80.0–100.0)
Platelets: 142 10*3/uL — ABNORMAL LOW (ref 150–400)
RBC: 4.89 MIL/uL (ref 4.22–5.81)
RDW: 15.9 % — ABNORMAL HIGH (ref 11.5–15.5)
WBC: 8 10*3/uL (ref 4.0–10.5)
nRBC: 0 % (ref 0.0–0.2)

## 2020-07-30 LAB — PATHOLOGIST SMEAR REVIEW

## 2020-07-30 LAB — GLUCOSE, CAPILLARY
Glucose-Capillary: 90 mg/dL (ref 70–99)
Glucose-Capillary: 158 mg/dL — ABNORMAL HIGH (ref 70–99)
Glucose-Capillary: 138 mg/dL — ABNORMAL HIGH (ref 70–99)
Glucose-Capillary: 125 mg/dL — ABNORMAL HIGH (ref 70–99)

## 2020-07-30 LAB — MAGNESIUM: Magnesium: 1.8 mg/dL (ref 1.7–2.4)

## 2020-07-30 MED ORDER — MAGNESIUM SULFATE 2 GM/50ML IV SOLN
2.0000 g | Freq: Once | INTRAVENOUS | Status: AC
  Administered 2020-07-30: 2 g via INTRAVENOUS
  Filled 2020-07-30: qty 50

## 2020-07-30 MED ORDER — METOPROLOL SUCCINATE ER 50 MG PO TB24
50.0000 mg | ORAL_TABLET | Freq: Every day | ORAL | Status: DC
  Administered 2020-07-30: 50 mg via ORAL
  Filled 2020-07-30 (×2): qty 1

## 2020-07-30 MED ORDER — POTASSIUM CHLORIDE CRYS ER 20 MEQ PO TBCR
40.0000 meq | EXTENDED_RELEASE_TABLET | Freq: Every day | ORAL | Status: DC
  Administered 2020-07-31 – 2020-08-01 (×2): 40 meq via ORAL
  Filled 2020-07-30 (×2): qty 2

## 2020-07-30 MED ORDER — METOPROLOL TARTRATE 5 MG/5ML IV SOLN: 2.5000 mg | Freq: Four times a day (QID) | INTRAVENOUS | Status: DC | PRN

## 2020-07-30 NOTE — Progress Notes (Signed)
  Speech Language Pathology Treatment: Dysphagia  Patient Details Name: Eric Bowman MRN: [id number] DOB: 13-Dec-1932 Today's Date: 07/30/2020 Time: 8032-1224 SLP Time Calculation (min) (ACUTE ONLY): 20 min  Assessment / Plan / Recommendation Clinical Impression  Ongoing diagnostic dysphagia therapy provided while Pt ws sitting upright in bed; Pt with improved alertness today. Pt consumed thin liquids and demonstrated, again, inconsistent immediate congested sounding coughing and a delayed throat clear. Pt also consumed 3 tsp bites of mech soft fruit (drained from a fruit cup) with continued immediate and delayed coughing and throat clearing. Recommend continue with D1/puree diet and NECTAR thick liquids, recommend proceed with MBSS to objectively assess the swallowing function, tomorrow as schedule permits.    HPI HPI: Eric Bowman is a 86 y.o. male with medical history significant for dementia, cervical myelopathy with inability to ambulate, CAD, OSA, gout, hyperlipidemia, hypertension, and GERD who was brought from [facility] with some hypoxemia as well as tachycardia and weakness.  His heart rate was approximately 140 bpm and he was noted to be in atrial fibrillation which was new for him.  He was also noted to be tachypneic and has not a known moderate-sized pleural effusion from ultrasound on 9/20, but thoracentesis could not be performed at that time since he could not sit upright.  He was suspected to have pneumonia a few days prior on 9/22 for which he was started on Levaquin intravenously for treatment. Right-sided pleural effusion status post thoracentesis on 9/28. BSE requested 07/29/20.      SLP Plan  Continue with current plan of care;MBS       Recommendations  Diet recommendations: Dysphagia 1 (puree);Nectar-thick liquid Liquids provided via: Cup Medication Administration: Crushed with puree Supervision: Full supervision/cueing for compensatory strategies Compensations:  Minimize environmental distractions;Slow rate Postural Changes and/or Swallow Maneuvers: Seated upright 90 degrees;Upright 30-60 min after meal                Oral Care Recommendations: Oral care BID Follow up Recommendations: Skilled Nursing facility;24 hour supervision/assistance SLP Visit Diagnosis: Dysphagia, unspecified (R13.10) Plan: Continue with current plan of care;MBS       [name] H. Roddie Mc, CCC-SLP Speech Language Pathologist   Wende Bushy 07/30/2020, 1:13 PM

## 2020-07-30 NOTE — Plan of Care (Signed)
  Problem: Acute Rehab PT Goals(only PT should resolve) Goal: Pt will Roll Supine to Side Outcome: Progressing Flowsheets (Taken 07/30/2020 1503) Pt will Roll Supine to Side: with mod assist Goal: Pt Will Go Supine/Side To Sit Outcome: Progressing Flowsheets (Taken 07/30/2020 1503) Pt will go Supine/Side to Sit: with maximum assist Goal: Pt Will Go Sit To Supine/Side Outcome: Progressing Flowsheets (Taken 07/30/2020 1503) Pt will go Sit to Supine/Side: with maximum assist Goal: Patient Will Perform Sitting Balance Outcome: Progressing Flowsheets (Taken 07/30/2020 1503) Patient will perform sitting balance:  1-2 min  with maximum assist  with bilateral UE support   Pamala Hurry D. Hartnett-Rands, MS, PT Per [place] [phone] 07/30/2020

## 2020-07-30 NOTE — Plan of Care (Signed)
  Problem: SLP Dysphagia Goals Goal: Patient will utilize recommended strategies Description: Patient will utilize recommended strategies during swallow to increase swallowing safety with Flowsheets (Taken 07/30/2020 1320) Patient will utilize recommended strategies during swallow to increase swallowing safety with: max assist   Tesneem Dufrane H. Roddie Mc, [place] Speech Language Pathologist

## 2020-07-30 NOTE — Progress Notes (Signed)
PROGRESS NOTE    Eric Bowman  ZOX:[id number] DOB: [birth year] DOA: 07/27/2020 PCP: Gerlene Fee, NP   Brief Narrative:  Per HPI: Eric Bowman is a 86 y.o. male with medical history significant for dementia, cervical myelopathy with inability to ambulate, CAD, OSA, gout, hyperlipidemia, hypertension, and GERD who was brought from [facility] with some hypoxemia as well as tachycardia and weakness.  His heart rate was approximately 140 bpm and he was noted to be in atrial fibrillation which was new for him.  He was also noted to be tachypneic and has not a known moderate-sized pleural effusion from ultrasound on 9/20, but thoracentesis could not be performed at that time since he could not sit upright.  He was suspected to have pneumonia a few days prior on 9/22 for which he was started on Levaquin intravenously for treatment.  9/28: Patient has been admitted with new onset atrial fibrillation with RVR and remains on Cardizem drip this morning.  Cardiology planning to wean off drip with use of metoprolol.  He has undergone right-sided thoracentesis with 1280 mL removed.  Plans to hold Lasix today given softer blood pressure readings with close monitoring.  9/29: HR better controlled.  Poor appetite with hypoglycemia.   9/30: Pt not eating well but HR is better controlled.   Assessment & Plan:   Active Problems:   Pleural effusion   Atrial fibrillation with RVR (HCC)   S/P thoracentesis   New onset atrial fibrillation with RVR-improving -2D echocardiogram 03/2020 with LVEF 55-60% -Limited 2D echocardiogram with LVEF 60-65% with mild RV dysfunction and moderate biatrial enlargement -TSH 2.3 -Maintain on Cardizem drip for rate control and wean with addition of metoprolol per cardiology on 9/28,  -transitioned to toprol XL 9/30  -Continue anticoagulation with Eliquis (unknown as to why patient is on this medication) -Appreciate cardiology evaluation and assistance with rate  control  Right-sided pleural effusion status post thoracentesis on 9/28 -1280 mL fluid removed per radiology  History of hypertension currently with hypotension -Hold further diuresis as well as home lisinopril -Continue to wean Cardizem and transition to metoprolol per cardiology -May need to trial amiodarone if he continues to remain hypotensive  History of CAD with prior stent -Continue aspirin and statin -No chest pain or signs of ACS noted  History of COPD/pulmonary sarcoid -Breathing treatments with Xopenex as needed with no acute bronchospasms currently noted  History of diabetes -No significant hyperglycemia noted, will try SSI for coverage and carb modified diet -Hemoglobin A1c on 05/2020 5.5%  CBG (last 3)  Recent Labs    07/29/20 2117 07/30/20 0726 07/30/20 1121  GLUCAP 96 90 158*    Hypoglycemia - due to poor oral intake, treated with dextrose  History of dementia -Continue Aricept  History of gout -Continue allopurinol -No acute issues currently  Dyslipidemia -Continue statin  OSA with noncompliance on CPAP -Order CPAP at bedtime  GERD -PPI  Cervical myelopathy -Wheelchair-bound  DVT prophylaxis:Eliquis Code Status: DNR Family Communication: Discussed with wife on phone 9/28, 9/30 no answer Disposition Plan:  Status is: Inpatient  Remains inpatient appropriate because:Hemodynamically unstable, IV treatments appropriate due to intensity of illness or inability to take PO and Inpatient level of care appropriate due to severity of illness  Dispo: The patient is from: SNF              Anticipated d/c is to: SNF              Anticipated d/c date is:  2 days              Patient currently is not medically stable to d/c.  Patient requires ongoing rate control and stabilization per cardiology prior to discharge.  Consultants:   Cardiology  Procedures:   See below  Antimicrobials:  Anti-infectives (From admission, onward)   Start      Dose/Rate Route Frequency Ordered Stop   07/28/20 1200  vancomycin (VANCOREADY) IVPB 1500 mg/300 mL  Status:  Discontinued        1,500 mg 150 mL/hr over 120 Minutes Intravenous Every 24 hours 07/27/20 1253 07/27/20 1337   07/27/20 2000  ceFEPIme (MAXIPIME) 2 g in sodium chloride 0.9 % 100 mL IVPB  Status:  Discontinued        2 g 200 mL/hr over 30 Minutes Intravenous Every 8 hours 07/27/20 1248 07/27/20 1337   07/27/20 1100  vancomycin (VANCOCIN) IVPB 1000 mg/200 mL premix  Status:  Discontinued        1,000 mg 200 mL/hr over 60 Minutes Intravenous  Once 07/27/20 1056 07/27/20 1057   07/27/20 1100  ceFEPIme (MAXIPIME) 2 g in sodium chloride 0.9 % 100 mL IVPB        2 g 200 mL/hr over 30 Minutes Intravenous  Once 07/27/20 1056 07/27/20 1302   07/27/20 1100  vancomycin (VANCOREADY) IVPB 2000 mg/400 mL        2,000 mg 200 mL/hr over 120 Minutes Intravenous  Once 07/27/20 1057 07/27/20 1459     Subjective: Patient says he is tired today.  No chest pain, no palpitations.   Objective: Vitals:   07/30/20 1100 07/30/20 1200 07/30/20 1300 07/30/20 1400  BP: 100/65 109/79 101/60 117/83  Pulse: 94 94 76 96  Resp: (!) 23 14 17 14   Temp:  97.7 F (36.5 C)    TempSrc:  Oral    SpO2: 97% 96% 98% 97%  Weight:      Height:        Intake/Output Summary (Last 24 hours) at 07/30/2020 1504 Last data filed at 07/30/2020 0701 Gross per 24 hour  Intake 28.04 ml  Output --  Net 28.04 ml   Filed Weights   07/27/20 1048 07/28/20 0252 07/30/20 0500  Weight: 95.5 kg 93.1 kg 93.1 kg    Examination:  General exam: Appears calm and comfortable,NAD Respiratory system: Clear to auscultation. Respiratory effort normal.  Currently on room air. Cardiovascular system: normal S1 & S2 heard, irregular heart rate Gastrointestinal system: Abdomen is nondistended, soft and nontender.  Central nervous system: nonfocal exam.  Extremities: No significant edema Skin: No rashes, lesions or  ulcers Psychiatry: Flat affect  Data Reviewed: I have personally reviewed following labs and imaging studies  CBC: Recent Labs  Lab 07/27/20 1145 07/28/20 0452 07/29/20 0401 07/30/20 0758  WBC 8.4 6.9 8.1 8.0  NEUTROABS 6.6  --   --   --   HGB 13.6 13.0 13.0 13.9  HCT 41.4 40.7 42.1 45.9  MCV 90.6 91.9 91.9 93.9  PLT 133* 120* 145* 272*   Basic Metabolic Panel: Recent Labs  Lab 07/27/20 0804 07/27/20 1145 07/28/20 0452 07/29/20 0401 07/30/20 0758  NA 138 137 140 139 139  K 3.5 3.6 3.0* 3.5 4.0  CL 95* 94* 95* 98 100  CO2 33* 32 35* 32 31  GLUCOSE 104* 110* 98 101* 100*  BUN 29* 27* 22 27* 23  CREATININE 0.88 0.86 0.67 0.81 0.66  CALCIUM 8.4* 8.6* 8.9 8.4* 8.5*  MG  --   --  1.3* 1.8 1.8   GFR: Estimated Creatinine Clearance: 77.5 mL/min (by C-G formula based on SCr of 0.66 mg/dL). Liver Function Tests: Recent Labs  Lab 07/27/20 1145  AST 31  ALT 28  ALKPHOS 83  BILITOT 1.0  PROT 5.8*  ALBUMIN 3.4*   No results for input(s): LIPASE, AMYLASE in the last 168 hours. No results for input(s): AMMONIA in the last 168 hours. Coagulation Profile: Recent Labs  Lab 07/27/20 1145  INR 1.9*   Cardiac Enzymes: No results for input(s): CKTOTAL, CKMB, CKMBINDEX, TROPONINI in the last 168 hours. BNP (last 3 results) No results for input(s): PROBNP in the last 8760 hours. HbA1C: No results for input(s): HGBA1C in the last 72 hours. CBG: Recent Labs  Lab 07/29/20 1201 07/29/20 1638 07/29/20 2117 07/30/20 0726 07/30/20 1121  GLUCAP 120* 99 96 90 158*   Lipid Profile: No results for input(s): CHOL, HDL, LDLCALC, TRIG, CHOLHDL, LDLDIRECT in the last 72 hours. Thyroid Function Tests: No results for input(s): TSH, T4TOTAL, FREET4, T3FREE, THYROIDAB in the last 72 hours. Anemia Panel: No results for input(s): VITAMINB12, FOLATE, FERRITIN, TIBC, IRON, RETICCTPCT in the last 72 hours. Sepsis Labs: Recent Labs  Lab 07/27/20 1145 07/27/20 1420 07/29/20 0401   PROCALCITON  --  <0.10  --   LATICACIDVEN 1.5 1.9 1.3    Recent Results (from the past 240 hour(s))  Urine culture     Status: None   Collection Time: 07/27/20 10:57 AM   Specimen: In/Out Cath Urine  Result Value Ref Range Status   Specimen Description   Final    IN/OUT CATH URINE Performed at [facility], [address] [place], [place] [address]    Special Requests   Final    NONE Performed at [facility], [address] [place], [place] [address]    Culture   Final    NO GROWTH Performed at [facility], [place] [address] [place], [place] [address]    Report Status 07/28/2020 FINAL  Final  Respiratory Panel by RT PCR (Flu A&B, Covid) - Nasopharyngeal Swab     Status: None   Collection Time: 07/27/20 11:00 AM   Specimen: Nasopharyngeal Swab  Result Value Ref Range Status   SARS Coronavirus 2 by RT PCR NEGATIVE NEGATIVE Final    Comment: (NOTE) SARS-CoV-2 target nucleic acids are NOT DETECTED.  The SARS-CoV-2 RNA is generally detectable in upper respiratoy specimens during the acute phase of infection. The lowest concentration of SARS-CoV-2 viral copies this assay can detect is 131 copies/mL. A negative result does not preclude SARS-Cov-2 infection and should not be used as the sole basis for treatment or other patient management decisions. A negative result may occur with  improper specimen collection/handling, submission of specimen other than nasopharyngeal swab, presence of viral mutation(s) within the areas targeted by this assay, and inadequate number of viral copies (<131 copies/mL). A negative result must be combined with clinical observations, patient history, and epidemiological information. The expected result is Negative.  Fact Sheet for Patients:  PinkCheek.be  Fact Sheet for Healthcare Providers:  GravelBags.it  This test is no t yet approved or cleared by the [place] FDA and  has  been authorized for detection and/or diagnosis of SARS-CoV-2 by FDA under an Emergency Use Authorization (EUA). This EUA will remain  in effect (meaning this test can be used) for the duration of the COVID-19 declaration under Section 564(b)(1) of the Act, 21 U.S.C. section 360bbb-3(b)(1), unless the authorization is terminated or revoked  sooner.     Influenza A by PCR NEGATIVE NEGATIVE Final   Influenza B by PCR NEGATIVE NEGATIVE Final    Comment: (NOTE) The Xpert Xpress SARS-CoV-2/FLU/RSV assay is intended as an aid in  the diagnosis of influenza from Nasopharyngeal swab specimens and  should not be used as a sole basis for treatment. Nasal washings and  aspirates are unacceptable for Xpert Xpress SARS-CoV-2/FLU/RSV  testing.  Fact Sheet for Patients: PinkCheek.be  Fact Sheet for Healthcare Providers: GravelBags.it  This test is not yet approved or cleared by the [place] FDA and  has been authorized for detection and/or diagnosis of SARS-CoV-2 by  FDA under an Emergency Use Authorization (EUA). This EUA will remain  in effect (meaning this test can be used) for the duration of the  Covid-19 declaration under Section 564(b)(1) of the Act, 21  U.S.C. section 360bbb-3(b)(1), unless the authorization is  terminated or revoked. Performed at [facility], [address] [place], [place] [address]   Blood Culture (routine x 2)     Status: None (Preliminary result)   Collection Time: 07/27/20 11:44 AM   Specimen: BLOOD LEFT FOREARM  Result Value Ref Range Status   Specimen Description BLOOD LEFT FOREARM  Final   Special Requests   Final    BOTTLES DRAWN AEROBIC AND ANAEROBIC Blood Culture adequate volume   Culture   Final    NO GROWTH 3 DAYS Performed at [facility], [address], [place], [place] [address]    Report Status PENDING  Incomplete  Blood Culture (routine x 2)     Status: None (Preliminary result)    Collection Time: 07/27/20 11:52 AM   Specimen: BLOOD RIGHT FOREARM  Result Value Ref Range Status   Specimen Description BLOOD RIGHT FOREARM  Final   Special Requests   Final    BOTTLES DRAWN AEROBIC AND ANAEROBIC Blood Culture adequate volume   Culture   Final    NO GROWTH 3 DAYS Performed at [facility], [address] [place], [place] [address]    Report Status PENDING  Incomplete  MRSA PCR Screening     Status: Abnormal   Collection Time: 07/27/20 12:54 PM   Specimen: Nasopharyngeal  Result Value Ref Range Status   MRSA by PCR POSITIVE (A) NEGATIVE Final    Comment:        The GeneXpert MRSA Assay (FDA approved for NASAL specimens only), is one component of a comprehensive MRSA colonization surveillance program. It is not intended to diagnose MRSA infection nor to guide or monitor treatment for MRSA infections. RESULT CALLED TO, READ BACK BY AND VERIFIED WITH: HILTON, LAWERENCE @0742  ON 07/28/20 BY JONES,T Performed at [facility], [address] [place], [place] [address]   Gram stain     Status: None   Collection Time: 07/28/20 11:25 AM   Specimen: Pleura  Result Value Ref Range Status   Specimen Description PLEURAL  Final   Special Requests NONE  Final   Gram Stain   Final    CYTOSPIN SMEAR NO ORGANISMS SEEN WBC PRESENT,BOTH PMN AND MONONUCLEAR Performed at [facility], [address], [place],  [address]    Report Status 07/28/2020 FINAL  Final  Culture, body fluid-bottle     Status: None (Preliminary result)   Collection Time: 07/28/20 11:25 AM   Specimen: Pleura  Result Value Ref Range Status   Specimen Description PLEURAL  Final   Special Requests 10CC  Final   Culture   Final  NO GROWTH 2 DAYS Performed at [facility], [address], [place], [place] [address]    Report Status PENDING  Incomplete    Radiology Studies: No results found. Scheduled Meds:  allopurinol  300 mg Oral Daily   apixaban  5 mg Oral BID   atorvastatin  20 mg  Oral QPM   Chlorhexidine Gluconate Cloth  6 each Topical Daily   Chlorhexidine Gluconate Cloth  6 each Topical Q0600   donepezil  10 mg Oral QHS   gabapentin  100 mg Oral QHS   insulin aspart  0-5 Units Subcutaneous QHS   insulin aspart  0-9 Units Subcutaneous TID WC   metoprolol succinate  50 mg Oral Daily   midodrine  5 mg Oral TID WC   mupirocin ointment  1 application Nasal BID   pantoprazole  40 mg Oral Daily   potassium chloride  40 mEq Oral BID   sodium chloride flush  3 mL Intravenous Q12H   Continuous Infusions:  sodium chloride     diltiazem (CARDIZEM) infusion Stopped (07/28/20 1800)     LOS: 3 days    Irwin Brakeman, MD How to contact the [facility] Attending or Consulting provider Reader or covering provider during after hours [place], for this patient?  1. Check the care team in [facility] and look for a) attending/consulting TRH provider listed and b) the [facility] team listed 2. Log into [email or web address] and use [place]'s universal password to access. If you do not have the password, please contact the hospital operator. 3. Locate the [facility] provider you are looking for under [facility] and page to a number that you can be directly reached. 4. If you still have difficulty reaching the provider, please page the [facility] (Director on Call) for the Hospitalists listed on amion for assistance.  [facility]  If 7PM-7AM, please contact night-coverage [email or web address] 07/30/2020, 3:04 PM

## 2020-07-30 NOTE — Progress Notes (Signed)
Progress Note  Patient Name: Eric Bowman Date of Encounter: 07/30/2020  Primary Cardiologist: Carlyle Dolly, MD  Subjective   Patient awake, no specific complaints of chest pain or palpitations.  Inpatient Medications    Scheduled Meds: . allopurinol  300 mg Oral Daily  . apixaban  5 mg Oral BID  . atorvastatin  20 mg Oral QPM  . Chlorhexidine Gluconate Cloth  6 each Topical Daily  . Chlorhexidine Gluconate Cloth  6 each Topical Q0600  . donepezil  10 mg Oral QHS  . gabapentin  100 mg Oral QHS  . insulin aspart  0-5 Units Subcutaneous QHS  . insulin aspart  0-9 Units Subcutaneous TID WC  . metoprolol tartrate  25 mg Oral Q6H  . midodrine  5 mg Oral TID WC  . mupirocin ointment  1 application Nasal BID  . pantoprazole  40 mg Oral Daily  . potassium chloride  40 mEq Oral BID  . sodium chloride flush  3 mL Intravenous Q12H   Continuous Infusions: . sodium chloride    . diltiazem (CARDIZEM) infusion Stopped (07/28/20 1800)   PRN Meds: sodium chloride, acetaminophen, levalbuterol, ondansetron **OR** ondansetron (ZOFRAN) IV, sodium chloride flush   Vital Signs    Vitals:   07/30/20 0600 07/30/20 0606 07/30/20 0700 07/30/20 0800  BP: 106/73 106/73 120/67   Pulse: 86 98 63   Resp: 14  18   Temp:    98 F (36.7 C)  TempSrc:    Oral  SpO2: 90%  91%   Weight:      Height:        Intake/Output Summary (Last 24 hours) at 07/30/2020 0831 Last data filed at 07/30/2020 0701 Gross per 24 hour  Intake 28.04 ml  Output --  Net 28.04 ml   Filed Weights   07/27/20 1048 07/28/20 0252 07/30/20 0500  Weight: 95.5 kg 93.1 kg 93.1 kg    Telemetry    Atrial fibrillation.  Personally reviewed.  ECG    An ECG dated 07/27/2020 was personally reviewed today and demonstrated:  Rapid atrial fibrillation with lead motion artifact, poor R wave progression, nonspecific T wave changes.  Physical Exam   GEN:  Elderly male.  No acute distress.   Neck: No JVD. Cardiac:   Irregularly irregular without gallop.  Respiratory:  No wheezing. GI: Soft, bowel sounds present. MS:  2+ leg edema.  Labs    Chemistry Recent Labs  Lab 07/27/20 1145 07/28/20 0452 07/29/20 0401  NA 137 140 139  K 3.6 3.0* 3.5  CL 94* 95* 98  CO2 32 35* 32  GLUCOSE 110* 98 101*  BUN 27* 22 27*  CREATININE 0.86 0.67 0.81  CALCIUM 8.6* 8.9 8.4*  PROT 5.8*  --   --   ALBUMIN 3.4*  --   --   AST 31  --   --   ALT 28  --   --   ALKPHOS 83  --   --   BILITOT 1.0  --   --   GFRNONAA >60 >60 >60  GFRAA >60 >60 >60  ANIONGAP 11 10 9      Hematology Recent Labs  Lab 07/28/20 0452 07/29/20 0401 07/30/20 0758  WBC 6.9 8.1 8.0  RBC 4.43 4.58 4.89  HGB 13.0 13.0 13.9  HCT 40.7 42.1 45.9  MCV 91.9 91.9 93.9  MCH 29.3 28.4 28.4  MCHC 31.9 30.9 30.3  RDW 15.6* 15.7* 15.9*  PLT 120* 145* 142*    Radiology  DG Chest Port 1 View  Result Date: 07/28/2020 CLINICAL DATA:  RIGHT pleural effusion post thoracentesis EXAM: PORTABLE CHEST 1 VIEW COMPARISON:  Portable exam 1141 hours compared to 07/27/2020 FINDINGS: Enlargement of cardiac silhouette. Atherosclerotic calcification aorta. Bibasilar pleural effusions and atelectasis decreased on RIGHT since previous exam. No pneumothorax or new infiltrate seen. IMPRESSION: No pneumothorax following thoracentesis. Residual bibasilar pleural effusions and atelectasis. Electronically Signed   By: Lavonia Dana M.D.   On: 07/28/2020 11:50   US THORACENTESIS ASP PLEURAL SPACE W/IMG GUIDE  Result Date: 07/28/2020 INDICATION: RIGHT pleural effusion EXAM: ULTRASOUND GUIDED DIAGNOSTIC AND THERAPEUTIC THORACENTESIS MEDICATIONS: None. COMPLICATIONS: None immediate. PROCEDURE: Patient was unable to provide written informed consent. Patient's wife was not available to provide consent, though she was aware of the need for the procedure. A note of medical necessity for the procedure was placed on the chart by the provider. Time-out protocol was utilized.  Ultrasound was performed to localize and mark an adequate pocket of fluid in the RIGHT chest. The area was then prepped and draped in the normal sterile fashion. 1% Lidocaine was used for local anesthesia. Under ultrasound guidance a 8 French thoracentesis catheter was introduced. Thoracentesis was performed. The catheter was removed and a dressing applied. FINDINGS: A total of approximately 1280 mL of amber colored RIGHT pleural fluid was removed. Samples were sent to the laboratory as requested by the clinical team. IMPRESSION: Successful ultrasound guided RIGHT thoracentesis yielding 1280 mL of pleural fluid. Electronically Signed   By: Lavonia Dana M.D.   On: 07/28/2020 11:58    Patient Profile     86 y.o. male with history of CAD and remote stent intervention (details not clear), hypertension, hyperlipidemia, type 2 diabetes mellitus, reported sarcoidosis, GERD, and dementia, presenting with newly documented atrial fibrillation.  Assessment & Plan    1.  Atrial fibrillation with RVR, persistent and newly documented but absolute duration is uncertain.  CHA2DS2-VASc score is 5.  Heart rate is better controlled on oral Lopressor every 6 hours.  Echocardiogram shows LVEF 60 to 65% with moderate left atrial enlargement.  Currently on Eliquis for stroke prophylaxis.  2.  CAD and remote history of stent intervention, details not clear.  History limited by dementia.  Follow-up ischemic testing from May of this year was consistent with ischemic heart disease, inferolateral scar and apical septal ischemia, managed medically.  Aspirin discontinued given use of Eliquis.  3.  Right pleural effusion status post thoracentesis with removal of 1280 cc.  4.  Mixed hyperlipidemia on Lipitor, most recent LDL 43.  5.  OSA, recently without regular use of CPAP.  6.  Cervical myelopathy, nonambulatory.  7.  COPD and reported history of sarcoidosis.  Will attempt to transition to Toprol-XL beginning at 50 mg  daily and titrating as needed for heart rate control.  As needed IV Lopressor also ordered for persistent tachycardia if occurs.  Otherwise continue Eliquis, Lipitor, and ProAmatine.  Signed, Rozann Lesches, MD  07/30/2020, 8:31 AM

## 2020-07-30 NOTE — Evaluation (Signed)
Physical Therapy Evaluation Patient Details Name: Eric Bowman MRN: [id number] DOB: [birth year] Today's Date: 07/30/2020   History of Present Illness  Eric Bowman is a 86 y.o. male with medical history significant for dementia, cervical myelopathy with inability to ambulate, CAD, OSA, gout, hyperlipidemia, hypertension, and GERD who was brought from [facility] with some hypoxemia as well as tachycardia and weakness.  His heart rate was approximately 140 bpm and he was noted to be in atrial fibrillation which was new for him.  He was also noted to be tachypneic and has not a known moderate-sized pleural effusion from ultrasound on 9/20, but thoracentesis could not be performed at that time since he could not sit upright.  He was suspected to have pneumonia a few days prior on 9/22 for which he was started on Levaquin intravenously for treatment. 9/28: Patient has been admitted with new onset atrial fibrillation with RVR and remains on Cardizem drip this morning.  Cardiology planning to wean off drip with use of metoprolol.  He has undergone right-sided thoracentesis with 1280 mL removed.  Plans to hold Lasix today given softer blood pressure readings with close monitoring.    Clinical Impression  Pt admitted with above diagnosis. Per patient report, non-ambulatory due to a back nerve injury. Patient requires mod to max assistance for bed mobility and exhibits poor sitting balance as well. Pt currently with functional limitations due to the deficits listed below (see PT Problem List). Pt will benefit from skilled PT to increase their independence and safety with mobility to allow discharge to the venue listed below.       Follow Up Recommendations SNF;Supervision/Assistance - 24 hour    Equipment Recommendations  None recommended by PT    Recommendations for Other Services       Precautions / Restrictions Precautions Precautions: Fall      Mobility  Bed Mobility Overal bed mobility:  Needs Assistance Bed Mobility: Rolling Rolling: Mod assist   Supine to sit: Max assist Sit to supine: Max assist   General bed mobility comments: assist for LEs and trunk  Transfers                 General transfer comment: non-ambulatory  Ambulation/Gait             General Gait Details: non-ambulatory  Stairs            Wheelchair Mobility    Modified Rankin (Stroke Patients Only)       Balance Overall balance assessment: Needs assistance Sitting-balance support: Feet supported Sitting balance-Leahy Scale: Poor Sitting balance - Comments: at bedside          Pertinent Vitals/Pain Pain Assessment: No/denies pain    Home Living Family/patient expects to be discharged to:: Skilled nursing facility       Prior Function     Gait / Transfers Assistance Needed: per patient report non-ambulatory and pushed in wheelchair at [place] [facility] [employer] Assistance Needed: dependent        Hand Dominance        Extremity/Trunk Assessment   Upper Extremity Assessment Upper Extremity Assessment: Generalized weakness    Lower Extremity Assessment Lower Extremity Assessment: RLE deficits/detail;LLE deficits/detail RLE Deficits / Details: flaccid LLE Deficits / Details: flaccid       Communication   Communication: No difficulties  Cognition Arousal/Alertness: Awake/alert Behavior During Therapy: WFL for tasks assessed/performed Overall Cognitive Status: No family/caregiver present to determine baseline cognitive functioning  General Comments: vascular dementia noted in chart review      General Comments      Exercises     Assessment/Plan    PT Assessment Patient needs continued PT services  PT Problem List Decreased strength;Decreased mobility;Decreased activity tolerance;Decreased balance       PT Treatment Interventions DME instruction;Therapeutic exercise;Balance training;Functional mobility  training;Therapeutic activities;Patient/family education    PT Goals (Current goals can be found in the Care Plan section)  Acute Rehab PT Goals Patient Stated Goal: Return to [facility] PT Goal Formulation: With patient Time For Goal Achievement: 08/13/20 Potential to Achieve Goals: Fair    Frequency Min 2X/week   Barriers to discharge           AM-PAC PT "6 Clicks" Mobility  Outcome Measure Help needed turning from your back to your side while in a flat bed without using bedrails?: A Lot Help needed moving from lying on your back to sitting on the side of a flat bed without using bedrails?: A Lot Help needed moving to and from a bed to a chair (including a wheelchair)?: Total Help needed standing up from a chair using your arms (e.g., wheelchair or bedside chair)?: Total Help needed to walk in hospital room?: Total Help needed climbing 3-5 steps with a railing? : Total 6 Click Score: 8    End of Session   Activity Tolerance: Patient tolerated treatment well Patient left: in bed;with call bell/phone within reach Nurse Communication: Mobility status PT Visit Diagnosis: Unsteadiness on feet (R26.81);Other abnormalities of gait and mobility (R26.89);Muscle weakness (generalized) (M62.81);Difficulty in walking, not elsewhere classified (R26.2);Other symptoms and signs involving the nervous system (R29.898)    Time: 1300-1330 PT Time Calculation (min) (ACUTE ONLY): 30 min   Charges:   PT Evaluation $PT Eval Moderate Complexity: 1 Mod          Loralee Weitzman D. Hartnett-Rands, MS, PT Per [place] [phone] 07/30/2020, 3:00 PM

## 2020-07-31 ENCOUNTER — Inpatient Hospital Stay (HOSPITAL_COMMUNITY): Payer: Medicare Other

## 2020-07-31 LAB — BASIC METABOLIC PANEL
Anion gap: 9 (ref 5–15)
BUN: 25 mg/dL — ABNORMAL HIGH (ref 8–23)
CO2: 31 mmol/L (ref 22–32)
Calcium: 8.4 mg/dL — ABNORMAL LOW (ref 8.9–10.3)
Chloride: 99 mmol/L (ref 98–111)
Creatinine, Ser: 0.55 mg/dL — ABNORMAL LOW (ref 0.61–1.24)
GFR calc Af Amer: >60 mL/min (ref >60)
GFR calc non Af Amer: >60 mL/min (ref >60)
Glucose, Bld: 89 mg/dL (ref 70–99)
Potassium: 4 mmol/L (ref 3.5–5.1)
Sodium: 139 mmol/L (ref 135–145)

## 2020-07-31 LAB — CBC
HCT: 43.4 % (ref 39.0–52.0)
Hemoglobin: 13.5 g/dL (ref 13.0–17.0)
MCH: 28.5 pg (ref 26.0–34.0)
MCHC: 31.1 g/dL (ref 30.0–36.0)
MCV: 91.6 fL (ref 80.0–100.0)
Platelets: 158 10*3/uL (ref 150–400)
RBC: 4.74 MIL/uL (ref 4.22–5.81)
RDW: 15.6 % — ABNORMAL HIGH (ref 11.5–15.5)
WBC: 8.5 10*3/uL (ref 4.0–10.5)
nRBC: 0 % (ref 0.0–0.2)

## 2020-07-31 LAB — GLUCOSE, CAPILLARY
Glucose-Capillary: 91 mg/dL (ref 70–99)
Glucose-Capillary: 102 mg/dL — ABNORMAL HIGH (ref 70–99)
Glucose-Capillary: 98 mg/dL (ref 70–99)
Glucose-Capillary: 131 mg/dL — ABNORMAL HIGH (ref 70–99)

## 2020-07-31 LAB — MAGNESIUM: Magnesium: 2 mg/dL (ref 1.7–2.4)

## 2020-07-31 MED ORDER — METOPROLOL SUCCINATE ER 50 MG PO TB24
75.0000 mg | ORAL_TABLET | Freq: Every day | ORAL | Status: DC
  Administered 2020-07-31 – 2020-08-01 (×2): 75 mg via ORAL
  Filled 2020-07-31 (×2): qty 1

## 2020-07-31 MED ORDER — TORSEMIDE 20 MG PO TABS
20.0000 mg | ORAL_TABLET | Freq: Every day | ORAL | Status: DC
  Administered 2020-07-31 – 2020-08-01 (×2): 20 mg via ORAL
  Filled 2020-07-31 (×3): qty 1

## 2020-07-31 NOTE — Evaluation (Signed)
  Modified Barium Swallow Progress Note  Patient Details  Name: Eric Bowman MRN: [id number] Date of Birth: 09-15-1933  Today's Date: 07/31/2020  Modified Barium Swallow completed.  Full report located under Chart Review in the Imaging Section.  Brief recommendations include the following:  Clinical Impression  Pt presents with moderate oropharyngeal dysphagia characterized by consistent silent aspiration (one or two of multiple episodes were sensed with a reflexive cough) of thin liquids (with all presentations - tsp, cup and straw) as a result of poor laryngeal vestibule closure (LVC). Occasional premature spillage of thin liquids is immediately aspirated before the swallow. With all cup and straw sips of thin liquids Pt aspirated trace to moderate amount of thin liquid during the swallow. Further note, decreased LVC is specifically characterized by decreased arytenoid adduction, decreased epiglottic deflection, and decreased pharyngeal constriction. Pt was congnitively unable to consistently complete chin tuck maneuver. NTL were consumed with visualized flash penetration and no aspiration. No penetration or aspiration was visualized with HTL trials however note increased pharyngeal residuals. Pt consumed puree textures with trace vallecuale residuals that were cleared with cued repeat swallow. It is possible and notable that thickened liquids and/or puree texture residuals will mix with secretions and eventually be aspirated in small amounts.  Recommend continue with D1/puree diet and NECTAR THICK liquids with meds crushed in puree. ST will continue to follow acutely. Recommend ST f/u at SNF for diet tolerance and dysphagia therapy where Pt is a resident and recommend consider repeat MBSS when clinically appropriate.    Swallow Evaluation Recommendations       SLP Diet Recommendations: Dysphagia 1 (Puree) solids;Nectar thick liquid   Liquid Administration via: Cup   Medication  Administration: Crushed with puree   Supervision: Full assist for feeding;Full supervision/cueing for compensatory strategies   Compensations: Minimize environmental distractions;Slow rate;Follow solids with liquid   Postural Changes: Remain semi-upright after after feeds/meals (Comment);Seated upright at 90 degrees   Oral Care Recommendations: Oral care BID   Other Recommendations: Order thickener from [employer]. Roddie Mc, [place] Speech Language Pathologist   Wende Bushy 07/31/2020,11:31 AM

## 2020-07-31 NOTE — Care Management Important Message (Signed)
Important Message  Patient Details  Name: Eric Bowman MRN: [id number] Date of Birth: 1933-05-21   Medicare Important Message Given:  Yes     Tommy Medal 07/31/2020, 1:23 PM

## 2020-07-31 NOTE — Progress Notes (Signed)
PROGRESS NOTE    Eric Bowman  XVQ:[id number] DOB: Sep 01, 1933 DOA: 07/27/2020 PCP: Gerlene Fee, NP   Brief Narrative:  Per HPI: Eric Bowman is a 86 y.o. male with medical history significant for dementia, cervical myelopathy with inability to ambulate, CAD, OSA, gout, hyperlipidemia, hypertension, and GERD who was brought from [facility] with some hypoxemia as well as tachycardia and weakness.  His heart rate was approximately 140 bpm and he was noted to be in atrial fibrillation which was new for him.  He was also noted to be tachypneic and has not a known moderate-sized pleural effusion from ultrasound on 9/20, but thoracentesis could not be performed at that time since he could not sit upright.  He was suspected to have pneumonia a few days prior on 9/22 for which he was started on Levaquin intravenously for treatment.  9/28: Patient has been admitted with new onset atrial fibrillation with RVR and remains on Cardizem drip this morning.  Cardiology planning to wean off drip with use of metoprolol.  He has undergone right-sided thoracentesis with 1280 mL removed.  Plans to hold Lasix today given softer blood pressure readings with close monitoring.  9/29: HR better controlled.  Poor appetite with hypoglycemia.   9/30: Pt not eating well but HR is better controlled.   Assessment & Plan:   Active Problems:   Pleural effusion   Atrial fibrillation with RVR (HCC)   S/P thoracentesis   New onset atrial fibrillation with RVR-improving -2D echocardiogram 03/2020 with LVEF 55-60% -Limited 2D echocardiogram with LVEF 60-65% with mild RV dysfunction and moderate biatrial enlargement -TSH 2.3 -Maintain on Cardizem drip for rate control and wean with addition of metoprolol per cardiology on 9/28,  -transitioned to toprol XL 9/30  Dose increased to 75 mg daily for better HR control.  -Continue anticoagulation with Eliquis (unknown as to why patient is on this medication) -Appreciate  cardiology evaluation and assistance with rate control  Right-sided pleural effusion status post thoracentesis on 9/28 -1280 mL fluid removed per radiology  History of hypertension currently with hypotension -Hold further diuresis as well as home lisinopril  History of CAD with prior stent -Continue aspirin and statin -No chest pain or signs of ACS noted  History of COPD/pulmonary sarcoid -Breathing treatments with Xopenex as needed with no acute bronchospasms currently noted  History of diabetes -No significant hyperglycemia noted, will try SSI for coverage and carb modified diet -Hemoglobin A1c on 05/2020 5.5%  CBG (last 3)  Recent Labs    07/30/20 2153 07/31/20 0745 07/31/20 1120  GLUCAP 125* 91 102*    Hypoglycemia - due to poor oral intake, treated with dextrose  History of dementia -Continue Aricept  History of gout -Continue allopurinol -No acute issues currently  Dyslipidemia -Continue statin  OSA with noncompliance on CPAP -Order CPAP at bedtime  GERD -PPI  Cervical myelopathy -Wheelchair-bound  DVT prophylaxis:Eliquis Code Status: DNR Family Communication: Discussed with wife on phone 9/28, (9/30, 10/1) no answer,  Disposition Plan:  Status is: Inpatient  Remains inpatient appropriate because:Hemodynamically unstable, IV treatments appropriate due to intensity of illness or inability to take PO and Inpatient level of care appropriate due to severity of illness  Dispo: The patient is from: SNF              Anticipated d/c is to: SNF              Anticipated d/c date is: 3 days  Patient currently is not medically stable to d/c.  Patient requires ongoing rate control and stabilization per cardiology prior to discharge.  Consultants:   Cardiology  Procedures:   See below  Antimicrobials:  Anti-infectives (From admission, onward)   Start     Dose/Rate Route Frequency Ordered Stop   07/28/20 1200  vancomycin  (VANCOREADY) IVPB 1500 mg/300 mL  Status:  Discontinued        1,500 mg 150 mL/hr over 120 Minutes Intravenous Every 24 hours 07/27/20 1253 07/27/20 1337   07/27/20 2000  ceFEPIme (MAXIPIME) 2 g in sodium chloride 0.9 % 100 mL IVPB  Status:  Discontinued        2 g 200 mL/hr over 30 Minutes Intravenous Every 8 hours 07/27/20 1248 07/27/20 1337   07/27/20 1100  vancomycin (VANCOCIN) IVPB 1000 mg/200 mL premix  Status:  Discontinued        1,000 mg 200 mL/hr over 60 Minutes Intravenous  Once 07/27/20 1056 07/27/20 1057   07/27/20 1100  ceFEPIme (MAXIPIME) 2 g in sodium chloride 0.9 % 100 mL IVPB        2 g 200 mL/hr over 30 Minutes Intravenous  Once 07/27/20 1056 07/27/20 1302   07/27/20 1100  vancomycin (VANCOREADY) IVPB 2000 mg/400 mL        2,000 mg 200 mL/hr over 120 Minutes Intravenous  Once 07/27/20 1057 07/27/20 1459     Subjective: Patient says he has no chest pain or palpitations, he does have some SOB.   Objective: Vitals:   07/30/20 1529 07/30/20 1720 07/30/20 2100 07/31/20 0500  BP:  106/71 103/90 108/78  Pulse:  88 (!) 102 98  Resp:  18 18 18   Temp: 98.3 F (36.8 C) 98.7 F (37.1 C) (!) 97.4 F (36.3 C) 98.6 F (37 C)  TempSrc: Oral Oral    SpO2:  96% 94% 95%  Weight:    94.3 kg  Height:        Intake/Output Summary (Last 24 hours) at 07/31/2020 1426 Last data filed at 07/31/2020 1300 Gross per 24 hour  Intake 720 ml  Output 200 ml  Net 520 ml   Filed Weights   07/28/20 0252 07/30/20 0500 07/31/20 0500  Weight: 93.1 kg 93.1 kg 94.3 kg    Examination:  General exam: Appears calm and comfortable,NAD Respiratory system: Clear to auscultation. Respiratory effort normal.  Currently on room air. Cardiovascular system: normal S1 & S2 heard, irregular heart rate Gastrointestinal system: Abdomen is nondistended, soft and nontender.  Central nervous system: nonfocal exam.  Extremities: leg edema trace, bilateral Skin: No rashes, lesions or ulcers Psychiatry:  Flat affect  Data Reviewed: I have personally reviewed following labs and imaging studies  CBC: Recent Labs  Lab 07/27/20 1145 07/28/20 0452 07/29/20 0401 07/30/20 0758 07/31/20 0333  WBC 8.4 6.9 8.1 8.0 8.5  NEUTROABS 6.6  --   --   --   --   HGB 13.6 13.0 13.0 13.9 13.5  HCT 41.4 40.7 42.1 45.9 43.4  MCV 90.6 91.9 91.9 93.9 91.6  PLT 133* 120* 145* 142* 633   Basic Metabolic Panel: Recent Labs  Lab 07/27/20 1145 07/28/20 0452 07/29/20 0401 07/30/20 0758 07/31/20 0333  NA 137 140 139 139 139  K 3.6 3.0* 3.5 4.0 4.0  CL 94* 95* 98 100 99  CO2 32 35* 32 31 31  GLUCOSE 110* 98 101* 100* 89  BUN 27* 22 27* 23 25*  CREATININE 0.86 0.67 0.81 0.66 0.55*  CALCIUM  8.6* 8.9 8.4* 8.5* 8.4*  MG  --  1.3* 1.8 1.8 2.0   GFR: Estimated Creatinine Clearance: 77.9 mL/min (A) (by C-G formula based on SCr of 0.55 mg/dL (L)). Liver Function Tests: Recent Labs  Lab 07/27/20 1145  AST 31  ALT 28  ALKPHOS 83  BILITOT 1.0  PROT 5.8*  ALBUMIN 3.4*   No results for input(s): LIPASE, AMYLASE in the last 168 hours. No results for input(s): AMMONIA in the last 168 hours. Coagulation Profile: Recent Labs  Lab 07/27/20 1145  INR 1.9*   Cardiac Enzymes: No results for input(s): CKTOTAL, CKMB, CKMBINDEX, TROPONINI in the last 168 hours. BNP (last 3 results) No results for input(s): PROBNP in the last 8760 hours. HbA1C: No results for input(s): HGBA1C in the last 72 hours. CBG: Recent Labs  Lab 07/30/20 1121 07/30/20 1527 07/30/20 2153 07/31/20 0745 07/31/20 1120  GLUCAP 158* 138* 125* 91 102*   Lipid Profile: No results for input(s): CHOL, HDL, LDLCALC, TRIG, CHOLHDL, LDLDIRECT in the last 72 hours. Thyroid Function Tests: No results for input(s): TSH, T4TOTAL, FREET4, T3FREE, THYROIDAB in the last 72 hours. Anemia Panel: No results for input(s): VITAMINB12, FOLATE, FERRITIN, TIBC, IRON, RETICCTPCT in the last 72 hours. Sepsis Labs: Recent Labs  Lab 07/27/20 1145  07/27/20 1420 07/29/20 0401  PROCALCITON  --  <0.10  --   LATICACIDVEN 1.5 1.9 1.3    Recent Results (from the past 240 hour(s))  Urine culture     Status: None   Collection Time: 07/27/20 10:57 AM   Specimen: In/Out Cath Urine  Result Value Ref Range Status   Specimen Description   Final    IN/OUT CATH URINE Performed at [facility], [address] [place], [place] [address]    Special Requests   Final    NONE Performed at [facility], [address] [place], [place] [address]    Culture   Final    NO GROWTH Performed at [facility], [place] [address] [place], [place] [address]    Report Status 07/28/2020 FINAL  Final  Respiratory Panel by RT PCR (Flu A&B, Covid) - Nasopharyngeal Swab     Status: None   Collection Time: 07/27/20 11:00 AM   Specimen: Nasopharyngeal Swab  Result Value Ref Range Status   SARS Coronavirus 2 by RT PCR NEGATIVE NEGATIVE Final    Comment: (NOTE) SARS-CoV-2 target nucleic acids are NOT DETECTED.  The SARS-CoV-2 RNA is generally detectable in upper respiratoy specimens during the acute phase of infection. The lowest concentration of SARS-CoV-2 viral copies this assay can detect is 131 copies/mL. A negative result does not preclude SARS-Cov-2 infection and should not be used as the sole basis for treatment or other patient management decisions. A negative result may occur with  improper specimen collection/handling, submission of specimen other than nasopharyngeal swab, presence of viral mutation(s) within the areas targeted by this assay, and inadequate number of viral copies (<131 copies/mL). A negative result must be combined with clinical observations, patient history, and epidemiological information. The expected result is Negative.  Fact Sheet for Patients:  PinkCheek.be  Fact Sheet for Healthcare Providers:  GravelBags.it  This test is no t yet approved or cleared by the  [place] FDA and  has been authorized for detection and/or diagnosis of SARS-CoV-2 by FDA under an Emergency Use Authorization (EUA). This EUA will remain  in effect (meaning this test can be used) for the duration of the COVID-19 declaration under Section 564(b)(1) of  the Act, 21 U.S.C. section 360bbb-3(b)(1), unless the authorization is terminated or revoked sooner.     Influenza A by PCR NEGATIVE NEGATIVE Final   Influenza B by PCR NEGATIVE NEGATIVE Final    Comment: (NOTE) The Xpert Xpress SARS-CoV-2/FLU/RSV assay is intended as an aid in  the diagnosis of influenza from Nasopharyngeal swab specimens and  should not be used as a sole basis for treatment. Nasal washings and  aspirates are unacceptable for Xpert Xpress SARS-CoV-2/FLU/RSV  testing.  Fact Sheet for Patients: PinkCheek.be  Fact Sheet for Healthcare Providers: GravelBags.it  This test is not yet approved or cleared by the [place] FDA and  has been authorized for detection and/or diagnosis of SARS-CoV-2 by  FDA under an Emergency Use Authorization (EUA). This EUA will remain  in effect (meaning this test can be used) for the duration of the  Covid-19 declaration under Section 564(b)(1) of the Act, 21  U.S.C. section 360bbb-3(b)(1), unless the authorization is  terminated or revoked. Performed at [facility], [address], [place], [place] [address]   Blood Culture (routine x 2)     Status: None (Preliminary result)   Collection Time: 07/27/20 11:44 AM   Specimen: BLOOD LEFT FOREARM  Result Value Ref Range Status   Specimen Description BLOOD LEFT FOREARM  Final   Special Requests   Final    BOTTLES DRAWN AEROBIC AND ANAEROBIC Blood Culture adequate volume   Culture   Final    NO GROWTH 4 DAYS Performed at [facility], [address] [place], [place] [address]    Report Status PENDING  Incomplete  Blood Culture (routine x 2)     Status:  None (Preliminary result)   Collection Time: 07/27/20 11:52 AM   Specimen: BLOOD RIGHT FOREARM  Result Value Ref Range Status   Specimen Description BLOOD RIGHT FOREARM  Final   Special Requests   Final    BOTTLES DRAWN AEROBIC AND ANAEROBIC Blood Culture adequate volume   Culture   Final    NO GROWTH 4 DAYS Performed at [facility], [address], [place], [place] [address]    Report Status PENDING  Incomplete  MRSA PCR Screening     Status: Abnormal   Collection Time: 07/27/20 12:54 PM   Specimen: Nasopharyngeal  Result Value Ref Range Status   MRSA by PCR POSITIVE (A) NEGATIVE Final    Comment:        The GeneXpert MRSA Assay (FDA approved for NASAL specimens only), is one component of a comprehensive MRSA colonization surveillance program. It is not intended to diagnose MRSA infection nor to guide or monitor treatment for MRSA infections. RESULT CALLED TO, READ BACK BY AND VERIFIED WITH: HILTON, LAWERENCE @0742  ON 07/28/20 BY JONES,T Performed at [facility], [address] [place], [place] [address]   Gram stain     Status: None   Collection Time: 07/28/20 11:25 AM   Specimen: Pleura  Result Value Ref Range Status   Specimen Description PLEURAL  Final   Special Requests NONE  Final   Gram Stain   Final    CYTOSPIN SMEAR NO ORGANISMS SEEN WBC PRESENT,BOTH PMN AND MONONUCLEAR Performed at [facility], [address] [place],  [address]    Report Status 07/28/2020 FINAL  Final  Culture, body fluid-bottle     Status: None (Preliminary result)   Collection Time: 07/28/20 11:25 AM   Specimen: Pleura  Result Value Ref Range Status   Specimen Description PLEURAL  Final  Special Requests 10CC  Final   Culture   Final    NO GROWTH 3 DAYS Performed at [facility], [address], [place], [place] [address]    Report Status PENDING  Incomplete    Radiology Studies: DG Swallowing Func-Speech Pathology  Result Date: 07/31/2020 Objective Swallowing  Evaluation: Type of Study: MBS-Modified Barium Swallow Study  Patient Details Name: CHICK COUSINS MRN: [id number] Date of Birth: 04-03-1933 Today's Date: 07/31/2020 Time: SLP Start Time (ACUTE ONLY): 1015 -SLP Stop Time (ACUTE ONLY): 1042 SLP Time Calculation (min) (ACUTE ONLY): 27 min Past Medical History: Past Medical History: Diagnosis Date  Cancer ([place])   melanoma-left side  Chronic pain   legs and feet  Coronary artery disease   Dementia ([place])   Diabetes mellitus without complication ([place])   Foot drop   GERD (gastroesophageal reflux disease)   Gout   Hyperlipidemia   Hypertension   OCD (obsessive compulsive disorder)   PONV (postoperative nausea and vomiting)   Psychosis ([place])   Reflux   Sarcoidosis   Sleep apnea   cpap Past Surgical History: Past Surgical History: Procedure Laterality Date  BACK SURGERY    cervical neck  CATARACT EXTRACTION W/PHACO  01/05/2012  Procedure: CATARACT EXTRACTION PHACO AND INTRAOCULAR LENS PLACEMENT ([place]);  Surgeon: [name] Branch, MD;  Location: [facility];  Service: Ophthalmology;  Laterality: Left;  CDE 18.47  CATARACT EXTRACTION W/PHACO  02/02/2012  Procedure: CATARACT EXTRACTION PHACO AND INTRAOCULAR LENS PLACEMENT ([facility]);  Surgeon: [name] Branch, MD;  Location: [facility];  Service: Ophthalmology;  Laterality: Right;  CDE:15.38  CHOLECYSTECTOMY    GALLBLADDER SURGERY    hemorhoidectomy    MELANOMA EXCISION    left side-[name]  TONSILLECTOMY   HPI: TREVAR BOEHRINGER is a 86 y.o. male with medical history significant for dementia, cervical myelopathy with inability to ambulate, CAD, OSA, gout, hyperlipidemia, hypertension, and GERD who was brought from [facility] with some hypoxemia as well as tachycardia and weakness.  His heart rate was approximately 140 bpm and he was noted to be in atrial fibrillation which was new for him.  He was also noted to be tachypneic and has not a known moderate-sized pleural effusion from ultrasound on 9/20, but thoracentesis could not be  performed at that time since he could not sit upright.  He was suspected to have pneumonia a few days prior on 9/22 for which he was started on Levaquin intravenously for treatment. Right-sided pleural effusion status post thoracentesis on 9/28. BSE requested 07/29/20.  Subjective: Pt was very alert and responsive today Assessment / Plan / Recommendation CHL IP CLINICAL IMPRESSIONS 07/31/2020 Clinical Impression Pt presents with moderate oropharyngeal dysphagia characterized by consistent silent aspiration (one or two of multiple episodes were sensed with a reflexive cough) of thin liquids (with all presentations - tsp, cup and straw) as a result of poor laryngeal vestibule closure (LVC). Occasional premature spillage of thin liquids is immediately aspirated before the swallow. With all cup and straw sips of thin liquids Pt aspirated trace to moderate amount of thin liquid during the swallow. Further note, decreased LVC is specifically characterized by decreased arytenoid adduction, decreased epiglottic deflection, and decreased pharyngeal constriction. Pt was congnitively unable to consistently complete chin tuck maneuver. NTL were consumed with visualized flash penetration and no aspiration. No penetration or aspiration was visualized with HTL trials however note increased pharyngeal residuals. Pt consumed puree textures with trace vallecuale residuals that were cleared with cued repeat swallow. It is possible and important to note  that thickened liquids and/or puree texture residuals will mix with secretions and eventually be aspirated in small amounts.  Recommend continue with D1/puree diet and NECTAR THICK liquids with meds crushed in puree. ST will continue to follow acutely. Recommend ST f/u at SNF for diet tolerance and dysphagia therapy where Pt is a resident and recommend consider repeat MBSS when clinically appropriate.  SLP Visit Diagnosis Dysphagia, unspecified (R13.10) Attention and concentration deficit  following -- Frontal lobe and executive function deficit following -- Impact on safety and function Mild aspiration risk;Moderate aspiration risk   CHL IP TREATMENT RECOMMENDATION 07/31/2020 Treatment Recommendations Therapy as outlined in treatment plan below   Prognosis 07/31/2020 Prognosis for Safe Diet Advancement Fair Barriers to Reach Goals Cognitive deficits Barriers/Prognosis Comment -- CHL IP DIET RECOMMENDATION 07/31/2020 SLP Diet Recommendations Dysphagia 1 (Puree) solids;Nectar thick liquid Liquid Administration via Cup Medication Administration Crushed with puree Compensations Minimize environmental distractions;Slow rate;Follow solids with liquid Postural Changes Remain semi-upright after after feeds/meals (Comment);Seated upright at 90 degrees   CHL IP OTHER RECOMMENDATIONS 07/31/2020 Recommended Consults -- Oral Care Recommendations Oral care BID Other Recommendations Order thickener from pharmacy   CHL IP FOLLOW UP RECOMMENDATIONS 07/31/2020 Follow up Recommendations Skilled Nursing facility;24 hour supervision/assistance   CHL IP FREQUENCY AND DURATION 07/31/2020 Speech Therapy Frequency (ACUTE ONLY) min 1 x/week Treatment Duration 1 week      CHL IP ORAL PHASE 07/31/2020 Oral Phase Impaired Oral - Pudding Teaspoon -- Oral - Pudding Cup -- Oral - Honey Teaspoon -- Oral - Honey Cup -- Oral - Nectar Teaspoon -- Oral - Nectar Cup -- Oral - Nectar Straw -- Oral - Thin Teaspoon Premature spillage Oral - Thin Cup Premature spillage Oral - Thin Straw Premature spillage Oral - Puree -- Oral - Mech Soft -- Oral - Regular -- Oral - Multi-Consistency -- Oral - Pill -- Oral Phase - Comment --  CHL IP PHARYNGEAL PHASE 07/31/2020 Pharyngeal Phase Impaired Pharyngeal- Pudding Teaspoon -- Pharyngeal -- Pharyngeal- Pudding Cup -- Pharyngeal -- Pharyngeal- Honey Teaspoon -- Pharyngeal -- Pharyngeal- Honey Cup Pharyngeal residue - valleculae;Pharyngeal residue - pyriform;Reduced epiglottic inversion;Reduced pharyngeal  peristalsis;Reduced laryngeal elevation;Reduced tongue base retraction;Reduced airway/laryngeal closure Pharyngeal -- Pharyngeal- Nectar Teaspoon -- Pharyngeal -- Pharyngeal- Nectar Cup Reduced pharyngeal peristalsis;Reduced epiglottic inversion;Reduced airway/laryngeal closure;Reduced tongue base retraction;Penetration/Aspiration during swallow;Pharyngeal residue - valleculae;Pharyngeal residue - pyriform Pharyngeal Material enters airway, remains ABOVE vocal cords then ejected out Pharyngeal- Nectar Straw -- Pharyngeal -- Pharyngeal- Thin Teaspoon Reduced pharyngeal peristalsis;Reduced epiglottic inversion;Reduced airway/laryngeal closure;Reduced tongue base retraction;Penetration/Aspiration during swallow;Penetration/Aspiration before swallow;Moderate aspiration;Trace aspiration;Pharyngeal residue - valleculae;Pharyngeal residue - pyriform Pharyngeal Material enters airway, passes BELOW cords without attempt by patient to eject out (silent aspiration);Material enters airway, passes BELOW cords and not ejected out despite cough attempt by patient Pharyngeal- Thin Cup Reduced pharyngeal peristalsis;Reduced epiglottic inversion;Reduced airway/laryngeal closure;Reduced tongue base retraction;Penetration/Aspiration during swallow;Penetration/Aspiration before swallow;Moderate aspiration;Trace aspiration;Pharyngeal residue - valleculae;Pharyngeal residue - pyriform Pharyngeal Material enters airway, passes BELOW cords without attempt by patient to eject out (silent aspiration);Material enters airway, passes BELOW cords and not ejected out despite cough attempt by patient Pharyngeal- Thin Straw Reduced pharyngeal peristalsis;Reduced epiglottic inversion;Reduced airway/laryngeal closure;Reduced tongue base retraction;Penetration/Aspiration during swallow;Penetration/Aspiration before swallow;Moderate aspiration;Trace aspiration;Pharyngeal residue - valleculae;Pharyngeal residue - pyriform Pharyngeal Material enters  airway, passes BELOW cords without attempt by patient to eject out (silent aspiration);Material enters airway, passes BELOW cords and not ejected out despite cough attempt by patient Pharyngeal- Puree Pharyngeal residue - valleculae;Pharyngeal residue - pyriform;Reduced epiglottic inversion Pharyngeal -- Pharyngeal- Mechanical Soft --  Pharyngeal -- Pharyngeal- Regular (No Data) Pharyngeal -- Pharyngeal- Multi-consistency -- Pharyngeal -- Pharyngeal- Pill -- Pharyngeal -- Pharyngeal Comment --  CHL IP CERVICAL ESOPHAGEAL PHASE 07/31/2020 Cervical Esophageal Phase WFL Pudding Teaspoon -- Pudding Cup -- Honey Teaspoon -- Honey Cup -- Nectar Teaspoon -- Nectar Cup -- Nectar Straw -- Thin Teaspoon -- Thin Cup -- Thin Straw -- Puree -- Mechanical Soft -- Regular -- Multi-consistency -- Pill -- Cervical Esophageal Comment -- Amelia H. Roddie Mc, CCC-SLP Speech Language Pathologist Wende Bushy 07/31/2020, 11:34 AM              Scheduled Meds:  allopurinol  300 mg Oral Daily   apixaban  5 mg Oral BID   atorvastatin  20 mg Oral QPM   donepezil  10 mg Oral QHS   gabapentin  100 mg Oral QHS   insulin aspart  0-5 Units Subcutaneous QHS   insulin aspart  0-9 Units Subcutaneous TID WC   metoprolol succinate  75 mg Oral Daily   midodrine  5 mg Oral TID WC   mupirocin ointment  1 application Nasal BID   pantoprazole  40 mg Oral Daily   potassium chloride  40 mEq Oral Daily   sodium chloride flush  3 mL Intravenous Q12H   torsemide  20 mg Oral Daily   Continuous Infusions:  sodium chloride       LOS: 4 days    Irwin Brakeman, MD How to contact the [facility] Attending or Consulting provider [place] or covering provider during after hours [place], for this patient?  1. Check the care team in [facility] and look for a) attending/consulting [facility] provider listed and b) the [facility] team listed 2. Log into [email or web address] and use [place]'s universal password to access. If you do not have the password,  please contact the hospital operator. 3. Locate the [facility] provider you are looking for under [facility] and page to a number that you can be directly reached. 4. If you still have difficulty reaching the provider, please page the [facility] (Director on Call) for the Hospitalists listed on amion for assistance.  [facility]  If 7PM-7AM, please contact night-coverage [email or web address] 07/31/2020, 2:26 PM

## 2020-07-31 NOTE — Progress Notes (Addendum)
Progress Note  Patient Name: Eric Bowman Date of Encounter: 07/31/2020  Primary Cardiologist: Carlyle Dolly, MD   Subjective   Says he still feels short of breath but this has been occurring for over one year. No chest pain or palpitations.   Inpatient Medications    Scheduled Meds: . allopurinol  300 mg Oral Daily  . apixaban  5 mg Oral BID  . atorvastatin  20 mg Oral QPM  . Chlorhexidine Gluconate Cloth  6 each Topical Daily  . Chlorhexidine Gluconate Cloth  6 each Topical Q0600  . donepezil  10 mg Oral QHS  . gabapentin  100 mg Oral QHS  . insulin aspart  0-5 Units Subcutaneous QHS  . insulin aspart  0-9 Units Subcutaneous TID WC  . metoprolol succinate  75 mg Oral Daily  . midodrine  5 mg Oral TID WC  . mupirocin ointment  1 application Nasal BID  . pantoprazole  40 mg Oral Daily  . potassium chloride  40 mEq Oral Daily  . sodium chloride flush  3 mL Intravenous Q12H   Continuous Infusions: . sodium chloride     PRN Meds: sodium chloride, acetaminophen, levalbuterol, metoprolol tartrate, ondansetron **OR** ondansetron (ZOFRAN) IV, sodium chloride flush   Vital Signs    Vitals:   07/30/20 1529 07/30/20 1720 07/30/20 2100 07/31/20 0500  BP:  106/71 103/90 108/78  Pulse:  88 (!) 102 98  Resp:  18 18 18   Temp: 98.3 F (36.8 C) 98.7 F (37.1 C) (!) 97.4 F (36.3 C) 98.6 F (37 C)  TempSrc: Oral Oral    SpO2:  96% 94% 95%  Weight:    94.3 kg  Height:        Intake/Output Summary (Last 24 hours) at 07/31/2020 0932 Last data filed at 07/31/2020 0300 Gross per 24 hour  Intake 240 ml  Output 200 ml  Net 40 ml    Last 3 Weights 07/31/2020 07/30/2020 07/28/2020  Weight (lbs) 207 lb 14.3 oz 205 lb 4 oz 205 lb 4 oz  Weight (kg) 94.3 kg 93.1 kg 93.1 kg      Telemetry    Atrial fibrillation, HR in 100's to 110's with frequent PVC's - Personally Reviewed  ECG   No new tracings.   Physical Exam   General: Well developed, elderly male appearing in no  acute distress. Head: Normocephalic, atraumatic.  Neck: Supple without bruits, JVD not elevated. Lungs:  Resp regular and unlabored, CTA without wheezing or rales. Heart: Irregularly irregular, S1, S2, no S3, S4, or murmur; no rub. Abdomen: Soft, non-tender, non-distended with normoactive bowel sounds. No hepatomegaly. No rebound/guarding. No obvious abdominal masses. Extremities: No clubbing. 2+ pitting edema bilaterally. Distal pedal pulses are 2+ bilaterally. Neuro: Alert and oriented X 2 (person, place). Moves all extremities spontaneously. Psych: Normal affect.  Labs    Chemistry Recent Labs  Lab 07/27/20 1145 07/28/20 0452 07/29/20 0401 07/30/20 0758 07/31/20 0333  NA 137   < > 139 139 139  K 3.6   < > 3.5 4.0 4.0  CL 94*   < > 98 100 99  CO2 32   < > 32 31 31  GLUCOSE 110*   < > 101* 100* 89  BUN 27*   < > 27* 23 25*  CREATININE 0.86   < > 0.81 0.66 0.55*  CALCIUM 8.6*   < > 8.4* 8.5* 8.4*  PROT 5.8*  --   --   --   --  ALBUMIN 3.4*  --   --   --   --   AST 31  --   --   --   --   ALT 28  --   --   --   --   ALKPHOS 83  --   --   --   --   BILITOT 1.0  --   --   --   --   GFRNONAA >60   < > >60 >60 >60  GFRAA >60   < > >60 >60 >60  ANIONGAP 11   < > 9 8 9    < > = values in this interval not displayed.     Hematology Recent Labs  Lab 07/29/20 0401 07/30/20 0758 07/31/20 0333  WBC 8.1 8.0 8.5  RBC 4.58 4.89 4.74  HGB 13.0 13.9 13.5  HCT 42.1 45.9 43.4  MCV 91.9 93.9 91.6  MCH 28.4 28.4 28.5  MCHC 30.9 30.3 31.1  RDW 15.7* 15.9* 15.6*  PLT 145* 142* 158    Cardiac EnzymesNo results for input(s): TROPONINI in the last 168 hours. No results for input(s): TROPIPOC in the last 168 hours.   BNPNo results for input(s): BNP, PROBNP in the last 168 hours.   DDimer No results for input(s): DDIMER in the last 168 hours.   Radiology    No results found.  Cardiac Studies   Limited Echo: 07/2020 IMPRESSIONS    1. Left ventricular ejection fraction, by  estimation, is 60 to 65%. The  left ventricle has normal function. There is mild left ventricular  hypertrophy.  2. Right ventricular systolic function is mildly reduced. The right  ventricular size is moderately enlarged.  3. Left atrial size was moderately dilated.  4. Right atrial size was moderately dilated.  5. The pericardial effusion is circumferential.  6. The aortic valve was not well visualized.  7. Limited echo in the setting of new onset atrial fibrillation  Patient Profile     86 y.o. male w/ PMH of CAD (reported stent placement but details unavailable), HTN, HLD, OSA (not currently on CPAP) and dementia who is currently admitted for new-onset atrial fibrillation with RVR.   Assessment & Plan    1. Persistent Atrial Fibrillation - Unknown duration but was a new diagnosis for the patient this admission. TSH WNL. He did have hypomagnesemia and hypokalemia on admission but both have now been replaced (K+ 4.0 and Mg 2.0 this AM). A rate-control strategy has been recommended by review of notes. Initially required IV Cardizem but has now been transitioned to Toprol-XL. Given rates remain elevated in the low-100's to 110's at rest, will further titrate Toprol-XL from 50mg  daily to 75mg  daily. Can continue to titrate as BP allows but this has been soft at 100/65 - 128/72 within the past 24 hours so doubt BP will tolerate much higher dosing. He has been receiving Midodrine 5mg  TID with Lisinopril being discontinued.   - This patients CHA2DS2-VASc Score and unadjusted Ischemic Stroke Rate (% per year) is equal to 7.2 % stroke rate/year from a score of 5 (HTN, DM, Vascular, Age (2). He has been started on Eliquis 5mg  BID for anticoagulation.   2. Pleural Effusion - He is s/p right-sided thoracentesis on 07/28/2020 with 1280 mL fluid removed.   3. Lower Extremity Edema - He has 2+ pitting edema on examination today and weight is trending up (205 --> 207 lbs). He received IV Lasix on  admission but his last dose was on 9/28.  He was on Torsemide 40mg  daily prior to admission which has not yet been restarted. Would anticipate restarting at a lower dose of 20mg  daily and this can be further titrated as an outpatient if needed.   4. HLD - FLP in 01/2020 showed LDL at 43. He remains on Atorvastatin 20mg  daily.   5. OSA - He has not used his CPAP in over 1 year by review of notes. Residing at the [facility] prior to admission.    For questions or updates, please contact [place] Please consult [email or web address] for contact info under Cardiology/STEMI.   Arna Medici , PA-C 9:32 AM 07/31/2020 Pager: [phone]   Attending note:  Patient seen and examined.  I agree with above assessment by Ms. Strader PA-C.  Patient tolerating Toprol-XL but heart rate control not yet optimal, dose being increased to 75 mg daily.  Systolics have been running 100-120 range.  He does have leg edema with gradually increasing weight, we will plan to repeat restart Demadex although at 20 mg daily.  He is status post right-sided thoracentesis as discussed previously.  Renal function remains stable, creatinine 0.55.  He will otherwise continue on Eliquis for stroke prophylaxis.  Satira Sark, M.D., F.A.C.C.

## 2020-07-31 NOTE — Plan of Care (Signed)

## 2020-08-01 ENCOUNTER — Inpatient Hospital Stay
Admission: RE | Admit: 2020-08-01 | Discharge: 2020-08-26 | Disposition: A | Discharge status: Nursing Home (Includes ICF) | Payer: Medicare Other | Source: Ambulatory Visit | Attending: Internal Medicine | Admitting: Internal Medicine

## 2020-08-01 DIAGNOSIS — J90 Pleural effusion, not elsewhere classified: Principal | ICD-10-CM

## 2020-08-01 LAB — BASIC METABOLIC PANEL
Anion gap: 6 (ref 5–15)
BUN: 24 mg/dL — ABNORMAL HIGH (ref 8–23)
CO2: 33 mmol/L — ABNORMAL HIGH (ref 22–32)
Calcium: 8.5 mg/dL — ABNORMAL LOW (ref 8.9–10.3)
Chloride: 101 mmol/L (ref 98–111)
Creatinine, Ser: 0.76 mg/dL (ref 0.61–1.24)
GFR calc Af Amer: >60 mL/min (ref >60)
GFR calc non Af Amer: >60 mL/min (ref >60)
Glucose, Bld: 89 mg/dL (ref 70–99)
Potassium: 4.7 mmol/L (ref 3.5–5.1)
Sodium: 140 mmol/L (ref 135–145)

## 2020-08-01 LAB — CULTURE, BLOOD (ROUTINE X 2)
Culture: NO GROWTH
Culture: NO GROWTH
Special Requests: ADEQUATE
Special Requests: ADEQUATE

## 2020-08-01 LAB — GLUCOSE, CAPILLARY
Glucose-Capillary: 95 mg/dL (ref 70–99)
Glucose-Capillary: 94 mg/dL (ref 70–99)
Glucose-Capillary: 98 mg/dL (ref 70–99)

## 2020-08-01 LAB — CBC
HCT: 45.8 % (ref 39.0–52.0)
Hemoglobin: 14.3 g/dL (ref 13.0–17.0)
MCH: 28.6 pg (ref 26.0–34.0)
MCHC: 31.2 g/dL (ref 30.0–36.0)
MCV: 91.6 fL (ref 80.0–100.0)
Platelets: 169 10*3/uL (ref 150–400)
RBC: 5 MIL/uL (ref 4.22–5.81)
RDW: 15.9 % — ABNORMAL HIGH (ref 11.5–15.5)
WBC: 7.4 10*3/uL (ref 4.0–10.5)
nRBC: 0 % (ref 0.0–0.2)

## 2020-08-01 LAB — MAGNESIUM: Magnesium: 1.8 mg/dL (ref 1.7–2.4)

## 2020-08-01 MED ORDER — METOPROLOL SUCCINATE ER 25 MG PO TB24: 75.0000 mg | ORAL_TABLET | Freq: Every day | ORAL | Status: DC

## 2020-08-01 MED ORDER — MIDODRINE HCL 5 MG PO TABS: 5.0000 mg | ORAL_TABLET | Freq: Three times a day (TID) | ORAL | Status: DC

## 2020-08-01 MED ORDER — TORSEMIDE 20 MG PO TABS: 20.0000 mg | ORAL_TABLET | Freq: Every day | ORAL | Status: DC

## 2020-08-01 MED ORDER — MELOXICAM 7.5 MG PO TABS: 7.5000 mg | ORAL_TABLET | Freq: Every day | ORAL | Status: DC | PRN

## 2020-08-01 NOTE — Progress Notes (Signed)
PROGRESS NOTE    Eric Bowman  BTD:[id number] DOB: 1933-04-13 DOA: 07/27/2020 PCP: Gerlene Fee, NP   Brief Narrative:  Per HPI: DE JAWORSKI is a 86 y.o. male with medical history significant for dementia, cervical myelopathy with inability to ambulate, CAD, OSA, gout, hyperlipidemia, hypertension, and GERD who was brought from [facility] with some hypoxemia as well as tachycardia and weakness.  His heart rate was approximately 140 bpm and he was noted to be in atrial fibrillation which was new for him.  He was also noted to be tachypneic and has not a known moderate-sized pleural effusion from ultrasound on 9/20, but thoracentesis could not be performed at that time since he could not sit upright.  He was suspected to have pneumonia a few days prior on 9/22 for which he was started on Levaquin intravenously for treatment.  9/28: Patient has been admitted with new onset atrial fibrillation with RVR and remains on Cardizem drip this morning.  Cardiology planning to wean off drip with use of metoprolol.  He has undergone right-sided thoracentesis with 1280 mL removed.  Plans to hold Lasix today given softer blood pressure readings with close monitoring.  9/29: HR better controlled.  Poor appetite with hypoglycemia.   9/30: Pt not eating well but HR is better controlled.   10/1: cardiac medications further titrated  10/2: Pt medically ready for SNF. Awaiting PASSR per social worker  Assessment & Plan:   Active Problems:   Pleural effusion   Atrial fibrillation with RVR (HCC)   S/P thoracentesis  New onset atrial fibrillation with RVR-improving -2D echocardiogram 03/2020 with LVEF 55-60% -Limited 2D echocardiogram with LVEF 60-65% with mild RV dysfunction and moderate biatrial enlargement -TSH 2.3 -Maintain on Cardizem drip for rate control and wean with addition of metoprolol per cardiology on 9/28  -transitioned to toprol XL 9/30  Dose increased to 75 mg daily for better HR  control.  -Continue anticoagulation with Eliquis -Appreciate cardiology evaluation and assistance with rate control  Right-sided pleural effusion status post thoracentesis on 9/28 -1280 mL fluid removed per radiology  HTN -BP controlled on current medication regimen, following.   History of CAD with prior stent -Continue aspirin and statin -No chest pain or signs of ACS noted  History of COPD/pulmonary sarcoid -Breathing treatments with Xopenex as needed with no acute bronchospasms currently noted  History of diabetes -No significant hyperglycemia noted, will try SSI for coverage and carb modified diet -Hemoglobin A1c on 05/2020 5.5%  CBG (last 3)  Recent Labs    07/31/20 2119 08/01/20 0747 08/01/20 1111  GLUCAP 131* 95 94   Hypoglycemia - due to poor oral intake, treated with dextrose and now resolved.   History of dementia -Continue Aricept  History of gout -Continue allopurinol -No acute issues currently  Dyslipidemia -Continue statin  OSA with noncompliance on CPAP -Order CPAP at bedtime  GERD -PPI  Cervical myelopathy -Wheelchair-bound  DVT prophylaxis:Eliquis Code Status: DNR Family Communication: Discussed with wife on phone 9/28, (9/30, 10/1) no answer Disposition Plan:  Status is: Inpatient  Remains inpatient appropriate because:Hemodynamically unstable, IV treatments appropriate due to intensity of illness or inability to take PO and Inpatient level of care appropriate due to severity of illness  Dispo: The patient is from: SNF              Anticipated d/c is to: SNF              Anticipated d/c date is: 1 day  Patient currently is medically stable to d/c.  Awaiting PASSR per Education officer, museum.   Consultants:   Cardiology  Procedures:   See below  Antimicrobials:  Anti-infectives (From admission, onward)   Start     Dose/Rate Route Frequency Ordered Stop   07/28/20 1200  vancomycin (VANCOREADY) IVPB 1500 mg/300 mL   Status:  Discontinued        1,500 mg 150 mL/hr over 120 Minutes Intravenous Every 24 hours 07/27/20 1253 07/27/20 1337   07/27/20 2000  ceFEPIme (MAXIPIME) 2 g in sodium chloride 0.9 % 100 mL IVPB  Status:  Discontinued        2 g 200 mL/hr over 30 Minutes Intravenous Every 8 hours 07/27/20 1248 07/27/20 1337   07/27/20 1100  vancomycin (VANCOCIN) IVPB 1000 mg/200 mL premix  Status:  Discontinued        1,000 mg 200 mL/hr over 60 Minutes Intravenous  Once 07/27/20 1056 07/27/20 1057   07/27/20 1100  ceFEPIme (MAXIPIME) 2 g in sodium chloride 0.9 % 100 mL IVPB        2 g 200 mL/hr over 30 Minutes Intravenous  Once 07/27/20 1056 07/27/20 1302   07/27/20 1100  vancomycin (VANCOREADY) IVPB 2000 mg/400 mL        2,000 mg 200 mL/hr over 120 Minutes Intravenous  Once 07/27/20 1057 07/27/20 1459     Subjective: Patient has no complaints today.   Objective: Vitals:   07/31/20 1500 07/31/20 2042 08/01/20 0500 08/01/20 0533  BP: 117/70   104/74  Pulse: 66   66  Resp: 18   20  Temp: 98.3 F (36.8 C)   97.6 F (36.4 C)  TempSrc: Oral   Oral  SpO2: 99% 92%  98%  Weight:   94.2 kg   Height:        Intake/Output Summary (Last 24 hours) at 08/01/2020 1319 Last data filed at 08/01/2020 0859 Gross per 24 hour  Intake 480 ml  Output --  Net 480 ml   Filed Weights   07/30/20 0500 07/31/20 0500 08/01/20 0500  Weight: 93.1 kg 94.3 kg 94.2 kg    Examination:  General exam: Appears calm and comfortable,NAD Respiratory system: Clear to auscultation. Respiratory effort normal.  Currently on room air. Cardiovascular system: normal S1 & S2 heard, irregular heart rate Gastrointestinal system: Abdomen is nondistended, soft and nontender.  Central nervous system: nonfocal exam.  Extremities: leg edema trace, bilateral Skin: No rashes, lesions or ulcers Psychiatry: Flat affect  Data Reviewed: I have personally reviewed following labs and imaging studies  CBC: Recent Labs  Lab  07/27/20 1145 07/27/20 1145 07/28/20 0452 07/29/20 0401 07/30/20 0758 07/31/20 0333 08/01/20 0725  WBC 8.4   < > 6.9 8.1 8.0 8.5 7.4  NEUTROABS 6.6  --   --   --   --   --   --   HGB 13.6   < > 13.0 13.0 13.9 13.5 14.3  HCT 41.4   < > 40.7 42.1 45.9 43.4 45.8  MCV 90.6   < > 91.9 91.9 93.9 91.6 91.6  PLT 133*   < > 120* 145* 142* 158 169   < > = values in this interval not displayed.   Basic Metabolic Panel: Recent Labs  Lab 07/28/20 0452 07/29/20 0401 07/30/20 0758 07/31/20 0333 08/01/20 0725  NA 140 139 139 139 140  K 3.0* 3.5 4.0 4.0 4.7  CL 95* 98 100 99 101  CO2 35* 32 31 31 33*  GLUCOSE  98 101* 100* 89 89  BUN 22 27* 23 25* 24*  CREATININE 0.67 0.81 0.66 0.55* 0.76  CALCIUM 8.9 8.4* 8.5* 8.4* 8.5*  MG 1.3* 1.8 1.8 2.0 1.8   GFR: Estimated Creatinine Clearance: 77.9 mL/min (by C-G formula based on SCr of 0.76 mg/dL). Liver Function Tests: Recent Labs  Lab 07/27/20 1145  AST 31  ALT 28  ALKPHOS 83  BILITOT 1.0  PROT 5.8*  ALBUMIN 3.4*   No results for input(s): LIPASE, AMYLASE in the last 168 hours. No results for input(s): AMMONIA in the last 168 hours. Coagulation Profile: Recent Labs  Lab 07/27/20 1145  INR 1.9*   Cardiac Enzymes: No results for input(s): CKTOTAL, CKMB, CKMBINDEX, TROPONINI in the last 168 hours. BNP (last 3 results) No results for input(s): PROBNP in the last 8760 hours. HbA1C: No results for input(s): HGBA1C in the last 72 hours. CBG: Recent Labs  Lab 07/31/20 1120 07/31/20 1647 07/31/20 2119 08/01/20 0747 08/01/20 1111  GLUCAP 102* 98 131* 95 94   Lipid Profile: No results for input(s): CHOL, HDL, LDLCALC, TRIG, CHOLHDL, LDLDIRECT in the last 72 hours. Thyroid Function Tests: No results for input(s): TSH, T4TOTAL, FREET4, T3FREE, THYROIDAB in the last 72 hours. Anemia Panel: No results for input(s): VITAMINB12, FOLATE, FERRITIN, TIBC, IRON, RETICCTPCT in the last 72 hours. Sepsis Labs: Recent Labs  Lab  07/27/20 1145 07/27/20 1420 07/29/20 0401  PROCALCITON  --  <0.10  --   LATICACIDVEN 1.5 1.9 1.3    Recent Results (from the past 240 hour(s))  Urine culture     Status: None   Collection Time: 07/27/20 10:57 AM   Specimen: In/Out Cath Urine  Result Value Ref Range Status   Specimen Description   Final    IN/OUT CATH URINE Performed at [facility], [address] [place], [place] [address]    Special Requests   Final    NONE Performed at [facility], [address] [place], [place] [address]    Culture   Final    NO GROWTH Performed at [facility], [place] [address], [place], [place] [address]    Report Status 07/28/2020 FINAL  Final  Respiratory Panel by RT PCR (Flu A&B, Covid) - Nasopharyngeal Swab     Status: None   Collection Time: 07/27/20 11:00 AM   Specimen: Nasopharyngeal Swab  Result Value Ref Range Status   SARS Coronavirus 2 by RT PCR NEGATIVE NEGATIVE Final    Comment: (NOTE) SARS-CoV-2 target nucleic acids are NOT DETECTED.  The SARS-CoV-2 RNA is generally detectable in upper respiratoy specimens during the acute phase of infection. The lowest concentration of SARS-CoV-2 viral copies this assay can detect is 131 copies/mL. A negative result does not preclude SARS-Cov-2 infection and should not be used as the sole basis for treatment or other patient management decisions. A negative result may occur with  improper specimen collection/handling, submission of specimen other than nasopharyngeal swab, presence of viral mutation(s) within the areas targeted by this assay, and inadequate number of viral copies (<131 copies/mL). A negative result must be combined with clinical observations, patient history, and epidemiological information. The expected result is Negative.  Fact Sheet for Patients:  PinkCheek.be  Fact Sheet for Healthcare Providers:  GravelBags.it  This test is no t yet approved or  cleared by the [place] FDA and  has been authorized for detection and/or diagnosis of SARS-CoV-2 by FDA under an Emergency Use Authorization (EUA). This EUA will remain  in effect (  meaning this test can be used) for the duration of the COVID-19 declaration under Section 564(b)(1) of the Act, 21 U.S.C. section 360bbb-3(b)(1), unless the authorization is terminated or revoked sooner.     Influenza A by PCR NEGATIVE NEGATIVE Final   Influenza B by PCR NEGATIVE NEGATIVE Final    Comment: (NOTE) The Xpert Xpress SARS-CoV-2/FLU/RSV assay is intended as an aid in  the diagnosis of influenza from Nasopharyngeal swab specimens and  should not be used as a sole basis for treatment. Nasal washings and  aspirates are unacceptable for Xpert Xpress SARS-CoV-2/FLU/RSV  testing.  Fact Sheet for Patients: PinkCheek.be  Fact Sheet for Healthcare Providers: GravelBags.it  This test is not yet approved or cleared by the [place] FDA and  has been authorized for detection and/or diagnosis of SARS-CoV-2 by  FDA under an Emergency Use Authorization (EUA). This EUA will remain  in effect (meaning this test can be used) for the duration of the  Covid-19 declaration under Section 564(b)(1) of the Act, 21  U.S.C. section 360bbb-3(b)(1), unless the authorization is  terminated or revoked. Performed at [facility], [address], [place], [place] [address]   Blood Culture (routine x 2)     Status: None   Collection Time: 07/27/20 11:44 AM   Specimen: BLOOD LEFT FOREARM  Result Value Ref Range Status   Specimen Description BLOOD LEFT FOREARM  Final   Special Requests   Final    BOTTLES DRAWN AEROBIC AND ANAEROBIC Blood Culture adequate volume   Culture   Final    NO GROWTH 5 DAYS Performed at [facility], [address], [place], [place] [address]    Report Status 08/01/2020 FINAL  Final  Blood Culture (routine x 2)     Status: None    Collection Time: 07/27/20 11:52 AM   Specimen: BLOOD RIGHT FOREARM  Result Value Ref Range Status   Specimen Description BLOOD RIGHT FOREARM  Final   Special Requests   Final    BOTTLES DRAWN AEROBIC AND ANAEROBIC Blood Culture adequate volume   Culture   Final    NO GROWTH 5 DAYS Performed at [facility], [address] [place], [place] [address]    Report Status 08/01/2020 FINAL  Final  MRSA PCR Screening     Status: Abnormal   Collection Time: 07/27/20 12:54 PM   Specimen: Nasopharyngeal  Result Value Ref Range Status   MRSA by PCR POSITIVE (A) NEGATIVE Final    Comment:        The GeneXpert MRSA Assay (FDA approved for NASAL specimens only), is one component of a comprehensive MRSA colonization surveillance program. It is not intended to diagnose MRSA infection nor to guide or monitor treatment for MRSA infections. RESULT CALLED TO, READ BACK BY AND VERIFIED WITH: HILTON, LAWERENCE @0742  ON 07/28/20 BY JONES,T Performed at [facility], [address] [place], [place] [address]   Gram stain     Status: None   Collection Time: 07/28/20 11:25 AM   Specimen: Pleura  Result Value Ref Range Status   Specimen Description PLEURAL  Final   Special Requests NONE  Final   Gram Stain   Final    CYTOSPIN SMEAR NO ORGANISMS SEEN WBC PRESENT,BOTH PMN AND MONONUCLEAR Performed at [facility], [address] [place], [place] [address]    Report Status 07/28/2020 FINAL  Final  Culture, body fluid-bottle     Status: None (Preliminary result)   Collection Time: 07/28/20 11:25 AM   Specimen: Pleura  Result Value Ref Range Status   Specimen Description PLEURAL  Final   Special Requests 10CC  Final   Culture   Final    NO GROWTH 4 DAYS Performed at [facility], [address] [place], [place] [address]    Report Status PENDING  Incomplete    Radiology Studies: DG Swallowing Func-Speech Pathology  Result Date: 07/31/2020 Objective Swallowing Evaluation: Type of Study:  MBS-Modified Barium Swallow Study  Patient Details Name: JERRIE GULLO MRN: [id number] Date of Birth: [birth year] Today's Date: 07/31/2020 Time: SLP Start Time (ACUTE ONLY): 1015 -SLP Stop Time (ACUTE ONLY): 1042 SLP Time Calculation (min) (ACUTE ONLY): 27 min Past Medical History: Past Medical History: Diagnosis Date . Cancer (HCC)   melanoma-left side . Chronic pain   legs and feet . Coronary artery disease  . Dementia ([place])  . Diabetes mellitus without complication ([place])  . Foot drop  . GERD (gastroesophageal reflux disease)  . Gout  . Hyperlipidemia  . Hypertension  . OCD (obsessive compulsive disorder)  . PONV (postoperative nausea and vomiting)  . Psychosis ([place])  . Reflux  . Sarcoidosis  . Sleep apnea   cpap Past Surgical History: Past Surgical History: Procedure Laterality Date . BACK SURGERY    cervical neck . CATARACT EXTRACTION W/PHACO  01/05/2012  Procedure: CATARACT EXTRACTION PHACO AND INTRAOCULAR LENS PLACEMENT (IOC);  Surgeon: [name] Branch, MD;  Location: [facility];  Service: Ophthalmology;  Laterality: Left;  CDE 18.47 . CATARACT EXTRACTION W/PHACO  02/02/2012  Procedure: CATARACT EXTRACTION PHACO AND INTRAOCULAR LENS PLACEMENT (IOC);  Surgeon: [name] Branch, MD;  Location: [facility];  Service: Ophthalmology;  Laterality: Right;  CDE:15.38 . CHOLECYSTECTOMY   . GALLBLADDER SURGERY   . hemorhoidectomy   . MELANOMA EXCISION    left side-[name] . TONSILLECTOMY   HPI: DARRY KELNHOFER is a 86 y.o. male with medical history significant for dementia, cervical myelopathy with inability to ambulate, CAD, OSA, gout, hyperlipidemia, hypertension, and GERD who was brought from [facility] with some hypoxemia as well as tachycardia and weakness.  His heart rate was approximately 140 bpm and he was noted to be in atrial fibrillation which was new for him.  He was also noted to be tachypneic and has not a known moderate-sized pleural effusion from ultrasound on 9/20, but thoracentesis could not be performed at that time since he  could not sit upright.  He was suspected to have pneumonia a few days prior on 9/22 for which he was started on Levaquin intravenously for treatment. Right-sided pleural effusion status post thoracentesis on 9/28. BSE requested 07/29/20.  Subjective: Pt was very alert and responsive today Assessment / Plan / Recommendation CHL IP CLINICAL IMPRESSIONS 07/31/2020 Clinical Impression Pt presents with moderate oropharyngeal dysphagia characterized by consistent silent aspiration (one or two of multiple episodes were sensed with a reflexive cough) of thin liquids (with all presentations - tsp, cup and straw) as a result of poor laryngeal vestibule closure (LVC). Occasional premature spillage of thin liquids is immediately aspirated before the swallow. With all cup and straw sips of thin liquids Pt aspirated trace to moderate amount of thin liquid during the swallow. Further note, decreased LVC is specifically characterized by decreased arytenoid adduction, decreased epiglottic deflection, and decreased pharyngeal constriction. Pt was congnitively unable to consistently complete chin tuck maneuver. NTL were consumed with visualized flash penetration and no aspiration. No penetration or aspiration was visualized with HTL trials however note increased pharyngeal residuals. Pt consumed puree textures with trace vallecuale residuals  that were cleared with cued repeat swallow. It is possible and important to note that thickened liquids and/or puree texture residuals will mix with secretions and eventually be aspirated in small amounts.  Recommend continue with D1/puree diet and NECTAR THICK liquids with meds crushed in puree. ST will continue to follow acutely. Recommend ST f/u at SNF for diet tolerance and dysphagia therapy where Pt is a resident and recommend consider repeat MBSS when clinically appropriate.  SLP Visit Diagnosis Dysphagia, unspecified (R13.10) Attention and concentration deficit following -- Frontal lobe and  executive function deficit following -- Impact on safety and function Mild aspiration risk;Moderate aspiration risk   CHL IP TREATMENT RECOMMENDATION 07/31/2020 Treatment Recommendations Therapy as outlined in treatment plan below   Prognosis 07/31/2020 Prognosis for Safe Diet Advancement Fair Barriers to Reach Goals Cognitive deficits Barriers/Prognosis Comment -- CHL IP DIET RECOMMENDATION 07/31/2020 SLP Diet Recommendations Dysphagia 1 (Puree) solids;Nectar thick liquid Liquid Administration via Cup Medication Administration Crushed with puree Compensations Minimize environmental distractions;Slow rate;Follow solids with liquid Postural Changes Remain semi-upright after after feeds/meals (Comment);Seated upright at 90 degrees   CHL IP OTHER RECOMMENDATIONS 07/31/2020 Recommended Consults -- Oral Care Recommendations Oral care BID Other Recommendations Order thickener from pharmacy   CHL IP FOLLOW UP RECOMMENDATIONS 07/31/2020 Follow up Recommendations Skilled Nursing facility;24 hour supervision/assistance   CHL IP FREQUENCY AND DURATION 07/31/2020 Speech Therapy Frequency (ACUTE ONLY) min 1 x/week Treatment Duration 1 week      CHL IP ORAL PHASE 07/31/2020 Oral Phase Impaired Oral - Pudding Teaspoon -- Oral - Pudding Cup -- Oral - Honey Teaspoon -- Oral - Honey Cup -- Oral - Nectar Teaspoon -- Oral - Nectar Cup -- Oral - Nectar Straw -- Oral - Thin Teaspoon Premature spillage Oral - Thin Cup Premature spillage Oral - Thin Straw Premature spillage Oral - Puree -- Oral - Mech Soft -- Oral - Regular -- Oral - Multi-Consistency -- Oral - Pill -- Oral Phase - Comment --  CHL IP PHARYNGEAL PHASE 07/31/2020 Pharyngeal Phase Impaired Pharyngeal- Pudding Teaspoon -- Pharyngeal -- Pharyngeal- Pudding Cup -- Pharyngeal -- Pharyngeal- Honey Teaspoon -- Pharyngeal -- Pharyngeal- Honey Cup Pharyngeal residue - valleculae;Pharyngeal residue - pyriform;Reduced epiglottic inversion;Reduced pharyngeal peristalsis;Reduced laryngeal  elevation;Reduced tongue base retraction;Reduced airway/laryngeal closure Pharyngeal -- Pharyngeal- Nectar Teaspoon -- Pharyngeal -- Pharyngeal- Nectar Cup Reduced pharyngeal peristalsis;Reduced epiglottic inversion;Reduced airway/laryngeal closure;Reduced tongue base retraction;Penetration/Aspiration during swallow;Pharyngeal residue - valleculae;Pharyngeal residue - pyriform Pharyngeal Material enters airway, remains ABOVE vocal cords then ejected out Pharyngeal- Nectar Straw -- Pharyngeal -- Pharyngeal- Thin Teaspoon Reduced pharyngeal peristalsis;Reduced epiglottic inversion;Reduced airway/laryngeal closure;Reduced tongue base retraction;Penetration/Aspiration during swallow;Penetration/Aspiration before swallow;Moderate aspiration;Trace aspiration;Pharyngeal residue - valleculae;Pharyngeal residue - pyriform Pharyngeal Material enters airway, passes BELOW cords without attempt by patient to eject out (silent aspiration);Material enters airway, passes BELOW cords and not ejected out despite cough attempt by patient Pharyngeal- Thin Cup Reduced pharyngeal peristalsis;Reduced epiglottic inversion;Reduced airway/laryngeal closure;Reduced tongue base retraction;Penetration/Aspiration during swallow;Penetration/Aspiration before swallow;Moderate aspiration;Trace aspiration;Pharyngeal residue - valleculae;Pharyngeal residue - pyriform Pharyngeal Material enters airway, passes BELOW cords without attempt by patient to eject out (silent aspiration);Material enters airway, passes BELOW cords and not ejected out despite cough attempt by patient Pharyngeal- Thin Straw Reduced pharyngeal peristalsis;Reduced epiglottic inversion;Reduced airway/laryngeal closure;Reduced tongue base retraction;Penetration/Aspiration during swallow;Penetration/Aspiration before swallow;Moderate aspiration;Trace aspiration;Pharyngeal residue - valleculae;Pharyngeal residue - pyriform Pharyngeal Material enters airway, passes BELOW cords without  attempt by patient to eject out (silent aspiration);Material enters airway, passes BELOW cords and not ejected out despite cough attempt by patient Pharyngeal- Puree  Pharyngeal residue - valleculae;Pharyngeal residue - pyriform;Reduced epiglottic inversion Pharyngeal -- Pharyngeal- Mechanical Soft -- Pharyngeal -- Pharyngeal- Regular (No Data) Pharyngeal -- Pharyngeal- Multi-consistency -- Pharyngeal -- Pharyngeal- Pill -- Pharyngeal -- Pharyngeal Comment --  CHL IP CERVICAL ESOPHAGEAL PHASE 07/31/2020 Cervical Esophageal Phase WFL Pudding Teaspoon -- Pudding Cup -- Honey Teaspoon -- Honey Cup -- Nectar Teaspoon -- Nectar Cup -- Nectar Straw -- Thin Teaspoon -- Thin Cup -- Thin Straw -- Puree -- Mechanical Soft -- Regular -- Multi-consistency -- Pill -- Cervical Esophageal Comment -- Amelia H. Roddie Mc, CCC-SLP Speech Language Pathologist Wende Bushy 07/31/2020, 11:34 AM              Scheduled Meds: . allopurinol  300 mg Oral Daily  . apixaban  5 mg Oral BID  . atorvastatin  20 mg Oral QPM  . donepezil  10 mg Oral QHS  . gabapentin  100 mg Oral QHS  . insulin aspart  0-5 Units Subcutaneous QHS  . insulin aspart  0-9 Units Subcutaneous TID WC  . metoprolol succinate  75 mg Oral Daily  . midodrine  5 mg Oral TID WC  . mupirocin ointment  1 application Nasal BID  . pantoprazole  40 mg Oral Daily  . potassium chloride  40 mEq Oral Daily  . sodium chloride flush  3 mL Intravenous Q12H  . torsemide  20 mg Oral Daily   Continuous Infusions: . sodium chloride      LOS: 5 days   Irwin Brakeman, MD How to contact the [facility] Attending or Consulting provider [place] or covering provider during after hours [place], for this patient?  1. Check the care team in [facility] and look for a) attending/consulting TR[facility] provider listed and b) the [facility] team listed 2. Log into [email or web address] and use [place]'s universal password to access. If you do not have the password, please contact the hospital  operator. 3. Locate the [facility] provider you are looking for under [facility] and page to a number that you can be directly reached. 4. If you still have difficulty reaching the provider, please page the [facility] (Director on Call) for the Hospitalists listed on amion for assistance.  [facility]  If 7PM-7AM, please contact night-coverage [email or web address] 08/01/2020, 1:19 PM

## 2020-08-01 NOTE — Discharge Instructions (Signed)
Atrial Fibrillation  Atrial fibrillation is a type of heartbeat that is irregular or fast. If you have this condition, your heart beats without any order. This makes it hard for your heart to pump blood in a normal way. Atrial fibrillation may come and go, or it may become a long-lasting problem. If this condition is not treated, it can put you at higher risk for stroke, heart failure, and other heart problems. What are the causes? This condition may be caused by diseases that damage the heart. They include:  High blood pressure.  Heart failure.  Heart valve disease.  Heart surgery. Other causes include:  Diabetes.  Thyroid disease.  Being overweight.  Kidney disease. Sometimes the cause is not known. What increases the risk? You are more likely to develop this condition if:  You are older.  You smoke.  You exercise often and very hard.  You have a family history of this condition.  You are a man.  You use drugs.  You drink a lot of alcohol.  You have lung conditions, such as emphysema, pneumonia, or COPD.  You have sleep apnea. What are the signs or symptoms? Common symptoms of this condition include:  A feeling that your heart is beating very fast.  Chest pain or discomfort.  Feeling short of breath.  Suddenly feeling light-headed or weak.  Getting tired easily during activity.  Fainting.  Sweating. In some cases, there are no symptoms. How is this treated? Treatment for this condition depends on underlying conditions and how you feel when you have atrial fibrillation. They include:  Medicines to: ? Prevent blood clots. ? Treat heart rate or heart rhythm problems.  Using devices, such as a pacemaker, to correct heart rhythm problems.  Doing surgery to remove the part of the heart that sends bad signals.  Closing an area where clots can form in the heart (left atrial appendage). In some cases, your doctor will treat other underlying  conditions. Follow these instructions at home: Medicines  Take over-the-counter and prescription medicines only as told by your doctor.  Do not take any new medicines without first talking to your doctor.  If you are taking blood thinners: ? Talk with your doctor before you take any medicines that have aspirin or NSAIDs, such as ibuprofen, in them. ? Take your medicine exactly as told by your doctor. Take it at the same time each day. ? Avoid activities that could hurt or bruise you. Follow instructions about how to prevent falls. ? Wear a bracelet that says you are taking blood thinners. Or, carry a card that lists what medicines you take. Lifestyle      Do not use any products that have nicotine or tobacco in them. These include cigarettes, e-cigarettes, and chewing tobacco. If you need help quitting, ask your doctor.  Eat heart-healthy foods. Talk with your doctor about the right eating plan for you.  Exercise regularly as told by your doctor.  Do not drink alcohol.  Lose weight if you are overweight.  Do not use drugs, including cannabis. General instructions  If you have a condition that causes breathing to stop for a short period of time (apnea), treat it as told by your doctor.  Keep a healthy weight. Do not use diet pills unless your doctor says they are safe for you. Diet pills may make heart problems worse.  Keep all follow-up visits as told by your doctor. This is important. Contact a doctor if:  You notice a change   in the speed, rhythm, or strength of your heartbeat.  You are taking a blood-thinning medicine and you get more bruising.  You get tired more easily when you move or exercise.  You have a sudden change in weight. Get help right away if:   You have pain in your chest or your belly (abdomen).  You have trouble breathing.  You have side effects of blood thinners, such as blood in your vomit, poop (stool), or pee (urine), or bleeding that cannot  stop.  You have any signs of a stroke. "BE FAST" is an easy way to remember the main warning signs: ? B - Balance. Signs are dizziness, sudden trouble walking, or loss of balance. ? E - Eyes. Signs are trouble seeing or a change in how you see. ? F - Face. Signs are sudden weakness or loss of feeling in the face, or the face or eyelid drooping on one side. ? A - Arms. Signs are weakness or loss of feeling in an arm. This happens suddenly and usually on one side of the body. ? S - Speech. Signs are sudden trouble speaking, slurred speech, or trouble understanding what people say. ? T - Time. Time to call emergency services. Write down what time symptoms started.  You have other signs of a stroke, such as: ? A sudden, very bad headache with no known cause. ? Feeling like you may vomit (nausea). ? Vomiting. ? A seizure. These symptoms may be an emergency. Do not wait to see if the symptoms will go away. Get medical help right away. Call your local emergency services (911 in the [place] Do not drive yourself to the hospital. Summary  Atrial fibrillation is a type of heartbeat that is irregular or fast.  You are at higher risk of this condition if you smoke, are older, have diabetes, or are overweight.  Follow your doctor's instructions about medicines, diet, exercise, and follow-up visits.  Get help right away if you have signs or symptoms of a stroke.  Get help right away if you cannot catch your breath, or you have chest pain or discomfort. This information is not intended to replace advice given to you by your health care provider. Make sure you discuss any questions you have with your health care provider. Document Revised: 04/10/2019 Document Reviewed: 04/10/2019 Elsevier Patient Education  [place].   IMPORTANT INFORMATION: PAY CLOSE ATTENTION   PHYSICIAN DISCHARGE INSTRUCTIONS  Follow with Primary care provider  Gerlene Fee, NP  and other consultants as instructed  by your Hospitalist Physician  [place] IF SYMPTOMS COME BACK, WORSEN OR NEW PROBLEM DEVELOPS   Please note: You were cared for by a hospitalist during your hospital stay. Every effort will be made to forward records to your primary care provider.  You can request that your primary care provider send for your hospital records if they have not received them.  Once you are discharged, your primary care physician will handle any further medical issues. Please note that NO REFILLS for any discharge medications will be authorized once you are discharged, as it is imperative that you return to your primary care physician (or establish a relationship with a primary care physician if you do not have one) for your post hospital discharge needs so that they can reassess your need for medications and monitor your lab values.  Please get a complete blood count and chemistry panel checked by your Primary MD at  your next visit, and again as instructed by your Primary MD.  Get Medicines reviewed and adjusted: Please take all your medications with you for your next visit with your Primary MD  Laboratory/radiological data: Please request your Primary MD to go over all hospital tests and procedure/radiological results at the follow up, please ask your primary care provider to get all Hospital records sent to his/her office.  In some cases, they will be blood work, cultures and biopsy results pending at the time of your discharge. Please request that your primary care provider follow up on these results.  If you are diabetic, please bring your blood sugar readings with you to your follow up appointment with primary care.    Please call and make your follow up appointments as soon as possible.    Also Note the following: If you experience worsening of your admission symptoms, develop shortness of breath, life threatening emergency, suicidal or homicidal thoughts you must seek  medical attention immediately by calling 911 or calling your MD immediately  if symptoms less severe.  You must read complete instructions/literature along with all the possible adverse reactions/side effects for all the Medicines you take and that have been prescribed to you. Take any new Medicines after you have completely understood and accpet all the possible adverse reactions/side effects.   Do not drive when taking Pain medications or sleeping medications (Benzodiazepines)  Do not take more than prescribed Pain, Sleep and Anxiety Medications. It is not advisable to combine anxiety,sleep and pain medications without talking with your primary care practitioner  Special Instructions: If you have smoked or chewed Tobacco  in the last 2 yrs please stop smoking, stop any regular Alcohol  and or any Recreational drug use.  Wear Seat belts while driving.  Do not drive if taking any narcotic, mind altering or controlled substances or recreational drugs or alcohol.

## 2020-08-01 NOTE — TOC Transition Note (Addendum)
Transition of Care [facility]) - CM/SW Discharge Note   Patient Details  Name: Eric Bowman MRN: [id number] Date of Birth: 12-17-1932  [facility] CM/SW Contact:  Natasha Bence, LCSW Phone Number: 08/01/2020, 2:55 PM   Clinical Narrative:    CSW attempted contact with [facility]'s Vernie Ammons and the [employer] station. CSW not able to reach rep from [facility]. Tim the [facility] with Forestine Na was able to reach the charge nurse with [facility]. Per Jacki [facility] center will be able to take patient. Nurse to call report. [facility] to provide transport. TOC signing off.  [place] requested d/c summary to be faxed. CSW faxed d/c summary to the number provided. [phone])  Final next level of care: [place] Barriers to Discharge: Barriers Resolved   Patient Goals and CMS Choice     Choice offered to / list presented to : Patient  Discharge Placement   Existing PASRR number confirmed : 08/01/20          Patient chooses bed at: [facility] Patient to be transferred to facility by: [facility] Name of family member notified: Shugrue,Doris Patient and family notified of of transfer: 08/01/20  Discharge Plan and Services                                     Social Determinants of Health (SDOH) Interventions     Readmission Risk Interventions Readmission Risk Prevention Plan 08/01/2020  Post Dischage Appt Complete  Medication Screening Complete  Transportation Screening Complete  Some recent data might be hidden

## 2020-08-01 NOTE — Discharge Summary (Signed)
Physician Discharge Summary  KAYDE WAREHIME PXT:[id number] DOB: 09-Jul-1933 DOA: 07/27/2020  PCP: Gerlene Fee, NP Cardiologist: Branch  Admit date: 07/27/2020 Discharge date: 08/01/2020  Admitted From:  SNF  Disposition:  SNF   Recommendations  1. Follow up with cardiology in 3 weeks 2. Please obtain BMP in 1 week to follow electrolytes  Discharge Condition: STABLE   CODE STATUS: DNR    Brief Hospitalization Summary: Please see all hospital notes, images, labs for full details of the hospitalization. Per HPI: Hutch E Meacheris a 86 y.o.malewith medical history significant fordementia, cervical myelopathy with inability to ambulate, CAD, OSA, gout, hyperlipidemia, hypertension, and GERD who was brought from [facility] with some hypoxemia as well as tachycardia and weakness. His heart rate was approximately 140 bpm and he was noted to be in atrial fibrillation which was new for him. He was also noted to be tachypneic and has not a known moderate-sized pleural effusion from ultrasound on 9/20, but thoracentesis could not be performed at that time since he could not sit upright. He was suspected to have pneumonia a few days prior on 9/22 for which he was started on Levaquin intravenously for treatment.  9/28: Patient has been admitted with new onset atrial fibrillation with RVR and remains on Cardizem drip this morning.  Cardiology planning to wean off drip with use of metoprolol.  He has undergone right-sided thoracentesis with 1280 mL removed.  Plans to hold Lasix today given softer blood pressure readings with close monitoring.  9/29: HR better controlled.  Poor appetite with hypoglycemia.   9/30: Pt not eating well but HR is better controlled.   10/1: cardiac medications further titrated  10/2: Pt medically ready for SNF.  Assessment & Plan:   Active Problems:   Pleural effusion   Atrial fibrillation with RVR   S/P thoracentesis  New onset atrial fibrillation with  RVR - controlled now  -2D echocardiogram 03/2020 with LVEF 55-60% -Limited 2D echocardiogram with LVEF 60-65% with mild RV dysfunction and moderate biatrial enlargement -TSH 2.3 -Maintain on Cardizem drip for rate control and wean with addition of metoprolol per cardiology on 9/28  -transitioned to toprol XL 9/30  Dose increased to 75 mg daily for better HR control. HR is much better controlled. BP stable and well controlled.  Continue current dosing.  -Continue anticoagulation with Eliquis -Appreciate cardiology evaluation and assistance with rate control  Right-sided pleural effusion status post thoracentesis on 9/28 -1280 mL fluid removed per radiology  HTN -BP controlled on current medication regimen, following.   History of CAD with prior stent -Continue statin -No chest pain or signs of ACS noted  History of COPD/pulmonary sarcoid -Breathing treatments with Xopenex as needed with no acute bronchospasms currently noted  History of diabetes -No significant hyperglycemia noted, will try SSI for coverage and carb modified diet -Hemoglobin A1c on 05/2020 5.5%  CBG (last 3)  Recent Labs (last 2 labs)        Recent Labs    07/31/20 2119 08/01/20 0747 08/01/20 1111  GLUCAP 131* 95 94     Hypoglycemia - due to poor oral intake, treated with dextrose and now resolved.   History of dementia -Continue Aricept  History of gout -Continue allopurinol -No acute issues currently  Dyslipidemia -Continue statin  OSA with noncompliance on CPAP -Order CPAP at bedtime  GERD -PPI  Cervical myelopathy -Wheelchair-bound  DVT prophylaxis:Eliquis Code Status: DNR Family Communication: Discussed with wife on phone 9/28, (9/30, 10/1) no answer Disposition Plan:  SNF  Status is: Inpatient  Discharge Diagnoses:  Active Problems:   Pleural effusion   Atrial fibrillation with RVR (HCC)   S/P thoracentesis   Discharge Instructions:  Allergies as of 08/01/2020       Reactions   Other    Penicillin G    Sulfonic Acid (3,5-dibromo-4-h-ox-benz)    Penicillins Rash   Sulfonamide Derivatives Rash   Tetanus Toxoid Rash      Medication List    STOP taking these medications   aspirin EC 81 MG tablet   levofloxacin 25 MG/ML solution Commonly known as: LEVAQUIN   levofloxacin 750 MG/150ML Soln Commonly known as: LEVAQUIN   lisinopril 5 MG tablet Commonly known as: ZESTRIL     TAKE these medications   acetaminophen 500 MG tablet Commonly known as: TYLENOL Take 500 mg by mouth every 6 (six) hours as needed. For pain   allopurinol 300 MG tablet Commonly known as: ZYLOPRIM Take 300 mg by mouth daily.   atorvastatin 20 MG tablet Commonly known as: LIPITOR Take 20 mg by mouth every evening.   donepezil 10 MG tablet Commonly known as: ARICEPT Take 10 mg by mouth at bedtime.   Eliquis 5 MG Tabs tablet Generic drug: apixaban Take 5 mg by mouth 2 (two) times daily.   gabapentin 100 MG capsule Commonly known as: NEURONTIN Take 100 mg by mouth at bedtime.   ipratropium-albuterol 0.5-2.5 (3) MG/3ML Soln Commonly known as: DUONEB Take 3 mLs by nebulization every 6 (six) hours as needed.   meloxicam 7.5 MG tablet Commonly known as: MOBIC Take 1 tablet (7.5 mg total) by mouth daily as needed for pain. What changed:   when to take this  reasons to take this   metoprolol succinate 25 MG 24 hr tablet Commonly known as: TOPROL-XL Take 3 tablets (75 mg total) by mouth daily. Start taking on: August 02, 2020   midodrine 5 MG tablet Commonly known as: PROAMATINE Take 1 tablet (5 mg total) by mouth 3 (three) times daily with meals.   pantoprazole 40 MG tablet Commonly known as: PROTONIX Take 1 tablet (40 mg total) by mouth daily.   potassium chloride SA 20 MEQ tablet Commonly known as: KLOR-CON Take 20 mEq by mouth daily.   torsemide 20 MG tablet Commonly known as: DEMADEX Take 1 tablet (20 mg total) by mouth daily. What  changed: how much to take   Venelex Oint Special Instructions: Apply to sacrum/coccyx and bilateral buttocks qshift prn erythema.   vitamin A & D ointment 3 (three) times daily as needed for dry skin. Apply A&D ointment to left heel qshift & prn for erythema.       Follow-up Information    Gerlene Fee, NP .   Specialty: Geriatric Medicine Contact information: [place] [address] [phone]        Arnoldo Lenis, MD. Schedule an appointment as soon as possible for a visit in 3 week(s).   Specialty: Cardiology Why: Hospital Follow Up  Contact information: [place] [address] [phone]              Allergies  Allergen Reactions  . Other   . Penicillin G   . Sulfonic Acid (3,5-Dibromo-4-H-Ox-Benz)   . Penicillins Rash  . Sulfonamide Derivatives Rash  . Tetanus Toxoid Rash   Allergies as of 08/01/2020      Reactions   Other    Penicillin G    Sulfonic Acid (3,5-dibromo-4-h-ox-benz)  Penicillins Rash   Sulfonamide Derivatives Rash   Tetanus Toxoid Rash      Medication List    STOP taking these medications   aspirin EC 81 MG tablet   levofloxacin 25 MG/ML solution Commonly known as: LEVAQUIN   levofloxacin 750 MG/150ML Soln Commonly known as: LEVAQUIN   lisinopril 5 MG tablet Commonly known as: ZESTRIL     TAKE these medications   acetaminophen 500 MG tablet Commonly known as: TYLENOL Take 500 mg by mouth every 6 (six) hours as needed. For pain   allopurinol 300 MG tablet Commonly known as: ZYLOPRIM Take 300 mg by mouth daily.   atorvastatin 20 MG tablet Commonly known as: LIPITOR Take 20 mg by mouth every evening.   donepezil 10 MG tablet Commonly known as: ARICEPT Take 10 mg by mouth at bedtime.   Eliquis 5 MG Tabs tablet Generic drug: apixaban Take 5 mg by mouth 2 (two) times daily.   gabapentin 100 MG capsule Commonly known as: NEURONTIN Take 100 mg by mouth at bedtime.    ipratropium-albuterol 0.5-2.5 (3) MG/3ML Soln Commonly known as: DUONEB Take 3 mLs by nebulization every 6 (six) hours as needed.   meloxicam 7.5 MG tablet Commonly known as: MOBIC Take 1 tablet (7.5 mg total) by mouth daily as needed for pain. What changed:   when to take this  reasons to take this   metoprolol succinate 25 MG 24 hr tablet Commonly known as: TOPROL-XL Take 3 tablets (75 mg total) by mouth daily. Start taking on: August 02, 2020   midodrine 5 MG tablet Commonly known as: PROAMATINE Take 1 tablet (5 mg total) by mouth 3 (three) times daily with meals.   pantoprazole 40 MG tablet Commonly known as: PROTONIX Take 1 tablet (40 mg total) by mouth daily.   potassium chloride SA 20 MEQ tablet Commonly known as: KLOR-CON Take 20 mEq by mouth daily.   torsemide 20 MG tablet Commonly known as: DEMADEX Take 1 tablet (20 mg total) by mouth daily. What changed: how much to take   Venelex Oint Special Instructions: Apply to sacrum/coccyx and bilateral buttocks qshift prn erythema.   vitamin A & D ointment 3 (three) times daily as needed for dry skin. Apply A&D ointment to left heel qshift & prn for erythema.       Procedures/Studies: DG Chest 2 View  Result Date: 07/20/2020 CLINICAL DATA:  Shortness of breath EXAM: CHEST - 2 VIEW COMPARISON:  June 19, 2019 FINDINGS: There is opacification of most of the right hemithorax with combination of pleural effusion and consolidation. There is consolidation in the medial left base. Heart is borderline enlarged. The pulmonary vascularity on the left peers normal. Pulmonary vascular on the right is obscured. No adenopathy seen on the left. Most of the right hilum and mediastinum is obscured by infiltrate and fluid. Postoperative change noted in the lower cervical spine. There are foci of calcification in the left carotid artery. IMPRESSION: Opacification of most of the right hemithorax due to a combination of pleural  effusion and consolidation. There is also consolidation in the medial left base. Heart borderline enlarged. There are foci of left carotid artery calcification. Electronically Signed   By: Lowella Grip III M.D.   On: 07/20/2020 12:22   [place] CHEST (PLEURAL EFFUSION)  Result Date: 07/20/2020 CLINICAL DATA:  Pleural effusion. EXAM: CHEST ULTRASOUND COMPARISON:  Same day. FINDINGS: Moderate size right pleural effusion is noted with a mild amount of atelectatic lung seen in the right  lung base. This exam was performed in preparation for thoracentesis. However, despite multiple people attempting to hold him up, the patient could not be placed in an upright position for an adequate time to perform thoracentesis. Therefore the procedure was not performed. IMPRESSION: Moderate size right pleural effusion is noted with a mild amount of atelectatic lung seen in the right lung base. This exam was performed in preparation for thoracentesis, which could not be performed as the patient could not stay in upright position despite multiple people attempting to hold him up. Electronically Signed   By: Marijo Conception M.D.   On: 07/20/2020 14:44   DG Chest Port 1 View  Result Date: 07/28/2020 CLINICAL DATA:  RIGHT pleural effusion post thoracentesis EXAM: PORTABLE CHEST 1 VIEW COMPARISON:  Portable exam 1141 hours compared to 07/27/2020 FINDINGS: Enlargement of cardiac silhouette. Atherosclerotic calcification aorta. Bibasilar pleural effusions and atelectasis decreased on RIGHT since previous exam. No pneumothorax or new infiltrate seen. IMPRESSION: No pneumothorax following thoracentesis. Residual bibasilar pleural effusions and atelectasis. Electronically Signed   By: Lavonia Dana M.D.   On: 07/28/2020 11:50   DG Chest Port 1 View  Result Date: 07/27/2020 CLINICAL DATA:  Questionable sepsis.  Shortness of breath. EXAM: PORTABLE CHEST 1 VIEW COMPARISON:  07/20/2020.  06/19/2019. FINDINGS: Cardiomegaly. Pulmonary venous  congestion cannot be excluded. Bilateral interstitial prominence suggesting interstitial edema and or pneumonitis. Right-sided prominent pleural effusion again noted, decreased in size from prior chest x-[place] of 07/20/2020. Carotid vascular calcification. Prior cervical spine fusion. IMPRESSION: 1. Cardiomegaly. Pulmonary venous congestion. Bilateral interstitial prominence suggesting interstitial edema and or pneumonitis. Right-sided prominent pleural effusion again noted, decreased in size from prior chest x-[place]. 2.  Carotid vascular disease. Electronically Signed   By: [place]   On: 07/27/2020 11:19   DG Swallowing Func-Speech Pathology  Result Date: 07/31/2020 Objective Swallowing Evaluation: Type of Study: MBS-Modified Barium Swallow Study  Patient Details Name: MIQUAN TANDON MRN: [id number] Date of Birth: 31-May-1933 Today's Date: 07/31/2020 Time: SLP Start Time (ACUTE ONLY): 1015 -SLP Stop Time (ACUTE ONLY): 1042 SLP Time Calculation (min) (ACUTE ONLY): 27 min Past Medical History: Past Medical History: Diagnosis Date . Cancer (HCC)   melanoma-left side . Chronic pain   legs and feet . Coronary artery disease  . Dementia ([place])  . Diabetes mellitus without complication ([place])  . Foot drop  . GERD (gastroesophageal reflux disease)  . Gout  . Hyperlipidemia  . Hypertension  . OCD (obsessive compulsive disorder)  . PONV (postoperative nausea and vomiting)  . Psychosis ([place])  . Reflux  . Sarcoidosis  . Sleep apnea   cpap Past Surgical History: Past Surgical History: Procedure Laterality Date . BACK SURGERY    cervical neck . CATARACT EXTRACTION W/PHACO  01/05/2012  Procedure: CATARACT EXTRACTION PHACO AND INTRAOCULAR LENS PLACEMENT (IOC);  Surgeon: [name] Branch, MD;  Location: [facility];  Service: Ophthalmology;  Laterality: Left;  CDE 18.47 . CATARACT EXTRACTION W/PHACO  02/02/2012  Procedure: CATARACT EXTRACTION PHACO AND INTRAOCULAR LENS PLACEMENT (IOC);  Surgeon: [name] Branch, MD;  Location: [facility];  Service:  Ophthalmology;  Laterality: Right;  CDE:15.38 . CHOLECYSTECTOMY   . GALLBLADDER SURGERY   . hemorhoidectomy   . MELANOMA EXCISION    left side-[name] . TONSILLECTOMY   HPI: ERYK BEAVERS is a 86 y.o. male with medical history significant for dementia, cervical myelopathy with inability to ambulate, CAD, OSA, gout, hyperlipidemia, hypertension, and GERD who was brought from [facility] with some hypoxemia as well  as tachycardia and weakness.  His heart rate was approximately 140 bpm and he was noted to be in atrial fibrillation which was new for him.  He was also noted to be tachypneic and has not a known moderate-sized pleural effusion from ultrasound on 9/20, but thoracentesis could not be performed at that time since he could not sit upright.  He was suspected to have pneumonia a few days prior on 9/22 for which he was started on Levaquin intravenously for treatment. Right-sided pleural effusion status post thoracentesis on 9/28. BSE requested 07/29/20.  Subjective: Pt was very alert and responsive today Assessment / Plan / Recommendation CHL IP CLINICAL IMPRESSIONS 07/31/2020 Clinical Impression Pt presents with moderate oropharyngeal dysphagia characterized by consistent silent aspiration (one or two of multiple episodes were sensed with a reflexive cough) of thin liquids (with all presentations - tsp, cup and straw) as a result of poor laryngeal vestibule closure (LVC). Occasional premature spillage of thin liquids is immediately aspirated before the swallow. With all cup and straw sips of thin liquids Pt aspirated trace to moderate amount of thin liquid during the swallow. Further note, decreased LVC is specifically characterized by decreased arytenoid adduction, decreased epiglottic deflection, and decreased pharyngeal constriction. Pt was congnitively unable to consistently complete chin tuck maneuver. NTL were consumed with visualized flash penetration and no aspiration. No penetration or aspiration was  visualized with HTL trials however note increased pharyngeal residuals. Pt consumed puree textures with trace vallecuale residuals that were cleared with cued repeat swallow. It is possible and important to note that thickened liquids and/or puree texture residuals will mix with secretions and eventually be aspirated in small amounts.  Recommend continue with D1/puree diet and NECTAR THICK liquids with meds crushed in puree. ST will continue to follow acutely. Recommend ST f/u at SNF for diet tolerance and dysphagia therapy where Pt is a resident and recommend consider repeat MBSS when clinically appropriate.  SLP Visit Diagnosis Dysphagia, unspecified (R13.10) Attention and concentration deficit following -- Frontal lobe and executive function deficit following -- Impact on safety and function Mild aspiration risk;Moderate aspiration risk   CHL IP TREATMENT RECOMMENDATION 07/31/2020 Treatment Recommendations Therapy as outlined in treatment plan below   Prognosis 07/31/2020 Prognosis for Safe Diet Advancement Fair Barriers to Reach Goals Cognitive deficits Barriers/Prognosis Comment -- CHL IP DIET RECOMMENDATION 07/31/2020 SLP Diet Recommendations Dysphagia 1 (Puree) solids;Nectar thick liquid Liquid Administration via Cup Medication Administration Crushed with puree Compensations Minimize environmental distractions;Slow rate;Follow solids with liquid Postural Changes Remain semi-upright after after feeds/meals (Comment);Seated upright at 90 degrees   CHL IP OTHER RECOMMENDATIONS 07/31/2020 Recommended Consults -- Oral Care Recommendations Oral care BID Other Recommendations Order thickener from pharmacy   CHL IP FOLLOW UP RECOMMENDATIONS 07/31/2020 Follow up Recommendations Skilled Nursing facility;24 hour supervision/assistance   CHL IP FREQUENCY AND DURATION 07/31/2020 Speech Therapy Frequency (ACUTE ONLY) min 1 x/week Treatment Duration 1 week      CHL IP ORAL PHASE 07/31/2020 Oral Phase Impaired Oral - Pudding  Teaspoon -- Oral - Pudding Cup -- Oral - Honey Teaspoon -- Oral - Honey Cup -- Oral - Nectar Teaspoon -- Oral - Nectar Cup -- Oral - Nectar Straw -- Oral - Thin Teaspoon Premature spillage Oral - Thin Cup Premature spillage Oral - Thin Straw Premature spillage Oral - Puree -- Oral - Mech Soft -- Oral - Regular -- Oral - Multi-Consistency -- Oral - Pill -- Oral Phase - Comment --  CHL IP PHARYNGEAL PHASE 07/31/2020 Pharyngeal Phase Impaired Pharyngeal-  Pudding Teaspoon -- Pharyngeal -- Pharyngeal- Pudding Cup -- Pharyngeal -- Pharyngeal- Honey Teaspoon -- Pharyngeal -- Pharyngeal- Honey Cup Pharyngeal residue - valleculae;Pharyngeal residue - pyriform;Reduced epiglottic inversion;Reduced pharyngeal peristalsis;Reduced laryngeal elevation;Reduced tongue base retraction;Reduced airway/laryngeal closure Pharyngeal -- Pharyngeal- Nectar Teaspoon -- Pharyngeal -- Pharyngeal- Nectar Cup Reduced pharyngeal peristalsis;Reduced epiglottic inversion;Reduced airway/laryngeal closure;Reduced tongue base retraction;Penetration/Aspiration during swallow;Pharyngeal residue - valleculae;Pharyngeal residue - pyriform Pharyngeal Material enters airway, remains ABOVE vocal cords then ejected out Pharyngeal- Nectar Straw -- Pharyngeal -- Pharyngeal- Thin Teaspoon Reduced pharyngeal peristalsis;Reduced epiglottic inversion;Reduced airway/laryngeal closure;Reduced tongue base retraction;Penetration/Aspiration during swallow;Penetration/Aspiration before swallow;Moderate aspiration;Trace aspiration;Pharyngeal residue - valleculae;Pharyngeal residue - pyriform Pharyngeal Material enters airway, passes BELOW cords without attempt by patient to eject out (silent aspiration);Material enters airway, passes BELOW cords and not ejected out despite cough attempt by patient Pharyngeal- Thin Cup Reduced pharyngeal peristalsis;Reduced epiglottic inversion;Reduced airway/laryngeal closure;Reduced tongue base retraction;Penetration/Aspiration during  swallow;Penetration/Aspiration before swallow;Moderate aspiration;Trace aspiration;Pharyngeal residue - valleculae;Pharyngeal residue - pyriform Pharyngeal Material enters airway, passes BELOW cords without attempt by patient to eject out (silent aspiration);Material enters airway, passes BELOW cords and not ejected out despite cough attempt by patient Pharyngeal- Thin Straw Reduced pharyngeal peristalsis;Reduced epiglottic inversion;Reduced airway/laryngeal closure;Reduced tongue base retraction;Penetration/Aspiration during swallow;Penetration/Aspiration before swallow;Moderate aspiration;Trace aspiration;Pharyngeal residue - valleculae;Pharyngeal residue - pyriform Pharyngeal Material enters airway, passes BELOW cords without attempt by patient to eject out (silent aspiration);Material enters airway, passes BELOW cords and not ejected out despite cough attempt by patient Pharyngeal- Puree Pharyngeal residue - valleculae;Pharyngeal residue - pyriform;Reduced epiglottic inversion Pharyngeal -- Pharyngeal- Mechanical Soft -- Pharyngeal -- Pharyngeal- Regular (No Data) Pharyngeal -- Pharyngeal- Multi-consistency -- Pharyngeal -- Pharyngeal- Pill -- Pharyngeal -- Pharyngeal Comment --  CHL IP CERVICAL ESOPHAGEAL PHASE 07/31/2020 Cervical Esophageal Phase WFL Pudding Teaspoon -- Pudding Cup -- Honey Teaspoon -- Honey Cup -- Nectar Teaspoon -- Nectar Cup -- Nectar Straw -- Thin Teaspoon -- Thin Cup -- Thin Straw -- Puree -- Mechanical Soft -- Regular -- Multi-consistency -- Pill -- Cervical Esophageal Comment -- Amelia H. Roddie Mc, CCC-SLP Speech Language Pathologist Wende Bushy 07/31/2020, 11:34 AM              ECHOCARDIOGRAM LIMITED  Result Date: 07/27/2020    ECHOCARDIOGRAM LIMITED REPORT   Patient Name:   BABAK LUCUS Date of Exam: 07/27/2020 Medical Rec #:  [id number]     Height:       69.0 in Accession #:    [id number]    Weight:       210.6 lb Date of Birth:  11/02/1932    BSA:          2.112 m  Patient Age:    86 years      BP:           104/89 mmHg Patient Gender: M             HR:           107 bpm. Exam Location:  [facility] Procedure: Limited Echo, Cardiac Doppler and Limited Color Doppler Indications:    Atrial Fibrillation 427.31 / I48.91  History:        Patient has prior history of Echocardiogram examinations, most                 recent 04/10/2020. Risk Factors:Hypertension, Dyslipidemia and                 Former Smoker. History of Covid 19, Radiculopathy of cervical  spine.  Sonographer:    Alvino Chapel RCS Referring Phys: [id number] Bajandas D Ken Caryl  1. Left ventricular ejection fraction, by estimation, is 60 to 65%. The left ventricle has normal function. There is mild left ventricular hypertrophy.  2. Right ventricular systolic function is mildly reduced. The right ventricular size is moderately enlarged.  3. Left atrial size was moderately dilated.  4. Right atrial size was moderately dilated.  5. The pericardial effusion is circumferential.  6. The aortic valve was not well visualized.  7. Limited echo in the setting of new onset atrial fibrillation FINDINGS  Left Ventricle: Left ventricular ejection fraction, by estimation, is 60 to 65%. The left ventricle has normal function. The left ventricular internal cavity size was normal in size. There is mild left ventricular hypertrophy. Right Ventricle: The right ventricular size is moderately enlarged. Right ventricular systolic function is mildly reduced. Left Atrium: Left atrial size was moderately dilated. Right Atrium: Right atrial size was moderately dilated. Pericardium: Trivial pericardial effusion is present. The pericardial effusion is circumferential. Aortic Valve: The aortic valve was not well visualized. Aorta: The aortic root is normal in size and structure. LEFT VENTRICLE PLAX 2D LVIDd:         4.35 cm LVIDs:         2.87 cm LV PW:         1.08 cm LV IVS:        1.21 cm LVOT diam:     2.10 cm LVOT Area:      3.46 cm  RIGHT VENTRICLE TAPSE (M-mode): 1.4 cm LEFT ATRIUM         Index      RIGHT ATRIUM           Index LA diam:    4.25 cm 2.01 cm/m RA Area:     32.90 cm                                RA Volume:   122.00 ml 57.77 ml/m   AORTA Ao Root diam: 3.40 cm TRICUSPID VALVE TR Peak grad:   32.5 mmHg TR Vmax:        285.00 cm/s  SHUNTS Systemic Diam: 2.10 cm Carlyle Dolly MD Electronically signed by Carlyle Dolly MD Signature Date/Time: 07/27/2020/4:48:15 PM    Final    US THORACENTESIS ASP PLEURAL SPACE W/IMG GUIDE  Result Date: 07/28/2020 INDICATION: RIGHT pleural effusion EXAM: ULTRASOUND GUIDED DIAGNOSTIC AND THERAPEUTIC THORACENTESIS MEDICATIONS: None. COMPLICATIONS: None immediate. PROCEDURE: Patient was unable to provide written informed consent. Patient's wife was not available to provide consent, though she was aware of the need for the procedure. A note of medical necessity for the procedure was placed on the chart by the provider. Time-out protocol was utilized. Ultrasound was performed to localize and mark an adequate pocket of fluid in the RIGHT chest. The area was then prepped and draped in the normal sterile fashion. 1% Lidocaine was used for local anesthesia. Under ultrasound guidance a 8 French thoracentesis catheter was introduced. Thoracentesis was performed. The catheter was removed and a dressing applied. FINDINGS: A total of approximately 1280 mL of amber colored RIGHT pleural fluid was removed. Samples were sent to the laboratory as requested by the clinical team. IMPRESSION: Successful ultrasound guided RIGHT thoracentesis yielding 1280 mL of pleural fluid. Electronically Signed   By: Lavonia Dana M.D.   On: 07/28/2020 11:58      Discharge Exam:  Vitals:   08/01/20 0533 08/01/20 1345  BP: 104/74 106/70  Pulse: 66 96  Resp: 20 18  Temp: 97.6 F (36.4 C) 98.3 F (36.8 C)  SpO2: 98% 93%   Vitals:   07/31/20 2042 08/01/20 0500 08/01/20 0533 08/01/20 1345  BP:   104/74 106/70   Pulse:   66 96  Resp:   20 18  Temp:   97.6 F (36.4 C) 98.3 F (36.8 C)  TempSrc:   Oral   SpO2: 92%  98% 93%  Weight:  94.2 kg    Height:       General exam: Appears calm and comfortable,NAD Respiratory system: Clear to auscultation. Respiratory effort normal.  Currently on room air. Cardiovascular system: normal S1 & S2 heard, irregular heart rate Gastrointestinal system: Abdomen is nondistended, soft and nontender.  Central nervous system: nonfocal exam.  Extremities: leg edema trace, bilateral Skin: No rashes, lesions or ulcers Psychiatry: Flat affect  The results of significant diagnostics from this hospitalization (including imaging, microbiology, ancillary and laboratory) are listed below for reference.     Microbiology: Recent Results (from the past 240 hour(s))  Urine culture     Status: None   Collection Time: 07/27/20 10:57 AM   Specimen: In/Out Cath Urine  Result Value Ref Range Status   Specimen Description   Final    IN/OUT CATH URINE Performed at [facility], [address], [place], [place] [address]    Special Requests   Final    NONE Performed at [facility], [address], [place], [place] [address]    Culture   Final    NO GROWTH Performed at [facility], [place] [address], [place], [place] [address]    Report Status 07/28/2020 FINAL  Final  Respiratory Panel by RT PCR (Flu A&B, Covid) - Nasopharyngeal Swab     Status: None   Collection Time: 07/27/20 11:00 AM   Specimen: Nasopharyngeal Swab  Result Value Ref Range Status   SARS Coronavirus 2 by RT PCR NEGATIVE NEGATIVE Final    Comment: (NOTE) SARS-CoV-2 target nucleic acids are NOT DETECTED.  The SARS-CoV-2 RNA is generally detectable in upper respiratoy specimens during the acute phase of infection. The lowest concentration of SARS-CoV-2 viral copies this assay can detect is 131 copies/mL. A negative result does not preclude SARS-Cov-2 infection and should not be used as the sole  basis for treatment or other patient management decisions. A negative result may occur with  improper specimen collection/handling, submission of specimen other than nasopharyngeal swab, presence of viral mutation(s) within the areas targeted by this assay, and inadequate number of viral copies (<131 copies/mL). A negative result must be combined with clinical observations, patient history, and epidemiological information. The expected result is Negative.  Fact Sheet for Patients:  PinkCheek.be  Fact Sheet for Healthcare Providers:  GravelBags.it  This test is no t yet approved or cleared by the [place] FDA and  has been authorized for detection and/or diagnosis of SARS-CoV-2 by FDA under an Emergency Use Authorization (EUA). This EUA will remain  in effect (meaning this test can be used) for the duration of the COVID-19 declaration under Section 564(b)(1) of the Act, 21 U.S.C. section 360bbb-3(b)(1), unless the authorization is terminated or revoked sooner.     Influenza A by PCR NEGATIVE NEGATIVE Final   Influenza B by PCR NEGATIVE NEGATIVE Final    Comment: (NOTE) The Xpert Xpress SARS-CoV-2/FLU/RSV assay is intended as an aid in  the diagnosis of  influenza from Nasopharyngeal swab specimens and  should not be used as a sole basis for treatment. Nasal washings and  aspirates are unacceptable for Xpert Xpress SARS-CoV-2/FLU/RSV  testing.  Fact Sheet for Patients: PinkCheek.be  Fact Sheet for Healthcare Providers: GravelBags.it  This test is not yet approved or cleared by the [place] FDA and  has been authorized for detection and/or diagnosis of SARS-CoV-2 by  FDA under an Emergency Use Authorization (EUA). This EUA will remain  in effect (meaning this test can be used) for the duration of the  Covid-19 declaration under Section 564(b)(1) of the  Act, 21  U.S.C. section 360bbb-3(b)(1), unless the authorization is  terminated or revoked. Performed at [facility], [address] [place], [place] [address]   Blood Culture (routine x 2)     Status: None   Collection Time: 07/27/20 11:44 AM   Specimen: BLOOD LEFT FOREARM  Result Value Ref Range Status   Specimen Description BLOOD LEFT FOREARM  Final   Special Requests   Final    BOTTLES DRAWN AEROBIC AND ANAEROBIC Blood Culture adequate volume   Culture   Final    NO GROWTH 5 DAYS Performed at [facility], [address] [place], [place] [address]    Report Status 08/01/2020 FINAL  Final  Blood Culture (routine x 2)     Status: None   Collection Time: 07/27/20 11:52 AM   Specimen: BLOOD RIGHT FOREARM  Result Value Ref Range Status   Specimen Description BLOOD RIGHT FOREARM  Final   Special Requests   Final    BOTTLES DRAWN AEROBIC AND ANAEROBIC Blood Culture adequate volume   Culture   Final    NO GROWTH 5 DAYS Performed at [facility], [address] [place], [place] [address]    Report Status 08/01/2020 FINAL  Final  MRSA PCR Screening     Status: Abnormal   Collection Time: 07/27/20 12:54 PM   Specimen: Nasopharyngeal  Result Value Ref Range Status   MRSA by PCR POSITIVE (A) NEGATIVE Final    Comment:        The GeneXpert MRSA Assay (FDA approved for NASAL specimens only), is one component of a comprehensive MRSA colonization surveillance program. It is not intended to diagnose MRSA infection nor to guide or monitor treatment for MRSA infections. RESULT CALLED TO, READ BACK BY AND VERIFIED WITH: HILTON, LAWERENCE @0742  ON 07/28/20 BY JONES,T Performed at [facility], [address], [place], [place] [address]   Gram stain     Status: None   Collection Time: 07/28/20 11:25 AM   Specimen: Pleura  Result Value Ref Range Status   Specimen Description PLEURAL  Final   Special Requests NONE  Final   Gram Stain   Final    CYTOSPIN SMEAR NO ORGANISMS SEEN WBC  PRESENT,BOTH PMN AND MONONUCLEAR Performed at [facility], [address] [place], [place] [address]    Report Status 07/28/2020 FINAL  Final  Culture, body fluid-bottle     Status: None (Preliminary result)   Collection Time: 07/28/20 11:25 AM   Specimen: Pleura  Result Value Ref Range Status   Specimen Description PLEURAL  Final   Special Requests 10CC  Final   Culture   Final    NO GROWTH 4 DAYS Performed at [facility], [address], [place] [address]    Report Status PENDING  Incomplete     Labs: BNP (last 3 results) Recent Labs    07/20/20 1200  BNP  332.9*   Basic Metabolic Panel: Recent Labs  Lab 07/28/20 0452 07/29/20 0401 07/30/20 0758 07/31/20 0333 08/01/20 0725  NA 140 139 139 139 140  K 3.0* 3.5 4.0 4.0 4.7  CL 95* 98 100 99 101  CO2 35* 32 31 31 33*  GLUCOSE 98 101* 100* 89 89  BUN 22 27* 23 25* 24*  CREATININE 0.67 0.81 0.66 0.55* 0.76  CALCIUM 8.9 8.4* 8.5* 8.4* 8.5*  MG 1.3* 1.8 1.8 2.0 1.8   Liver Function Tests: Recent Labs  Lab 07/27/20 1145  AST 31  ALT 28  ALKPHOS 83  BILITOT 1.0  PROT 5.8*  ALBUMIN 3.4*   No results for input(s): LIPASE, AMYLASE in the last 168 hours. No results for input(s): AMMONIA in the last 168 hours. CBC: Recent Labs  Lab 07/27/20 1145 07/27/20 1145 07/28/20 0452 07/29/20 0401 07/30/20 0758 07/31/20 0333 08/01/20 0725  WBC 8.4   < > 6.9 8.1 8.0 8.5 7.4  NEUTROABS 6.6  --   --   --   --   --   --   HGB 13.6   < > 13.0 13.0 13.9 13.5 14.3  HCT 41.4   < > 40.7 42.1 45.9 43.4 45.8  MCV 90.6   < > 91.9 91.9 93.9 91.6 91.6  PLT 133*   < > 120* 145* 142* 158 169   < > = values in this interval not displayed.   Cardiac Enzymes: No results for input(s): CKTOTAL, CKMB, CKMBINDEX, TROPONINI in the last 168 hours. BNP: Invalid input(s): POCBNP CBG: Recent Labs  Lab 07/31/20 1120 07/31/20 1647 07/31/20 2119 08/01/20 0747 08/01/20 1111  GLUCAP 102* 98 131* 95 94   D-Dimer No results for  input(s): DDIMER in the last 72 hours. Hgb A1c No results for input(s): HGBA1C in the last 72 hours. Lipid Profile No results for input(s): CHOL, HDL, LDLCALC, TRIG, CHOLHDL, LDLDIRECT in the last 72 hours. Thyroid function studies No results for input(s): TSH, T4TOTAL, T3FREE, THYROIDAB in the last 72 hours.  Invalid input(s): FREET3 Anemia work up No results for input(s): VITAMINB12, FOLATE, FERRITIN, TIBC, IRON, RETICCTPCT in the last 72 hours. Urinalysis    Component Value Date/Time   COLORURINE YELLOW 07/27/2020 1057   APPEARANCEUR CLEAR 07/27/2020 1057   LABSPEC 1.012 07/27/2020 1057   PHURINE 7.0 07/27/2020 1057   GLUCOSEU NEGATIVE 07/27/2020 1057   HGBUR SMALL (A) 07/27/2020 1057   BILIRUBINUR NEGATIVE 07/27/2020 1057   KETONESUR NEGATIVE 07/27/2020 1057   PROTEINUR NEGATIVE 07/27/2020 1057   NITRITE NEGATIVE 07/27/2020 1057   LEUKOCYTESUR NEGATIVE 07/27/2020 1057   Sepsis Labs Invalid input(s): PROCALCITONIN,  WBC,  LACTICIDVEN Microbiology Recent Results (from the past 240 hour(s))  Urine culture     Status: None   Collection Time: 07/27/20 10:57 AM   Specimen: In/Out Cath Urine  Result Value Ref Range Status   Specimen Description   Final    IN/OUT CATH URINE Performed at [facility], [address], [place], [place] [address]    Special Requests   Final    NONE Performed at [facility], [address], [place], [place] [address]    Culture   Final    NO GROWTH Performed at [facility], [place] [address], [place], [place] [address]    Report Status 07/28/2020 FINAL  Final  Respiratory Panel by RT PCR (Flu A&B, Covid) - Nasopharyngeal Swab     Status: None   Collection Time: 07/27/20 11:00 AM   Specimen:  Nasopharyngeal Swab  Result Value Ref Range Status   SARS Coronavirus 2 by RT PCR NEGATIVE NEGATIVE Final    Comment: (NOTE) SARS-CoV-2 target nucleic acids are NOT DETECTED.  The SARS-CoV-2 RNA is generally detectable in upper  respiratoy specimens during the acute phase of infection. The lowest concentration of SARS-CoV-2 viral copies this assay can detect is 131 copies/mL. A negative result does not preclude SARS-Cov-2 infection and should not be used as the sole basis for treatment or other patient management decisions. A negative result may occur with  improper specimen collection/handling, submission of specimen other than nasopharyngeal swab, presence of viral mutation(s) within the areas targeted by this assay, and inadequate number of viral copies (<131 copies/mL). A negative result must be combined with clinical observations, patient history, and epidemiological information. The expected result is Negative.  Fact Sheet for Patients:  PinkCheek.be  Fact Sheet for Healthcare Providers:  GravelBags.it  This test is no t yet approved or cleared by the [place] FDA and  has been authorized for detection and/or diagnosis of SARS-CoV-2 by FDA under an Emergency Use Authorization (EUA). This EUA will remain  in effect (meaning this test can be used) for the duration of the COVID-19 declaration under Section 564(b)(1) of the Act, 21 U.S.C. section 360bbb-3(b)(1), unless the authorization is terminated or revoked sooner.     Influenza A by PCR NEGATIVE NEGATIVE Final   Influenza B by PCR NEGATIVE NEGATIVE Final    Comment: (NOTE) The Xpert Xpress SARS-CoV-2/FLU/RSV assay is intended as an aid in  the diagnosis of influenza from Nasopharyngeal swab specimens and  should not be used as a sole basis for treatment. Nasal washings and  aspirates are unacceptable for Xpert Xpress SARS-CoV-2/FLU/RSV  testing.  Fact Sheet for Patients: PinkCheek.be  Fact Sheet for Healthcare Providers: GravelBags.it  This test is not yet approved or cleared by the [place] FDA and  has been  authorized for detection and/or diagnosis of SARS-CoV-2 by  FDA under an Emergency Use Authorization (EUA). This EUA will remain  in effect (meaning this test can be used) for the duration of the  Covid-19 declaration under Section 564(b)(1) of the Act, 21  U.S.C. section 360bbb-3(b)(1), unless the authorization is  terminated or revoked. Performed at [facility], [address], [place], [place] [address]   Blood Culture (routine x 2)     Status: None   Collection Time: 07/27/20 11:44 AM   Specimen: BLOOD LEFT FOREARM  Result Value Ref Range Status   Specimen Description BLOOD LEFT FOREARM  Final   Special Requests   Final    BOTTLES DRAWN AEROBIC AND ANAEROBIC Blood Culture adequate volume   Culture   Final    NO GROWTH 5 DAYS Performed at [facility], [address] [place], [place] [address]    Report Status 08/01/2020 FINAL  Final  Blood Culture (routine x 2)     Status: None   Collection Time: 07/27/20 11:52 AM   Specimen: BLOOD RIGHT FOREARM  Result Value Ref Range Status   Specimen Description BLOOD RIGHT FOREARM  Final   Special Requests   Final    BOTTLES DRAWN AEROBIC AND ANAEROBIC Blood Culture adequate volume   Culture   Final    NO GROWTH 5 DAYS Performed at [facility], [address] [place],  [address]    Report Status 08/01/2020 FINAL  Final  MRSA PCR Screening     Status: Abnormal   Collection Time: 07/27/20 12:54 PM  Specimen: Nasopharyngeal  Result Value Ref Range Status   MRSA by PCR POSITIVE (A) NEGATIVE Final    Comment:        The GeneXpert MRSA Assay (FDA approved for NASAL specimens only), is one component of a comprehensive MRSA colonization surveillance program. It is not intended to diagnose MRSA infection nor to guide or monitor treatment for MRSA infections. RESULT CALLED TO, READ BACK BY AND VERIFIED WITH: HILTON, LAWERENCE @0742  ON 07/28/20 BY JONES,T Performed at [facility], [address] [place], [place] [address]    Gram stain     Status: None   Collection Time: 07/28/20 11:25 AM   Specimen: Pleura  Result Value Ref Range Status   Specimen Description PLEURAL  Final   Special Requests NONE  Final   Gram Stain   Final    CYTOSPIN SMEAR NO ORGANISMS SEEN WBC PRESENT,BOTH PMN AND MONONUCLEAR Performed at [facility], [address], [place], [place] [address]    Report Status 07/28/2020 FINAL  Final  Culture, body fluid-bottle     Status: None (Preliminary result)   Collection Time: 07/28/20 11:25 AM   Specimen: Pleura  Result Value Ref Range Status   Specimen Description PLEURAL  Final   Special Requests 10CC  Final   Culture   Final    NO GROWTH 4 DAYS Performed at [facility], [address] [place], [place] [address]    Report Status PENDING  Incomplete   Time coordinating discharge: 40 minutes   SIGNED:  Irwin Brakeman, MD  [facility] 08/01/2020, 3:23 PM How to contact the [facility] Attending or Consulting provider [place] or covering provider during after hours [place], for this patient?  1. Check the care team in [facility] and look for a) attending/consulting [facility] provider listed and b) the [facility] team listed 2. Log into [email or web address] and use [place]'s universal password to access. If you do not have the password, please contact the hospital operator. 3. Locate the [facility] provider you are looking for under [facility] and page to a number that you can be directly reached. 4. If you still have difficulty reaching the provider, please page the [facility] (Director on Call) for the Hospitalists listed on amion for assistance.

## 2020-08-01 NOTE — Progress Notes (Signed)
Report called to Amanda at the [facility]. 

## 2020-08-02 LAB — CULTURE, BODY FLUID W GRAM STAIN -BOTTLE: Culture: NO GROWTH

## 2020-08-03 ENCOUNTER — Non-Acute Institutional Stay (SKILLED_NURSING_FACILITY): Payer: Medicare Other | Admitting: Adult Health

## 2020-08-03 ENCOUNTER — Encounter: Payer: Self-pay | Admitting: Adult Health

## 2020-08-03 DIAGNOSIS — E785 Hyperlipidemia, unspecified: Secondary | ICD-10-CM

## 2020-08-03 DIAGNOSIS — I952 Hypotension due to drugs: Secondary | ICD-10-CM

## 2020-08-03 DIAGNOSIS — R0902 Hypoxemia: Secondary | ICD-10-CM

## 2020-08-03 DIAGNOSIS — I10 Essential (primary) hypertension: Secondary | ICD-10-CM

## 2020-08-03 DIAGNOSIS — Z9989 Dependence on other enabling machines and devices: Secondary | ICD-10-CM

## 2020-08-03 DIAGNOSIS — J90 Pleural effusion, not elsewhere classified: Secondary | ICD-10-CM | POA: Diagnosis not present

## 2020-08-03 DIAGNOSIS — K219 Gastro-esophageal reflux disease without esophagitis: Secondary | ICD-10-CM

## 2020-08-03 DIAGNOSIS — J449 Chronic obstructive pulmonary disease, unspecified: Secondary | ICD-10-CM

## 2020-08-03 DIAGNOSIS — R600 Localized edema: Secondary | ICD-10-CM

## 2020-08-03 DIAGNOSIS — J3089 Other allergic rhinitis: Secondary | ICD-10-CM

## 2020-08-03 DIAGNOSIS — I4891 Unspecified atrial fibrillation: Secondary | ICD-10-CM | POA: Diagnosis not present

## 2020-08-03 DIAGNOSIS — Z862 Personal history of diseases of the blood and blood-forming organs and certain disorders involving the immune mechanism: Secondary | ICD-10-CM

## 2020-08-03 DIAGNOSIS — M5412 Radiculopathy, cervical region: Secondary | ICD-10-CM

## 2020-08-03 DIAGNOSIS — G4733 Obstructive sleep apnea (adult) (pediatric): Secondary | ICD-10-CM

## 2020-08-03 DIAGNOSIS — Z85820 Personal history of malignant melanoma of skin: Secondary | ICD-10-CM

## 2020-08-03 DIAGNOSIS — F0150 Vascular dementia without behavioral disturbance: Secondary | ICD-10-CM

## 2020-08-03 DIAGNOSIS — M1A09X0 Idiopathic chronic gout, multiple sites, without tophus (tophi): Secondary | ICD-10-CM

## 2020-08-03 DIAGNOSIS — I25118 Atherosclerotic heart disease of native coronary artery with other forms of angina pectoris: Secondary | ICD-10-CM | POA: Diagnosis not present

## 2020-08-03 NOTE — Progress Notes (Signed)
Location:    [place] Room Number: 127/D Place of Service:  SNF (31)   CODE STATUS: DNR  Allergies  Allergen Reactions  . Other   . Penicillin G   . Sulfonic Acid (3,5-Dibromo-4-H-Ox-Benz)   . Penicillins Rash  . Sulfonamide Derivatives Rash  . Tetanus Toxoid Rash    Chief Complaint  Patient presents with  . Hospitalization Follow-up    Hospitalization Follow Up    HPI:  He is a 86 year old long term resident of this facility who has been hospitalized from 07-27-20 through 08-01-20. He was being treated for HCAP new onset afib; and had a right pleural effusion. The hospital had been unable to tap the right lung on an outpatient basis. His status declined and he was taken to the ED for further evaluation and treatment. He was admitted for new onset afib with RVR he was initially placed on a cardizem drip and transitioned to toprol xl. He had to be placed on midodrine for hypotension. He had a right thoracentesis performed and removed 1280cc. He tells me that he is breathing better; has less edema present; no cough; no chest pain. He is able to lay flat in bed. He will continue to be followed for his chronic illnesses including: afib gerd; dementia.   Past Medical History:  Diagnosis Date  . Cancer (HCC)    melanoma-left side  . Chronic pain    legs and feet  . Coronary artery disease   . Dementia ([place])   . Diabetes mellitus without complication ([place])   . Foot drop   . GERD (gastroesophageal reflux disease)   . Gout   . Hyperlipidemia   . Hypertension   . OCD (obsessive compulsive disorder)   . PONV (postoperative nausea and vomiting)   . Psychosis ([place])   . Reflux   . Sarcoidosis   . Sleep apnea    cpap    Past Surgical History:  Procedure Laterality Date  . BACK SURGERY     cervical neck  . CATARACT EXTRACTION W/PHACO  01/05/2012   Procedure: CATARACT EXTRACTION PHACO AND INTRAOCULAR LENS PLACEMENT (IOC);  Surgeon: [name] Branch, MD;   Location: [facility];  Service: Ophthalmology;  Laterality: Left;  CDE 18.47  . CATARACT EXTRACTION W/PHACO  02/02/2012   Procedure: CATARACT EXTRACTION PHACO AND INTRAOCULAR LENS PLACEMENT (IOC);  Surgeon: [name] Branch, MD;  Location: [facility];  Service: Ophthalmology;  Laterality: Right;  CDE:15.38  . CHOLECYSTECTOMY    . GALLBLADDER SURGERY    . hemorhoidectomy    . MELANOMA EXCISION     left side-[name]  . TONSILLECTOMY      Social History   Socioeconomic History  . Marital status: Married    Spouse name: Not on file  . Number of children: Not on file  . Years of education: Not on file  . Highest education level: Not on file  Occupational History  . Occupation: retired   Tobacco Use  . Smoking status: Former Smoker    Packs/day: 0.25    Years: 20.00    Pack years: 5.00    Types: Pipe    Quit date: 01/02/1974    Years since quitting: 46.6  . Smokeless tobacco: Never Used  Vaping Use  . Vaping Use: Never used  Substance and Sexual Activity  . Alcohol use: No  . Drug use: No  . Sexual activity: Not Currently  Other Topics Concern  . Not on file  Social History Narrative  Former pipe smoker, quit many years ago.   Long term resident of [facility]      Social Determinants of Health   Financial Resource Strain: Low Risk   . Difficulty of Paying Living Expenses: Not hard at all  Food Insecurity: No Food Insecurity  . Worried About Charity fundraiser in the Last Year: Never true  . Ran Out of Food in the Last Year: Never true  Transportation Needs: No Transportation Needs  . Lack of Transportation (Medical): No  . Lack of Transportation (Non-Medical): No  Physical Activity: Inactive  . Days of Exercise per Week: 0 days  . Minutes of Exercise per Session: 0 min  Stress: No Stress Concern Present  . Feeling of Stress : Not at all  Social Connections: Socially Isolated  . Frequency of Communication with Friends and Family: Never  . Frequency of Social Gatherings with Friends and  Family: Never  . Attends Religious Services: Never  . Active Member of Clubs or Organizations: No  . Attends Archivist Meetings: Not asked  . Marital Status: Married  Human resources officer Violence: Not At Risk  . Fear of Current or Ex-Partner: No  . Emotionally Abused: No  . Physically Abused: No  . Sexually Abused: No   Family History  Problem Relation Age of Onset  . Heart disease Other   . Arthritis Other   . Cancer Other   . Anesthesia problems Neg Hx   . Hypotension Neg Hx   . Malignant hyperthermia Neg Hx   . Pseudochol deficiency Neg Hx       VITAL SIGNS BP 106/76   Pulse 81   Temp 98.2 F (36.8 C)   Resp 20   Ht 5\' 9"  (1.753 m)   Wt 202 lb 9.6 oz (91.9 kg)   SpO2 97%   BMI 29.92 kg/m   Outpatient Encounter Medications as of 08/03/2020  Medication Sig  . acetaminophen (TYLENOL) 500 MG tablet Take 500 mg by mouth every 6 (six) hours as needed. For pain   . allopurinol (ZYLOPRIM) 300 MG tablet Take 300 mg by mouth daily.    [name] apixaban (ELIQUIS) 5 MG TABS tablet Take 5 mg by mouth 2 (two) times daily.  [name] (LIPITOR) 20 MG tablet Take 20 mg by mouth every evening.  Roseanne Kaufman [place]-Cast[place] Oil [facility]) OINT Special Instructions: Apply to sacrum/coccyx and bilateral buttocks qshift prn erythema.  . cetirizine (ZYRTEC) 10 MG tablet Take 10 mg by mouth at bedtime.  . donepezil (ARICEPT) 10 MG tablet Take 10 mg by mouth at bedtime.   . gabapentin (NEURONTIN) 100 MG capsule Take 100 mg by mouth at bedtime.  [name] ipratropium-albuterol (DUONEB) 0.5-2.5 (3) MG/3ML SOLN Take 3 mLs by nebulization every 6 (six) hours as needed.  . metoprolol succinate (TOPROL-XL) 25 MG 24 hr tablet Take 3 tablets (75 mg total) by mouth daily.  . midodrine (PROAMATINE) 5 MG tablet Take 1 tablet (5 mg total) by mouth 3 (three) times daily with meals.  . NON FORMULARY C-PAP from home, use home settings while sleeping At Bedtime 08:00 PM  . NON FORMULARY D1 puree and Nectar Thick  diet continue NAS as ordered by physician.  . OXYGEN Inhale 2 L into the lungs continuous. for shortness of breath to keep 02 sat above 90%  . pantoprazole (PROTONIX) 40 MG tablet Take 1 tablet (40 mg total) by mouth daily.  . potassium chloride SA (KLOR-CON) 20 MEQ tablet Take 20 mEq by mouth  daily.  . torsemide (DEMADEX) 20 MG tablet Take 1 tablet (20 mg total) by mouth daily.  . Vitamins A & D (VITAMIN A & D) ointment 3 (three) times daily as needed for dry skin. Apply A&D ointment to left heel qshift & prn for erythema.  . [DISCONTINUED] meloxicam (MOBIC) 7.5 MG tablet Take 1 tablet (7.5 mg total) by mouth daily as needed for pain.   No facility-administered encounter medications on file as of 08/03/2020.     SIGNIFICANT DIAGNOSTIC EXAMS   PREVIOUS;   06-19-19: ct of lumbar spine:  1. No acute/traumatic lumbar spine pathology. 2. Osteopenia with extensive multilevel degenerative changes of the spine. 3. Lumbar levoscoliosis and multilevel disc bulge and neural foraminal narrowing. MRI may provide better evaluation. Aortic Atherosclerosis   06-22-19: lumbar spine x-[place]: 1. Degenerative changes. 2. No evidence for acute abnormality   12-17-19: CT of head and cervical spine:  1. No acute intracranial pathology. Small-vessel white matter disease. 2. No fracture or static subluxation of the cervical spine. 3. Anterior cervical discectomy and fusion of C4 through C6, with incorporation of the disc spaces and bony ankylosis of the included levels inferior to this through T3. 4. Prominent osteophytes, disc calcifications and calcifications of the ligamentum flavum, which appear to significantly narrow the cervical canal at C3-C4, minimum AP diameter approximately 4 mm.  02-13-20: MRI lumbar spine:  1. L2 fracture involving the anterior wall and superior endplate, likely subacute. The appearance suggest a hyperextension mechanism. No height loss. 2. L3-L4 severe spinal canal stenosis with  severe right and moderate left neural foraminal stenosis. 3. T12-L1 moderate spinal canal stenosis and severe bilateral neural foraminal stenosis. 4. L1-L2 and L2-L3 severe right neural foraminal stenosis. 5. L5-S1 moderate bilateral neural foraminal stenosis.   02-13-20: MRI cervical spine:  1. 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 2. C4-6 ACDF without spinal canal stenosis. 3. Mild bilateral C6-7 neural foraminal stenosis.  03-20-20: Myoview:  No diagnostic ST segment changes to indicate ischemia. Small, moderate intensity, apical septal and apical to basal inferolateral defects. The inferolateral defect is fixed and consistent with possible scar and the apical septal defect is partially reversible consistent with a mild ischemic territory.This is a high risk study based on calculated LVEF. Would suggest confirmatory echocardiogram. Nuclear stress EF: 15%  04-10-20 2-d echo:  Left ventricular ejection fraction, by estimation, is 55 to 60%. The  left ventricle has normal function. The left ventricle has no regional  wall motion abnormalities. Left ventricular diastolic parameters are  consistent with age-related delayed  relaxation (normal).   07-20-20: chest x-[place]:   Opacification of most of the right hemithorax due to a combination of pleural effusion and consolidation. There is also consolidation in the medial left base. Heart borderline enlarged. There are foci of left carotid artery calcification.  TODAY  07-27-20 2-d echo:  Left ventricular ejection fraction, by estimation, is 60 to 65%. The  left ventricle has normal function. There is mild left ventricular hypertrophy   07-29-20 chest x-Jackie:  1. Cardiomegaly. Pulmonary venous congestion. Bilateral interstitial prominence suggesting interstitial edema and or pneumonitis. Right-sided prominent pleural effusion again  noted, decreased in size from prior chest x-[place]. 2.  Carotid vascular disease  07-28-20: right thoracentesis: Successful ultrasound guided RIGHT thoracentesis yielding 1280 mL of pleural fluid.  07-28-20: chest x-[place]:  No pneumothorax following thoracentesis. Residual bibasilar pleural effusions and atelectasis.  07-31-20: swallow  study: Recommend continue with D1/puree diet and NECTAR THICK liquids with meds crushed in puree.    LABS REVIEWED PREVIOUS;   08-23-19: hgb a1c 5.6  10-05-19: wbc 4.8; hgb 11.8; hct 36.2; mcv 89.8 plt 256; glucose 100; bun 18; creat 0.53 ;k+ 3.9; an++ 128; ca 8.6; d-dimer: 3.36 ferritin 965 10-16-19: glucose 96; bun 14; creat 0.47; k+ 3.7; na++ 134; ca 8.5 12-02-19: wbc 5.9; hgb 12.4; hct 38.9; mcv 91.1 plt 227; chol 96; ldl 43; trig 45; hdl 44  01-30-20: chol 96; ldl 43; trig 45; hdl 44  06-11-20: wbc 5.5; hgb 12.5; hct 39.0 mcv 92.6 plt 184; glucose 99; bun 26; creat 0.68; k+ 4.6; na++ 131; ca 8.9 liver normal albumin 3.5 hgb a1c 5.5; tsh 2.617 07-20-20: wbc 5.1; hgb 13.0; hct 41.2; mcv 90.7 plt 176; glucose 110; bun 25; creat 0.52; k+ 4.5; na++ 129; ca 8.7  07-21-20: wbc 5.7; hgb 13.5; hct 42.0; mcv 91.1 plt 171 glucose 106; bun 21; creat 0.58; k+ 4.1; na++ 132; ca 9.0  TODAY  07-23-20; glucose 110; bun 20; creat 0.62; k+ 3.4; na++ 133; ca 8.7 liver normal albumin 3.2  07-27-20: wbc 8.4; hgb 13.6; hct 41.4; mcv 90.6 plt 133; glucose 104; bun 29; creat 0.88; k+ 3.5; na++ 138; ca 8.4 liver normal albumin 3.4 tsh 2.324 blood and urine culture: no growth 07-29-20: wbc 8.1; hgb 13.0; hct 42.1; mcv 91.9 plt 145; glucose 101; bun 27; creat 0.81; k+ 3.5; na++ 139; ca 8.4 mag 1.8 08-01-20: wbc 7.4; hgb 14.3; hct 45.8; mcv 91.6 plt 169; glucose 89; bun 24; creat 0.74; k+ 4.7; na++ 140; ca 8.5 mag 1.8   Review of Systems  Constitutional: Negative for malaise/fatigue.  Respiratory: Negative for cough and shortness of breath.   Cardiovascular: Negative for chest pain, palpitations  and leg swelling.  Gastrointestinal: Negative for abdominal pain, constipation and heartburn.  Musculoskeletal: Negative for back pain, joint pain and myalgias.  Skin: Negative.   Neurological: Negative for dizziness.  Psychiatric/Behavioral: The patient is not nervous/anxious.      Physical Exam Constitutional:      General: He is not in acute distress.    Appearance: He is well-developed. He is not diaphoretic.  Eyes:     Comments: Bilateral cataract with lens implants   Neck:     Thyroid: No thyromegaly.  Cardiovascular:     Rate and Rhythm: Normal rate and regular rhythm.     Pulses: Normal pulses.     Heart sounds: Normal heart sounds.  Pulmonary:     Effort: Pulmonary effort is normal. No respiratory distress.     Breath sounds: Normal breath sounds.  Abdominal:     General: Bowel sounds are normal. There is no distension.     Palpations: Abdomen is soft.     Tenderness: There is no abdominal tenderness.  Musculoskeletal:     Cervical back: Neck supple.     Right lower leg: Edema present.     Left lower leg: Edema present.     Comments: Ab;le to move all extremities Has trace bilateral lower extremity edema  Lymphadenopathy:     Cervical: No cervical adenopathy.  Skin:    General: Skin is warm and dry.  Neurological:     Mental Status: He is alert. Mental status is at baseline.  Psychiatric:        Mood and Affect: Mood normal.        ASSESSMENT/ PLAN:  TODAY  1. New onset afib: heart  rate is less than 100. Will continue toprol xl 75 mg daily for rate control and eliquis 5 mg twice daily   2. Hypotension due to drugs: is stable b/p 106/76 will continue midodrine 5 mg three times daily   3. Right pleural effusion status post thoracentesis 07-28-20 will monitor   4. Sleep apnea obstructive: is stable will decline CPAP at times  5. GERD without esophagitis: is stable will continue protonix 40 mg daily   6. Vascular dementia without behavioral  disturbance: is without change: weight is 202 pounds; will continue aricept 10 mg daily   7. Chronic non-seasonal allergic rhinitis: is stable will continue zyrtec 10 mg daily  8. Radiculopathy of cervical spine: is stable will continue gabapentin 100 mg daily mobic has been stopped.   9. Essential hypertension: is stable b/p 106/67 will continue toprol xl 75 mg daily   10. Dyslipidemia: is stable LDL 43 will continue lipitor 20 mg daily  11. Idiopathic chronic gout of multiple sites without tophi: is stable will continue allopurinol 300 mg daily   12. COPD with hypoxia/history of sarcoidosis: is stable will continue duoneb every 6 hours as needed  13. History of melanoma is stable history of excision of left flank  14. Coronary artery disease of native artery of native heart with stable angina: is stable will continue toprol xl 75 mg daily   15. Bilateral lower extremity edema: is stable will continue demadex 20 mg daily with k+ 20 meq daily will monitor         MD is aware of resident's narcotic use and is in agreement with current plan of care. We will attempt to wean resident as appropriate.  Ok Edwards NP [facility]  Contact [phone] Monday through Friday 8am- 5pm  After hours call [phone]

## 2020-08-04 DIAGNOSIS — R600 Localized edema: Secondary | ICD-10-CM | POA: Insufficient documentation

## 2020-08-04 DIAGNOSIS — R0902 Hypoxemia: Secondary | ICD-10-CM | POA: Insufficient documentation

## 2020-08-04 DIAGNOSIS — I959 Hypotension, unspecified: Secondary | ICD-10-CM | POA: Insufficient documentation

## 2020-08-05 ENCOUNTER — Encounter: Payer: Self-pay | Admitting: Internal Medicine

## 2020-08-05 ENCOUNTER — Non-Acute Institutional Stay (SKILLED_NURSING_FACILITY): Payer: Medicare Other | Admitting: Internal Medicine

## 2020-08-05 DIAGNOSIS — R1312 Dysphagia, oropharyngeal phase: Secondary | ICD-10-CM

## 2020-08-05 DIAGNOSIS — I4891 Unspecified atrial fibrillation: Secondary | ICD-10-CM

## 2020-08-05 DIAGNOSIS — R1310 Dysphagia, unspecified: Secondary | ICD-10-CM | POA: Insufficient documentation

## 2020-08-05 DIAGNOSIS — J90 Pleural effusion, not elsewhere classified: Secondary | ICD-10-CM

## 2020-08-05 NOTE — Assessment & Plan Note (Addendum)
Postthoracentesis film 07/28/2020 does not suggest consolidation.  It does reveal persistent bilateral effusions and  cardiac enlargement. Transudative effusion in the context of total protein of 5.8 and albumin of 3.4.  Consider protein supplementation.

## 2020-08-05 NOTE — Progress Notes (Signed)
NURSING HOME LOCATION:  [place] ROOM NUMBER: 127/D    CODE STATUS: DNR   STM:[id number], [name], NP   This is a readmission note to [facility] performed on this date less than 30 days from date of readmission.  HPI: Patient was rehospitalized 9/27-10/12/2019 for hypoxemia, tachycardia,& weakness in the context of a large pleural effusion.  At the time of admission pulse rate was approximately 140 which proved to be A. fib with rapid ventricular response.  Attempts have been made to perform thoracentesis but his inability to be positioned safely precluded this. The AF with RVR was treated with a Cardizem drip which was subsequently weaned to oral metoprolol. Right-sided thoracentesis produced 1280 cc of fluid.  Lasix had to be held post thoracentesis due to softer blood pressure. Course was also complicated by some hypoglycemia in the context of anorexia.  Past medical and surgical history: Includes sleep apnea, history of sarcoidosis, GERD, OCD, essential hypertension, dyslipidemia, history of gout, history of melanoma, and CAD. The melanoma was resected from the left abdomen/flank. He has also had a cholecystectomy & CAD stenting..  Social history: Nondrinker; former smoker.  Family history: Reviewed.   Review of systems: He validates that he is still short of breath but this is much improved.  He denies any other active symptoms.  All answers were a monosyllabic "no" without any elaboration.  Constitutional: No fever, significant weight change, fatigue  Eyes: No redness, discharge, pain, vision change ENT/mouth: No nasal congestion, purulent discharge, earache, change in hearing, sore throat  Cardiovascular: No chest pain, palpitations, paroxysmal nocturnal dyspnea  Respiratory: No cough, sputum production, hemoptysis,  significant snoring, apnea  Gastrointestinal: No heartburn, dysphagia, abdominal pain, nausea /vomiting, rectal bleeding, melena, change in  bowels Genitourinary: No dysuria, hematuria, pyuria Musculoskeletal: No joint stiffness, joint swelling, weakness, pain Dermatologic: No rash, pruritus, change in appearance of skin Neurologic: No dizziness, headache, syncope, seizures, numbness, tingling Psychiatric: No significant anxiety, depression, insomnia, anorexia Endocrine: No change in hair/skin/nails, excessive thirst, excessive hunger, excessive urination  Hematologic/lymphatic: No significant bruising, lymphadenopathy, abnormal bleeding Allergy/immunology: No itchy/watery eyes, significant sneezing, urticaria, angioedema  Physical exam:  Pertinent or positive findings: He has thick,disheveled hair and full mustache and beard, all  stark white.  Bilateral ptosis is present along with arcus senilis.  He is edentulous.  Breath sounds are decreased with minimal rales.  Heart sounds are markedly distant, heard best in the epigastrium.  Rhythm appears irregular.  Abdomen is protuberant.  He has 1+ pitting edema.  Pedal pulses are decreased.  He is weak to opposition in all extremities. He has isolated darkened and deformed toenails.  General appearance: Adequately nourished; no acute distress, increased work of breathing is present.   Lymphatic: No lymphadenopathy about the head, neck, axilla. Eyes: No conjunctival inflammation or lid edema is present. There is no scleral icterus. Ears:  External ear exam shows no significant lesions or deformities.   Nose:  External nasal examination shows no deformity or inflammation. Nasal mucosa are pink and moist without lesions, exudates Neck:  No thyromegaly, masses, tenderness noted.    Heart:  No gallop, murmur, click, rub.  Lungs: without wheezes, rhonchi, rubs. Abdomen: Bowel sounds are normal.  Abdomen is soft and nontender with no organomegaly, hernias, masses. GU: Deferred  Extremities:  No cyanosis, clubbing. Neurologic exam: Balance, Rhomberg, finger to nose testing could not be  completed due to clinical state Skin: Warm & dry w/o tenting. No significant lesions or rash.  See clinical summary under each active problem in the Problem List with associated updated therapeutic plan

## 2020-08-05 NOTE — Patient Instructions (Signed)
See assessment and plan under each diagnosis in the problem list and acutely for this visit 

## 2020-08-05 NOTE — Assessment & Plan Note (Addendum)
The large effusion would be expected to be exudative if it were related to aspiration pneumonitis.  Speech Therapy at SNF to follow the patient in reference to the dysphagia

## 2020-08-06 NOTE — Assessment & Plan Note (Signed)
Slow rate atrial fibrillation, continue with apixaban as tolerated.  

## 2020-08-10 ENCOUNTER — Inpatient Hospital Stay (INDEPENDENT_AMBULATORY_CARE_PROVIDER_SITE_OTHER): Payer: Medicare Other

## 2020-08-10 ENCOUNTER — Ambulatory Visit: Payer: Medicare Other | Admitting: Pulmonary Disease

## 2020-08-10 ENCOUNTER — Encounter: Payer: Self-pay | Admitting: Pulmonary Disease

## 2020-08-10 VITALS — BP 120/78 | HR 61 | Temp 97.4°F | Ht 69.0 in | Wt 204.0 lb

## 2020-08-10 DIAGNOSIS — J90 Pleural effusion, not elsewhere classified: Secondary | ICD-10-CM

## 2020-08-10 NOTE — Patient Instructions (Signed)
Patient seen for shortness of breath  Recent thoracentesis for about 1200 cc of fluid Chest x-[place] did improve post thoracentesis  Recent swallow evaluation revealing aspiration   Aspiration precautions  Optimize treatment for decompensated heart failure and fluid overload  Continue treatment for obstructive sleep apnea  Regarding anticoagulation -Unless there is ongoing risk -May consider stopping following further discussion/evaluation -Checking legs for blood clots  Follow-up in 6 weeks  Call with significant concerns

## 2020-08-10 NOTE — Progress Notes (Signed)
Eric Bowman    [id number]    Jun 05, 1933  Primary Care Physician:Green, [name], NP  Referring Physician: Gerlene Fee, NP [place],  [address] [address]  Chief complaint:   Patient being seen for recent chest x-Atwell findings showing large pleural effusion  HPI:  Patient with pleural effusion that required thoracentesis had over 1200 cc of amber-colored fluid drained -Fluid shows mesothelial cells, no with atypia  At the time of the pleural effusion findings, he also did have scrotal edema, lower extremity edema, BNP elevated  Recent swallowing evaluation did reveal silent aspiration  Also prior to being evaluated with the x-Willem showing pleural effusion he was being treated with antibiotics for possible pneumonia  Multifactorial reasons for the pleural effusion  Patient had a chest x-Amram today showing stable findings compared with 20 just had his thoracentesis performed about 2 weeks ago  Patient was recently started on apixaban empirically  Had Covid earlier in the year   Outpatient Encounter Medications as of 08/10/2020  Medication Sig  . acetaminophen (TYLENOL) 500 MG tablet Take 500 mg by mouth every 6 (six) hours as needed. For pain   . allopurinol (ZYLOPRIM) 300 MG tablet Take 300 mg by mouth daily.    [name] apixaban (ELIQUIS) 5 MG TABS tablet Take 5 mg by mouth 2 (two) times daily.  [name] (LIPITOR) 20 MG tablet Take 20 mg by mouth every evening.  Roseanne Kaufman [facility] [facility]) OINT Special Instructions: Apply to sacrum/coccyx and bilateral buttocks qshift prn erythema.  . cetirizine (ZYRTEC) 10 MG tablet Take 10 mg by mouth at bedtime.  . donepezil (ARICEPT) 10 MG tablet Take 10 mg by mouth at bedtime.   . gabapentin (NEURONTIN) 100 MG capsule Take 100 mg by mouth at bedtime.  [name] ipratropium-albuterol (DUONEB) 0.5-2.5 (3) MG/3ML SOLN Take 3 mLs by nebulization every 6 (six) hours as needed.  . meloxicam (MOBIC) 7.5 MG tablet  Take 7.5 mg by mouth daily as needed for pain.  . metoprolol succinate (TOPROL-XL) 25 MG 24 hr tablet Take 3 tablets (75 mg total) by mouth daily.  . midodrine (PROAMATINE) 5 MG tablet Take 1 tablet (5 mg total) by mouth 3 (three) times daily with meals.  . NON FORMULARY C-PAP from home, use home settings while sleeping At Bedtime 08:00 PM  . NON FORMULARY D1 puree and Nectar Thick diet continue NAS as ordered by physician.  . OXYGEN Inhale 2 L into the lungs continuous. for shortness of breath to keep 02 sat above 90%  . pantoprazole (PROTONIX) 40 MG tablet Take 1 tablet (40 mg total) by mouth daily.  . potassium chloride SA (KLOR-CON) 20 MEQ tablet Take 20 mEq by mouth daily.  [name] torsemide (DEMADEX) 20 MG tablet Take 1 tablet (20 mg total) by mouth daily.  . Vitamins A & D (VITAMIN A & D) ointment 3 (three) times daily as needed for dry skin. Apply A&D ointment to left heel qshift & prn for erythema.   No facility-administered encounter medications on file as of 08/10/2020.    Allergies as of 08/10/2020 - Review Complete 08/10/2020  Allergen Reaction Noted  . Other  08/23/2019  . Penicillin g  08/23/2019  . Sulfonic acid (3,5-dibromo-4-h-ox-benz)  08/23/2019  . Penicillins Rash   . Sulfonamide derivatives Rash   . Tetanus toxoid Rash     Past Medical History:  Diagnosis Date  . Cancer (HCC)    melanoma-left side  .  Chronic pain    legs and feet  . Coronary artery disease   . Dementia ([place])   . Diabetes mellitus without complication ([place])   . Foot drop   . GERD (gastroesophageal reflux disease)   . Gout   . Hyperlipidemia   . Hypertension   . OCD (obsessive compulsive disorder)   . PONV (postoperative nausea and vomiting)   . Psychosis ([place])   . Sarcoidosis   . Sleep apnea    cpap    Past Surgical History:  Procedure Laterality Date  . BACK SURGERY     cervical neck  . CATARACT EXTRACTION W/PHACO  01/05/2012   Procedure: CATARACT EXTRACTION PHACO AND INTRAOCULAR LENS  PLACEMENT (IOC);  Surgeon: [name] Branch, MD;  Location: [facility];  Service: Ophthalmology;  Laterality: Left;  CDE 18.47  . CATARACT EXTRACTION W/PHACO  02/02/2012   Procedure: CATARACT EXTRACTION PHACO AND INTRAOCULAR LENS PLACEMENT (IOC);  Surgeon: [name] Branch, MD;  Location: [facility];  Service: Ophthalmology;  Laterality: Right;  CDE:15.38  . CHOLECYSTECTOMY    . hemorhoidectomy    . MELANOMA EXCISION     left side-[name]  . TONSILLECTOMY      Family History  Problem Relation Age of Onset  . Heart disease Other   . Arthritis Other   . Cancer Other   . Anesthesia problems Neg Hx   . Hypotension Neg Hx   . Malignant hyperthermia Neg Hx   . Pseudochol deficiency Neg Hx     Social History   Socioeconomic History  . Marital status: Married    Spouse name: Not on file  . Number of children: Not on file  . Years of education: Not on file  . Highest education level: Not on file  Occupational History  . Occupation: retired   Tobacco Use  . Smoking status: Former Smoker    Packs/day: 0.25    Years: 20.00    Pack years: 5.00    Types: Pipe    Quit date: 01/02/1974    Years since quitting: 46.6  . Smokeless tobacco: Never Used  Vaping Use  . Vaping Use: Never used  Substance and Sexual Activity  . Alcohol use: No  . Drug use: No  . Sexual activity: Not Currently  Other Topics Concern  . Not on file  Social History Narrative   Former pipe smoker, quit many years ago.   Long term resident of [facility]      Social Determinants of Health   Financial Resource Strain: Low Risk   . Difficulty of Paying Living Expenses: Not hard at all  Food Insecurity: No Food Insecurity  . Worried About Charity fundraiser in the Last Year: Never true  . Ran Out of Food in the Last Year: Never true  Transportation Needs: No Transportation Needs  . Lack of Transportation (Medical): No  . Lack of Transportation (Non-Medical): No  Physical Activity: Inactive  . Days of Exercise per Week: 0 days  .  Minutes of Exercise per Session: 0 min  Stress: No Stress Concern Present  . Feeling of Stress : Not at all  Social Connections: Socially Isolated  . Frequency of Communication with Friends and Family: Never  . Frequency of Social Gatherings with Friends and Family: Never  . Attends Religious Services: Never  . Active Member of Clubs or Organizations: No  . Attends Archivist Meetings: Not asked  . Marital Status: Married  Human resources officer Violence: Not At Risk  . Fear of  Current or Ex-Partner: No  . Emotionally Abused: No  . Physically Abused: No  . Sexually Abused: No    Review of Systems  Constitutional: Positive for fatigue.  Respiratory: Positive for apnea and shortness of breath.   Psychiatric/Behavioral: Positive for sleep disturbance.    Vitals:   08/10/20 1342  BP: 120/78  Pulse: 61  Temp: (!) 97.4 F (36.3 C)  SpO2: 98%     Physical Exam Constitutional:      Appearance: He is obese.  HENT:     Head: Normocephalic and atraumatic.     Nose: No congestion or rhinorrhea.     Mouth/Throat:     Mouth: Mucous membranes are moist.     Pharynx: No oropharyngeal exudate.  Eyes:     General:        Right eye: No discharge.        Left eye: No discharge.  Cardiovascular:     Rate and Rhythm: Regular rhythm.     Heart sounds: No murmur heard.  Friction rub present.  Pulmonary:     Effort: No respiratory distress.  Musculoskeletal:     Cervical back: No rigidity or tenderness.  Neurological:     Mental Status: He is alert.    Data Reviewed: Most recent chest x-Baldemar reviewed-chest x-Shunsuke reviewed following thoracentesis showing adequate drainage Chest x-Raidyn was repeated today 08/10/2020 showing stable findings  Pleural fluid analysis was reviewed  Echocardiogram 9/27 revealed ejection fraction of 60 to 65%, moderately dilated right atrial size, moderately dilated left atrium, right ventricular size is moderately enlarged  Assessment:    Decompensated heart failure  Pleural effusion likely related to heart failure, may also be secondary to recent infection  Atelectasis/infection likely related to recurrent aspiration  Aspiration pneumonia  Severe deconditioning    Plan/Recommendations: Optimize diuresis  Continue treatment for obstructive sleep apnea  Regarding anticoagulation, unless there is ongoing risk may consider stopping anticoagulation depending on further evaluation which may include checking the lower extremity for clots, consideration for CT angio.  May need repeated thoracentesis  Aspiration precautions is essential to guard against recurrent lower extremity infections  Follow-up in about 6 weeks  Call with any significant concerns   Sherrilyn Rist MD [facility] 08/10/2020, 2:22 PM  CC: Gerlene Fee, NP

## 2020-08-12 ENCOUNTER — Non-Acute Institutional Stay (SKILLED_NURSING_FACILITY): Payer: Medicare Other | Admitting: Adult Health

## 2020-08-12 ENCOUNTER — Encounter: Payer: Self-pay | Admitting: Adult Health

## 2020-08-12 DIAGNOSIS — I5032 Chronic diastolic (congestive) heart failure: Secondary | ICD-10-CM | POA: Diagnosis not present

## 2020-08-12 DIAGNOSIS — J90 Pleural effusion, not elsewhere classified: Secondary | ICD-10-CM | POA: Diagnosis not present

## 2020-08-12 DIAGNOSIS — I4891 Unspecified atrial fibrillation: Secondary | ICD-10-CM | POA: Diagnosis not present

## 2020-08-12 DIAGNOSIS — I952 Hypotension due to drugs: Secondary | ICD-10-CM | POA: Diagnosis not present

## 2020-08-12 NOTE — Progress Notes (Signed)
Location:    [facility] Room Number: 127/D Place of Service:  SNF (31)   CODE STATUS: DNR  Allergies  Allergen Reactions  . Other   . Penicillin G   . Sulfonic Acid (3,5-Dibromo-4-H-Ox-Benz)   . Penicillins Rash  . Sulfonamide Derivatives Rash  . Tetanus Toxoid Rash    Chief Complaint  Patient presents with  . Short Term Rehab ([facility])          Chronic diastolic congestive heart failure:    Atrial fibrillation with rapid ventricular response:     Hypotension due to drugs:  Right pleural effusion    Weekly follow up for the first 30 days post hospitalization.     HPI:  He is a 86 year old long term resident of this facility being seen for the management of his chronic illnesses: Chronic diastolic congestive heart failure:    Atrial fibrillation with rapid ventricular response:     Hypotension due to drugs:  Right pleural effusion. He is complaining of a non-productive cough and shortness of breath. He is able to lay flat in bed. No reports of changes in appetite.   Past Medical History:  Diagnosis Date  . Cancer (HCC)    melanoma-left side  . Chronic pain    legs and feet  . Coronary artery disease   . Dementia ([place])   . Diabetes mellitus without complication ([place])   . Foot drop   . GERD (gastroesophageal reflux disease)   . Gout   . Hyperlipidemia   . Hypertension   . OCD (obsessive compulsive disorder)   . PONV (postoperative nausea and vomiting)   . Psychosis ([place])   . Sarcoidosis   . Sleep apnea    cpap    Past Surgical History:  Procedure Laterality Date  . BACK SURGERY     cervical neck  . CATARACT EXTRACTION W/PHACO  01/05/2012   Procedure: CATARACT EXTRACTION PHACO AND INTRAOCULAR LENS PLACEMENT (IOC);  Surgeon: [name] Branch, MD;  Location: [facility];  Service: Ophthalmology;  Laterality: Left;  CDE 18.47  . CATARACT EXTRACTION W/PHACO  02/02/2012   Procedure: CATARACT EXTRACTION PHACO AND INTRAOCULAR LENS PLACEMENT (IOC);  Surgeon: [name] Branch, MD;  Location: [facility];  Service: Ophthalmology;  Laterality: Right;  CDE:15.38  . CHOLECYSTECTOMY    . hemorhoidectomy    . MELANOMA EXCISION     left side-[name]  . TONSILLECTOMY      Social History   Socioeconomic History  . Marital status: Married    Spouse name: Not on file  . Number of children: Not on file  . Years of education: Not on file  . Highest education level: Not on file  Occupational History  . Occupation: retired   Tobacco Use  . Smoking status: Former Smoker    Packs/day: 0.25    Years: 20.00    Pack years: 5.00    Types: Pipe    Quit date: 01/02/1974    Years since quitting: 46.6  . Smokeless tobacco: Never Used  Vaping Use  . Vaping Use: Never used  Substance and Sexual Activity  . Alcohol use: No  . Drug use: No  . Sexual activity: Not Currently  Other Topics Concern  . Not on file  Social History Narrative   Former pipe smoker, quit many years ago.   Long term resident of [facility]      Social Determinants of Health   Financial Resource Strain: Low Risk   . Difficulty of  Paying Living Expenses: Not hard at all  Food Insecurity: No Food Insecurity  . Worried About Charity fundraiser in the Last Year: Never true  . Ran Out of Food in the Last Year: Never true  Transportation Needs: No Transportation Needs  . Lack of Transportation (Medical): No  . Lack of Transportation (Non-Medical): No  Physical Activity: Inactive  . Days of Exercise per Week: 0 days  . Minutes of Exercise per Session: 0 min  Stress: No Stress Concern Present  . Feeling of Stress : Not at all  Social Connections: Socially Isolated  . Frequency of Communication with Friends and Family: Never  . Frequency of Social Gatherings with Friends and Family: Never  . Attends Religious Services: Never  . Active Member of Clubs or Organizations: No  . Attends Archivist Meetings: Not asked  . Marital Status: Married  Human resources officer Violence: Not At Risk  . Fear of  Current or Ex-Partner: No  . Emotionally Abused: No  . Physically Abused: No  . Sexually Abused: No   Family History  Problem Relation Age of Onset  . Heart disease Other   . Arthritis Other   . Cancer Other   . Anesthesia problems Neg Hx   . Hypotension Neg Hx   . Malignant hyperthermia Neg Hx   . Pseudochol deficiency Neg Hx       VITAL SIGNS BP 118/83   Pulse (!) 118   Temp 97.8 F (36.6 C)   Resp 20   Ht 5\' 9"  (1.753 m)   Wt 202 lb 9.6 oz (91.9 kg)   SpO2 97%   BMI 29.92 kg/m   Outpatient Encounter Medications as of 08/12/2020  Medication Sig  . acetaminophen (TYLENOL) 500 MG tablet Take 500 mg by mouth every 6 (six) hours as needed. For pain   . allopurinol (ZYLOPRIM) 300 MG tablet Take 300 mg by mouth daily.    [name] apixaban (ELIQUIS) 5 MG TABS tablet Take 5 mg by mouth 2 (two) times daily.  [name] (LIPITOR) 20 MG tablet Take 20 mg by mouth every evening.  Roseanne Kaufman [employer] [facility]) OINT Special Instructions: Apply to sacrum/coccyx and bilateral buttocks qshift prn erythema.  . cetirizine (ZYRTEC) 10 MG tablet Take 10 mg by mouth at bedtime.  . donepezil (ARICEPT) 10 MG tablet Take 10 mg by mouth at bedtime.   . gabapentin (NEURONTIN) 100 MG capsule Take 100 mg by mouth at bedtime.  [name] ipratropium-albuterol (DUONEB) 0.5-2.5 (3) MG/3ML SOLN Take 3 mLs by nebulization every 6 (six) hours as needed.  . meloxicam (MOBIC) 7.5 MG tablet Take 7.5 mg by mouth daily as needed for pain.  . metoprolol succinate (TOPROL-XL) 25 MG 24 hr tablet Take 3 tablets (75 mg total) by mouth daily.  . midodrine (PROAMATINE) 5 MG tablet Take 1 tablet (5 mg total) by mouth 3 (three) times daily with meals.  . NON FORMULARY C-PAP from home, use home settings while sleeping At Bedtime 08:00 PM  . NON FORMULARY D1 puree and Nectar Thick diet continue NAS as ordered by physician.  . OXYGEN Inhale 2 L into the lungs continuous. for shortness of breath to keep 02 sat above 90%  .  pantoprazole (PROTONIX) 40 MG tablet Take 1 tablet (40 mg total) by mouth daily.  . potassium chloride SA (KLOR-CON) 20 MEQ tablet Take 20 mEq by mouth daily.  [name] Specialty Vitamins Products (PROSTATE PO) Take 30 mLs by mouth 2 (two) times daily.  [name]  torsemide (DEMADEX) 20 MG tablet Take 1 tablet (20 mg total) by mouth daily.  . Vitamins A & D (VITAMIN A & D) ointment 3 (three) times daily as needed for dry skin. Apply A&D ointment to left heel qshift & prn for erythema.   No facility-administered encounter medications on file as of 08/12/2020.     SIGNIFICANT DIAGNOSTIC EXAMS   PREVIOUS;   06-19-19: ct of lumbar spine:  1. No acute/traumatic lumbar spine pathology. 2. Osteopenia with extensive multilevel degenerative changes of the spine. 3. Lumbar levoscoliosis and multilevel disc bulge and neural foraminal narrowing. MRI may provide better evaluation. Aortic Atherosclerosis   06-22-19: lumbar spine x-Levaughn: 1. Degenerative changes. 2. No evidence for acute abnormality   12-17-19: CT of head and cervical spine:  1. No acute intracranial pathology. Small-vessel white matter disease. 2. No fracture or static subluxation of the cervical spine. 3. Anterior cervical discectomy and fusion of C4 through C6, with incorporation of the disc spaces and bony ankylosis of the included levels inferior to this through T3. 4. Prominent osteophytes, disc calcifications and calcifications of the ligamentum flavum, which appear to significantly narrow the cervical canal at C3-C4, minimum AP diameter approximately 4 mm.  02-13-20: MRI lumbar spine:  1. L2 fracture involving the anterior wall and superior endplate, likely subacute. The appearance suggest a hyperextension mechanism. No height loss. 2. L3-L4 severe spinal canal stenosis with severe right and moderate left neural foraminal stenosis. 3. T12-L1 moderate spinal canal stenosis and severe bilateral neural foraminal stenosis. 4. L1-L2 and L2-L3  severe right neural foraminal stenosis. 5. L5-S1 moderate bilateral neural foraminal stenosis.   02-13-20: MRI cervical spine:  1. 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 2. C4-6 ACDF without spinal canal stenosis. 3. Mild bilateral C6-7 neural foraminal stenosis.  03-20-20: Myoview:  No diagnostic ST segment changes to indicate ischemia. Small, moderate intensity, apical septal and apical to basal inferolateral defects. The inferolateral defect is fixed and consistent with possible scar and the apical septal defect is partially reversible consistent with a mild ischemic territory.This is a high risk study based on calculated LVEF. Would suggest confirmatory echocardiogram. Nuclear stress EF: 15%  04-10-20 2-d echo:  Left ventricular ejection fraction, by estimation, is 55 to 60%. The  left ventricle has normal function. The left ventricle has no regional  wall motion abnormalities. Left ventricular diastolic parameters are  consistent with age-related delayed  relaxation (normal).   07-20-20: chest x-Jatavis:   Opacification of most of the right hemithorax due to a combination of pleural effusion and consolidation. There is also consolidation in the medial left base. Heart borderline enlarged. There are foci of left carotid artery calcification.  07-27-20 2-d echo:  Left ventricular ejection fraction, by estimation, is 60 to 65%. The  left ventricle has normal function. There is mild left ventricular hypertrophy   07-29-20 chest x-Adryen:  1. Cardiomegaly. Pulmonary venous congestion. Bilateral interstitial prominence suggesting interstitial edema and or pneumonitis. Right-sided prominent pleural effusion again noted, decreased in size from prior chest x-Aaditya. 2.  Carotid vascular disease  07-28-20: right thoracentesis: Successful ultrasound guided RIGHT thoracentesis yielding 1280 mL  of pleural fluid.  07-28-20: chest x-Kamdin:  No pneumothorax following thoracentesis. Residual bibasilar pleural effusions and atelectasis.  07-31-20: swallow study: Recommend continue with D1/puree diet and NECTAR THICK liquids with meds crushed in puree.   TODAY  08-10-20: chest x-Curry: chf with mild to  moderate right effusion seen. Mild interval progression noted.    LABS REVIEWED PREVIOUS;   08-23-19: hgb a1c 5.6  10-05-19: wbc 4.8; hgb 11.8; hct 36.2; mcv 89.8 plt 256; glucose 100; bun 18; creat 0.53 ;k+ 3.9; an++ 128; ca 8.6; d-dimer: 3.36 ferritin 965 10-16-19: glucose 96; bun 14; creat 0.47; k+ 3.7; na++ 134; ca 8.5 12-02-19: wbc 5.9; hgb 12.4; hct 38.9; mcv 91.1 plt 227; chol 96; ldl 43; trig 45; hdl 44  01-30-20: chol 96; ldl 43; trig 45; hdl 44  06-11-20: wbc 5.5; hgb 12.5; hct 39.0 mcv 92.6 plt 184; glucose 99; bun 26; creat 0.68; k+ 4.6; na++ 131; ca 8.9 liver normal albumin 3.5 hgb a1c 5.5; tsh 2.617 07-20-20: wbc 5.1; hgb 13.0; hct 41.2; mcv 90.7 plt 176; glucose 110; bun 25; creat 0.52; k+ 4.5; na++ 129; ca 8.7  07-21-20: wbc 5.7; hgb 13.5; hct 42.0; mcv 91.1 plt 171 glucose 106; bun 21; creat 0.58; k+ 4.1; na++ 132; ca 9.0 07-23-20; glucose 110; bun 20; creat 0.62; k+ 3.4; na++ 133; ca 8.7 liver normal albumin 3.2  07-27-20: wbc 8.4; hgb 13.6; hct 41.4; mcv 90.6 plt 133; glucose 104; bun 29; creat 0.88; k+ 3.5; na++ 138; ca 8.4 liver normal albumin 3.4 tsh 2.324 blood and urine culture: no growth 07-29-20: wbc 8.1; hgb 13.0; hct 42.1; mcv 91.9 plt 145; glucose 101; bun 27; creat 0.81; k+ 3.5; na++ 139; ca 8.4 mag 1.8 08-01-20: wbc 7.4; hgb 14.3; hct 45.8; mcv 91.6 plt 169; glucose 89; bun 24; creat 0.74; k+ 4.7; na++ 140; ca 8.5 mag 1.8    NO NEW LABS.   Review of Systems  Constitutional: Negative for malaise/fatigue.  Respiratory: Positive for cough and shortness of breath.   Cardiovascular: Negative for chest pain, palpitations and leg swelling.  Gastrointestinal: Negative for  abdominal pain, constipation and heartburn.  Musculoskeletal: Negative for back pain, joint pain and myalgias.  Skin: Negative.   Neurological: Negative for dizziness.  Psychiatric/Behavioral: The patient is not nervous/anxious.      Physical Exam Constitutional:      General: He is not in acute distress.    Appearance: He is well-developed. He is not diaphoretic.  Eyes:     Comments: Bilateral cataract with lens implants    Neck:     Thyroid: No thyromegaly.  Cardiovascular:     Rate and Rhythm: Normal rate and regular rhythm.     Heart sounds: Normal heart sounds.  Pulmonary:     Effort: Pulmonary effort is normal. No respiratory distress.     Breath sounds: Wheezing and rhonchi present.  Abdominal:     General: Bowel sounds are normal. There is no distension.     Palpations: Abdomen is soft.     Tenderness: There is no abdominal tenderness.  Musculoskeletal:     Right lower leg: Edema present.     Left lower leg: Edema present.     Comments: Able to move all extremities Has 1-2+ bilateral lower extremity edema   Lymphadenopathy:     Cervical: No cervical adenopathy.  Skin:    General: Skin is warm and dry.     Comments: Bilateral lower extremities discolored   Neurological:     Mental Status: He is alert. Mental status is at baseline.  Psychiatric:        Mood and Affect: Mood normal.        ASSESSMENT/ PLAN:  TODAY  1. Chronic diastolic congestive heart failure: is worse: EF 60-65% (07-27-20)  will increase to: demadex 20 mg twice daily with k+ 20 meq daily; will check BNP   Will check BMP on 08-20-20.   1. Atrial fibrillation with rapid ventricular response: heart rate is stable will continue toprol xl 75 mg daily for rate control and eliquis 5 mg twice daily   3. Hypotension due to drugs: is stable b/p 118/73 will continue midodrine 5 mg three times daily   4. Right pleural effusion: status post thoracentesis 07-28-20. Is still present on x-Dianne 08-10-20:     PREVIOUS     5. Sleep apnea obstructive: is stable will decline CPAP most times  6. GERD without esophagitis: is stable will continue protonix 40 mg daily   7. Vascular dementia without behavioral disturbance: is without change: weight is 202 pounds; will continue aricept 10 mg daily   8. Chronic non-seasonal allergic rhinitis: is stable will continue zyrtec 10 mg daily  9. Radiculopathy of cervical spine: is stable will continue gabapentin 100 mg daily mobic has been stopped.   10. Essential hypertension: is stable b/p 118/83 will continue toprol xl 75 mg daily   11. Dyslipidemia: is stable LDL 43 will continue lipitor 20 mg daily  12. Idiopathic chronic gout of multiple sites without tophi: is stable will continue allopurinol 300 mg daily   13. COPD with hypoxia/history of sarcoidosis: is stable will continue duoneb every 6 hours as needed  14. History of melanoma is stable history of excision of left flank  15. Coronary artery disease of native artery of native heart with stable angina: is stable will continue toprol xl 75 mg daily                MD is aware of resident's narcotic use and is in agreement with current plan of care. We will attempt to wean resident as appropriate.  Ok Edwards NP [facility]  Contact [phone] Monday through Friday 8am- 5pm  After hours call [phone]

## 2020-08-13 ENCOUNTER — Other Ambulatory Visit (HOSPITAL_COMMUNITY)
Admission: RE | Admit: 2020-08-13 | Discharge: 2020-08-13 | Disposition: A | Discharge status: Home / Self Care | Payer: Medicare Other | Source: Skilled Nursing Facility | Attending: Adult Health | Admitting: Adult Health

## 2020-08-13 DIAGNOSIS — I509 Heart failure, unspecified: Secondary | ICD-10-CM | POA: Insufficient documentation

## 2020-08-13 DIAGNOSIS — Z13228 Encounter for screening for other metabolic disorders: Secondary | ICD-10-CM | POA: Diagnosis present

## 2020-08-13 LAB — BRAIN NATRIURETIC PEPTIDE: B Natriuretic Peptide: 432 pg/mL — ABNORMAL HIGH (ref 0.0–100.0)

## 2020-08-14 DIAGNOSIS — I5032 Chronic diastolic (congestive) heart failure: Secondary | ICD-10-CM | POA: Insufficient documentation

## 2020-08-17 ENCOUNTER — Encounter: Payer: Self-pay | Admitting: Adult Health

## 2020-08-17 ENCOUNTER — Non-Acute Institutional Stay (SKILLED_NURSING_FACILITY): Payer: Medicare Other | Admitting: Adult Health

## 2020-08-17 DIAGNOSIS — J3089 Other allergic rhinitis: Secondary | ICD-10-CM

## 2020-08-17 DIAGNOSIS — K219 Gastro-esophageal reflux disease without esophagitis: Secondary | ICD-10-CM | POA: Diagnosis not present

## 2020-08-17 DIAGNOSIS — F0150 Vascular dementia without behavioral disturbance: Secondary | ICD-10-CM

## 2020-08-17 NOTE — Progress Notes (Signed)
Location:    [facility] Room Number: 127/D Place of Service:  SNF (31)   CODE STATUS: DNR  Allergies  Allergen Reactions  . Other   . Penicillin G   . Sulfonic Acid (3,5-Dibromo-4-H-Ox-Benz)   . Penicillins Rash  . Sulfonamide Derivatives Rash  . Tetanus Toxoid Rash    Chief Complaint  Patient presents with  . Short Term Rehab (STR)         GERD without esophagitis:  Vascular dementia without behavioral disturbance:  Chronic non-seasonal allergic rhinitis:   Weekly follow up for the first 30 days post hospitalization.     HPI:  He is a 86 year old long term resident of this facility being seen for the management of his chronic illnesses:GERD without esophagitis:  Vascular dementia without behavioral disturbance:  Chronic non-seasonal allergic rhinitis. There are no reports of uncontrolled pain. He denies any cough or shortness of breath. He is getting out of bed nearly every day.    Past Medical History:  Diagnosis Date  . Cancer (HCC)    melanoma-left side  . Chronic pain    legs and feet  . Coronary artery disease   . Dementia ([place])   . Diabetes mellitus without complication ([place])   . Foot drop   . GERD (gastroesophageal reflux disease)   . Gout   . Hyperlipidemia   . Hypertension   . OCD (obsessive compulsive disorder)   . PONV (postoperative nausea and vomiting)   . Psychosis ([place])   . Sarcoidosis   . Sleep apnea    cpap    Past Surgical History:  Procedure Laterality Date  . BACK SURGERY     cervical neck  . CATARACT EXTRACTION W/PHACO  01/05/2012   Procedure: CATARACT EXTRACTION PHACO AND INTRAOCULAR LENS PLACEMENT (IOC);  Surgeon: [name] Branch, MD;  Location: [facility];  Service: Ophthalmology;  Laterality: Left;  CDE 18.47  . CATARACT EXTRACTION W/PHACO  02/02/2012   Procedure: CATARACT EXTRACTION PHACO AND INTRAOCULAR LENS PLACEMENT (IOC);  Surgeon: [name] Branch, MD;  Location: [facility];  Service: Ophthalmology;  Laterality: Right;   CDE:15.38  . CHOLECYSTECTOMY    . hemorhoidectomy    . MELANOMA EXCISION     left side-[name]  . TONSILLECTOMY      Social History   Socioeconomic History  . Marital status: Married    Spouse name: Not on file  . Number of children: Not on file  . Years of education: Not on file  . Highest education level: Not on file  Occupational History  . Occupation: retired   Tobacco Use  . Smoking status: Former Smoker    Packs/day: 0.25    Years: 20.00    Pack years: 5.00    Types: Pipe    Quit date: 01/02/1974    Years since quitting: 46.6  . Smokeless tobacco: Never Used  Vaping Use  . Vaping Use: Never used  Substance and Sexual Activity  . Alcohol use: No  . Drug use: No  . Sexual activity: Not Currently  Other Topics Concern  . Not on file  Social History Narrative   Former pipe smoker, quit many years ago.   Long term resident of [facility]      Social Determinants of Health   Financial Resource Strain: Low Risk   . Difficulty of Paying Living Expenses: Not hard at all  Food Insecurity: No Food Insecurity  . Worried About Charity fundraiser in the Last Year: Never true  .  Ran Out of Food in the Last Year: Never true  Transportation Needs: No Transportation Needs  . Lack of Transportation (Medical): No  . Lack of Transportation (Non-Medical): No  Physical Activity: Inactive  . Days of Exercise per Week: 0 days  . Minutes of Exercise per Session: 0 min  Stress: No Stress Concern Present  . Feeling of Stress : Not at all  Social Connections: Socially Isolated  . Frequency of Communication with Friends and Family: Never  . Frequency of Social Gatherings with Friends and Family: Never  . Attends Religious Services: Never  . Active Member of Clubs or Organizations: No  . Attends Archivist Meetings: Not asked  . Marital Status: Married  Human resources officer Violence: Not At Risk  . Fear of Current or Ex-Partner: No  . Emotionally Abused: No  . Physically Abused:  No  . Sexually Abused: No   Family History  Problem Relation Age of Onset  . Heart disease Other   . Arthritis Other   . Cancer Other   . Anesthesia problems Neg Hx   . Hypotension Neg Hx   . Malignant hyperthermia Neg Hx   . Pseudochol deficiency Neg Hx       VITAL SIGNS BP (!) 147/72   Pulse 100   Temp 98 F (36.7 C)   Resp 20   Ht 5\' 9"  (1.753 m)   Wt 202 lb 9.6 oz (91.9 kg)   SpO2 94%   BMI 29.92 kg/m   Outpatient Encounter Medications as of 08/17/2020  Medication Sig  . acetaminophen (TYLENOL) 500 MG tablet Take 500 mg by mouth every 6 (six) hours as needed. For pain   . allopurinol (ZYLOPRIM) 300 MG tablet Take 300 mg by mouth daily.    [name] apixaban (ELIQUIS) 5 MG TABS tablet Take 5 mg by mouth 2 (two) times daily.  [name] (LIPITOR) 20 MG tablet Take 20 mg by mouth every evening.  Roseanne Kaufman [place] [facility]) OINT Special Instructions: Apply to sacrum/coccyx and bilateral buttocks qshift prn erythema.  . cetirizine (ZYRTEC) 10 MG tablet Take 10 mg by mouth at bedtime.  . donepezil (ARICEPT) 10 MG tablet Take 10 mg by mouth at bedtime.   . gabapentin (NEURONTIN) 100 MG capsule Take 100 mg by mouth at bedtime.  [name] ipratropium-albuterol (DUONEB) 0.5-2.5 (3) MG/3ML SOLN Take 3 mLs by nebulization every 6 (six) hours as needed.  . meloxicam (MOBIC) 7.5 MG tablet Take 7.5 mg by mouth daily as needed for pain.  . metoprolol succinate (TOPROL-XL) 25 MG 24 hr tablet Take 3 tablets (75 mg total) by mouth daily.  . midodrine (PROAMATINE) 5 MG tablet Take 1 tablet (5 mg total) by mouth 3 (three) times daily with meals.  . NON FORMULARY C-PAP from home, use home settings while sleeping At Bedtime 08:00 PM  . NON FORMULARY D1 puree and Nectar Thick diet continue NAS as ordered by physician.  . OXYGEN Inhale 2 L into the lungs continuous. for shortness of breath to keep 02 sat above 90%  . pantoprazole (PROTONIX) 40 MG tablet Take 1 tablet (40 mg total) by mouth daily.   . potassium chloride SA (KLOR-CON) 20 MEQ tablet Take 20 mEq by mouth daily.  [name] Specialty Vitamins Products (PROSTATE PO) Take 30 mLs by mouth 2 (two) times daily.  [name] torsemide (DEMADEX) 20 MG tablet Take 20 mg by mouth 2 (two) times daily. For Edema  . Vitamins A & D (VITAMIN A & D) ointment  3 (three) times daily as needed for dry skin. Apply A&D ointment to left heel qshift & prn for erythema.  . [DISCONTINUED] torsemide (DEMADEX) 20 MG tablet Take 1 tablet (20 mg total) by mouth daily.   No facility-administered encounter medications on file as of 08/17/2020.     SIGNIFICANT DIAGNOSTIC EXAMS   PREVIOUS;   06-19-19: ct of lumbar spine:  1. No acute/traumatic lumbar spine pathology. 2. Osteopenia with extensive multilevel degenerative changes of the spine. 3. Lumbar levoscoliosis and multilevel disc bulge and neural foraminal narrowing. MRI may provide better evaluation. Aortic Atherosclerosis   06-22-19: lumbar spine x-Harvir: 1. Degenerative changes. 2. No evidence for acute abnormality   12-17-19: CT of head and cervical spine:  1. No acute intracranial pathology. Small-vessel white matter disease. 2. No fracture or static subluxation of the cervical spine. 3. Anterior cervical discectomy and fusion of C4 through C6, with incorporation of the disc spaces and bony ankylosis of the included levels inferior to this through T3. 4. Prominent osteophytes, disc calcifications and calcifications of the ligamentum flavum, which appear to significantly narrow the cervical canal at C3-C4, minimum AP diameter approximately 4 mm.  02-13-20: MRI lumbar spine:  1. L2 fracture involving the anterior wall and superior endplate, likely subacute. The appearance suggest a hyperextension mechanism. No height loss. 2. L3-L4 severe spinal canal stenosis with severe right and moderate left neural foraminal stenosis. 3. T12-L1 moderate spinal canal stenosis and severe bilateral neural foraminal  stenosis. 4. L1-L2 and L2-L3 severe right neural foraminal stenosis. 5. L5-S1 moderate bilateral neural foraminal stenosis.   02-13-20: MRI cervical spine:  1. 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 2. C4-6 ACDF without spinal canal stenosis. 3. Mild bilateral C6-7 neural foraminal stenosis.  03-20-20: Myoview:  No diagnostic ST segment changes to indicate ischemia. Small, moderate intensity, apical septal and apical to basal inferolateral defects. The inferolateral defect is fixed and consistent with possible scar and the apical septal defect is partially reversible consistent with a mild ischemic territory.This is a high risk study based on calculated LVEF. Would suggest confirmatory echocardiogram. Nuclear stress EF: 15%  04-10-20 2-d echo:  Left ventricular ejection fraction, by estimation, is 55 to 60%. The  left ventricle has normal function. The left ventricle has no regional  wall motion abnormalities. Left ventricular diastolic parameters are  consistent with age-related delayed  relaxation (normal).   07-20-20: chest x-Ngoc:   Opacification of most of the right hemithorax due to a combination of pleural effusion and consolidation. There is also consolidation in the medial left base. Heart borderline enlarged. There are foci of left carotid artery calcification.  07-27-20 2-d echo:  Left ventricular ejection fraction, by estimation, is 60 to 65%. The  left ventricle has normal function. There is mild left ventricular hypertrophy   07-29-20 chest x-Jhamari:  1. Cardiomegaly. Pulmonary venous congestion. Bilateral interstitial prominence suggesting interstitial edema and or pneumonitis. Right-sided prominent pleural effusion again noted, decreased in size from prior chest x-Wynton. 2.  Carotid vascular disease  07-28-20: right thoracentesis: Successful ultrasound guided RIGHT  thoracentesis yielding 1280 mL of pleural fluid.  07-28-20: chest x-Daxtin:  No pneumothorax following thoracentesis. Residual bibasilar pleural effusions and atelectasis.  07-31-20: swallow study: Recommend continue with D1/puree diet and NECTAR THICK liquids with meds crushed in puree.   08-10-20: chest x-Jonmarc: chf with mild to moderate right effusion seen. Mild interval progression noted.  NO NEW EXAMS.    LABS REVIEWED PREVIOUS;   08-23-19: hgb a1c 5.6  10-05-19: wbc 4.8; hgb 11.8; hct 36.2; mcv 89.8 plt 256; glucose 100; bun 18; creat 0.53 ;k+ 3.9; an++ 128; ca 8.6; d-dimer: 3.36 ferritin 965 10-16-19: glucose 96; bun 14; creat 0.47; k+ 3.7; na++ 134; ca 8.5 12-02-19: wbc 5.9; hgb 12.4; hct 38.9; mcv 91.1 plt 227; chol 96; ldl 43; trig 45; hdl 44  01-30-20: chol 96; ldl 43; trig 45; hdl 44  06-11-20: wbc 5.5; hgb 12.5; hct 39.0 mcv 92.6 plt 184; glucose 99; bun 26; creat 0.68; k+ 4.6; na++ 131; ca 8.9 liver normal albumin 3.5 hgb a1c 5.5; tsh 2.617 07-20-20: wbc 5.1; hgb 13.0; hct 41.2; mcv 90.7 plt 176; glucose 110; bun 25; creat 0.52; k+ 4.5; na++ 129; ca 8.7  07-21-20: wbc 5.7; hgb 13.5; hct 42.0; mcv 91.1 plt 171 glucose 106; bun 21; creat 0.58; k+ 4.1; na++ 132; ca 9.0 07-23-20; glucose 110; bun 20; creat 0.62; k+ 3.4; na++ 133; ca 8.7 liver normal albumin 3.2  07-27-20: wbc 8.4; hgb 13.6; hct 41.4; mcv 90.6 plt 133; glucose 104; bun 29; creat 0.88; k+ 3.5; na++ 138; ca 8.4 liver normal albumin 3.4 tsh 2.324 blood and urine culture: no growth 07-29-20: wbc 8.1; hgb 13.0; hct 42.1; mcv 91.9 plt 145; glucose 101; bun 27; creat 0.81; k+ 3.5; na++ 139; ca 8.4 mag 1.8 08-01-20: wbc 7.4; hgb 14.3; hct 45.8; mcv 91.6 plt 169; glucose 89; bun 24; creat 0.74; k+ 4.7; na++ 140; ca 8.5 mag 1.8   TODAY  08-13-20: BNP 432.0  Review of Systems  Constitutional: Negative for malaise/fatigue.  Respiratory: Negative for cough and shortness of breath.   Cardiovascular: Negative for chest pain, palpitations  and leg swelling.  Gastrointestinal: Negative for abdominal pain, constipation and heartburn.  Musculoskeletal: Negative for back pain, joint pain and myalgias.  Skin: Negative.   Neurological: Negative for dizziness.  Psychiatric/Behavioral: The patient is not nervous/anxious.     Physical Exam Constitutional:      General: He is not in acute distress.    Appearance: He is well-developed. He is not diaphoretic.  Eyes:     Comments: Bilateral cataract with lens implants  Neck:     Thyroid: No thyromegaly.  Cardiovascular:     Rate and Rhythm: Normal rate and regular rhythm.     Pulses: Normal pulses.     Heart sounds: Normal heart sounds.  Pulmonary:     Effort: Pulmonary effort is normal. No respiratory distress.     Breath sounds: Wheezing present.     Comments: Scattered  Abdominal:     General: Bowel sounds are normal. There is no distension.     Palpations: Abdomen is soft.     Tenderness: There is no abdominal tenderness.  Musculoskeletal:     Cervical back: Neck supple.     Right lower leg: Edema present.     Left lower leg: Edema present.     Comments: Able to move all extremities Has 1+ bilateral lower extremity edema    Lymphadenopathy:     Cervical: No cervical adenopathy.  Skin:    General: Skin is warm and dry.     Comments: Bilateral lower extremities discolored  Neurological:     Mental Status: He is alert. Mental status is at baseline.  Psychiatric:        Mood and Affect: Mood normal.     ASSESSMENT/ PLAN:  TODAY  1. GERD without  esophagitis: is stable will continue protonix 40 mg daily   2. Vascular dementia without behavioral disturbance: is without change weight is 202 pounds; will continue aricept 10 mg daily  3. Chronic non-seasonal allergic rhinitis: will continue zyrtec 10 mg daily   PREVIOUS     4. Sleep apnea obstructive: is stable will decline CPAP most times  5. Radiculopathy of cervical spine: is stable will continue gabapentin  100 mg daily mobic has been stopped.   6. Essential hypertension: is stable b/p 147/72 will continue toprol xl 75 mg daily   7. Dyslipidemia: is stable LDL 43 will continue lipitor 20 mg daily  8. Idiopathic chronic gout of multiple sites without tophi: is stable will continue allopurinol 300 mg daily   9. COPD with hypoxia/history of sarcoidosis: is stable will continue duoneb every 6 hours as needed  10. History of melanoma is stable history of excision of left flank  11. Coronary artery disease of native artery of native heart with stable angina: is stable will continue toprol xl 75 mg daily   12. Chronic diastolic congestive heart failure: is worse: EF 60-65% (07-27-20)  will continue: demadex 20 mg twice daily with k+ 20 meq daily;    13. Atrial fibrillation with rapid ventricular response: heart rate is stable will continue toprol xl 75 mg daily for rate control and eliquis 5 mg twice daily   14. Hypotension due to drugs: is stable b/p 147/72 will continue midodrine 5 mg three times daily   15. Right pleural effusion: status post thoracentesis 07-28-20. Is still present on x-Deontra 08-10-20         MD is aware of resident's narcotic use and is in agreement with current plan of care. We will attempt to wean resident as appropriate.  Ok Edwards NP [facility]  Contact [phone] Monday through Friday 8am- 5pm  After hours call [phone]

## 2020-08-20 ENCOUNTER — Inpatient Hospital Stay (HOSPITAL_COMMUNITY): Payer: Medicare Other | Attending: Internal Medicine

## 2020-08-20 ENCOUNTER — Encounter: Payer: Self-pay | Admitting: Adult Health

## 2020-08-20 ENCOUNTER — Non-Acute Institutional Stay (SKILLED_NURSING_FACILITY): Payer: Medicare Other | Admitting: Adult Health

## 2020-08-20 ENCOUNTER — Other Ambulatory Visit (HOSPITAL_COMMUNITY)
Admission: RE | Admit: 2020-08-20 | Discharge: 2020-08-20 | Disposition: A | Discharge status: Home / Self Care | Payer: Medicare Other | Source: Skilled Nursing Facility | Attending: Adult Health | Admitting: Adult Health

## 2020-08-20 DIAGNOSIS — J90 Pleural effusion, not elsewhere classified: Secondary | ICD-10-CM | POA: Diagnosis not present

## 2020-08-20 DIAGNOSIS — I10 Essential (primary) hypertension: Secondary | ICD-10-CM | POA: Insufficient documentation

## 2020-08-20 DIAGNOSIS — I4891 Unspecified atrial fibrillation: Secondary | ICD-10-CM

## 2020-08-20 DIAGNOSIS — E870 Hyperosmolality and hypernatremia: Secondary | ICD-10-CM

## 2020-08-20 DIAGNOSIS — F0150 Vascular dementia without behavioral disturbance: Secondary | ICD-10-CM | POA: Diagnosis not present

## 2020-08-20 DIAGNOSIS — J449 Chronic obstructive pulmonary disease, unspecified: Secondary | ICD-10-CM

## 2020-08-20 DIAGNOSIS — R0902 Hypoxemia: Secondary | ICD-10-CM

## 2020-08-20 DIAGNOSIS — N179 Acute kidney failure, unspecified: Secondary | ICD-10-CM

## 2020-08-20 LAB — BASIC METABOLIC PANEL
Anion gap: 15 (ref 5–15)
BUN: 81 mg/dL — ABNORMAL HIGH (ref 8–23)
CO2: 30 mmol/L (ref 22–32)
Calcium: 9.6 mg/dL (ref 8.9–10.3)
Chloride: 105 mmol/L (ref 98–111)
Creatinine, Ser: 1.46 mg/dL — ABNORMAL HIGH (ref 0.61–1.24)
GFR, Estimated: 47 mL/min — ABNORMAL LOW (ref >60)
Glucose, Bld: 113 mg/dL — ABNORMAL HIGH (ref 70–99)
Potassium: 4.2 mmol/L (ref 3.5–5.1)
Sodium: 150 mmol/L — ABNORMAL HIGH (ref 135–145)

## 2020-08-20 NOTE — Progress Notes (Signed)
Location:    [place] Room Number: 127/D Place of Service:  SNF (31)   CODE STATUS: DNR  Allergies  Allergen Reactions   Other    Penicillin G    Sulfonic Acid (3,5-Dibromo-4-H-Ox-Benz)    Penicillins Rash   Sulfonamide Derivatives Rash   Tetanus Toxoid Rash    Chief Complaint  Patient presents with   Acute Visit    Care Plan Meeting    HPI:  We have come together for his care plan meeting. Family present. BIMS 10/15 mood 0/30. No recent falls. His weight is stable at 203 pounds; is on nectar thick liquids; does feed himself. He is extensive to dependent assist with his adls; is incontinent of bladder and bowel. There are no reports of uncontrolled pain. He continues with a cough and shortness of breath. His sodium is 150; with slightly elevated bun creat. He will need IVF. He continue to be followed for his chronic illnesses including: Vascular dementia without behavioral disturbance Atrial fibrillation with rapid ventricular response COPD with hypoxia  Past Medical History:  Diagnosis Date   Cancer ([place])    melanoma-left side   Chronic pain    legs and feet   Coronary artery disease    Dementia ([facility])    Diabetes mellitus without complication (HCC)    Foot drop    GERD (gastroesophageal reflux disease)    Gout    Hyperlipidemia    Hypertension    OCD (obsessive compulsive disorder)    PONV (postoperative nausea and vomiting)    Psychosis ([place])    Sarcoidosis    Sleep apnea    cpap    Past Surgical History:  Procedure Laterality Date   BACK SURGERY     cervical neck   CATARACT EXTRACTION W/PHACO  01/05/2012   Procedure: CATARACT EXTRACTION PHACO AND INTRAOCULAR LENS PLACEMENT ([place]);  Surgeon: [name] Branch, MD;  Location: [facility];  Service: Ophthalmology;  Laterality: Left;  CDE 18.47   CATARACT EXTRACTION W/PHACO  02/02/2012   Procedure: CATARACT EXTRACTION PHACO AND INTRAOCULAR LENS PLACEMENT ([facility]);  Surgeon: [name] Branch, MD;  Location: [facility];  Service: Ophthalmology;  Laterality: Right;  CDE:15.38   CHOLECYSTECTOMY     hemorhoidectomy     MELANOMA EXCISION     left side-[name]   TONSILLECTOMY      Social History   Socioeconomic History   Marital status: Married    Spouse name: Not on file   Number of children: Not on file   Years of education: Not on file   Highest education level: Not on file  Occupational History   Occupation: retired   Tobacco Use   Smoking status: Former Smoker    Packs/day: 0.25    Years: 20.00    Pack years: 5.00    Types: Pipe    Quit date: 01/02/1974    Years since quitting: 46.6   Smokeless tobacco: Never Used  Vaping Use   Vaping Use: Never used  Substance and Sexual Activity   Alcohol use: No   Drug use: No   Sexual activity: Not Currently  Other Topics Concern   Not on file  Social History Narrative   Former pipe smoker, quit many years ago.   Long term resident of [facility]      Social Determinants of Health   Financial Resource Strain: Low Risk    Difficulty of Paying Living Expenses: Not hard at all  Food Insecurity: No Food Insecurity   Worried About  Running Out of Food in the Last Year: Never true   Ran Out of Food in the Last Year: Never true  Transportation Needs: No Transportation Needs   Lack of Transportation (Medical): No   Lack of Transportation (Non-Medical): No  Physical Activity: Inactive   Days of Exercise per Week: 0 days   Minutes of Exercise per Session: 0 min  Stress: No Stress Concern Present   Feeling of Stress : Not at all  Social Connections: Socially Isolated   Frequency of Communication with Friends and Family: Never   Frequency of Social Gatherings with Friends and Family: Never   Attends Religious Services: Never   Marine scientist or Organizations: No   Attends Music therapist: Not asked   Marital Status: Married  Human resources officer Violence: Not At Risk   Fear of  Current or Ex-Partner: No   Emotionally Abused: No   Physically Abused: No   Sexually Abused: No   Family History  Problem Relation Age of Onset   Heart disease Other    Arthritis Other    Cancer Other    Anesthesia problems Neg Hx    Hypotension Neg Hx    Malignant hyperthermia Neg Hx    Pseudochol deficiency Neg Hx       VITAL SIGNS BP (!) 170/84    Pulse 85    Temp (!) 96.4 F (35.8 C)    Resp 20    Ht 5\' 9"  (1.753 m)    Wt 202 lb 9.6 oz (91.9 kg)    SpO2 95%    BMI 29.92 kg/m   Outpatient Encounter Medications as of 08/20/2020  Medication Sig   acetaminophen (TYLENOL) 500 MG tablet Take 500 mg by mouth every 6 (six) hours as needed. For pain    allopurinol (ZYLOPRIM) 300 MG tablet Take 300 mg by mouth daily.     apixaban (ELIQUIS) 5 MG TABS tablet Take 5 mg by mouth 2 (two) times daily.   atorvastatin (LIPITOR) 20 MG tablet Take 20 mg by mouth every evening.   Balsam [place]-Castor Oil [facility]) OINT Special Instructions: Apply to sacrum/coccyx and bilateral buttocks qshift prn erythema.   cetirizine (ZYRTEC) 10 MG tablet Take 10 mg by mouth at bedtime.   donepezil (ARICEPT) 10 MG tablet Take 10 mg by mouth at bedtime.    gabapentin (NEURONTIN) 100 MG capsule Take 100 mg by mouth at bedtime.   ipratropium-albuterol (DUONEB) 0.5-2.5 (3) MG/3ML SOLN Take 3 mLs by nebulization every 6 (six) hours as needed.   meloxicam (MOBIC) 7.5 MG tablet Take 7.5 mg by mouth daily as needed for pain.   metoprolol succinate (TOPROL-XL) 25 MG 24 hr tablet Take 3 tablets (75 mg total) by mouth daily.   midodrine (PROAMATINE) 5 MG tablet Take 1 tablet (5 mg total) by mouth 3 (three) times daily with meals.   NON FORMULARY C-PAP from home, use home settings while sleeping At Bedtime 08:00 PM   NON FORMULARY D1 puree and Nectar Thick diet continue NAS as ordered by physician.   OXYGEN Inhale 2 L into the lungs continuous. for shortness of breath to keep 02 sat above 90%    pantoprazole (PROTONIX) 40 MG tablet Take 1 tablet (40 mg total) by mouth daily.   potassium chloride SA (KLOR-CON) 20 MEQ tablet Take 20 mEq by mouth daily.   sodium chloride 0.45 % parenteral solution; 0.45 %; amt: 75 cc per hour; intravenous Special Instructions: for acute renal failure. Every Shift Day,  Evening, Night   Specialty Vitamins Products (PROSTATE PO) Take 30 mLs by mouth 2 (two) times daily.   torsemide (DEMADEX) 20 MG tablet Take 20 mg by mouth 2 (two) times daily. For Edema   Vitamins A & D (VITAMIN A & D) ointment 3 (three) times daily as needed for dry skin. Apply A&D ointment to left heel qshift & prn for erythema.   No facility-administered encounter medications on file as of 08/20/2020.     SIGNIFICANT DIAGNOSTIC EXAMS   PREVIOUS;    12-17-19: CT of head and cervical spine:  1. No acute intracranial pathology. Small-vessel white matter disease. 2. No fracture or static subluxation of the cervical spine. 3. Anterior cervical discectomy and fusion of C4 through C6, with incorporation of the disc spaces and bony ankylosis of the included levels inferior to this through T3. 4. Prominent osteophytes, disc calcifications and calcifications of the ligamentum flavum, which appear to significantly narrow the cervical canal at C3-C4, minimum AP diameter approximately 4 mm.  02-13-20: MRI lumbar spine:  1. L2 fracture involving the anterior wall and superior endplate, likely subacute. The appearance suggest a hyperextension mechanism. No height loss. 2. L3-L4 severe spinal canal stenosis with severe right and moderate left neural foraminal stenosis. 3. T12-L1 moderate spinal canal stenosis and severe bilateral neural foraminal stenosis. 4. L1-L2 and L2-L3 severe right neural foraminal stenosis. 5. L5-S1 moderate bilateral neural foraminal stenosis.   02-13-20: MRI cervical spine:  1. 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 2. C4-6 ACDF without spinal canal stenosis. 3. Mild bilateral C6-7 neural foraminal stenosis.  03-20-20: Myoview:  No diagnostic ST segment changes to indicate ischemia. Small, moderate intensity, apical septal and apical to basal inferolateral defects. The inferolateral defect is fixed and consistent with possible scar and the apical septal defect is partially reversible consistent with a mild ischemic territory.This is a high risk study based on calculated LVEF. Would suggest confirmatory echocardiogram. Nuclear stress EF: 15%  04-10-20 2-d echo:  Left ventricular ejection fraction, by estimation, is 55 to 60%. The  left ventricle has normal function. The left ventricle has no regional  wall motion abnormalities. Left ventricular diastolic parameters are  consistent with age-related delayed  relaxation (normal).   07-20-20: chest x-Jex:   Opacification of most of the right hemithorax due to a combination of pleural effusion and consolidation. There is also consolidation in the medial left base. Heart borderline enlarged. There are foci of left carotid artery calcification.  07-27-20 2-d echo:  Left ventricular ejection fraction, by estimation, is 60 to 65%. The  left ventricle has normal function. There is mild left ventricular hypertrophy   07-29-20 chest x-Trevar:  1. Cardiomegaly. Pulmonary venous congestion. Bilateral interstitial prominence suggesting interstitial edema and or pneumonitis. Right-sided prominent pleural effusion again noted, decreased in size from prior chest x-Hamad. 2.  Carotid vascular disease  07-28-20: right thoracentesis: Successful ultrasound guided RIGHT thoracentesis yielding 1280 mL of pleural fluid.  07-28-20: chest x-Avantae:  No pneumothorax following thoracentesis. Residual bibasilar pleural effusions and atelectasis.  07-31-20: swallow study: Recommend continue with D1/puree diet and NECTAR  THICK liquids with meds crushed in puree.   08-10-20: chest x-Jessey: chf with mild to moderate right effusion seen. Mild interval progression noted.   NO NEW EXAMS.    LABS REVIEWED PREVIOUS;   08-23-19: hgb a1c 5.6  10-05-19: wbc 4.8; hgb 11.8; hct 36.2; mcv 89.8 plt  256; glucose 100; bun 18; creat 0.53 ;k+ 3.9; an++ 128; ca 8.6; d-dimer: 3.36 ferritin 965 10-16-19: glucose 96; bun 14; creat 0.47; k+ 3.7; na++ 134; ca 8.5 12-02-19: wbc 5.9; hgb 12.4; hct 38.9; mcv 91.1 plt 227; chol 96; ldl 43; trig 45; hdl 44  01-30-20: chol 96; ldl 43; trig 45; hdl 44  06-11-20: wbc 5.5; hgb 12.5; hct 39.0 mcv 92.6 plt 184; glucose 99; bun 26; creat 0.68; k+ 4.6; na++ 131; ca 8.9 liver normal albumin 3.5 hgb a1c 5.5; tsh 2.617 07-20-20: wbc 5.1; hgb 13.0; hct 41.2; mcv 90.7 plt 176; glucose 110; bun 25; creat 0.52; k+ 4.5; na++ 129; ca 8.7  07-21-20: wbc 5.7; hgb 13.5; hct 42.0; mcv 91.1 plt 171 glucose 106; bun 21; creat 0.58; k+ 4.1; na++ 132; ca 9.0 07-23-20; glucose 110; bun 20; creat 0.62; k+ 3.4; na++ 133; ca 8.7 liver normal albumin 3.2  07-27-20: wbc 8.4; hgb 13.6; hct 41.4; mcv 90.6 plt 133; glucose 104; bun 29; creat 0.88; k+ 3.5; na++ 138; ca 8.4 liver normal albumin 3.4 tsh 2.324 blood and urine culture: no growth 07-29-20: wbc 8.1; hgb 13.0; hct 42.1; mcv 91.9 plt 145; glucose 101; bun 27; creat 0.81; k+ 3.5; na++ 139; ca 8.4 mag 1.8 08-01-20: wbc 7.4; hgb 14.3; hct 45.8; mcv 91.6 plt 169; glucose 89; bun 24; creat 0.74; k+ 4.7; na++ 140; ca 8.5 mag 1.8  08-13-20: BNP 432.0  TODAY  08-20-20: glucose 113; bun 81; creat 1.46; k+ 4.2; na++ 150; ca 9.6  Review of Systems  Constitutional: Negative for malaise/fatigue.  Respiratory: Positive for cough and shortness of breath.   Cardiovascular: Negative for chest pain, palpitations and leg swelling.  Gastrointestinal: Negative for abdominal pain, constipation and heartburn.  Musculoskeletal: Negative for back pain, joint pain and myalgias.  Skin:  Negative.   Neurological: Negative for dizziness.  Psychiatric/Behavioral: The patient is not nervous/anxious.     Physical Exam Constitutional:      General: He is not in acute distress.    Appearance: He is well-developed. He is not diaphoretic.  Eyes:     Comments: Bilateral cataract with lens implants   Neck:     Thyroid: No thyromegaly.  Cardiovascular:     Rate and Rhythm: Normal rate and regular rhythm.     Pulses: Normal pulses.     Heart sounds: Normal heart sounds.  Pulmonary:     Effort: Pulmonary effort is normal. No respiratory distress.     Breath sounds: Wheezing and rhonchi present.  Abdominal:     General: Bowel sounds are normal. There is no distension.     Palpations: Abdomen is soft.     Tenderness: There is no abdominal tenderness.  Musculoskeletal:     Cervical back: Neck supple.     Right lower leg: Edema present.     Left lower leg: Edema present.     Comments: Able to move all extremities Has trace to 1+ bilateral lower extremity edema     Lymphadenopathy:     Cervical: No cervical adenopathy.  Skin:    General: Skin is warm and dry.     Comments: Bilateral lower extremities discolored   Neurological:     Mental Status: He is alert. Mental status is at baseline.  Psychiatric:        Mood and Affect: Mood normal.        ASSESSMENT/ PLAN:  TODAY  1. Vascular dementia without behavioral disturbance 2. Atrial fibrillation with rapid ventricular  response 3. COPD with hypoxia 4. Hypernatremia 5. Acute renal failure unspecified acute renal failure type.   Will get chest x-Donyae Will begin 0.45% NS at 75 cc per hour Will check BMP in the AM Will continue to monitor his status.   MD is aware of resident's narcotic use and is in agreement with current plan of care. We will attempt to wean resident as appropriate.  Ok Edwards NP [facility]  Contact [phone] Monday through Friday 8am- 5pm  After hours call [phone]

## 2020-08-21 ENCOUNTER — Non-Acute Institutional Stay (SKILLED_NURSING_FACILITY): Payer: Medicare Other | Admitting: Adult Health

## 2020-08-21 ENCOUNTER — Other Ambulatory Visit (HOSPITAL_COMMUNITY)
Admission: RE | Admit: 2020-08-21 | Discharge: 2020-08-21 | Disposition: A | Discharge status: Home / Self Care | Payer: Medicare Other | Source: Skilled Nursing Facility | Attending: Adult Health | Admitting: Adult Health

## 2020-08-21 ENCOUNTER — Encounter: Payer: Self-pay | Admitting: Adult Health

## 2020-08-21 DIAGNOSIS — E870 Hyperosmolality and hypernatremia: Secondary | ICD-10-CM

## 2020-08-21 DIAGNOSIS — Z66 Do not resuscitate: Secondary | ICD-10-CM

## 2020-08-21 DIAGNOSIS — N179 Acute kidney failure, unspecified: Secondary | ICD-10-CM | POA: Diagnosis not present

## 2020-08-21 DIAGNOSIS — I10 Essential (primary) hypertension: Secondary | ICD-10-CM | POA: Diagnosis present

## 2020-08-21 LAB — BASIC METABOLIC PANEL
Anion gap: 11 (ref 5–15)
BUN: 66 mg/dL — ABNORMAL HIGH (ref 8–23)
CO2: 31 mmol/L (ref 22–32)
Calcium: 8.8 mg/dL — ABNORMAL LOW (ref 8.9–10.3)
Chloride: 105 mmol/L (ref 98–111)
Creatinine, Ser: 1.19 mg/dL (ref 0.61–1.24)
GFR, Estimated: >60 mL/min (ref >60)
Glucose, Bld: 103 mg/dL — ABNORMAL HIGH (ref 70–99)
Potassium: 3.8 mmol/L (ref 3.5–5.1)
Sodium: 147 mmol/L — ABNORMAL HIGH (ref 135–145)

## 2020-08-21 NOTE — Progress Notes (Addendum)
Location:    [facility] Room Number: 127-D Place of Service:  SNF (31)   CODE STATUS: DNR  Allergies  Allergen Reactions  . Other   . Penicillin G   . Sulfonic Acid (3,5-Dibromo-4-H-Ox-Benz)   . Penicillins Rash  . Sulfonamide Derivatives Rash  . Tetanus Toxoid Rash    Chief Complaint  Patient presents with  . Acute Visit    Review labs     HPI:  His renal labs have improved with na++ 147. His appetite is good; he needs to continue good fluid intake. He denies any pain; no shortness of breath no cough.   Past Medical History:  Diagnosis Date  . Cancer (HCC)    melanoma-left side  . Chronic pain    legs and feet  . Coronary artery disease   . Dementia ([place])   . Diabetes mellitus without complication ([place])   . Foot drop   . GERD (gastroesophageal reflux disease)   . Gout   . Hyperlipidemia   . Hypertension   . OCD (obsessive compulsive disorder)   . PONV (postoperative nausea and vomiting)   . Psychosis ([place])   . Sarcoidosis   . Sleep apnea    cpap    Past Surgical History:  Procedure Laterality Date  . BACK SURGERY     cervical neck  . CATARACT EXTRACTION W/PHACO  01/05/2012   Procedure: CATARACT EXTRACTION PHACO AND INTRAOCULAR LENS PLACEMENT (IOC);  Surgeon: [name] Branch, MD;  Location: [facility];  Service: Ophthalmology;  Laterality: Left;  CDE 18.47  . CATARACT EXTRACTION W/PHACO  02/02/2012   Procedure: CATARACT EXTRACTION PHACO AND INTRAOCULAR LENS PLACEMENT (IOC);  Surgeon: [name] Branch, MD;  Location: [facility];  Service: Ophthalmology;  Laterality: Right;  CDE:15.38  . CHOLECYSTECTOMY    . hemorhoidectomy    . MELANOMA EXCISION     left side-[name]  . TONSILLECTOMY      Social History   Socioeconomic History  . Marital status: Married    Spouse name: Not on file  . Number of children: Not on file  . Years of education: Not on file  . Highest education level: Not on file  Occupational History  . Occupation: retired     Tobacco Use  . Smoking status: Former Smoker    Packs/day: 0.25    Years: 20.00    Pack years: 5.00    Types: Pipe    Quit date: 01/02/1974    Years since quitting: 46.6  . Smokeless tobacco: Never Used  Vaping Use  . Vaping Use: Never used  Substance and Sexual Activity  . Alcohol use: No  . Drug use: No  . Sexual activity: Not Currently  Other Topics Concern  . Not on file  Social History Narrative   Former pipe smoker, quit many years ago.   Long term resident of [facility]      Social Determinants of Health   Financial Resource Strain: Low Risk   . Difficulty of Paying Living Expenses: Not hard at all  Food Insecurity: No Food Insecurity  . Worried About Charity fundraiser in the Last Year: Never true  . Ran Out of Food in the Last Year: Never true  Transportation Needs: No Transportation Needs  . Lack of Transportation (Medical): No  . Lack of Transportation (Non-Medical): No  Physical Activity: Inactive  . Days of Exercise per Week: 0 days  . Minutes of Exercise per Session: 0 min  Stress: No Stress Concern Present  .  Feeling of Stress : Not at all  Social Connections: Socially Isolated  . Frequency of Communication with Friends and Family: Never  . Frequency of Social Gatherings with Friends and Family: Never  . Attends Religious Services: Never  . Active Member of Clubs or Organizations: No  . Attends Archivist Meetings: Not asked  . Marital Status: Married  Human resources officer Violence: Not At Risk  . Fear of Current or Ex-Partner: No  . Emotionally Abused: No  . Physically Abused: No  . Sexually Abused: No   Family History  Problem Relation Age of Onset  . Heart disease Other   . Arthritis Other   . Cancer Other   . Anesthesia problems Neg Hx   . Hypotension Neg Hx   . Malignant hyperthermia Neg Hx   . Pseudochol deficiency Neg Hx       VITAL SIGNS BP 130/87   Pulse (!) 108   Temp (!) 97.5 F (36.4 C)   Resp 20   Ht 5\' 9"  (1.753 m)   Wt  202 lb 9.6 oz (91.9 kg)   SpO2 94%   BMI 29.92 kg/m   Outpatient Encounter Medications as of 08/21/2020  Medication Sig  . acetaminophen (TYLENOL) 500 MG tablet Take 500 mg by mouth every 6 (six) hours as needed. For pain   . allopurinol (ZYLOPRIM) 300 MG tablet Take 300 mg by mouth daily.    . Amino Acids-Protein Hydrolys (FEEDING SUPPLEMENT, PRO-STAT SUGAR FREE 64,) LIQD Take 30 mLs by mouth in the morning and at bedtime.  [name] apixaban (ELIQUIS) 5 MG TABS tablet Take 5 mg by mouth 2 (two) times daily.  [name] (LIPITOR) 20 MG tablet Take 20 mg by mouth every evening.  Roseanne Kaufman [place]-Castor Oil [facility]) OINT Special Instructions: Apply to sacrum/coccyx and bilateral buttocks qshift prn erythema.  . cetirizine (ZYRTEC) 10 MG tablet Take 10 mg by mouth at bedtime.  . donepezil (ARICEPT) 10 MG tablet Take 10 mg by mouth at bedtime.   . gabapentin (NEURONTIN) 100 MG capsule Take 100 mg by mouth at bedtime.  [name] ipratropium-albuterol (DUONEB) 0.5-2.5 (3) MG/3ML SOLN Take 3 mLs by nebulization every 6 (six) hours as needed.  . meloxicam (MOBIC) 7.5 MG tablet Take 7.5 mg by mouth daily as needed for pain.  . metoprolol succinate (TOPROL-XL) 25 MG 24 hr tablet Take 3 tablets (75 mg total) by mouth daily.  . midodrine (PROAMATINE) 5 MG tablet Take 1 tablet (5 mg total) by mouth 3 (three) times daily with meals.  . NON FORMULARY C-PAP from home, use home settings while sleeping At Bedtime 08:00 PM  . NON FORMULARY D1 puree and Nectar Thick diet continue NAS as ordered by physician.  . OXYGEN Inhale 2 L into the lungs continuous. for shortness of breath to keep 02 sat above 90%  . pantoprazole (PROTONIX) 40 MG tablet Take 1 tablet (40 mg total) by mouth daily.  . potassium chloride SA (KLOR-CON) 20 MEQ tablet Take 20 mEq by mouth daily.  [name] torsemide (DEMADEX) 20 MG tablet Take 20 mg by mouth 2 (two) times daily. For Edema  . Vitamins A & D (VITAMIN A & D) ointment 3 (three) times daily as needed  for dry skin. Apply A&D ointment to left heel qshift & prn for erythema.  . [DISCONTINUED] sodium chloride 0.45 % parenteral solution; 0.45 %; amt: 75 cc per hour; intravenous Special Instructions: for acute renal failure. Every Shift Day, Evening, Night  . [DISCONTINUED] Specialty  Vitamins Products (PROSTATE PO) Take 30 mLs by mouth 2 (two) times daily.   No facility-administered encounter medications on file as of 08/21/2020.     SIGNIFICANT DIAGNOSTIC EXAMS  PREVIOUS;   12-17-19: CT of head and cervical spine:  1. No acute intracranial pathology. Small-vessel white matter disease. 2. No fracture or static subluxation of the cervical spine. 3. Anterior cervical discectomy and fusion of C4 through C6, with incorporation of the disc spaces and bony ankylosis of the included levels inferior to this through T3. 4. Prominent osteophytes, disc calcifications and calcifications of the ligamentum flavum, which appear to significantly narrow the cervical canal at C3-C4, minimum AP diameter approximately 4 mm.  02-13-20: MRI lumbar spine:  1. L2 fracture involving the anterior wall and superior endplate, likely subacute. The appearance suggest a hyperextension mechanism. No height loss. 2. L3-L4 severe spinal canal stenosis with severe right and moderate left neural foraminal stenosis. 3. T12-L1 moderate spinal canal stenosis and severe bilateral neural foraminal stenosis. 4. L1-L2 and L2-L3 severe right neural foraminal stenosis. 5. L5-S1 moderate bilateral neural foraminal stenosis.   02-13-20: MRI cervical spine:  1. 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 2. C4-6 ACDF without spinal canal stenosis. 3. Mild bilateral C6-7 neural foraminal stenosis.  03-20-20: Myoview:  No diagnostic ST segment changes to indicate ischemia. Small, moderate intensity, apical septal and  apical to basal inferolateral defects. The inferolateral defect is fixed and consistent with possible scar and the apical septal defect is partially reversible consistent with a mild ischemic territory.This is a high risk study based on calculated LVEF. Would suggest confirmatory echocardiogram. Nuclear stress EF: 15%  04-10-20 2-d echo:  Left ventricular ejection fraction, by estimation, is 55 to 60%. The  left ventricle has normal function. The left ventricle has no regional  wall motion abnormalities. Left ventricular diastolic parameters are  consistent with age-related delayed  relaxation (normal).   07-20-20: chest x-Bayan:   Opacification of most of the right hemithorax due to a combination of pleural effusion and consolidation. There is also consolidation in the medial left base. Heart borderline enlarged. There are foci of left carotid artery calcification.  07-27-20 2-d echo:  Left ventricular ejection fraction, by estimation, is 60 to 65%. The  left ventricle has normal function. There is mild left ventricular hypertrophy   07-29-20 chest x-Duong:  1. Cardiomegaly. Pulmonary venous congestion. Bilateral interstitial prominence suggesting interstitial edema and or pneumonitis. Right-sided prominent pleural effusion again noted, decreased in size from prior chest x-Jesstin. 2.  Carotid vascular disease  07-28-20: right thoracentesis: Successful ultrasound guided RIGHT thoracentesis yielding 1280 mL of pleural fluid.  07-28-20: chest x-Slyvester:  No pneumothorax following thoracentesis. Residual bibasilar pleural effusions and atelectasis.  07-31-20: swallow study: Recommend continue with D1/puree diet and NECTAR THICK liquids with meds crushed in puree.   08-10-20: chest x-[place]: chf with mild to moderate right effusion seen. Mild interval progression noted.   NO NEW EXAMS.    LABS REVIEWED PREVIOUS;   08-23-19: hgb a1c 5.6  10-05-19: wbc 4.8; hgb 11.8; hct 36.2; mcv 89.8 plt 256; glucose 100; bun  18; creat 0.53 ;k+ 3.9; an++ 128; ca 8.6; d-dimer: 3.36 ferritin 965 10-16-19: glucose 96; bun 14; creat 0.47; k+ 3.7; na++ 134; ca 8.5 12-02-19: wbc 5.9; hgb 12.4; hct 38.9; mcv 91.1 plt 227; chol 96; ldl 43; trig 45; hdl 44  01-30-20: chol 96; ldl 43; trig  45; hdl 44  06-11-20: wbc 5.5; hgb 12.5; hct 39.0 mcv 92.6 plt 184; glucose 99; bun 26; creat 0.68; k+ 4.6; na++ 131; ca 8.9 liver normal albumin 3.5 hgb a1c 5.5; tsh 2.617 07-20-20: wbc 5.1; hgb 13.0; hct 41.2; mcv 90.7 plt 176; glucose 110; bun 25; creat 0.52; k+ 4.5; na++ 129; ca 8.7  07-21-20: wbc 5.7; hgb 13.5; hct 42.0; mcv 91.1 plt 171 glucose 106; bun 21; creat 0.58; k+ 4.1; na++ 132; ca 9.0 07-23-20; glucose 110; bun 20; creat 0.62; k+ 3.4; na++ 133; ca 8.7 liver normal albumin 3.2  07-27-20: wbc 8.4; hgb 13.6; hct 41.4; mcv 90.6 plt 133; glucose 104; bun 29; creat 0.88; k+ 3.5; na++ 138; ca 8.4 liver normal albumin 3.4 tsh 2.324 blood and urine culture: no growth 07-29-20: wbc 8.1; hgb 13.0; hct 42.1; mcv 91.9 plt 145; glucose 101; bun 27; creat 0.81; k+ 3.5; na++ 139; ca 8.4 mag 1.8 08-01-20: wbc 7.4; hgb 14.3; hct 45.8; mcv 91.6 plt 169; glucose 89; bun 24; creat 0.74; k+ 4.7; na++ 140; ca 8.5 mag 1.8  08-13-20: BNP 432.0  TODAY  08-20-20: glucose 113; bun 81; creat 1.46; k+ 4.2; na++ 150; ca 9.6 08-21-20: glucose 103; bun 66; creat 1.19 k+ 3.8; na++ 147; ca 8.8   Review of Systems  Constitutional: Negative for malaise/fatigue.  Respiratory: Negative for cough and shortness of breath.   Cardiovascular: Negative for chest pain, palpitations and leg swelling.  Gastrointestinal: Negative for abdominal pain, constipation and heartburn.  Musculoskeletal: Negative for back pain, joint pain and myalgias.  Skin: Negative.   Neurological: Negative for dizziness.  Psychiatric/Behavioral: The patient is not nervous/anxious.     Physical Exam Constitutional:      General: He is not in acute distress.    Appearance: He is well-developed. He  is not diaphoretic.  Eyes:     Comments: Bilateral cataract with lens implants   Neck:     Thyroid: No thyromegaly.  Cardiovascular:     Rate and Rhythm: Normal rate and regular rhythm.     Pulses: Normal pulses.     Heart sounds: Normal heart sounds.  Pulmonary:     Effort: Pulmonary effort is normal. No respiratory distress.     Breath sounds: Wheezing present.     Comments: Few scattered  Abdominal:     General: Bowel sounds are normal. There is no distension.     Palpations: Abdomen is soft.     Tenderness: There is no abdominal tenderness.  Musculoskeletal:     Cervical back: Neck supple.     Right lower leg: Edema present.     Left lower leg: Edema present.     Comments: Able to move all extremities Has trace bilateral lower extremity edema      Lymphadenopathy:     Cervical: No cervical adenopathy.  Skin:    General: Skin is warm and dry.  Neurological:     Mental Status: He is alert. Mental status is at baseline.  Psychiatric:        Mood and Affect: Mood normal.       ASSESSMENT/ PLAN:  TODAY  1. Acute renal failure unspecified acute renal failure type 2. Hypernatremia  Is stable Will stop IVF at this time Will monitor his status.  Will repeat BMP 08-24-20  MD is aware of resident's narcotic use and is in agreement with current plan of care. We will attempt to wean resident as appropriate.  Ok Edwards NP [facility]  Contact [phone] Monday through Friday 8am- 5pm  After hours call [phone]

## 2020-08-24 ENCOUNTER — Encounter (HOSPITAL_COMMUNITY)
Admission: RE | Admit: 2020-08-24 | Discharge: 2020-08-24 | Disposition: A | Discharge status: Home / Self Care | Payer: Medicare Other | Source: Skilled Nursing Facility | Attending: Adult Health | Admitting: Adult Health

## 2020-08-24 ENCOUNTER — Encounter: Payer: Self-pay | Admitting: Adult Health

## 2020-08-24 ENCOUNTER — Non-Acute Institutional Stay (SKILLED_NURSING_FACILITY): Payer: Medicare Other | Admitting: Adult Health

## 2020-08-24 DIAGNOSIS — E785 Hyperlipidemia, unspecified: Secondary | ICD-10-CM

## 2020-08-24 DIAGNOSIS — I110 Hypertensive heart disease with heart failure: Secondary | ICD-10-CM

## 2020-08-24 DIAGNOSIS — I10 Essential (primary) hypertension: Secondary | ICD-10-CM | POA: Insufficient documentation

## 2020-08-24 DIAGNOSIS — I5032 Chronic diastolic (congestive) heart failure: Secondary | ICD-10-CM | POA: Diagnosis not present

## 2020-08-24 DIAGNOSIS — M5412 Radiculopathy, cervical region: Secondary | ICD-10-CM | POA: Diagnosis not present

## 2020-08-24 LAB — BASIC METABOLIC PANEL
Anion gap: 11 (ref 5–15)
BUN: 43 mg/dL — ABNORMAL HIGH (ref 8–23)
CO2: 28 mmol/L (ref 22–32)
Calcium: 9.2 mg/dL (ref 8.9–10.3)
Chloride: 105 mmol/L (ref 98–111)
Creatinine, Ser: 0.98 mg/dL (ref 0.61–1.24)
GFR, Estimated: >60 mL/min (ref >60)
Glucose, Bld: 108 mg/dL — ABNORMAL HIGH (ref 70–99)
Potassium: 3.6 mmol/L (ref 3.5–5.1)
Sodium: 144 mmol/L (ref 135–145)

## 2020-08-24 NOTE — Progress Notes (Signed)
Location:    [facility] Room Number: 127/D Place of Service:  SNF (31)   CODE STATUS: DNR  Allergies  Allergen Reactions  . Other   . Penicillin G   . Sulfonic Acid (3,5-Dibromo-4-H-Ox-Benz)   . Penicillins Rash  . Sulfonamide Derivatives Rash  . Tetanus Toxoid Rash    Chief Complaint  Patient presents with  . Short Term Rehab (STR)          Hypertensive heart disease with chronic diastolic congestive heart failure:    Radiculopathy of cervical spine:  Dyslipidemia   Weekly follow up for the first 30 days post hospitalization.     HPI:  He is a 86 year old long term resident of this facility being seen for the management of his chronic illnesses: Hypertensive heart disease with chronic diastolic congestive heart failure:    Radiculopathy of cervical spine:  Dyslipidemia. He has been treated for ARF with hypernatremia this past week with 0.45 NS. There are no reports of uncontrolled pain; no reports of anxiety or agitation.   Past Medical History:  Diagnosis Date  . Cancer (HCC)    melanoma-left side  . Chronic pain    legs and feet  . Coronary artery disease   . Dementia ([place])   . Diabetes mellitus without complication ([place])   . Foot drop   . GERD (gastroesophageal reflux disease)   . Gout   . Hyperlipidemia   . Hypertension   . OCD (obsessive compulsive disorder)   . PONV (postoperative nausea and vomiting)   . Psychosis ([place])   . Sarcoidosis   . Sleep apnea    cpap    Past Surgical History:  Procedure Laterality Date  . BACK SURGERY     cervical neck  . CATARACT EXTRACTION W/PHACO  01/05/2012   Procedure: CATARACT EXTRACTION PHACO AND INTRAOCULAR LENS PLACEMENT (IOC);  Surgeon: [name] Branch, MD;  Location: [facility];  Service: Ophthalmology;  Laterality: Left;  CDE 18.47  . CATARACT EXTRACTION W/PHACO  02/02/2012   Procedure: CATARACT EXTRACTION PHACO AND INTRAOCULAR LENS PLACEMENT (IOC);  Surgeon: [name] Branch, MD;  Location: [facility];  Service:  Ophthalmology;  Laterality: Right;  CDE:15.38  . CHOLECYSTECTOMY    . hemorhoidectomy    . MELANOMA EXCISION     left side-[name]  . TONSILLECTOMY      Social History   Socioeconomic History  . Marital status: Married    Spouse name: Not on file  . Number of children: Not on file  . Years of education: Not on file  . Highest education level: Not on file  Occupational History  . Occupation: retired   Tobacco Use  . Smoking status: Former Smoker    Packs/day: 0.25    Years: 20.00    Pack years: 5.00    Types: Pipe    Quit date: 01/02/1974    Years since quitting: 46.6  . Smokeless tobacco: Never Used  Vaping Use  . Vaping Use: Never used  Substance and Sexual Activity  . Alcohol use: No  . Drug use: No  . Sexual activity: Not Currently  Other Topics Concern  . Not on file  Social History Narrative   Former pipe smoker, quit many years ago.   Long term resident of [facility]      Social Determinants of Health   Financial Resource Strain: Low Risk   . Difficulty of Paying Living Expenses: Not hard at all  Food Insecurity: No Food Insecurity  .  Worried About Charity fundraiser in the Last Year: Never true  . Ran Out of Food in the Last Year: Never true  Transportation Needs: No Transportation Needs  . Lack of Transportation (Medical): No  . Lack of Transportation (Non-Medical): No  Physical Activity: Inactive  . Days of Exercise per Week: 0 days  . Minutes of Exercise per Session: 0 min  Stress: No Stress Concern Present  . Feeling of Stress : Not at all  Social Connections: Socially Isolated  . Frequency of Communication with Friends and Family: Never  . Frequency of Social Gatherings with Friends and Family: Never  . Attends Religious Services: Never  . Active Member of Clubs or Organizations: No  . Attends Archivist Meetings: Not asked  . Marital Status: Married  Human resources officer Violence: Not At Risk  . Fear of Current or Ex-Partner: No  .  Emotionally Abused: No  . Physically Abused: No  . Sexually Abused: No   Family History  Problem Relation Age of Onset  . Heart disease Other   . Arthritis Other   . Cancer Other   . Anesthesia problems Neg Hx   . Hypotension Neg Hx   . Malignant hyperthermia Neg Hx   . Pseudochol deficiency Neg Hx       VITAL SIGNS BP 121/86   Pulse 60   Temp 98 F (36.7 C)   Resp 20   Ht 5\' 9"  (1.753 m)   Wt 202 lb 9.6 oz (91.9 kg)   SpO2 94%   BMI 29.92 kg/m   Outpatient Encounter Medications as of 08/24/2020  Medication Sig  . acetaminophen (TYLENOL) 500 MG tablet Take 500 mg by mouth every 6 (six) hours as needed. For pain   . allopurinol (ZYLOPRIM) 300 MG tablet Take 300 mg by mouth daily.    . Amino Acids-Protein Hydrolys (FEEDING SUPPLEMENT, PRO-STAT SUGAR FREE 64,) LIQD Take 30 mLs by mouth in the morning and at bedtime.  [name] apixaban (ELIQUIS) 5 MG TABS tablet Take 5 mg by mouth 2 (two) times daily.  [name] (LIPITOR) 20 MG tablet Take 20 mg by mouth every evening.  Roseanne Kaufman [place] [facility]) OINT Special Instructions: Apply to sacrum/coccyx and bilateral buttocks qshift prn erythema.  . cetirizine (ZYRTEC) 10 MG tablet Take 10 mg by mouth at bedtime.  . donepezil (ARICEPT) 10 MG tablet Take 10 mg by mouth at bedtime.   . gabapentin (NEURONTIN) 100 MG capsule Take 100 mg by mouth at bedtime.  [name] ipratropium-albuterol (DUONEB) 0.5-2.5 (3) MG/3ML SOLN Take 3 mLs by nebulization every 6 (six) hours as needed.  . meloxicam (MOBIC) 7.5 MG tablet Take 7.5 mg by mouth daily as needed for pain.  . metoprolol succinate (TOPROL-XL) 25 MG 24 hr tablet Take 3 tablets (75 mg total) by mouth daily.  . midodrine (PROAMATINE) 5 MG tablet Take 1 tablet (5 mg total) by mouth 3 (three) times daily with meals.  . NON FORMULARY C-PAP from home, use home settings while sleeping At Bedtime 08:00 PM  . NON FORMULARY D1 puree and Nectar Thick diet continue NAS as ordered by physician.  .  OXYGEN Inhale 2 L into the lungs continuous. for shortness of breath to keep 02 sat above 90%  . pantoprazole (PROTONIX) 40 MG tablet Take 1 tablet (40 mg total) by mouth daily.  . potassium chloride SA (KLOR-CON) 20 MEQ tablet Take 20 mEq by mouth daily.  [name] torsemide (DEMADEX) 20 MG tablet Take 20  mg by mouth 2 (two) times daily. For Edema  . Vitamins A & D (VITAMIN A & D) ointment 3 (three) times daily as needed for dry skin. Apply A&D ointment to left heel qshift & prn for erythema.   No facility-administered encounter medications on file as of 08/24/2020.     SIGNIFICANT DIAGNOSTIC EXAMS   PREVIOUS;   12-17-19: CT of head and cervical spine:  1. No acute intracranial pathology. Small-vessel white matter disease. 2. No fracture or static subluxation of the cervical spine. 3. Anterior cervical discectomy and fusion of C4 through C6, with incorporation of the disc spaces and bony ankylosis of the included levels inferior to this through T3. 4. Prominent osteophytes, disc calcifications and calcifications of the ligamentum flavum, which appear to significantly narrow the cervical canal at C3-C4, minimum AP diameter approximately 4 mm.  02-13-20: MRI lumbar spine:  1. L2 fracture involving the anterior wall and superior endplate, likely subacute. The appearance suggest a hyperextension mechanism. No height loss. 2. L3-L4 severe spinal canal stenosis with severe right and moderate left neural foraminal stenosis. 3. T12-L1 moderate spinal canal stenosis and severe bilateral neural foraminal stenosis. 4. L1-L2 and L2-L3 severe right neural foraminal stenosis. 5. L5-S1 moderate bilateral neural foraminal stenosis.   02-13-20: MRI cervical spine:  1. 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 2. C4-6 ACDF without spinal canal stenosis. 3. Mild bilateral C6-7 neural foraminal  stenosis.  03-20-20: Myoview:  No diagnostic ST segment changes to indicate ischemia. Small, moderate intensity, apical septal and apical to basal inferolateral defects. The inferolateral defect is fixed and consistent with possible scar and the apical septal defect is partially reversible consistent with a mild ischemic territory.This is a high risk study based on calculated LVEF. Would suggest confirmatory echocardiogram. Nuclear stress EF: 15%  04-10-20 2-d echo:  Left ventricular ejection fraction, by estimation, is 55 to 60%. The  left ventricle has normal function. The left ventricle has no regional  wall motion abnormalities. Left ventricular diastolic parameters are  consistent with age-related delayed  relaxation (normal).   07-20-20: chest x-Narciso:   Opacification of most of the right hemithorax due to a combination of pleural effusion and consolidation. There is also consolidation in the medial left base. Heart borderline enlarged. There are foci of left carotid artery calcification.  07-27-20 2-d echo:  Left ventricular ejection fraction, by estimation, is 60 to 65%. The  left ventricle has normal function. There is mild left ventricular hypertrophy   07-29-20 chest x-Antrone:  1. Cardiomegaly. Pulmonary venous congestion. Bilateral interstitial prominence suggesting interstitial edema and or pneumonitis. Right-sided prominent pleural effusion again noted, decreased in size from prior chest x-Jhair. 2.  Carotid vascular disease  07-28-20: right thoracentesis: Successful ultrasound guided RIGHT thoracentesis yielding 1280 mL of pleural fluid.  07-28-20: chest x-Juelz:  No pneumothorax following thoracentesis. Residual bibasilar pleural effusions and atelectasis.  07-31-20: swallow study: Recommend continue with D1/puree diet and NECTAR THICK liquids with meds crushed in puree.   08-10-20: chest x-Teyon: chf with mild to moderate right effusion seen. Mild interval progression noted.   NO NEW  EXAMS.    LABS REVIEWED PREVIOUS;   08-23-19: hgb a1c 5.6  10-05-19: wbc 4.8; hgb 11.8; hct 36.2; mcv 89.8 plt 256; glucose 100; bun 18; creat 0.53 ;k+ 3.9; an++ 128; ca 8.6; d-dimer: 3.36 ferritin 965 10-16-19: glucose 96; bun 14; creat 0.47; k+ 3.7; na++ 134;  ca 8.5 12-02-19: wbc 5.9; hgb 12.4; hct 38.9; mcv 91.1 plt 227; chol 96; ldl 43; trig 45; hdl 44  01-30-20: chol 96; ldl 43; trig 45; hdl 44  06-11-20: wbc 5.5; hgb 12.5; hct 39.0 mcv 92.6 plt 184; glucose 99; bun 26; creat 0.68; k+ 4.6; na++ 131; ca 8.9 liver normal albumin 3.5 hgb a1c 5.5; tsh 2.617 07-20-20: wbc 5.1; hgb 13.0; hct 41.2; mcv 90.7 plt 176; glucose 110; bun 25; creat 0.52; k+ 4.5; na++ 129; ca 8.7  07-21-20: wbc 5.7; hgb 13.5; hct 42.0; mcv 91.1 plt 171 glucose 106; bun 21; creat 0.58; k+ 4.1; na++ 132; ca 9.0 07-23-20; glucose 110; bun 20; creat 0.62; k+ 3.4; na++ 133; ca 8.7 liver normal albumin 3.2  07-27-20: wbc 8.4; hgb 13.6; hct 41.4; mcv 90.6 plt 133; glucose 104; bun 29; creat 0.88; k+ 3.5; na++ 138; ca 8.4 liver normal albumin 3.4 tsh 2.324 blood and urine culture: no growth 07-29-20: wbc 8.1; hgb 13.0; hct 42.1; mcv 91.9 plt 145; glucose 101; bun 27; creat 0.81; k+ 3.5; na++ 139; ca 8.4 mag 1.8 08-01-20: wbc 7.4; hgb 14.3; hct 45.8; mcv 91.6 plt 169; glucose 89; bun 24; creat 0.74; k+ 4.7; na++ 140; ca 8.5 mag 1.8  08-13-20: BNP 432.0  TODAY  08-20-20: glucose 113; bun 81; creat 1.46; k+ 4.2; na++ 150; ca 9.6 08-21-20: glucose 103; bun 66; creat 1.19 k+ 3.8; na++ 147; ca 8.8  08-24-20: glucose 108; bun 43; creat 0.98; k+ 3.6; na++ 144; ca 9.2   Review of Systems  Constitutional: Negative for malaise/fatigue.  Respiratory: Negative for cough and shortness of breath.   Cardiovascular: Negative for chest pain, palpitations and leg swelling.  Gastrointestinal: Negative for abdominal pain, constipation and heartburn.  Musculoskeletal: Negative for back pain, joint pain and myalgias.  Skin: Negative.   Neurological:  Negative for dizziness.  Psychiatric/Behavioral: The patient is not nervous/anxious.    Physical Exam Constitutional:      General: He is not in acute distress.    Appearance: He is well-developed. He is not diaphoretic.  Eyes:     Comments: Bilateral cataract with lens implants    Neck:     Thyroid: No thyromegaly.  Cardiovascular:     Rate and Rhythm: Normal rate and regular rhythm.     Pulses: Normal pulses.     Heart sounds: Normal heart sounds.  Pulmonary:     Effort: Pulmonary effort is normal. No respiratory distress.     Breath sounds: Wheezing present.     Comments: Few scattered  Abdominal:     General: Bowel sounds are normal. There is no distension.     Palpations: Abdomen is soft.     Tenderness: There is no abdominal tenderness.  Musculoskeletal:     Cervical back: Neck supple.     Right lower leg: Edema present.     Left lower leg: Edema present.     Comments: Able to move all extremities Has trace bilateral lower extremity edema  Lymphadenopathy:     Cervical: No cervical adenopathy.  Skin:    General: Skin is warm and dry.  Neurological:     Mental Status: He is alert. Mental status is at baseline.  Psychiatric:        Mood and Affect: Mood normal.      ASSESSMENT/ PLAN:   TODAY  1. Hypertensive heart disease with chronic diastolic congestive heart failure: is stable b/b 121/86 will continue toprol xl 75 mg daily  2. Radiculopathy of cervical spine: is stable will continue gabapentin 100 mg daily mobic has been stopped.   3. Dyslipidemia is stable LDL 43 will continue lipitor 20 mg daily    PREVIOUS     4. Sleep apnea obstructive: is stable will decline CPAP most times  5. Idiopathic chronic gout of multiple sites without tophi: is stable will continue allopurinol 300 mg daily   6. COPD with hypoxia/history of sarcoidosis: is stable will continue duoneb every 6 hours as needed  7. History of melanoma is stable history of excision of left  flank  8. Coronary artery disease of native artery of native heart with stable angina: is stable will continue toprol xl 75 mg daily   9 Chronic diastolic congestive heart failure: is stable: EF 60-65% (07-27-20)  will continue: demadex 20 mg twice daily with k+ 20 meq daily;    10. Atrial fibrillation with rapid ventricular response: heart rate is stable will continue toprol xl 75 mg daily for rate control and eliquis 5 mg twice daily   11. Hypotension due to drugs: is stable b/p 121/86 will continue midodrine 5 mg three times daily   12. Right pleural effusion: status post thoracentesis 07-28-20. Is still present on x-Fairley 08-10-20    13. GERD without esophagitis: is stable will continue protonix 40 mg daily   14. Vascular dementia without behavioral disturbance: is without change weight is 200 pounds; will continue aricept 10 mg daily  15. Chronic non-seasonal allergic rhinitis: will continue zyrtec 10 mg daily    MD is aware of resident's narcotic use and is in agreement with current plan of care. We will attempt to wean resident as appropriate.  Ok Edwards NP [facility]  Contact [phone] Monday through Friday 8am- 5pm  After hours call [phone]

## 2020-08-25 DIAGNOSIS — N179 Acute kidney failure, unspecified: Secondary | ICD-10-CM | POA: Insufficient documentation

## 2020-08-25 DIAGNOSIS — E870 Hyperosmolality and hypernatremia: Secondary | ICD-10-CM | POA: Insufficient documentation

## 2020-08-26 ENCOUNTER — Observation Stay (HOSPITAL_COMMUNITY)
Admission: EM | Admit: 2020-08-26 | Discharge: 2020-08-27 | Disposition: A | Discharge status: Home / Self Care | Payer: Medicare Other | Attending: Family Medicine | Admitting: Family Medicine

## 2020-08-26 ENCOUNTER — Other Ambulatory Visit: Payer: Self-pay

## 2020-08-26 ENCOUNTER — Encounter (HOSPITAL_COMMUNITY): Payer: Self-pay

## 2020-08-26 ENCOUNTER — Emergency Department (HOSPITAL_COMMUNITY): Payer: Medicare Other

## 2020-08-26 DIAGNOSIS — I5032 Chronic diastolic (congestive) heart failure: Secondary | ICD-10-CM | POA: Diagnosis not present

## 2020-08-26 DIAGNOSIS — I10 Essential (primary) hypertension: Secondary | ICD-10-CM | POA: Diagnosis present

## 2020-08-26 DIAGNOSIS — E119 Type 2 diabetes mellitus without complications: Secondary | ICD-10-CM | POA: Diagnosis not present

## 2020-08-26 DIAGNOSIS — J449 Chronic obstructive pulmonary disease, unspecified: Secondary | ICD-10-CM | POA: Diagnosis present

## 2020-08-26 DIAGNOSIS — I4891 Unspecified atrial fibrillation: Secondary | ICD-10-CM | POA: Diagnosis not present

## 2020-08-26 DIAGNOSIS — Z9889 Other specified postprocedural states: Secondary | ICD-10-CM

## 2020-08-26 DIAGNOSIS — Z85820 Personal history of malignant melanoma of skin: Secondary | ICD-10-CM | POA: Diagnosis not present

## 2020-08-26 DIAGNOSIS — Z87891 Personal history of nicotine dependence: Secondary | ICD-10-CM | POA: Diagnosis not present

## 2020-08-26 DIAGNOSIS — R0600 Dyspnea, unspecified: Secondary | ICD-10-CM | POA: Insufficient documentation

## 2020-08-26 DIAGNOSIS — Z79899 Other long term (current) drug therapy: Secondary | ICD-10-CM | POA: Insufficient documentation

## 2020-08-26 DIAGNOSIS — F0150 Vascular dementia without behavioral disturbance: Secondary | ICD-10-CM | POA: Insufficient documentation

## 2020-08-26 DIAGNOSIS — R0602 Shortness of breath: Secondary | ICD-10-CM

## 2020-08-26 DIAGNOSIS — Z7901 Long term (current) use of anticoagulants: Secondary | ICD-10-CM | POA: Diagnosis not present

## 2020-08-26 DIAGNOSIS — I2510 Atherosclerotic heart disease of native coronary artery without angina pectoris: Secondary | ICD-10-CM | POA: Insufficient documentation

## 2020-08-26 DIAGNOSIS — J90 Pleural effusion, not elsewhere classified: Secondary | ICD-10-CM | POA: Diagnosis not present

## 2020-08-26 DIAGNOSIS — Z20822 Contact with and (suspected) exposure to covid-19: Secondary | ICD-10-CM | POA: Insufficient documentation

## 2020-08-26 DIAGNOSIS — K219 Gastro-esophageal reflux disease without esophagitis: Secondary | ICD-10-CM | POA: Diagnosis present

## 2020-08-26 DIAGNOSIS — R0902 Hypoxemia: Secondary | ICD-10-CM | POA: Insufficient documentation

## 2020-08-26 DIAGNOSIS — Z9981 Dependence on supplemental oxygen: Secondary | ICD-10-CM | POA: Diagnosis not present

## 2020-08-26 DIAGNOSIS — I119 Hypertensive heart disease without heart failure: Secondary | ICD-10-CM | POA: Diagnosis not present

## 2020-08-26 DIAGNOSIS — Z7982 Long term (current) use of aspirin: Secondary | ICD-10-CM | POA: Insufficient documentation

## 2020-08-26 DIAGNOSIS — M1A9XX0 Chronic gout, unspecified, without tophus (tophi): Secondary | ICD-10-CM | POA: Diagnosis present

## 2020-08-26 DIAGNOSIS — I110 Hypertensive heart disease with heart failure: Secondary | ICD-10-CM | POA: Insufficient documentation

## 2020-08-26 LAB — CBC WITH DIFFERENTIAL/PLATELET
Abs Immature Granulocytes: 0.02 10*3/uL (ref 0.00–0.07)
Basophils Absolute: 0.1 10*3/uL (ref 0.0–0.1)
Basophils Relative: 1 %
Eosinophils Absolute: 0.4 10*3/uL (ref 0.0–0.5)
Eosinophils Relative: 6 %
HCT: 46.5 % (ref 39.0–52.0)
Hemoglobin: 14.4 g/dL (ref 13.0–17.0)
Immature Granulocytes: 0 %
Lymphocytes Relative: 18 %
Lymphs Abs: 1.4 10*3/uL (ref 0.7–4.0)
MCH: 28.6 pg (ref 26.0–34.0)
MCHC: 31 g/dL (ref 30.0–36.0)
MCV: 92.3 fL (ref 80.0–100.0)
Monocytes Absolute: 0.7 10*3/uL (ref 0.1–1.0)
Monocytes Relative: 9 %
Neutro Abs: 4.9 10*3/uL (ref 1.7–7.7)
Neutrophils Relative %: 66 %
Platelets: 225 10*3/uL (ref 150–400)
RBC: 5.04 MIL/uL (ref 4.22–5.81)
RDW: 17.5 % — ABNORMAL HIGH (ref 11.5–15.5)
WBC: 7.4 10*3/uL (ref 4.0–10.5)
nRBC: 0 % (ref 0.0–0.2)

## 2020-08-26 LAB — RESPIRATORY PANEL BY RT PCR (FLU A&B, COVID)
Influenza A by PCR: NEGATIVE
Influenza B by PCR: NEGATIVE
SARS Coronavirus 2 by RT PCR: NEGATIVE

## 2020-08-26 LAB — COMPREHENSIVE METABOLIC PANEL
ALT: 16 U/L (ref 0–44)
AST: 21 U/L (ref 15–41)
Albumin: 3.3 g/dL — ABNORMAL LOW (ref 3.5–5.0)
Alkaline Phosphatase: 110 U/L (ref 38–126)
Anion gap: 14 (ref 5–15)
BUN: 33 mg/dL — ABNORMAL HIGH (ref 8–23)
CO2: 30 mmol/L (ref 22–32)
Calcium: 9.5 mg/dL (ref 8.9–10.3)
Chloride: 100 mmol/L (ref 98–111)
Creatinine, Ser: 0.91 mg/dL (ref 0.61–1.24)
GFR, Estimated: >60 mL/min (ref >60)
Glucose, Bld: 94 mg/dL (ref 70–99)
Potassium: 3.8 mmol/L (ref 3.5–5.1)
Sodium: 144 mmol/L (ref 135–145)
Total Bilirubin: 1.4 mg/dL — ABNORMAL HIGH (ref 0.3–1.2)
Total Protein: 6.3 g/dL — ABNORMAL LOW (ref 6.5–8.1)

## 2020-08-26 MED ORDER — IPRATROPIUM-ALBUTEROL 0.5-2.5 (3) MG/3ML IN SOLN: 3.0000 mL | Freq: Four times a day (QID) | RESPIRATORY_TRACT | Status: DC | PRN

## 2020-08-26 MED ORDER — GABAPENTIN 100 MG PO CAPS
100.0000 mg | ORAL_CAPSULE | Freq: Every day | ORAL | Status: DC
  Administered 2020-08-26: 100 mg via ORAL
  Filled 2020-08-26: qty 1

## 2020-08-26 MED ORDER — APIXABAN 5 MG PO TABS
5.0000 mg | ORAL_TABLET | Freq: Two times a day (BID) | ORAL | Status: DC
  Administered 2020-08-26 – 2020-08-27 (×2): 5 mg via ORAL
  Filled 2020-08-26 (×2): qty 1

## 2020-08-26 MED ORDER — DILTIAZEM HCL-DEXTROSE 125-5 MG/125ML-% IV SOLN (PREMIX): 5.0000 mg/h | INTRAVENOUS | Status: DC

## 2020-08-26 MED ORDER — METOPROLOL SUCCINATE ER 50 MG PO TB24
75.0000 mg | ORAL_TABLET | Freq: Every day | ORAL | Status: DC
  Administered 2020-08-26 – 2020-08-27 (×2): 75 mg via ORAL
  Filled 2020-08-26: qty 1
  Filled 2020-08-26: qty 3

## 2020-08-26 MED ORDER — ENSURE ENLIVE PO LIQD
237.0000 mL | Freq: Two times a day (BID) | ORAL | Status: DC
  Administered 2020-08-27 (×2): 237 mL via ORAL

## 2020-08-26 MED ORDER — TORSEMIDE 20 MG PO TABS
20.0000 mg | ORAL_TABLET | Freq: Two times a day (BID) | ORAL | Status: DC
  Administered 2020-08-26 – 2020-08-27 (×2): 20 mg via ORAL
  Filled 2020-08-26 (×6): qty 1

## 2020-08-26 MED ORDER — PANTOPRAZOLE SODIUM 40 MG PO TBEC
40.0000 mg | DELAYED_RELEASE_TABLET | Freq: Every day | ORAL | Status: DC
  Administered 2020-08-27: 40 mg via ORAL
  Filled 2020-08-26: qty 1

## 2020-08-26 MED ORDER — DONEPEZIL HCL 5 MG PO TABS
10.0000 mg | ORAL_TABLET | Freq: Every day | ORAL | Status: DC
  Administered 2020-08-26: 10 mg via ORAL
  Filled 2020-08-26: qty 2

## 2020-08-26 MED ORDER — ACETAMINOPHEN 500 MG PO TABS: 500.0000 mg | ORAL_TABLET | Freq: Four times a day (QID) | ORAL | Status: DC | PRN

## 2020-08-26 MED ORDER — ALLOPURINOL 300 MG PO TABS
300.0000 mg | ORAL_TABLET | Freq: Every day | ORAL | Status: DC
  Administered 2020-08-27: 300 mg via ORAL
  Filled 2020-08-26 (×3): qty 1

## 2020-08-26 MED ORDER — DILTIAZEM HCL 25 MG/5ML IV SOLN
10.0000 mg | Freq: Once | INTRAVENOUS | Status: AC
  Administered 2020-08-26: 10 mg via INTRAVENOUS
  Filled 2020-08-26: qty 5

## 2020-08-26 MED ORDER — ATORVASTATIN CALCIUM 20 MG PO TABS
20.0000 mg | ORAL_TABLET | Freq: Every evening | ORAL | Status: DC
  Administered 2020-08-26: 20 mg via ORAL
  Filled 2020-08-26: qty 1

## 2020-08-26 MED ORDER — MIDODRINE HCL 5 MG PO TABS
5.0000 mg | ORAL_TABLET | Freq: Three times a day (TID) | ORAL | Status: DC
  Administered 2020-08-27 (×2): 5 mg via ORAL
  Filled 2020-08-26 (×8): qty 1

## 2020-08-26 NOTE — H&P (Addendum)
TRH H&P   Patient Demographics:    Eric Bowman, is a 86 y.o. male  MRN: [id number]   DOB - 02/13/1933  Admit Date - 08/26/2020  Outpatient Primary MD for the patient is Gerlene Fee, NP  Referring MD/NP/PA: Dr Rogene Houston  Patient coming from: [facility]  Chief Complaint  Patient presents with  . Shortness of Breath      HPI:    Eric Bowman  is a 86 y.o. male, with medical history significant fordementia, cervical myelopathy with inability to ambulate, CAD, OSA, gout, hyperlipidemia, hypertension, and GERD with recent hospitalization diagnosed with A. fib, started on metoprolol and Eliquis, as well work-up significant for moderate subpleural effusion status post thoracentesis(likely transudate but no LDH was done), patient was sent from [facility] for shortness of breath and low oxygen levels, patient was noted to have some hypoxia at the facility, and some increased work of breathing, which prompted him to bring him to ED, per ED physician he was saturating 86% where he is requiring 2 L nasal cannula at this point, as well he was noted to be in A. fib with RVR heart rate in the 140s, patient he is demented and very poor historian, but overall he is nodding yes to complaining of shortness of breath, during recent hospitalization he had thoracentesis done, no malignant cells could be identified, he did follow with pulmonary as an outpatient for effusion. - in ED patient chest x-Nyles significant for moderate pleural effusion, he was in A. fib with RVR heart rate in the 140s, then 1.09, normal white blood cell count at 7.4, his RVR and pleural effusion [facility] consulted to admit.    Review of systems:    In addition to the HPI above, patient with dementia, very poor historian, review of system is unreliable .    With Past History of the following :    Past Medical  History:  Diagnosis Date  . Cancer (HCC)    melanoma-left side  . Chronic pain    legs and feet  . Coronary artery disease   . Dementia ([place])   . Diabetes mellitus without complication ([place])   . Foot drop   . GERD (gastroesophageal reflux disease)   . Gout   . Hyperlipidemia   . Hypertension   . OCD (obsessive compulsive disorder)   . PONV (postoperative nausea and vomiting)   . Psychosis ([place])   . Sarcoidosis   . Sleep apnea    cpap      Past Surgical History:  Procedure Laterality Date  . BACK SURGERY     cervical neck  . CATARACT EXTRACTION W/PHACO  01/05/2012   Procedure: CATARACT EXTRACTION PHACO AND INTRAOCULAR LENS PLACEMENT (IOC);  Surgeon: [name] Branch, MD;  Location: [facility];  Service: Ophthalmology;  Laterality: Left;  CDE 18.47  . CATARACT EXTRACTION W/PHACO  02/02/2012   Procedure: CATARACT  EXTRACTION PHACO AND INTRAOCULAR LENS PLACEMENT (IOC);  Surgeon: [name] Branch, MD;  Location: [facility];  Service: Ophthalmology;  Laterality: Right;  CDE:15.38  . CHOLECYSTECTOMY    . hemorhoidectomy    . MELANOMA EXCISION     left side-[name]  . TONSILLECTOMY        Social History:     Social History   Tobacco Use  . Smoking status: Former Smoker    Packs/day: 0.25    Years: 20.00    Pack years: 5.00    Types: Pipe    Quit date: 01/02/1974    Years since quitting: 46.6  . Smokeless tobacco: Never Used  Substance Use Topics  . Alcohol use: No        Family History :     Family History  Problem Relation Age of Onset  . Heart disease Other   . Arthritis Other   . Cancer Other   . Anesthesia problems Neg Hx   . Hypotension Neg Hx   . Malignant hyperthermia Neg Hx   . Pseudochol deficiency Neg Hx      Home Medications:   Prior to Admission medications   Medication Sig Start Date End Date Taking? Authorizing Provider  acetaminophen (TYLENOL) 500 MG tablet Take 500 mg by mouth every 6 (six) hours as needed. For pain    Yes [provider]   allopurinol (ZYLOPRIM) 300 MG tablet Take 300 mg by mouth daily.     Yes [provider]  apixaban (ELIQUIS) 5 MG TABS tablet Take 5 mg by mouth 2 (two) times daily. 07/21/20  Yes [provider]  aspirin EC 81 MG tablet Take 81 mg by mouth daily. Swallow whole.   Yes [provider]  atorvastatin (LIPITOR) 20 MG tablet Take 20 mg by mouth every evening. 06/21/20  Yes [provider]  Janne Lab [facility]) OINT Special Instructions: Apply to sacrum/coccyx and bilateral buttocks qshift prn erythema. 09/27/19  Yes [provider]  cetirizine (ZYRTEC) 10 MG tablet Take 10 mg by mouth at bedtime. 06/25/20  Yes [provider]  donepezil (ARICEPT) 10 MG tablet Take 10 mg by mouth at bedtime.  02/19/14  Yes [provider]  gabapentin (NEURONTIN) 100 MG capsule Take 100 mg by mouth at bedtime.   Yes [provider]  ipratropium-albuterol (DUONEB) 0.5-2.5 (3) MG/3ML SOLN Take 3 mLs by nebulization every 6 (six) hours as needed. 07/15/20  Yes [provider]  metoprolol succinate (TOPROL-XL) 25 MG 24 hr tablet Take 3 tablets (75 mg total) by mouth daily. 08/02/20  Yes Johnson, Clanford L, MD  midodrine (PROAMATINE) 5 MG tablet Take 1 tablet (5 mg total) by mouth 3 (three) times daily with meals. 08/01/20  Yes Johnson, Clanford L, MD  pantoprazole (PROTONIX) 40 MG tablet Take 1 tablet (40 mg total) by mouth daily. 06/22/19  Yes Sinda Du, MD  potassium chloride SA (KLOR-CON) 20 MEQ tablet Take 20 mEq by mouth daily. 07/23/20  Yes [provider]  sodium chloride 0.45 % Inject 75 mLs into the vein See admin instructions. Parenteral solution; 75cc per hour, every shift for acute renal failure   Yes [provider]  torsemide (DEMADEX) 20 MG tablet Take 20 mg by mouth 2 (two) times daily. For Edema 08/12/20  Yes [provider]  Amino Acids-Protein Hydrolys (FEEDING SUPPLEMENT, PRO-STAT SUGAR FREE  64,) LIQD Take 30 mLs by mouth in the morning and at bedtime. 08/20/20   [provider]  NON  FORMULARY C-PAP from home, use home settings while sleeping At Bedtime 08:00 PM 01/02/20   [provider]  NON FORMULARY D1 puree and Nectar Thick diet continue NAS as ordered by physician. 08/02/20   [provider]  OXYGEN Inhale 2 L into the lungs continuous. for shortness of breath to keep 02 sat above 90% 08/01/20   [provider]  Vitamins A & D (VITAMIN A & D) ointment Apply 1 application topically 3 (three) times daily as needed for dry skin (Apply A&D ointment to left heel every shift & as needed for erythema.).  06/30/20   [provider]     Allergies:     Allergies  Allergen Reactions  . Other   . Penicillin G   . Sulfonic Acid (3,5-Dibromo-4-H-Ox-Benz)   . Penicillins Rash  . Sulfonamide Derivatives Rash  . Tetanus Toxoid Rash     Physical Exam:   Vitals  Blood pressure (!) 120/97, pulse (!) 51, temperature 97.9 F (36.6 C), temperature source Oral, resp. rate (!) 24, height 5\' 9"  (1.753 m), weight 91 kg, SpO2 94 %.   1. General frail elderly male, laying in bed, no apparent distress  2.  Patient is pleasant, he is answering yes and no questions intermittently, follows simple commands, he is oriented x2 .  3. No F.N deficits, ALL C.Nerves Intact, neuro grossly intact.  4. Ears and Eyes appear Normal, Conjunctivae clear, PERRLA. Moist Oral Mucosa.  5. Supple Neck, No JVD, No cervical lymphadenopathy appriciated, No Carotid Bruits.  6. Symmetrical Chest wall movement, diminished air entry at the lung bases, bilateral, but no wheezing or rhonchi  7.  Irregular irregular, tachycardic, No Gallops, Rubs or Murmurs, No Parasternal Heave.  8. Positive Bowel Sounds, Abdomen Soft, No tenderness, No organomegaly appriciated,No rebound -guarding or rigidity.  9.  No Cyanosis, Normal Skin Turgor, No Skin Rash or Bruise.  10. Good muscle  tone,  joints appear normal , no effusions, Normal ROM.  11. No Palpable Lymph Nodes in Neck or Axillae     Data Review:    CBC Recent Labs  Lab 08/26/20 1353  WBC 7.4  HGB 14.4  HCT 46.5  PLT 225  MCV 92.3  MCH 28.6  MCHC 31.0  RDW 17.5*  LYMPHSABS 1.4  MONOABS 0.7  EOSABS 0.4  BASOSABS 0.1   ------------------------------------------------------------------------------------------------------------------  Chemistries  Recent Labs  Lab 08/20/20 0700 08/21/20 1034 08/24/20 0800 08/26/20 1353  NA 150* 147* 144 144  K 4.2 3.8 3.6 3.8  CL 105 105 105 100  CO2 30 31 28 30   GLUCOSE 113* 103* 108* 94  BUN 81* 66* 43* 33*  CREATININE 1.46* 1.19 0.98 0.91  CALCIUM 9.6 8.8* 9.2 9.5  AST  --   --   --  21  ALT  --   --   --  16  ALKPHOS  --   --   --  110  BILITOT  --   --   --  1.4*   ------------------------------------------------------------------------------------------------------------------ estimated creatinine clearance is 67.4 mL/min (by C-G formula based on SCr of 0.91 mg/dL). ------------------------------------------------------------------------------------------------------------------ No results for input(s): TSH, T4TOTAL, T3FREE, THYROIDAB in the last 72 hours.  Invalid input(s): FREET3  Coagulation profile No results for input(s): INR, PROTIME in the last 168 hours. ------------------------------------------------------------------------------------------------------------------- No results for input(s): DDIMER in the last 72 hours. -------------------------------------------------------------------------------------------------------------------  Cardiac Enzymes No results for input(s): CKMB, TROPONINI, MYOGLOBIN in the last 168 hours.  Invalid input(s): CK ------------------------------------------------------------------------------------------------------------------  Component Value Date/Time   BNP 432.0 (H) 08/13/2020 0858      ---------------------------------------------------------------------------------------------------------------  Urinalysis    Component Value Date/Time   COLORURINE YELLOW 07/27/2020 1057   APPEARANCEUR CLEAR 07/27/2020 1057   LABSPEC 1.012 07/27/2020 1057   PHURINE 7.0 07/27/2020 1057   GLUCOSEU NEGATIVE 07/27/2020 1057   HGBUR SMALL (A) 07/27/2020 1057   BILIRUBINUR NEGATIVE 07/27/2020 1057   KETONESUR NEGATIVE 07/27/2020 1057   PROTEINUR NEGATIVE 07/27/2020 1057   NITRITE NEGATIVE 07/27/2020 1057   LEUKOCYTESUR NEGATIVE 07/27/2020 1057    ----------------------------------------------------------------------------------------------------------------   Imaging Results:    DG Chest Port 1 View  Result Date: 08/26/2020 CLINICAL DATA:  Shortness of breath EXAM: PORTABLE CHEST 1 VIEW COMPARISON:  Six days ago FINDINGS: Moderate right pleural effusion with obscured or opacified right lower lobe. Increased retrocardiac density which is also unchanged. There is likely residual trace left pleural fluid when compared with prior and when allowing for obscuration by EKG lead. No pneumothorax. Cardiopericardial enlargement. Unchanged appearance of ACDF hardware. IMPRESSION: 1. Stable compared to 6 days ago. 2. Moderate right pleural effusion. Electronically Signed   By: Monte Fantasia M.D.   On: 08/26/2020 11:58    My personal review of EKG: Rhythm A FIB with RVR, Rate  140 /min, QTc 449 , no Acute ST changes   Assessment & Plan:    Active Problems:   Essential hypertension   GERD   Chronic gout without tophus   Pleural effusion   COPD with hypoxia (HCC)   Chronic diastolic (congestive) heart failure (HCC)  A. fib with RVR -Uncontrolled with 140 on presentation, he did receive some gentle hydration, heart rate currently in the 120s, I will give him his home dose metoprolol now, as well will give 10 mg of IV Cardizem, to see if it improves, if not then he will need a Cardizem  drip/ -Eliquis for anticoagulation  Recurrent right-sided pleural effusion -Let us post thoracentesis 9/28, had 1300 cc removed per radiology, presents with some dyspnea, and recurrence of pleural effusion, will request another paracentesis. -We will check protein/LDH in both serum and fusion to determine transudate or exudate, but per recent pulmonary evaluation it was felt to be either due to volume overload versus recent infectious process.  Chronic diastolic CHF -No evidence of volume overload in lower extremities, is currently unclear if his recurrent right-sided pleural effusion related to this.  Hypertension -Continue with home medication History of CAD with prior stent -Continue with statin, beta-blockers, and he is on Eliquis(no more aspirin)  History of COPD/pulmonary sarcoidosis -He is followed with pulmonary as an outpatient  Dementia -Continue with Aricept  History of gout -Continue with allopurinol  Dyslipidemia -Continue with statin  OSA with noncompliance on CPA -Order CPAP at bedtime  GERD -PPI  Cervical myelopathy -Wheelchair bound   DVT Prophylaxis : on Eliquis  AM Labs Ordered, also please review Full Orders  Family Communication: Admission, patients condition and plan of care including tests being ordered have been discussed with the patient (was unable to reach wife, left her voicemail) who indicate understanding and agree with the plan and Code Status.  Code Status DNR(as per previous admissions and SNF records)  Likely DC to  Back to SNF  Condition GUARDED    Consults called: none    Admission status: observation    Time spent in minutes : 60 minutes   Phillips Climes M.D on 08/26/2020 at 3:30 PM   [facility] - Office  [phone]

## 2020-08-26 NOTE — ED Provider Notes (Addendum)
[facility] EMERGENCY DEPARTMENT Provider Note   CSN: [id number] Arrival date & time: 08/26/20  1104     History Chief Complaint  Patient presents with  . Shortness of Breath    Eric Bowman is a 86 y.o. male.  Patient sent in from [place] home.  For low oxygen levels.  Patient was admitted September 27 through October 2 for pleural effusion on the right side that underwent thoracentesis with a liter drawn off.  And also for atrial fibrillation with RVR.  Which was controlled while he was in the hospital initially with IV diltiazem and then switched over to oral medications.  Patient does have a history of dementia.  And hyperlipidemia diabetes coronary artery disease.  And history of melanoma.        Past Medical History:  Diagnosis Date  . Cancer (HCC)    melanoma-left side  . Chronic pain    legs and feet  . Coronary artery disease   . Dementia ([place])   . Diabetes mellitus without complication ([place])   . Foot drop   . GERD (gastroesophageal reflux disease)   . Gout   . Hyperlipidemia   . Hypertension   . OCD (obsessive compulsive disorder)   . PONV (postoperative nausea and vomiting)   . Psychosis ([place])   . Sarcoidosis   . Sleep apnea    cpap    Patient Active Problem List   Diagnosis Date Noted  . Hypertensive heart disease with chronic diastolic congestive heart failure ([place]) 08/26/2020  . Acute renal failure ([place]) 08/25/2020  . Hypernatremia 08/25/2020  . Chronic diastolic (congestive) heart failure ([place]) 08/14/2020  . Dysphagia 08/05/2020  . Hypotension 08/04/2020  . COPD with hypoxia ([place]) 08/04/2020  . Bilateral lower extremity edema 08/04/2020  . S/P thoracentesis   . Atrial fibrillation with rapid ventricular response ([place]) 07/27/2020  . History of sarcoidosis 07/22/2020  . History of melanoma 07/22/2020  . HCAP (healthcare-associated pneumonia) 07/20/2020  . Pleural effusion 07/20/2020  . Elevated brain natriuretic peptide (BNP)  level 07/20/2020  . Cellulitis of abdominal wall 07/07/2020  . Chronic non-seasonal allergic rhinitis 06/26/2020  . Weight gain 06/11/2020  . Weight loss 12/17/2019  . Poliomyelitis osteopathy of lower leg, right ([place]) 11/01/2019  . COVID-19 10/15/2019  . Radiculopathy of cervical spine 09/09/2019  . Dyslipidemia 06/24/2019  . Vascular dementia without behavioral disturbance ([place]) 06/24/2019  . Chronic gout without tophus 06/24/2019  . Physical deconditioning 06/24/2019  . Multiple falls 06/20/2019  . Arthritis of knee, left 01/03/2013  . Degenerative disc disease, lumbar 01/03/2013  . Coronary artery disease 09/13/2011  . Obstructive sleep apnea on CPAP 09/10/2007  . Essential hypertension 05/02/2007  . GERD 05/02/2007    Past Surgical History:  Procedure Laterality Date  . BACK SURGERY     cervical neck  . CATARACT EXTRACTION W/PHACO  01/05/2012   Procedure: CATARACT EXTRACTION PHACO AND INTRAOCULAR LENS PLACEMENT (IOC);  Surgeon: [name] Branch, MD;  Location: [facility];  Service: Ophthalmology;  Laterality: Left;  CDE 18.47  . CATARACT EXTRACTION W/PHACO  02/02/2012   Procedure: CATARACT EXTRACTION PHACO AND INTRAOCULAR LENS PLACEMENT (IOC);  Surgeon: [name] Branch, MD;  Location: [facility];  Service: Ophthalmology;  Laterality: Right;  CDE:15.38  . CHOLECYSTECTOMY    . hemorhoidectomy    . MELANOMA EXCISION     left side-[name]  . TONSILLECTOMY         Family History  Problem Relation Age of Onset  . Heart disease Other   .  Arthritis Other   . Cancer Other   . Anesthesia problems Neg Hx   . Hypotension Neg Hx   . Malignant hyperthermia Neg Hx   . Pseudochol deficiency Neg Hx     Social History   Tobacco Use  . Smoking status: Former Smoker    Packs/day: 0.25    Years: 20.00    Pack years: 5.00    Types: Pipe    Quit date: 01/02/1974    Years since quitting: 46.6  . Smokeless tobacco: Never Used  Vaping Use  . Vaping Use: Never used  Substance Use Topics  . Alcohol  use: No  . Drug use: No    Home Medications Prior to Admission medications   Medication Sig Start Date End Date Taking? Authorizing Provider  acetaminophen (TYLENOL) 500 MG tablet Take 500 mg by mouth every 6 (six) hours as needed. For pain    Yes [provider]  allopurinol (ZYLOPRIM) 300 MG tablet Take 300 mg by mouth daily.     Yes [provider]  apixaban (ELIQUIS) 5 MG TABS tablet Take 5 mg by mouth 2 (two) times daily. 07/21/20  Yes [provider]  aspirin EC 81 MG tablet Take 81 mg by mouth daily. Swallow whole.   Yes [provider]  atorvastatin (LIPITOR) 20 MG tablet Take 20 mg by mouth every evening. 06/21/20  Yes [provider]  Janne Lab Oil [facility]) OINT Special Instructions: Apply to sacrum/coccyx and bilateral buttocks qshift prn erythema. 09/27/19  Yes [provider]  cetirizine (ZYRTEC) 10 MG tablet Take 10 mg by mouth at bedtime. 06/25/20  Yes [provider]  donepezil (ARICEPT) 10 MG tablet Take 10 mg by mouth at bedtime.  02/19/14  Yes [provider]  gabapentin (NEURONTIN) 100 MG capsule Take 100 mg by mouth at bedtime.   Yes [provider]  ipratropium-albuterol (DUONEB) 0.5-2.5 (3) MG/3ML SOLN Take 3 mLs by nebulization every 6 (six) hours as needed. 07/15/20  Yes [provider]  metoprolol succinate (TOPROL-XL) 25 MG 24 hr tablet Take 3 tablets (75 mg total) by mouth daily. 08/02/20  Yes Johnson, Clanford L, MD  midodrine (PROAMATINE) 5 MG tablet Take 1 tablet (5 mg total) by mouth 3 (three) times daily with meals. 08/01/20  Yes Johnson, Clanford L, MD  pantoprazole (PROTONIX) 40 MG tablet Take 1 tablet (40 mg total) by mouth daily. 06/22/19  Yes Sinda Du, MD  potassium chloride SA (KLOR-CON) 20 MEQ tablet Take 20 mEq by mouth daily. 07/23/20  Yes [provider]  sodium chloride 0.45 % Inject 75 mLs into the vein See admin instructions. Parenteral  solution; 75cc per hour, every shift for acute renal failure   Yes [provider]  torsemide (DEMADEX) 20 MG tablet Take 20 mg by mouth 2 (two) times daily. For Edema 08/12/20  Yes [provider]  Amino Acids-Protein Hydrolys (FEEDING SUPPLEMENT, PRO-STAT SUGAR FREE 64,) LIQD Take 30 mLs by mouth in the morning and at bedtime. 08/20/20   [provider]  NON FORMULARY C-PAP from home, use home settings while sleeping At Bedtime 08:00 PM 01/02/20   [provider]  NON FORMULARY D1 puree and Nectar Thick diet continue NAS as ordered by physician. 08/02/20   [provider]  OXYGEN Inhale 2 L into the lungs continuous. for shortness of breath to keep 02 sat above 90% 08/01/20   [provider]  Vitamins A & D (VITAMIN A & D) ointment Apply  1 application topically 3 (three) times daily as needed for dry skin (Apply A&D ointment to left heel every shift & as needed for erythema.).  06/30/20   [provider]    Allergies    Other; Penicillin g; Sulfonic acid (3,5-dibromo-4-h-ox-benz); Penicillins; Sulfonamide derivatives; and Tetanus toxoid  Review of Systems   Review of Systems  Unable to perform ROS: Dementia    Physical Exam Updated Vital Signs BP (!) 120/97   Pulse (!) 51   Temp 97.9 F (36.6 C) (Oral)   Resp (!) 24   Ht 1.753 m (5\' 9" )   Wt 91 kg   SpO2 94%   BMI 29.63 kg/m   Physical Exam Vitals and nursing note reviewed.  Constitutional:      Appearance: Normal appearance. He is well-developed.  HENT:     Head: Normocephalic and atraumatic.  Eyes:     Extraocular Movements: Extraocular movements intact.     Conjunctiva/sclera: Conjunctivae normal.     Pupils: Pupils are equal, round, and reactive to light.  Cardiovascular:     Rate and Rhythm: Tachycardia present. Rhythm irregular.     Heart sounds: No murmur heard.   Pulmonary:     Effort: Pulmonary effort is normal. No respiratory distress.     Breath  sounds: Normal breath sounds.  Abdominal:     Palpations: Abdomen is soft.     Tenderness: There is no abdominal tenderness.  Musculoskeletal:        General: No swelling.     Cervical back: Normal range of motion and neck supple.  Skin:    General: Skin is warm and dry.     Capillary Refill: Capillary refill takes less than 2 seconds.  Neurological:     General: No focal deficit present.     Mental Status: He is alert. Mental status is at baseline.     ED Results / Procedures / Treatments   Labs (all labs ordered are listed, but only abnormal results are displayed) Labs Reviewed  CBC WITH DIFFERENTIAL/PLATELET - Abnormal; Notable for the following components:      Result Value   RDW 17.5 (*)    All other components within normal limits  COMPREHENSIVE METABOLIC PANEL - Abnormal; Notable for the following components:   BUN 33 (*)    Total Protein 6.3 (*)    Albumin 3.3 (*)    Total Bilirubin 1.4 (*)    All other components within normal limits  RESPIRATORY PANEL BY RT PCR (FLU A&B, COVID)    EKG None  ED ECG REPORT   Date: 08/26/2020  Rate: 140  Rhythm: atrial fibrillation  QRS Axis: normal  Intervals: normal  ST/T Wave abnormalities: ST depressions diffusely  Conduction Disutrbances:none  Narrative Interpretation:   Old EKG Reviewed: unchanged The ST segment depression probably rate related.  Also with some artifact. I have personally reviewed the EKG tracing and agree with the computerized printout as noted.   Radiology DG Chest Port 1 View  Result Date: 08/26/2020 CLINICAL DATA:  Shortness of breath EXAM: PORTABLE CHEST 1 VIEW COMPARISON:  Six days ago FINDINGS: Moderate right pleural effusion with obscured or opacified right lower lobe. Increased retrocardiac density which is also unchanged. There is likely residual trace left pleural fluid when compared with prior and when allowing for obscuration by EKG lead. No pneumothorax. Cardiopericardial enlargement.  Unchanged appearance of ACDF hardware. IMPRESSION: 1. Stable compared to 6 days ago. 2. Moderate right pleural effusion. Electronically Signed   By:  Monte Fantasia M.D.   On: 08/26/2020 11:58    Procedures Procedures (including critical care time)  CRITICAL CARE Performed by: Fredia Sorrow Total critical care time: 45 minutes Critical care time was exclusive of separately billable procedures and treating other patients. Critical care was necessary to treat or prevent imminent or life-threatening deterioration. Critical care was time spent personally by me on the following activities: development of treatment plan with patient and/or surrogate as well as nursing, discussions with consultants, evaluation of patient's response to treatment, examination of patient, obtaining history from patient or surrogate, ordering and performing treatments and interventions, ordering and review of laboratory studies, ordering and review of radiographic studies, pulse oximetry and re-evaluation of patient's condition.   Medications Ordered in ED Medications  diltiazem (CARDIZEM) 125 mg in dextrose 5% 125 mL (1 mg/mL) infusion (has no administration in time range)  acetaminophen (TYLENOL) tablet 500 mg (has no administration in time range)  allopurinol (ZYLOPRIM) tablet 300 mg (has no administration in time range)  apixaban (ELIQUIS) tablet 5 mg (has no administration in time range)  metoprolol succinate (TOPROL-XL) 24 hr tablet 75 mg (has no administration in time range)  midodrine (PROAMATINE) tablet 5 mg (has no administration in time range)  torsemide (DEMADEX) tablet 20 mg (has no administration in time range)  diltiazem (CARDIZEM) injection 10 mg (has no administration in time range)    ED Course  I have reviewed the triage vital signs and the nursing notes.  Pertinent labs & imaging results that were available during my care of the patient were reviewed by me and considered in my medical decision  making (see chart for details).    MDM Rules/Calculators/A&P                          Patient with recurrent right-sided pleural effusion moderate in size.  And also with persistent atrial fibrillation sometimes with heart rate as high as 140.  Other times low 100s.  We will go ahead and start on diltiazem drip and just titrate.  Patient's oxygen sats very we did get anything is low is what they got at the nursing facility of 75.  But he has been as low as 86 here and then other times it is above 90.  We went had placed him on 2 L of oxygen.  Feel that probably the recheck current pleural effusion is resulting in some oxygen issues.  And also the atrial fibrillation may be contributing.  This is all very similar to his admission September 27 and discharged on October 2 he did have thoracentesis of the pleural effusion at that time and they drew off a liter of fluid.  Discussed with the hospitalist who will see the patient for admission.  Final Clinical Impression(s) / ED Diagnoses Final diagnoses:  Atrial fibrillation with RVR ([place])  Pleural effusion  Hypoxia    Rx / DC Orders ED Discharge Orders    None       Fredia Sorrow, MD 08/26/20 1536    Fredia Sorrow, MD 08/26/20 1538

## 2020-08-26 NOTE — ED Triage Notes (Addendum)
Pt transported to ED from [facility] due to difficulty breathing, increase respiration  Sats 74% with O2 at 3 L via [place] .  pt denies coughing . Sats 100% on RA upon arrival

## 2020-08-26 NOTE — ED Notes (Signed)
Dr Waldron Labs states ok to do thoracentesis tomorrow. Need to attempt to obtain consent from family member before procedure.

## 2020-08-26 NOTE — Progress Notes (Signed)
Patient declined CPAP usage tonight. No unit in room at this time.

## 2020-08-26 NOTE — ED Notes (Signed)
ED TO INPATIENT HANDOFF REPORT  ED Nurse Name and Phone #: [phone]  S Name/Age/Gender Eric Bowman 86 y.o. male Room/Bed: APA12/APA12  Code Status   Code Status: Prior  Home/SNF/Other Rehab Patient oriented to: self Is this baseline? Yes   Triage Complete: Triage complete  Chief Complaint Pleural effusion [J90]  Triage Note Pt transported to ED from [facility] due to difficulty breathing, increase respiration  Sats 74% with O2 at 3 L via [place] .  pt denies coughing . Sats 100% on RA upon arrival    Allergies Allergies  Allergen Reactions  . Other   . Penicillin G   . Sulfonic Acid (3,5-Dibromo-4-H-Ox-Benz)   . Penicillins Rash  . Sulfonamide Derivatives Rash  . Tetanus Toxoid Rash    Level of Care/Admitting Diagnosis ED Disposition    ED Disposition Condition [place] [facility] [[id number]]  Level of Care: Telemetry [5]  Covid Evaluation: Asymptomatic Screening Protocol (No Symptoms)  Diagnosis: Pleural effusion [413244]  Admitting Physician: Manfred Shirts  Attending Physician: Waldron Labs, DAWOOD S [[id number]]       B Medical/Surgery History Past Medical History:  Diagnosis Date  . Cancer (HCC)    melanoma-left side  . Chronic pain    legs and feet  . Coronary artery disease   . Dementia ([place])   . Diabetes mellitus without complication ([place])   . Foot drop   . GERD (gastroesophageal reflux disease)   . Gout   . Hyperlipidemia   . Hypertension   . OCD (obsessive compulsive disorder)   . PONV (postoperative nausea and vomiting)   . Psychosis ([place])   . Sarcoidosis   . Sleep apnea    cpap   Past Surgical History:  Procedure Laterality Date  . BACK SURGERY     cervical neck  . CATARACT EXTRACTION W/PHACO  01/05/2012   Procedure: CATARACT EXTRACTION PHACO AND INTRAOCULAR LENS PLACEMENT (IOC);  Surgeon: [name] Branch, MD;  Location: [facility];  Service: Ophthalmology;  Laterality: Left;  CDE 18.47  . CATARACT  EXTRACTION W/PHACO  02/02/2012   Procedure: CATARACT EXTRACTION PHACO AND INTRAOCULAR LENS PLACEMENT (IOC);  Surgeon: [name] Branch, MD;  Location: [facility];  Service: Ophthalmology;  Laterality: Right;  CDE:15.38  . CHOLECYSTECTOMY    . hemorhoidectomy    . MELANOMA EXCISION     left side-[name]  . TONSILLECTOMY       A IV Location/Drains/Wounds Patient Lines/Drains/Airways Status    Active Line/Drains/Airways    Name Placement date Placement time Site Days   Peripheral IV 08/26/20 Left Wrist 08/26/20  1119  Wrist  less than 1   External Urinary Catheter 07/29/20  2030  --  28          Intake/Output Last 24 hours No intake or output data in the 24 hours ending 08/26/20 1805  Labs/Imaging Results for orders placed or performed during the hospital encounter of 08/26/20 (from the past 48 hour(s))  CBC with Differential/Platelet     Status: Abnormal   Collection Time: 08/26/20  1:53 PM  Result Value Ref Range   WBC 7.4 4.0 - 10.5 K/uL   RBC 5.04 4.22 - 5.81 MIL/uL   Hemoglobin 14.4 13.0 - 17.0 g/dL   HCT 46.5 39 - 52 %   MCV 92.3 80.0 - 100.0 fL   MCH 28.6 26.0 - 34.0 pg   MCHC 31.0 30.0 - 36.0 g/dL   RDW 17.5 (H) 11.5 - 15.5 %  Platelets 225 150 - 400 K/uL   nRBC 0.0 0.0 - 0.2 %   Neutrophils Relative % 66 %   Neutro Abs 4.9 1.7 - 7.7 K/uL   Lymphocytes Relative 18 %   Lymphs Abs 1.4 0.7 - 4.0 K/uL   Monocytes Relative 9 %   Monocytes Absolute 0.7 0.1 - 1.0 K/uL   Eosinophils Relative 6 %   Eosinophils Absolute 0.4 0.0 - 0.5 K/uL   Basophils Relative 1 %   Basophils Absolute 0.1 0.0 - 0.1 K/uL   Immature Granulocytes 0 %   Abs Immature Granulocytes 0.02 0.00 - 0.07 K/uL    Comment: Performed at [facility], [address], [place], [place] [address]  Comprehensive metabolic panel     Status: Abnormal   Collection Time: 08/26/20  1:53 PM  Result Value Ref Range   Sodium 144 135 - 145 mmol/L   Potassium 3.8 3.5 - 5.1 mmol/L   Chloride 100 98 - 111 mmol/L   CO2 30 22  - 32 mmol/L   Glucose, Bld 94 70 - 99 mg/dL    Comment: Glucose reference range applies only to samples taken after fasting for at least 8 hours.   BUN 33 (H) 8 - 23 mg/dL   Creatinine, Ser 0.91 0.61 - 1.24 mg/dL   Calcium 9.5 8.9 - 10.3 mg/dL   Total Protein 6.3 (L) 6.5 - 8.1 g/dL   Albumin 3.3 (L) 3.5 - 5.0 g/dL   AST 21 15 - 41 U/L   ALT 16 0 - 44 U/L   Alkaline Phosphatase 110 38 - 126 U/L   Total Bilirubin 1.4 (H) 0.3 - 1.2 mg/dL   GFR, Estimated >60 >60 mL/min    Comment: (NOTE) Calculated using the CKD-EPI Creatinine Equation (2021)    Anion gap 14 5 - 15    Comment: Performed at [facility], [address], [place], [place] [address]  Respiratory Panel by RT PCR (Flu A&B, Covid) - Nasopharyngeal Swab     Status: None   Collection Time: 08/26/20  3:17 PM   Specimen: Nasopharyngeal Swab  Result Value Ref Range   SARS Coronavirus 2 by RT PCR NEGATIVE NEGATIVE    Comment: (NOTE) SARS-CoV-2 target nucleic acids are NOT DETECTED.  The SARS-CoV-2 RNA is generally detectable in upper respiratoy specimens during the acute phase of infection. The lowest concentration of SARS-CoV-2 viral copies this assay can detect is 131 copies/mL. A negative result does not preclude SARS-Cov-2 infection and should not be used as the sole basis for treatment or other patient management decisions. A negative result may occur with  improper specimen collection/handling, submission of specimen other than nasopharyngeal swab, presence of viral mutation(s) within the areas targeted by this assay, and inadequate number of viral copies (<131 copies/mL). A negative result must be combined with clinical observations, patient history, and epidemiological information. The expected result is Negative.  Fact Sheet for Patients:  PinkCheek.be  Fact Sheet for Healthcare Providers:  GravelBags.it  This test is no t yet approved or cleared by the  [place] FDA and  has been authorized for detection and/or diagnosis of SARS-CoV-2 by FDA under an Emergency Use Authorization (EUA). This EUA will remain  in effect (meaning this test can be used) for the duration of the COVID-19 declaration under Section 564(b)(1) of the Act, 21 U.S.C. section 360bbb-3(b)(1), unless the authorization is terminated or revoked sooner.     Influenza A by PCR NEGATIVE NEGATIVE   Influenza B by PCR  NEGATIVE NEGATIVE    Comment: (NOTE) The Xpert Xpress SARS-CoV-2/FLU/RSV assay is intended as an aid in  the diagnosis of influenza from Nasopharyngeal swab specimens and  should not be used as a sole basis for treatment. Nasal washings and  aspirates are unacceptable for Xpert Xpress SARS-CoV-2/FLU/RSV  testing.  Fact Sheet for Patients: PinkCheek.be  Fact Sheet for Healthcare Providers: GravelBags.it  This test is not yet approved or cleared by the [place] FDA and  has been authorized for detection and/or diagnosis of SARS-CoV-2 by  FDA under an Emergency Use Authorization (EUA). This EUA will remain  in effect (meaning this test can be used) for the duration of the  Covid-19 declaration under Section 564(b)(1) of the Act, 21  U.S.C. section 360bbb-3(b)(1), unless the authorization is  terminated or revoked. Performed at [facility], [address], [place], [place] [address]    DG Chest Port 1 View  Result Date: 08/26/2020 CLINICAL DATA:  Shortness of breath EXAM: PORTABLE CHEST 1 VIEW COMPARISON:  Six days ago FINDINGS: Moderate right pleural effusion with obscured or opacified right lower lobe. Increased retrocardiac density which is also unchanged. There is likely residual trace left pleural fluid when compared with prior and when allowing for obscuration by EKG lead. No pneumothorax. Cardiopericardial enlargement. Unchanged appearance of ACDF hardware. IMPRESSION: 1. Stable  compared to 6 days ago. 2. Moderate right pleural effusion. Electronically Signed   By: Monte Fantasia M.D.   On: 08/26/2020 11:58    Pending Labs Unresulted Labs (From admission, onward)          Start     Ordered   08/27/20 0500  Lactate dehydrogenase  Tomorrow morning,   R        08/26/20 1530   Signed and Held  CBC  Tomorrow morning,   R        Signed and Held   Signed and Held  Comprehensive metabolic panel  Tomorrow morning,   R        Signed and Held          Vitals/Pain Today's Vitals   08/26/20 1700 08/26/20 1715 08/26/20 1730 08/26/20 1800  BP: 109/76  105/85 106/87  Pulse: 87  (!) 53   Resp:   (!) 28 20  Temp:      TempSrc:      SpO2: 92% 97% 99%   Weight:      Height:      PainSc:        Isolation Precautions No active isolations  Medications Medications  acetaminophen (TYLENOL) tablet 500 mg (has no administration in time range)  allopurinol (ZYLOPRIM) tablet 300 mg (has no administration in time range)  apixaban (ELIQUIS) tablet 5 mg (has no administration in time range)  metoprolol succinate (TOPROL-XL) 24 hr tablet 75 mg (75 mg Oral Given 08/26/20 1559)  midodrine (PROAMATINE) tablet 5 mg (0 mg Oral Hold 08/26/20 1737)  torsemide (DEMADEX) tablet 20 mg (has no administration in time range)  diltiazem (CARDIZEM) injection 10 mg (10 mg Intravenous Given 08/26/20 1557)    Mobility non-ambulatory High fall risk   Focused Assessments    R Recommendations: See Admitting Provider Note  Report given to:   Additional Notes:

## 2020-08-27 ENCOUNTER — Encounter (HOSPITAL_COMMUNITY): Payer: Self-pay | Admitting: Internal Medicine

## 2020-08-27 ENCOUNTER — Inpatient Hospital Stay
Admission: RE | Admit: 2020-08-27 | Discharge: 2020-10-02 | Disposition: A | Discharge status: Nursing Home (Includes ICF) | Payer: Medicare Other | Source: Ambulatory Visit | Attending: Internal Medicine | Admitting: Internal Medicine

## 2020-08-27 ENCOUNTER — Observation Stay (HOSPITAL_COMMUNITY): Payer: Medicare Other

## 2020-08-27 DIAGNOSIS — J449 Chronic obstructive pulmonary disease, unspecified: Secondary | ICD-10-CM | POA: Diagnosis not present

## 2020-08-27 DIAGNOSIS — J918 Pleural effusion in other conditions classified elsewhere: Principal | ICD-10-CM

## 2020-08-27 DIAGNOSIS — I10 Essential (primary) hypertension: Secondary | ICD-10-CM

## 2020-08-27 DIAGNOSIS — I5032 Chronic diastolic (congestive) heart failure: Secondary | ICD-10-CM | POA: Diagnosis not present

## 2020-08-27 DIAGNOSIS — J90 Pleural effusion, not elsewhere classified: Secondary | ICD-10-CM | POA: Diagnosis not present

## 2020-08-27 LAB — CBC
HCT: 45.7 % (ref 39.0–52.0)
Hemoglobin: 14.1 g/dL (ref 13.0–17.0)
MCH: 28.6 pg (ref 26.0–34.0)
MCHC: 30.9 g/dL (ref 30.0–36.0)
MCV: 92.7 fL (ref 80.0–100.0)
Platelets: 212 10*3/uL (ref 150–400)
RBC: 4.93 MIL/uL (ref 4.22–5.81)
RDW: 17.4 % — ABNORMAL HIGH (ref 11.5–15.5)
WBC: 6.5 10*3/uL (ref 4.0–10.5)
nRBC: 0 % (ref 0.0–0.2)

## 2020-08-27 LAB — COMPREHENSIVE METABOLIC PANEL
ALT: 15 U/L (ref 0–44)
AST: 19 U/L (ref 15–41)
Albumin: 3.1 g/dL — ABNORMAL LOW (ref 3.5–5.0)
Alkaline Phosphatase: 103 U/L (ref 38–126)
Anion gap: 10 (ref 5–15)
BUN: 26 mg/dL — ABNORMAL HIGH (ref 8–23)
CO2: 30 mmol/L (ref 22–32)
Calcium: 8.9 mg/dL (ref 8.9–10.3)
Chloride: 102 mmol/L (ref 98–111)
Creatinine, Ser: 0.74 mg/dL (ref 0.61–1.24)
GFR, Estimated: >60 mL/min (ref >60)
Glucose, Bld: 89 mg/dL (ref 70–99)
Potassium: 3.4 mmol/L — ABNORMAL LOW (ref 3.5–5.1)
Sodium: 142 mmol/L (ref 135–145)
Total Bilirubin: 1.6 mg/dL — ABNORMAL HIGH (ref 0.3–1.2)
Total Protein: 5.8 g/dL — ABNORMAL LOW (ref 6.5–8.1)

## 2020-08-27 LAB — GRAM STAIN

## 2020-08-27 LAB — BODY FLUID CELL COUNT WITH DIFFERENTIAL
Lymphs, Fluid: 72 %
Monocyte-Macrophage-Serous Fluid: 14 % — ABNORMAL LOW (ref 50–90)
Neutrophil Count, Fluid: 14 % (ref 0–25)
Other Cells, Fluid: REACTIVE %
Total Nucleated Cell Count, Fluid: 1101 cu mm — ABNORMAL HIGH (ref 0–1000)

## 2020-08-27 LAB — LACTATE DEHYDROGENASE, PLEURAL OR PERITONEAL FLUID: LD, Fluid: 65 U/L — ABNORMAL HIGH (ref 3–23)

## 2020-08-27 LAB — LACTATE DEHYDROGENASE: LDH: 131 U/L (ref 98–192)

## 2020-08-27 LAB — MRSA PCR SCREENING: MRSA by PCR: POSITIVE — AB

## 2020-08-27 LAB — PROTEIN, BODY FLUID (OTHER): Total Protein, Body Fluid Other: 3.1

## 2020-08-27 MED ORDER — MIDODRINE HCL 10 MG PO TABS: 10.0000 mg | ORAL_TABLET | Freq: Three times a day (TID) | ORAL | 3 refills | Status: AC

## 2020-08-27 MED ORDER — DILTIAZEM HCL 30 MG PO TABS
30.0000 mg | ORAL_TABLET | Freq: Once | ORAL | Status: AC
  Administered 2020-08-27: 30 mg via ORAL
  Filled 2020-08-27: qty 1

## 2020-08-27 MED ORDER — POTASSIUM CHLORIDE CRYS ER 20 MEQ PO TBCR
40.0000 meq | EXTENDED_RELEASE_TABLET | Freq: Once | ORAL | Status: AC
  Administered 2020-08-27: 40 meq via ORAL
  Filled 2020-08-27: qty 2

## 2020-08-27 MED ORDER — DILTIAZEM HCL ER COATED BEADS 120 MG PO CP24: 120.0000 mg | ORAL_CAPSULE | Freq: Every day | ORAL | 11 refills | Status: AC

## 2020-08-27 MED ORDER — ASPIRIN EC 81 MG PO TBEC
81.0000 mg | DELAYED_RELEASE_TABLET | Freq: Every day | ORAL | 11 refills | Status: DC
Start: 2020-08-27 — End: 2020-09-18

## 2020-08-27 MED ORDER — METOPROLOL SUCCINATE ER 50 MG PO TB24: 50.0000 mg | ORAL_TABLET | Freq: Every day | ORAL | 3 refills | Status: AC

## 2020-08-27 NOTE — Discharge Summary (Addendum)
Eric Bowman, is a 86 y.o. male  DOB 04-07-1933  MRN [id number].  Admission date:  08/26/2020  Admitting Physician  Eric Patricia, MD  Discharge Date:  08/27/2020   Primary MD  Eric Fee, NP  Recommendations for primary care physician for things to follow:    1)Follow - up with Dr. Kara Bowman or Dr. Melvyn Bowman Pulmonologist in [place]-  -Address: [address], [place], [place] [address] Phone: [phone] OR call [phone] -Patient needs to be seen in the [place] office, Not in [place] will review findings of pleura fluid analysis during your office visit  2)Repeat chest x-Eric Bowman on Tuesday, 09/01/2020  Admission Diagnosis  Dyspnea [R06.00] Pleural effusion [J90] Hypoxia [R09.02] Atrial fibrillation with RVR ([place]) [I48.91]   Discharge Diagnosis  Dyspnea [R06.00] Pleural effusion [J90] Hypoxia [R09.02] Atrial fibrillation with RVR ([place]) [I48.91]    Active Problems:   Essential hypertension   GERD   Chronic gout without tophus   Pleural effusion   COPD with hypoxia (HCC)   Chronic diastolic (congestive) heart failure ([place])      Past Medical History:  Diagnosis Date  . Cancer (HCC)    melanoma-left side  . Chronic pain    legs and feet  . Coronary artery disease   . Dementia ([place])   . Diabetes mellitus without complication ([place])   . Foot drop   . GERD (gastroesophageal reflux disease)   . Gout   . Hyperlipidemia   . Hypertension   . OCD (obsessive compulsive disorder)   . PONV (postoperative nausea and vomiting)   . Psychosis ([place])   . Sarcoidosis   . Sleep apnea    cpap    Past Surgical History:  Procedure Laterality Date  . BACK SURGERY     cervical neck  . CATARACT EXTRACTION W/PHACO  01/05/2012   Procedure: CATARACT EXTRACTION PHACO AND INTRAOCULAR LENS PLACEMENT (IOC);  Surgeon: [name] Branch, MD;  Location: [facility];  Service: Ophthalmology;   Laterality: Left;  CDE 18.47  . CATARACT EXTRACTION W/PHACO  02/02/2012   Procedure: CATARACT EXTRACTION PHACO AND INTRAOCULAR LENS PLACEMENT (IOC);  Surgeon: [name] Branch, MD;  Location: [facility];  Service: Ophthalmology;  Laterality: Right;  CDE:15.38  . CHOLECYSTECTOMY    . hemorhoidectomy    . MELANOMA EXCISION     left side-[name]  . TONSILLECTOMY       HPI  from the history and physical done on the day of admission:     Eric Bowman  is a 86 y.o. male, with medical history significant fordementia, cervical myelopathy with inability to ambulate, CAD, OSA, gout, hyperlipidemia, hypertension, and GERD with recent hospitalization diagnosed with A. fib, started on metoprolol and Eliquis, as well work-up significant for moderate subpleural effusion status post thoracentesis(likely transudate but no LDH was done), patient was sent from [facility] for shortness of breath and low oxygen levels, patient was noted to have some hypoxia at the facility, and some increased work of breathing, which prompted him to bring him to ED, per  ED physician he was saturating 86% where he is requiring 2 L nasal cannula at this point, as well he was noted to be in A. fib with RVR heart rate in the 140s, patient he is demented and very poor historian, but overall he is nodding yes to complaining of shortness of breath, during recent hospitalization he had thoracentesis done, no malignant cells could be identified, he did follow with pulmonary as an outpatient for effusion. - in ED patient chest x-Matthan significant for moderate pleural effusion, he was in A. fib with RVR heart rate in the 140s, then 1.09, normal white blood cell count at 7.4, his RVR and pleural effusion [facility] hospitalist consulted to admit.    Hospital Course:      1)Persistent atrial fibrillation -- -2D echocardiogram 07/27/2020 with LVEF 60 to 65% -  TSH is 2.324 -Continue anticoagulation with Eliquis  -Continue metoprolol for rate control by  reducing dose to 50 mg daily and add Cardizem CD 120 mg daily for rate control -Patient's history of persistent hypotension requiring midodrine makes it difficult to achieve good rate control with metoprolol and Cardizem due to soft BP   2)Recurrent right-sided pleural effusion Suspect transudate -Patient had right-sided thoracentesis on 07/28/2020 with removal of 1.3 L of pleural fluid -Repeat thoracentesis on 08/27/2020 with removal of 580 mL of pleural fluid -Fluid analysis pending -Patient will follow up with pulmonologist as outpatient next week for further evaluation and to discuss results of pleural fluid analysis as well -May need Pleurx catheter if thoracentesis becomes more recurrent requiring more frequent tapping  3)History of hypertension currently with hypotension -Increase midodrine to 10 mg 3 times daily - 4)History of CAD with prior stent- -Continue aspirin and statin -No chest pain or signs of ACS noted -Continue metoprolol  4)History of COPD/pulmonary sarcoid -Stable, continue bronchodilators outpatient follow-up with pulmonologist advised  5)History of diabetes --recent A1c 5.5 -Adequate oral intake in order to avoid hypoglycemia advised  6)History of dementia--with cognitive and memory deficits -Continue Aricept  7)History of gout -Continue allopurinol -No acute issues currently  8)OSA with noncompliance on CPAP  9)Cervical myelopathy -Wheelchair-bound   Code Status: DNR-Gold sheet at bedside Family Communication: --Discussed with wife at bedside Discharge Condition: Stable  Follow UP--pulmonologist as outpatient -Follow - up with Dr. Kara Bowman or Dr. Melvyn Bowman Pulmonologist in [place]-  -Address: [address] [place], [place] [address] Phone: [phone] OR call [phone] -Patient needs to be seen in the [place] office, Not in [place] will review findings of pleura fluid analysis during your office visit   Follow-up Information    Eric Rockers, MD. Schedule an appointment as soon as possible for a visit in 4 day(s).   Specialty: Pulmonary Disease Why: Follow - up with Dr. Kara Bowman or Dr. Melvyn Bowman Pulmonologist in [place]-  -Address: [address], [place], [place] [address] Phone: [phone] OR call [phone] -Patient needs to be seen in the [place] office, Not in [facility] information: [place] [address] [phone]                Consults obtained - na  Diet and Activity recommendation:  As advised  Discharge Instructions    Discharge Instructions    Bed rest   Complete by: As directed    Call MD for:  difficulty breathing, headache or visual disturbances   Complete by: As directed    Call MD for:  persistant dizziness or light-headedness   Complete  by: As directed    Call MD for:  persistant nausea and vomiting   Complete by: As directed    Call MD for:  temperature >100.4   Complete by: As directed    Diet - low sodium heart healthy   Complete by: As directed    Discharge instructions   Complete by: As directed    1)Follow - up with Dr. Kara Bowman or Dr. [place]vyn [place] [address], [place], [place] [address] Phone: [phone] OR call [phone] -Patient needs to be seen in the [place] office, Not in [place] will review findings of pleura fluid analysis during your office visit  2) repeat chest x-Lon on Tuesday, 09/01/2020       Discharge Medications     Allergies as of 08/27/2020      Reactions   Other    Penicillin G    Sulfonic Acid (3,5-dibromo-4-h-ox-benz)    Penicillins Rash   Sulfonamide Derivatives Rash   Tetanus Toxoid Rash      Medication List    TAKE these medications   acetaminophen 500 MG tablet Commonly known as: TYLENOL Take 500 mg by mouth every 6 (six) hours as needed. For pain   allopurinol 300 MG tablet Commonly known as:  ZYLOPRIM Take 300 mg by mouth daily.   aspirin EC 81 MG tablet Take 1 tablet (81 mg total) by mouth daily with breakfast. Swallow whole. What changed: when to take this   atorvastatin 20 MG tablet Commonly known as: LIPITOR Take 20 mg by mouth every evening.   cetirizine 10 MG tablet Commonly known as: ZYRTEC Take 10 mg by mouth at bedtime.   diltiazem 120 MG 24 hr capsule Commonly known as: Cardizem CD Take 1 capsule (120 mg total) by mouth daily.   donepezil 10 MG tablet Commonly known as: ARICEPT Take 10 mg by mouth at bedtime.   Eliquis 5 MG Tabs tablet Generic drug: apixaban Take 5 mg by mouth 2 (two) times daily.   feeding supplement (PRO-STAT SUGAR FREE 64) Liqd Take 30 mLs by mouth in the morning and at bedtime.   gabapentin 100 MG capsule Commonly known as: NEURONTIN Take 100 mg by mouth at bedtime.   ipratropium-albuterol 0.5-2.5 (3) MG/3ML Soln Commonly known as: DUONEB Take 3 mLs by nebulization every 6 (six) hours as needed.   metoprolol succinate 50 MG 24 hr tablet Commonly known as: TOPROL-XL Take 1 tablet (50 mg total) by mouth daily. What changed:   medication strength  how much to take   midodrine 10 MG tablet Commonly known as: PROAMATINE Take 1 tablet (10 mg total) by mouth 3 (three) times daily with meals. What changed:   medication strength  how much to take   NON FORMULARY C-PAP from home, use home settings while sleeping At Bedtime 08:00 PM   NON FORMULARY D1 puree and Nectar Thick diet continue NAS as ordered by physician.   OXYGEN Inhale 2 L into the lungs continuous. for shortness of breath to keep 02 sat above 90%   pantoprazole 40 MG tablet Commonly known as: PROTONIX Take 1 tablet (40 mg total) by mouth daily.   potassium chloride SA 20 MEQ tablet Commonly known as: KLOR-CON Take 20 mEq by mouth daily.   sodium chloride 0.45 % Inject 75 mLs into the vein See admin instructions. Parenteral solution; 75cc per hour,  every shift for acute renal failure   torsemide 20 MG tablet Commonly known as: DEMADEX Take 20  mg by mouth 2 (two) times daily. For Edema   Venelex Oint Special Instructions: Apply to sacrum/coccyx and bilateral buttocks qshift prn erythema.   vitamin A & D ointment Apply 1 application topically 3 (three) times daily as needed for dry skin (Apply A&D ointment to left heel every shift & as needed for erythema.).       Major procedures and Radiology Reports - PLEASE review detailed and final reports for all details, in brief -   DG Chest 1 View  Result Date: 08/27/2020 CLINICAL DATA:  RIGHT pleural effusion post thoracentesis EXAM: CHEST  1 VIEW COMPARISON:  08/26/2020 FINDINGS: Enlargement of cardiac silhouette. Mediastinal contours and pulmonary vascularity normal. Decreased RIGHT pleural effusion and basilar atelectasis post thoracentesis. No pneumothorax. Remaining lungs clear. Bones demineralized. IMPRESSION: No pneumothorax following RIGHT thoracentesis. Electronically Signed   By: Lavonia Dana M.D.   On: 08/27/2020 13:10   DG Chest 2 View  Result Date: 08/21/2020 CLINICAL DATA:  Follow-up pleural effusion EXAM: CHEST - 2 VIEW COMPARISON:  08/10/2020 FINDINGS: Cardiac shadow is stable. Left lung is well aerated without focal infiltrate. Small left pleural effusion is noted posteriorly. Large right-sided pleural effusion is noted increased when compared with the prior exam. Underlying infiltrate is likely present. No bony abnormality is noted. IMPRESSION: Bilateral pleural effusions right greater than left increased from the prior exam. Electronically Signed   By: Inez Catalina M.D.   On: 08/21/2020 20:42   DG Chest 2 View  Result Date: 08/11/2020 CLINICAL DATA:  Oral effusion EXAM: CHEST - 2 VIEW COMPARISON:  07/28/2020, 07/28/2019 FINDINGS: Stable cardiomediastinal contours. Atherosclerotic calcification of the aortic knob. Small to moderate-sized right-sided pleural effusion. No  focal airspace consolidation. No pneumothorax. Degenerative changes of the spine. IMPRESSION: Small to moderate-sized right-sided pleural effusion. Electronically Signed   By: Davina Poke D.O.   On: 08/11/2020 16:18   DG Chest Port 1 View  Result Date: 08/26/2020 CLINICAL DATA:  Shortness of breath EXAM: PORTABLE CHEST 1 VIEW COMPARISON:  Six days ago FINDINGS: Moderate right pleural effusion with obscured or opacified right lower lobe. Increased retrocardiac density which is also unchanged. There is likely residual trace left pleural fluid when compared with prior and when allowing for obscuration by EKG lead. No pneumothorax. Cardiopericardial enlargement. Unchanged appearance of ACDF hardware. IMPRESSION: 1. Stable compared to 6 days ago. 2. Moderate right pleural effusion. Electronically Signed   By: Monte Fantasia M.D.   On: 08/26/2020 11:58   DG Swallowing Func-Speech Pathology  Result Date: 07/31/2020 Objective Swallowing Evaluation: Type of Study: MBS-Modified Barium Swallow Study  Patient Details Name: BUFFORD HELMS MRN: [id number] Date of Birth: [birth year] Today's Date: 07/31/2020 Time: SLP Start Time (ACUTE ONLY): 1015 -SLP Stop Time (ACUTE ONLY): 1042 SLP Time Calculation (min) (ACUTE ONLY): 27 min Past Medical History: Past Medical History: Diagnosis Date . Cancer (HCC)   melanoma-left side . Chronic pain   legs and feet . Coronary artery disease  . Dementia ([place])  . Diabetes mellitus without complication ([place])  . Foot drop  . GERD (gastroesophageal reflux disease)  . Gout  . Hyperlipidemia  . Hypertension  . OCD (obsessive compulsive disorder)  . PONV (postoperative nausea and vomiting)  . Psychosis ([place])  . Reflux  . Sarcoidosis  . Sleep apnea   cpap Past Surgical History: Past Surgical History: Procedure Laterality Date . BACK SURGERY    cervical neck . CATARACT EXTRACTION W/PHACO  01/05/2012  Procedure: CATARACT EXTRACTION PHACO AND INTRAOCULAR LENS PLACEMENT (IOC);  Surgeon: [name] Branch,  MD;  Location: [facility];  Service: Ophthalmology;  Laterality: Left;  CDE 18.47 . CATARACT EXTRACTION W/PHACO  02/02/2012  Procedure: CATARACT EXTRACTION PHACO AND INTRAOCULAR LENS PLACEMENT (IOC);  Surgeon: [name] Branch, MD;  Location: [facility];  Service: Ophthalmology;  Laterality: Right;  CDE:15.38 . CHOLECYSTECTOMY   . GALLBLADDER SURGERY   . hemorhoidectomy   . MELANOMA EXCISION    left side-[name] . TONSILLECTOMY   HPI: EDGER HUSAIN is a 86 y.o. male with medical history significant for dementia, cervical myelopathy with inability to ambulate, CAD, OSA, gout, hyperlipidemia, hypertension, and GERD who was brought from [facility] with some hypoxemia as well as tachycardia and weakness.  His heart rate was approximately 140 bpm and he was noted to be in atrial fibrillation which was new for him.  He was also noted to be tachypneic and has not a known moderate-sized pleural effusion from ultrasound on 9/20, but thoracentesis could not be performed at that time since he could not sit upright.  He was suspected to have pneumonia a few days prior on 9/22 for which he was started on Levaquin intravenously for treatment. Right-sided pleural effusion status post thoracentesis on 9/28. BSE requested 07/29/20.  Subjective: Pt was very alert and responsive today Assessment / Plan / Recommendation CHL IP CLINICAL IMPRESSIONS 07/31/2020 Clinical Impression Pt presents with moderate oropharyngeal dysphagia characterized by consistent silent aspiration (one or two of multiple episodes were sensed with a reflexive cough) of thin liquids (with all presentations - tsp, cup and straw) as a result of poor laryngeal vestibule closure (LVC). Occasional premature spillage of thin liquids is immediately aspirated before the swallow. With all cup and straw sips of thin liquids Pt aspirated trace to moderate amount of thin liquid during the swallow. Further note, decreased LVC is specifically characterized by decreased arytenoid adduction,  decreased epiglottic deflection, and decreased pharyngeal constriction. Pt was congnitively unable to consistently complete chin tuck maneuver. NTL were consumed with visualized flash penetration and no aspiration. No penetration or aspiration was visualized with HTL trials however note increased pharyngeal residuals. Pt consumed puree textures with trace vallecuale residuals that were cleared with cued repeat swallow. It is possible and important to note that thickened liquids and/or puree texture residuals will mix with secretions and eventually be aspirated in small amounts.  Recommend continue with D1/puree diet and NECTAR THICK liquids with meds crushed in puree. ST will continue to follow acutely. Recommend ST f/u at SNF for diet tolerance and dysphagia therapy where Pt is a resident and recommend consider repeat MBSS when clinically appropriate.  SLP Visit Diagnosis Dysphagia, unspecified (R13.10) Attention and concentration deficit following -- Frontal lobe and executive function deficit following -- Impact on safety and function Mild aspiration risk;Moderate aspiration risk   CHL IP TREATMENT RECOMMENDATION 07/31/2020 Treatment Recommendations Therapy as outlined in treatment plan below   Prognosis 07/31/2020 Prognosis for Safe Diet Advancement Fair Barriers to Reach Goals Cognitive deficits Barriers/Prognosis Comment -- CHL IP DIET RECOMMENDATION 07/31/2020 SLP Diet Recommendations Dysphagia 1 (Puree) solids;Nectar thick liquid Liquid Administration via Cup Medication Administration Crushed with puree Compensations Minimize environmental distractions;Slow rate;Follow solids with liquid Postural Changes Remain semi-upright after after feeds/meals (Comment);Seated upright at 90 degrees   CHL IP OTHER RECOMMENDATIONS 07/31/2020 Recommended Consults -- Oral Care Recommendations Oral care BID Other Recommendations Order thickener from pharmacy   CHL IP FOLLOW UP RECOMMENDATIONS 07/31/2020 Follow up Recommendations  Skilled Nursing facility;24 hour supervision/assistance   CHL IP FREQUENCY AND DURATION  07/31/2020 Speech Therapy Frequency (ACUTE ONLY) min 1 x/week Treatment Duration 1 week      CHL IP ORAL PHASE 07/31/2020 Oral Phase Impaired Oral - Pudding Teaspoon -- Oral - Pudding Cup -- Oral - Honey Teaspoon -- Oral - Honey Cup -- Oral - Nectar Teaspoon -- Oral - Nectar Cup -- Oral - Nectar Straw -- Oral - Thin Teaspoon Premature spillage Oral - Thin Cup Premature spillage Oral - Thin Straw Premature spillage Oral - Puree -- Oral - Mech Soft -- Oral - Regular -- Oral - Multi-Consistency -- Oral - Pill -- Oral Phase - Comment --  CHL IP PHARYNGEAL PHASE 07/31/2020 Pharyngeal Phase Impaired Pharyngeal- Pudding Teaspoon -- Pharyngeal -- Pharyngeal- Pudding Cup -- Pharyngeal -- Pharyngeal- Honey Teaspoon -- Pharyngeal -- Pharyngeal- Honey Cup Pharyngeal residue - valleculae;Pharyngeal residue - pyriform;Reduced epiglottic inversion;Reduced pharyngeal peristalsis;Reduced laryngeal elevation;Reduced tongue base retraction;Reduced airway/laryngeal closure Pharyngeal -- Pharyngeal- Nectar Teaspoon -- Pharyngeal -- Pharyngeal- Nectar Cup Reduced pharyngeal peristalsis;Reduced epiglottic inversion;Reduced airway/laryngeal closure;Reduced tongue base retraction;Penetration/Aspiration during swallow;Pharyngeal residue - valleculae;Pharyngeal residue - pyriform Pharyngeal Material enters airway, remains ABOVE vocal cords then ejected out Pharyngeal- Nectar Straw -- Pharyngeal -- Pharyngeal- Thin Teaspoon Reduced pharyngeal peristalsis;Reduced epiglottic inversion;Reduced airway/laryngeal closure;Reduced tongue base retraction;Penetration/Aspiration during swallow;Penetration/Aspiration before swallow;Moderate aspiration;Trace aspiration;Pharyngeal residue - valleculae;Pharyngeal residue - pyriform Pharyngeal Material enters airway, passes BELOW cords without attempt by patient to eject out (silent aspiration);Material enters airway, passes  BELOW cords and not ejected out despite cough attempt by patient Pharyngeal- Thin Cup Reduced pharyngeal peristalsis;Reduced epiglottic inversion;Reduced airway/laryngeal closure;Reduced tongue base retraction;Penetration/Aspiration during swallow;Penetration/Aspiration before swallow;Moderate aspiration;Trace aspiration;Pharyngeal residue - valleculae;Pharyngeal residue - pyriform Pharyngeal Material enters airway, passes BELOW cords without attempt by patient to eject out (silent aspiration);Material enters airway, passes BELOW cords and not ejected out despite cough attempt by patient Pharyngeal- Thin Straw Reduced pharyngeal peristalsis;Reduced epiglottic inversion;Reduced airway/laryngeal closure;Reduced tongue base retraction;Penetration/Aspiration during swallow;Penetration/Aspiration before swallow;Moderate aspiration;Trace aspiration;Pharyngeal residue - valleculae;Pharyngeal residue - pyriform Pharyngeal Material enters airway, passes BELOW cords without attempt by patient to eject out (silent aspiration);Material enters airway, passes BELOW cords and not ejected out despite cough attempt by patient Pharyngeal- Puree Pharyngeal residue - valleculae;Pharyngeal residue - pyriform;Reduced epiglottic inversion Pharyngeal -- Pharyngeal- Mechanical Soft -- Pharyngeal -- Pharyngeal- Regular (No Data) Pharyngeal -- Pharyngeal- Multi-consistency -- Pharyngeal -- Pharyngeal- Pill -- Pharyngeal -- Pharyngeal Comment --  CHL IP CERVICAL ESOPHAGEAL PHASE 07/31/2020 Cervical Esophageal Phase WFL Pudding Teaspoon -- Pudding Cup -- Honey Teaspoon -- Honey Cup -- Nectar Teaspoon -- Nectar Cup -- Nectar Straw -- Thin Teaspoon -- Thin Cup -- Thin Straw -- Puree -- Mechanical Soft -- Regular -- Multi-consistency -- Pill -- Cervical Esophageal Comment -- Amelia H. Roddie Mc, CCC-SLP Speech Language Pathologist Wende Bushy 07/31/2020, 11:34 AM              US THORACENTESIS ASP PLEURAL SPACE W/IMG GUIDE  Result Date:  08/27/2020 INDICATION: RIGHT pleural effusion EXAM: ULTRASOUND GUIDED DIAGNOSTIC AND THERAPEUTIC RIGHT THORACENTESIS MEDICATIONS: None COMPLICATIONS: None immediate PROCEDURE: An ultrasound guided thoracentesis was thoroughly discussed with the patient and questions answered. The benefits, risks, alternatives and complications were also discussed. The patient understands and wishes to proceed with the procedure. Written consent was obtained. Ultrasound was performed to localize and mark an adequate pocket of fluid in the RIGHT chest. The area was then prepped and draped in the normal sterile fashion. 1% Lidocaine was used for local anesthesia. Under ultrasound guidance a 8 French thoracentesis catheter was introduced. Thoracentesis was performed.  The catheter was removed and a dressing applied. FINDINGS: A total of approximately 580 mL of slightly cloudy yellow RIGHT pleural fluid was removed. Samples were sent to the laboratory as requested by the clinical team. IMPRESSION: Successful ultrasound guided RIGHT thoracentesis yielding 580 mL of pleural fluid. Electronically Signed   By: Lavonia Dana M.D.   On: 08/27/2020 13:14    Micro Results    Recent Results (from the past 240 hour(s))  Respiratory Panel by RT PCR (Flu A&B, Covid) - Nasopharyngeal Swab     Status: None   Collection Time: 08/26/20  3:17 PM   Specimen: Nasopharyngeal Swab  Result Value Ref Range Status   SARS Coronavirus 2 by RT PCR NEGATIVE NEGATIVE Final    Comment: (NOTE) SARS-CoV-2 target nucleic acids are NOT DETECTED.  The SARS-CoV-2 RNA is generally detectable in upper respiratoy specimens during the acute phase of infection. The lowest concentration of SARS-CoV-2 viral copies this assay can detect is 131 copies/mL. A negative result does not preclude SARS-Cov-2 infection and should not be used as the sole basis for treatment or other patient management decisions. A negative result may occur with  improper specimen  collection/handling, submission of specimen other than nasopharyngeal swab, presence of viral mutation(s) within the areas targeted by this assay, and inadequate number of viral copies (<131 copies/mL). A negative result must be combined with clinical observations, patient history, and epidemiological information. The expected result is Negative.  Fact Sheet for Patients:  PinkCheek.be  Fact Sheet for Healthcare Providers:  GravelBags.it  This test is no t yet approved or cleared by the [place] FDA and  has been authorized for detection and/or diagnosis of SARS-CoV-2 by FDA under an Emergency Use Authorization (EUA). This EUA will remain  in effect (meaning this test can be used) for the duration of the COVID-19 declaration under Section 564(b)(1) of the Act, 21 U.S.C. section 360bbb-3(b)(1), unless the authorization is terminated or revoked sooner.     Influenza A by PCR NEGATIVE NEGATIVE Final   Influenza B by PCR NEGATIVE NEGATIVE Final    Comment: (NOTE) The Xpert Xpress SARS-CoV-2/FLU/RSV assay is intended as an aid in  the diagnosis of influenza from Nasopharyngeal swab specimens and  should not be used as a sole basis for treatment. Nasal washings and  aspirates are unacceptable for Xpert Xpress SARS-CoV-2/FLU/RSV  testing.  Fact Sheet for Patients: PinkCheek.be  Fact Sheet for Healthcare Providers: GravelBags.it  This test is not yet approved or cleared by the [place] FDA and  has been authorized for detection and/or diagnosis of SARS-CoV-2 by  FDA under an Emergency Use Authorization (EUA). This EUA will remain  in effect (meaning this test can be used) for the duration of the  Covid-19 declaration under Section 564(b)(1) of the Act, 21  U.S.C. section 360bbb-3(b)(1), unless the authorization is  terminated or revoked. Performed at [facility], [address] [place], [place] [address]   Gram stain     Status: None   Collection Time: 08/27/20 12:50 PM   Specimen: Pleura; Body Fluid  Result Value Ref Range Status   Specimen Description PLEURAL  Final   Special Requests NONE  Final   Gram Stain   Final    CYTOSPIN SMEAR NO ORGANISMS SEEN WBC PRESENT, PREDOMINANTLY MONONUCLEAR Performed at [facility], [address] [place], [place] [address]    Report Status 08/27/2020 FINAL  Final  Culture, body fluid-bottle     Status: None (Preliminary result)  Collection Time: 08/27/20 12:50 PM   Specimen: Pleura  Result Value Ref Range Status   Specimen Description PLEURAL  Final   Special Requests   Final    BOTTLES DRAWN AEROBIC AND ANAEROBIC 10cc Performed at [facility], [address] [place], [place] [address]    Culture PENDING  Incomplete   Report Status PENDING  Incomplete    Today   Subjective    Jeremih Mctavish today has no new complaints -Wife at bedside, resting concern       Patient has been seen and examined prior to discharge   Objective   Blood pressure 93/61, pulse 84, temperature (!) 97.5 F (36.4 C), temperature source Oral, resp. rate 18, height 5\' 9"  (1.753 m), weight 91 kg, SpO2 93 %.   Intake/Output Summary (Last 24 hours) at 08/27/2020 1556 Last data filed at 08/27/2020 1034 Gross per 24 hour  Intake --  Output 1200 ml  Net -1200 ml    Exam  Gen:- Awake Alert, no acute distress  HEENT:- [place].AT, No sclera icterus Nose- [place] 2L/min Neck-Supple Neck,No JVD,.  Lungs-diminished breath sounds on the right, no wheezing CV- S1, S2 normal,irregular Abd-  +ve B.Sounds, Abd Soft, No tenderness,    Extremity/Skin:- No  edema,   good pulses Psych-affect is flat, oriented x3 Neuro-generalized weakness, no new focal deficits, no tremors    Data Review   CBC w Diff:  Lab Results  Component Value Date   WBC 6.5 08/27/2020   HGB 14.1 08/27/2020   HCT 45.7 08/27/2020   PLT 212 08/27/2020    LYMPHOPCT 18 08/26/2020   MONOPCT 9 08/26/2020   EOSPCT 6 08/26/2020   BASOPCT 1 08/26/2020    CMP:  Lab Results  Component Value Date   NA 142 08/27/2020   K 3.4 (L) 08/27/2020   CL 102 08/27/2020   CO2 30 08/27/2020   BUN 26 (H) 08/27/2020   CREATININE 0.74 08/27/2020   PROT 5.8 (L) 08/27/2020   ALBUMIN 3.1 (L) 08/27/2020   BILITOT 1.6 (H) 08/27/2020   ALKPHOS 103 08/27/2020   AST 19 08/27/2020   ALT 15 08/27/2020  .   Total Discharge time is about 33 minutes  Roxan Hockey M.D on 08/27/2020 at 3:56 PM  Go to [email or web address] -  for contact info  [facility] - Office  [phone]

## 2020-08-27 NOTE — Plan of Care (Signed)
  Problem: Education: Goal: Knowledge of General Education information will improve Description: Including pain rating scale, medication(s)/side effects and non-pharmacologic comfort measures 08/27/2020 1600 by Cameron Ali, RN Outcome: Completed/Met 08/27/2020 1126 by Cameron Ali, RN Outcome: Progressing   Problem: Health Behavior/Discharge Planning: Goal: Ability to manage health-related needs will improve 08/27/2020 1600 by Cameron Ali, RN Outcome: Completed/Met 08/27/2020 1126 by Cameron Ali, RN Outcome: Progressing   Problem: Clinical Measurements: Goal: Ability to maintain clinical measurements within normal limits will improve 08/27/2020 1600 by Cameron Ali, RN Outcome: Completed/Met 08/27/2020 1126 by Cameron Ali, RN Outcome: Progressing Goal: Will remain free from infection 08/27/2020 1600 by Cameron Ali, RN Outcome: Completed/Met 08/27/2020 1126 by Cameron Ali, RN Outcome: Progressing Goal: Diagnostic test results will improve 08/27/2020 1600 by Cameron Ali, RN Outcome: Completed/Met 08/27/2020 1126 by Cameron Ali, RN Outcome: Progressing Goal: Respiratory complications will improve 08/27/2020 1600 by Cameron Ali, RN Outcome: Completed/Met 08/27/2020 1126 by Cameron Ali, RN Outcome: Progressing Goal: Cardiovascular complication will be avoided 08/27/2020 1600 by Cameron Ali, RN Outcome: Completed/Met 08/27/2020 1126 by Cameron Ali, RN Outcome: Progressing   Problem: Activity: Goal: Risk for activity intolerance will decrease 08/27/2020 1600 by Cameron Ali, RN Outcome: Completed/Met 08/27/2020 1126 by Cameron Ali, RN Outcome: Progressing   Problem: Nutrition: Goal: Adequate nutrition will be maintained 08/27/2020 1600 by Cameron Ali, RN Outcome: Completed/Met 08/27/2020 1126 by Cameron Ali, RN Outcome: Progressing   Problem: Coping: Goal: Level  of anxiety will decrease 08/27/2020 1600 by Cameron Ali, RN Outcome: Completed/Met 08/27/2020 1126 by Cameron Ali, RN Outcome: Progressing   Problem: Elimination: Goal: Will not experience complications related to bowel motility 08/27/2020 1600 by Cameron Ali, RN Outcome: Completed/Met 08/27/2020 1126 by Cameron Ali, RN Outcome: Progressing Goal: Will not experience complications related to urinary retention 08/27/2020 1600 by Cameron Ali, RN Outcome: Completed/Met 08/27/2020 1126 by Cameron Ali, RN Outcome: Progressing   Problem: Pain Managment: Goal: General experience of comfort will improve 08/27/2020 1600 by Cameron Ali, RN Outcome: Completed/Met 08/27/2020 1126 by Cameron Ali, RN Outcome: Progressing   Problem: Safety: Goal: Ability to remain free from injury will improve 08/27/2020 1600 by Cameron Ali, RN Outcome: Completed/Met 08/27/2020 1126 by Cameron Ali, RN Outcome: Progressing   Problem: Skin Integrity: Goal: Risk for impaired skin integrity will decrease 08/27/2020 1600 by Cameron Ali, RN Outcome: Completed/Met 08/27/2020 1126 by Cameron Ali, RN Outcome: Progressing

## 2020-08-27 NOTE — Procedures (Signed)
PreOperative Dx: Recurrent RIGHT pleural effusion Postoperative Dx: Recurrent RIGHT pleural effusion Procedure:   US guided RIGHT thoracentesis Radiologist:  Thornton Papas Anesthesia:  10 ml of 1% lidocaine Specimen:  580 mL of slightly cloudy yellow colored fluid EBL:   < 1 ml Complications: None

## 2020-08-27 NOTE — Plan of Care (Signed)

## 2020-08-27 NOTE — Discharge Instructions (Signed)
1)Follow - up with Dr. Kara Mead or Dr. Melvyn Novas Pulmonologist in [place]-  -Address: [address] [place], [place] [address] Phone: [phone] OR call [phone] -Patient needs to be seen in the [place] office, Not in [place] will review findings of pleura fluid analysis during your office visit  2) repeat chest x-[facility] on Tuesday, 09/01/2020

## 2020-08-27 NOTE — Sedation Documentation (Signed)
Patient tolerated Right sided thoracentesis procedure well today and 580 mL of cloudy yellow fluid removed and sent to lab for processing. Post xray completed and read by radiologist. PT returned to inpatient room with no acute distress noted and vital signs stable.

## 2020-08-27 NOTE — Progress Notes (Signed)
Attempted to call report at [facility] to give report. No answer at this time.

## 2020-08-27 NOTE — TOC Progression Note (Signed)
Pt has been here observation status from long term care at [facility]. Pt with orders for dc back to [facility]. Updated Tami at [facility] and they can take pt back today. DC clinical sent electronically. RN to call report. There are no other TOC needs for dc.

## 2020-08-27 NOTE — Progress Notes (Signed)
CRITICAL VALUE STICKER  CRITICAL VALUE: positive MRSA  RECEIVER (on-site recipient of call): [name], [place] NOTIFIED: [phone] 08/27/2020  MESSENGER (representative from lab):  Filbert Berthold  MD NOTIFIED: Dr. Joesph Fillers   TIME OF NOTIFICATION: 5732  RESPONSE: No (pt DC)

## 2020-08-27 NOTE — Progress Notes (Signed)
Discharge instructions given to nurse Benjie Karvonen [place] center. All belongings sent with patient. Wife at bedside. No new questions at this time.

## 2020-08-28 ENCOUNTER — Non-Acute Institutional Stay (SKILLED_NURSING_FACILITY): Payer: Medicare Other | Admitting: Adult Health

## 2020-08-28 ENCOUNTER — Telehealth: Payer: Self-pay | Admitting: Pulmonary Disease

## 2020-08-28 ENCOUNTER — Encounter: Payer: Self-pay | Admitting: Adult Health

## 2020-08-28 DIAGNOSIS — I5032 Chronic diastolic (congestive) heart failure: Secondary | ICD-10-CM

## 2020-08-28 DIAGNOSIS — Z862 Personal history of diseases of the blood and blood-forming organs and certain disorders involving the immune mechanism: Secondary | ICD-10-CM | POA: Diagnosis not present

## 2020-08-28 DIAGNOSIS — R0902 Hypoxemia: Secondary | ICD-10-CM

## 2020-08-28 DIAGNOSIS — I4891 Unspecified atrial fibrillation: Secondary | ICD-10-CM

## 2020-08-28 DIAGNOSIS — J449 Chronic obstructive pulmonary disease, unspecified: Secondary | ICD-10-CM | POA: Diagnosis not present

## 2020-08-28 DIAGNOSIS — J90 Pleural effusion, not elsewhere classified: Secondary | ICD-10-CM

## 2020-08-28 LAB — CYTOLOGY - NON PAP

## 2020-08-28 NOTE — Progress Notes (Addendum)
Location:    [facility] Room Number: 149-D Place of Service:  SNF (31)   CODE STATUS: DNR  Allergies  Allergen Reactions  . Other   . Penicillin G   . Sulfonic Acid (3,5-Dibromo-4-H-Ox-Benz)   . Penicillins Rash  . Sulfonamide Derivatives Rash  . Tetanus Toxoid Rash    Chief Complaint  Patient presents with  . Follow-up    Emergency room follow-up 10/27-10/28    HPI:  He was taken to the ED for low 02 sat. He was found to have recurrent right pleural effusion. He had a thoracentesis performed for 580 mL. He tells me that he is feeling much better; is no longer short of breath; denies any cough; no chest pain. He will need to follow up with pulmonology regarding his recurrent effusion. He will continue to be followed for his chronic illnesses including: COPD with hypoxia/history of sarcoidosis:  Chronic diastolic congestive heart failure:  Atrial fibrillation with rapid ventricular response  Past Medical History:  Diagnosis Date  . Cancer (HCC)    melanoma-left side  . Chronic pain    legs and feet  . Coronary artery disease   . Dementia ([place])   . Diabetes mellitus without complication ([place])   . Foot drop   . GERD (gastroesophageal reflux disease)   . Gout   . Hyperlipidemia   . Hypertension   . OCD (obsessive compulsive disorder)   . PONV (postoperative nausea and vomiting)   . Psychosis ([place])   . Sarcoidosis   . Sleep apnea    cpap    Past Surgical History:  Procedure Laterality Date  . BACK SURGERY     cervical neck  . CATARACT EXTRACTION W/PHACO  01/05/2012   Procedure: CATARACT EXTRACTION PHACO AND INTRAOCULAR LENS PLACEMENT (IOC);  Surgeon: [name] Branch, MD;  Location: [facility];  Service: Ophthalmology;  Laterality: Left;  CDE 18.47  . CATARACT EXTRACTION W/PHACO  02/02/2012   Procedure: CATARACT EXTRACTION PHACO AND INTRAOCULAR LENS PLACEMENT (IOC);  Surgeon: [name] Branch, MD;  Location: [facility];  Service: Ophthalmology;  Laterality: Right;   CDE:15.38  . CHOLECYSTECTOMY    . hemorhoidectomy    . MELANOMA EXCISION     left side-[name]  . TONSILLECTOMY      Social History   Socioeconomic History  . Marital status: Married    Spouse name: Not on file  . Number of children: Not on file  . Years of education: Not on file  . Highest education level: Not on file  Occupational History  . Occupation: retired   Tobacco Use  . Smoking status: Former Smoker    Packs/day: 0.25    Years: 20.00    Pack years: 5.00    Types: Pipe    Quit date: 01/02/1974    Years since quitting: 46.6  . Smokeless tobacco: Never Used  Vaping Use  . Vaping Use: Never used  Substance and Sexual Activity  . Alcohol use: No  . Drug use: No  . Sexual activity: Not Currently  Other Topics Concern  . Not on file  Social History Narrative   Former pipe smoker, quit many years ago.   Long term resident of [facility]      Social Determinants of Health   Financial Resource Strain: Low Risk   . Difficulty of Paying Living Expenses: Not hard at all  Food Insecurity: No Food Insecurity  . Worried About Charity fundraiser in the Last Year: Never true  .  Ran Out of Food in the Last Year: Never true  Transportation Needs: No Transportation Needs  . Lack of Transportation (Medical): No  . Lack of Transportation (Non-Medical): No  Physical Activity: Inactive  . Days of Exercise per Week: 0 days  . Minutes of Exercise per Session: 0 min  Stress: No Stress Concern Present  . Feeling of Stress : Not at all  Social Connections: Socially Isolated  . Frequency of Communication with Friends and Family: Never  . Frequency of Social Gatherings with Friends and Family: Never  . Attends Religious Services: Never  . Active Member of Clubs or Organizations: No  . Attends Archivist Meetings: Not asked  . Marital Status: Married  Human resources officer Violence: Not At Risk  . Fear of Current or Ex-Partner: No  . Emotionally Abused: No  . Physically Abused:  No  . Sexually Abused: No   Family History  Problem Relation Age of Onset  . Heart disease Other   . Arthritis Other   . Cancer Other   . Anesthesia problems Neg Hx   . Hypotension Neg Hx   . Malignant hyperthermia Neg Hx   . Pseudochol deficiency Neg Hx       VITAL SIGNS BP 106/68   Pulse 81   Temp 98 F (36.7 C)   Resp 20   Ht 5\' 9"  (1.753 m)   Wt 202 lb (91.6 kg)   SpO2 95%   BMI 29.83 kg/m   Outpatient Encounter Medications as of 08/28/2020  Medication Sig  . acetaminophen (TYLENOL) 500 MG tablet Take 500 mg by mouth every 6 (six) hours as needed. For pain   . allopurinol (ZYLOPRIM) 300 MG tablet Take 300 mg by mouth daily.    . Amino Acids-Protein Hydrolys (FEEDING SUPPLEMENT, PRO-STAT SUGAR FREE 64,) LIQD Take 30 mLs by mouth in the morning and at bedtime.  [name] apixaban (ELIQUIS) 5 MG TABS tablet Take 5 mg by mouth 2 (two) times daily.  [name] EC 81 MG tablet Take 1 tablet (81 mg total) by mouth daily with breakfast. Swallow whole.  [name] (LIPITOR) 20 MG tablet Take 20 mg by mouth every evening.  Roseanne Kaufman [place]-Castor Oil [facility]) OINT Special Instructions: Apply to sacrum/coccyx and bilateral buttocks qshift prn erythema.  . cetirizine (ZYRTEC) 10 MG tablet Take 10 mg by mouth at bedtime.  [name] diltiazem (CARDIZEM CD) 120 MG 24 hr capsule Take 1 capsule (120 mg total) by mouth daily.  [name] donepezil (ARICEPT) 10 MG tablet Take 10 mg by mouth at bedtime.   . gabapentin (NEURONTIN) 100 MG capsule Take 100 mg by mouth at bedtime.  [name] ipratropium-albuterol (DUONEB) 0.5-2.5 (3) MG/3ML SOLN Take 3 mLs by nebulization every 6 (six) hours as needed.  . metoprolol succinate (TOPROL-XL) 50 MG 24 hr tablet Take 1 tablet (50 mg total) by mouth daily.  . midodrine (PROAMATINE) 10 MG tablet Take 1 tablet (10 mg total) by mouth 3 (three) times daily with meals.  . NON FORMULARY Pureed NAS diet with nectar thick liquids  . OXYGEN Inhale 2 L into the lungs continuous. for  shortness of breath to keep 02 sat above 90%  . pantoprazole (PROTONIX) 40 MG tablet Take 1 tablet (40 mg total) by mouth daily.  . potassium chloride SA (KLOR-CON) 20 MEQ tablet Take 20 mEq by mouth daily.  [name] torsemide (DEMADEX) 20 MG tablet Take 20 mg by mouth 2 (two) times daily. For Edema  . Vitamins A & D (  VITAMIN A & D) ointment Apply 1 application topically 3 (three) times daily as needed for dry skin (Apply A&D ointment to left heel every shift & as needed for erythema.).   [name] NON FORMULARY C-PAP from home, use home settings while sleeping At Bedtime 08:00 PM  . [DISCONTINUED] sodium chloride 0.45 % Inject 75 mLs into the vein See admin instructions. Parenteral solution; 75cc per hour, every shift for acute renal failure   No facility-administered encounter medications on file as of 08/28/2020.     SIGNIFICANT DIAGNOSTIC EXAMS  PREVIOUS;   12-17-19: CT of head and cervical spine:  1. No acute intracranial pathology. Small-vessel white matter disease. 2. No fracture or static subluxation of the cervical spine. 3. Anterior cervical discectomy and fusion of C4 through C6, with incorporation of the disc spaces and bony ankylosis of the included levels inferior to this through T3. 4. Prominent osteophytes, disc calcifications and calcifications of the ligamentum flavum, which appear to significantly narrow the cervical canal at C3-C4, minimum AP diameter approximately 4 mm.  02-13-20: MRI lumbar spine:  1. L2 fracture involving the anterior wall and superior endplate, likely subacute. The appearance suggest a hyperextension mechanism. No height loss. 2. L3-L4 severe spinal canal stenosis with severe right and moderate left neural foraminal stenosis. 3. T12-L1 moderate spinal canal stenosis and severe bilateral neural foraminal stenosis. 4. L1-L2 and L2-L3 severe right neural foraminal stenosis. 5. L5-S1 moderate bilateral neural foraminal stenosis.   02-13-20: MRI cervical spine:  1.  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 2. C4-6 ACDF without spinal canal stenosis. 3. Mild bilateral C6-7 neural foraminal stenosis.  03-20-20: Myoview:  No diagnostic ST segment changes to indicate ischemia. Small, moderate intensity, apical septal and apical to basal inferolateral defects. The inferolateral defect is fixed and consistent with possible scar and the apical septal defect is partially reversible consistent with a mild ischemic territory.This is a high risk study based on calculated LVEF. Would suggest confirmatory echocardiogram. Nuclear stress EF: 15%  04-10-20 2-d echo:  Left ventricular ejection fraction, by estimation, is 55 to 60%. The  left ventricle has normal function. The left ventricle has no regional  wall motion abnormalities. Left ventricular diastolic parameters are  consistent with age-related delayed  relaxation (normal).   07-20-20: chest x-Jadarion:   Opacification of most of the right hemithorax due to a combination of pleural effusion and consolidation. There is also consolidation in the medial left base. Heart borderline enlarged. There are foci of left carotid artery calcification.  07-27-20 2-d echo:  Left ventricular ejection fraction, by estimation, is 60 to 65%. The  left ventricle has normal function. There is mild left ventricular hypertrophy   07-29-20 chest x-Dominie:  1. Cardiomegaly. Pulmonary venous congestion. Bilateral interstitial prominence suggesting interstitial edema and or pneumonitis. Right-sided prominent pleural effusion again noted, decreased in size from prior chest x-Jaryan. 2.  Carotid vascular disease  07-28-20: right thoracentesis: Successful ultrasound guided RIGHT thoracentesis yielding 1280 mL of pleural fluid.  07-28-20: chest x-Diesel:  No pneumothorax following thoracentesis. Residual bibasilar pleural effusions and  atelectasis.  07-31-20: swallow study: Recommend continue with D1/puree diet and NECTAR THICK liquids with meds crushed in puree.   08-10-20: chest x-Junior: chf with mild to moderate right effusion seen. Mild interval progression noted.   TODAY  08-20-20: chest x-Renardo: Bilateral pleural effusions right greater than left increased from the prior exam  08-26-20: chest  x-Kellan:  1. Stable compared to 6 days ago. 2. Moderate right pleural effusion.  08-27-20: right thoracentesis: A total of approximately 580 mL of slightly cloudy yellow RIGHT pleural fluid was removed.    LABS REVIEWED PREVIOUS;   08-23-19: hgb a1c 5.6  10-05-19: wbc 4.8; hgb 11.8; hct 36.2; mcv 89.8 plt 256; glucose 100; bun 18; creat 0.53 ;k+ 3.9; an++ 128; ca 8.6; d-dimer: 3.36 ferritin 965 10-16-19: glucose 96; bun 14; creat 0.47; k+ 3.7; na++ 134; ca 8.5 12-02-19: wbc 5.9; hgb 12.4; hct 38.9; mcv 91.1 plt 227; chol 96; ldl 43; trig 45; hdl 44  01-30-20: chol 96; ldl 43; trig 45; hdl 44  06-11-20: wbc 5.5; hgb 12.5; hct 39.0 mcv 92.6 plt 184; glucose 99; bun 26; creat 0.68; k+ 4.6; na++ 131; ca 8.9 liver normal albumin 3.5 hgb a1c 5.5; tsh 2.617 07-20-20: wbc 5.1; hgb 13.0; hct 41.2; mcv 90.7 plt 176; glucose 110; bun 25; creat 0.52; k+ 4.5; na++ 129; ca 8.7  07-21-20: wbc 5.7; hgb 13.5; hct 42.0; mcv 91.1 plt 171 glucose 106; bun 21; creat 0.58; k+ 4.1; na++ 132; ca 9.0 07-23-20; glucose 110; bun 20; creat 0.62; k+ 3.4; na++ 133; ca 8.7 liver normal albumin 3.2  07-27-20: wbc 8.4; hgb 13.6; hct 41.4; mcv 90.6 plt 133; glucose 104; bun 29; creat 0.88; k+ 3.5; na++ 138; ca 8.4 liver normal albumin 3.4 tsh 2.324 blood and urine culture: no growth 07-29-20: wbc 8.1; hgb 13.0; hct 42.1; mcv 91.9 plt 145; glucose 101; bun 27; creat 0.81; k+ 3.5; na++ 139; ca 8.4 mag 1.8 08-01-20: wbc 7.4; hgb 14.3; hct 45.8; mcv 91.6 plt 169; glucose 89; bun 24; creat 0.74; k+ 4.7; na++ 140; ca 8.5 mag 1.8  08-13-20: BNP 432.0 08-20-20: glucose 113; bun  81; creat 1.46; k+ 4.2; na++ 150; ca 9.6 08-21-20: glucose 103; bun 66; creat 1.19 k+ 3.8; na++ 147; ca 8.8  08-24-20: glucose 108; bun 43; creat 0.98; k+ 3.6; na++ 144; ca 9.2   TODAY  08-26-20: wbc 7.4; hgb 14.4; hct 46.5; mcv 92.3 plt 255; glucose 94; bun 33; creat 0.91; k+ 3.8; na++ 144; ca 9.5 liver normal albumin 3.3 08-27-20: wbc 6.5; hgb 14.1; hct 45.7; mcv 92.7 plt 212; glucose 89; bun 26; creat 0.74; k+ 3.4; na++ 142; ca 8.9 liver normal albumin 3.1 LDH 131  Review of Systems  Constitutional: Negative for malaise/fatigue.  Respiratory: Negative for cough and shortness of breath.   Cardiovascular: Negative for chest pain, palpitations and leg swelling.  Gastrointestinal: Negative for abdominal pain, constipation and heartburn.  Musculoskeletal: Negative for back pain, joint pain and myalgias.  Skin: Negative for itching.  Neurological: Negative for dizziness.  Psychiatric/Behavioral: The patient is not nervous/anxious.     Physical Exam Constitutional:      General: He is not in acute distress.    Appearance: He is well-developed. He is not diaphoretic.  Eyes:     Comments: Bilateral cataract with lens implants     Neck:     Thyroid: No thyromegaly.  Cardiovascular:     Rate and Rhythm: Normal rate and regular rhythm.     Heart sounds: Normal heart sounds.  Pulmonary:     Effort: Pulmonary effort is normal. No respiratory distress.     Breath sounds: Normal breath sounds.  Abdominal:     General: Bowel sounds are normal. There is no distension.     Palpations: Abdomen is soft.     Tenderness: There is no abdominal  tenderness.  Musculoskeletal:        General: Normal range of motion.     Right lower leg: No edema.     Left lower leg: No edema.     Comments: Able to move all extremities   Lymphadenopathy:     Cervical: No cervical adenopathy.  Skin:    General: Skin is warm and dry.  Neurological:     Mental Status: He is alert. Mental status is at baseline.   Psychiatric:        Mood and Affect: Mood normal.      ASSESSMENT/ PLAN:  TODAY  1. Right pleural effusion is recurrent s/p thoracentesis 07-28-20 and repeated on 08-27-20 for 580 mL.  2. COPD with hypoxia/history of sarcoidosis: is stable will continue duoneb every 6 hours as needed   3. Chronic diastolic congestive heart failure: is stable EF 60-65% (07-27-20): will continue demadex 20 mg twice daily with k+ 20 meq daily   4. Atrial fibrillation with rapid ventricular response: heart failure stable will continue toprol xl 50 mg daily for rate control and eliquis 5 mg twice daily    PREVIOUS     5. Sleep apnea obstructive: is stable will decline CPAP most times  6. 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 History of melanoma is stable history of excision of left flank  8. Coronary artery disease of native artery of native heart with stable angina: is stable will continue toprol xl 50 mg daily   9. Hypotension due to drugs: is stable b/p 106/68 will continue midodrine 10 mg three times daily   10. Right pleural effusion: status post thoracentesis 07-28-20. Is still present on x-Damani 08-10-20    11. GERD without esophagitis: is stable will continue protonix 40 mg daily   12. Vascular dementia without behavioral disturbance: is without change weight is 202 pounds; will continue aricept 10 mg daily  13. Chronic non-seasonal allergic rhinitis: will continue zyrtec 10 mg daily   14. Hypertensive heart disease with chronic diastolic congestive heart failure: is stable b/b 121/86 will continue toprol xl 50 mg daily   15. Radiculopathy of cervical spine: is stable will continue gabapentin 100 mg daily mobic has been stopped.   16. Dyslipidemia is stable LDL 43 will continue lipitor 20 mg daily     Will repeat cxr and bmp .   MD is aware of resident's narcotic use and is in agreement with current plan of care.  We will attempt to wean resident as appropriate.  Ok Edwards NP [facility]  Contact [phone] Monday through Friday 8am- 5pm  After hours call [phone]

## 2020-09-01 ENCOUNTER — Non-Acute Institutional Stay (SKILLED_NURSING_FACILITY): Payer: Medicare Other | Admitting: Adult Health

## 2020-09-01 ENCOUNTER — Encounter: Payer: Self-pay | Admitting: Adult Health

## 2020-09-01 DIAGNOSIS — M5412 Radiculopathy, cervical region: Secondary | ICD-10-CM

## 2020-09-01 DIAGNOSIS — I110 Hypertensive heart disease with heart failure: Secondary | ICD-10-CM | POA: Diagnosis not present

## 2020-09-01 DIAGNOSIS — J449 Chronic obstructive pulmonary disease, unspecified: Secondary | ICD-10-CM | POA: Diagnosis not present

## 2020-09-01 DIAGNOSIS — I4891 Unspecified atrial fibrillation: Secondary | ICD-10-CM | POA: Diagnosis not present

## 2020-09-01 DIAGNOSIS — F0150 Vascular dementia without behavioral disturbance: Secondary | ICD-10-CM

## 2020-09-01 DIAGNOSIS — I5032 Chronic diastolic (congestive) heart failure: Secondary | ICD-10-CM

## 2020-09-01 DIAGNOSIS — Z85820 Personal history of malignant melanoma of skin: Secondary | ICD-10-CM

## 2020-09-01 DIAGNOSIS — Z862 Personal history of diseases of the blood and blood-forming organs and certain disorders involving the immune mechanism: Secondary | ICD-10-CM

## 2020-09-01 DIAGNOSIS — R0902 Hypoxemia: Secondary | ICD-10-CM

## 2020-09-01 LAB — CULTURE, BODY FLUID W GRAM STAIN -BOTTLE: Culture: NO GROWTH

## 2020-09-01 NOTE — Progress Notes (Signed)
Provider: Phillips Grout NP  Location   [place]  PCP: Gerlene Fee, NP   Extended Emergency Contact Information Primary Emergency Contact: [facility] Address: [address]          [place], [place] [address] [place] Phone: [phone] Relation: Spouse  Codes status: DNR Goals of care: advanced directive information Advanced Directives 09/01/2020  Does Patient Have a Medical Advance Directive? Yes  Type of Paramedic of [place];Living will;Out of facility DNR (pink MOST or yellow form)  Does patient want to make changes to medical advance directive? No - Patient declined  Copy of [place] in Chart? Yes - validated most recent copy scanned in chart (See row information)  Would patient like information on creating a medical advance directive? -  Pre-existing out of facility DNR order (yellow form or pink MOST form) Yellow form placed in chart (order not valid for inpatient use)     Allergies  Allergen Reactions  . Other   . Penicillin G   . Sulfonic Acid (3,5-Dibromo-4-H-Ox-Benz)   . Penicillins Rash  . Sulfonamide Derivatives Rash  . Tetanus Toxoid Rash    Chief Complaint  Patient presents with  . Annual Exam    Annual Exam    HPI  He is a 86 year old long term resident of this facility being seen for his annual exam. He has been hospitalized in October for pneumonia large pleural effusion. He has required 2 thoracentesis in October. He continues to be total care with his adls; is incontinent of bladder and bowel. He denies any pain; no cough; no shortness of breath. His appetite is stable his weight is stable. He continues to be followed for his chronic illnesses including: Right pleural effusion  COPD with hypoxia/history of sarcoidosis:  Chronic diastolic congestive heart failure:   Past Medical History:  Diagnosis Date  . Cancer (HCC)    melanoma-left side  . Chronic pain     legs and feet  . Coronary artery disease   . Dementia ([place])   . Diabetes mellitus without complication ([place])   . Foot drop   . GERD (gastroesophageal reflux disease)   . Gout   . Hyperlipidemia   . Hypertension   . OCD (obsessive compulsive disorder)   . PONV (postoperative nausea and vomiting)   . Psychosis ([place])   . Sarcoidosis   . Sleep apnea    cpap   Past Surgical History:  Procedure Laterality Date  . BACK SURGERY     cervical neck  . CATARACT EXTRACTION W/PHACO  01/05/2012   Procedure: CATARACT EXTRACTION PHACO AND INTRAOCULAR LENS PLACEMENT (IOC);  Surgeon: [name] Branch, MD;  Location: [facility];  Service: Ophthalmology;  Laterality: Left;  CDE 18.47  . CATARACT EXTRACTION W/PHACO  02/02/2012   Procedure: CATARACT EXTRACTION PHACO AND INTRAOCULAR LENS PLACEMENT (IOC);  Surgeon: [name] Branch, MD;  Location: [facility];  Service: Ophthalmology;  Laterality: Right;  CDE:15.38  . CHOLECYSTECTOMY    . hemorhoidectomy    . MELANOMA EXCISION     left side-[name]  . TONSILLECTOMY      reports that he quit smoking about 46 years ago. His smoking use included pipe. He has a 5.00 pack-year smoking history. He has never used smokeless tobacco. He reports that he does not drink alcohol and does not use drugs. Social History   Tobacco Use  . Smoking status: Former Smoker    Packs/day: 0.25  Years: 20.00    Pack years: 5.00    Types: Pipe    Quit date: 01/02/1974    Years since quitting: 46.6  . Smokeless tobacco: Never Used  Vaping Use  . Vaping Use: Never used  Substance Use Topics  . Alcohol use: No  . Drug use: No   Family History  Problem Relation Age of Onset  . Heart disease Other   . Arthritis Other   . Cancer Other   . Anesthesia problems Neg Hx   . Hypotension Neg Hx   . Malignant hyperthermia Neg Hx   . Pseudochol deficiency Neg Hx     Pertinent  Health Maintenance Due  Topic Date Due  . PNA vac Low Risk Adult (2 of 2 - PPSV23) 08/04/2020  . INFLUENZA VACCINE   Completed   Fall Risk  09/18/2019  Falls in the past year? 1  Number falls in past yr: 1  Injury with Fall? 1  Risk for fall due to : History of fall(s);Impaired balance/gait;Impaired mobility   Depression screen [facility] 2/9 09/18/2019  Decreased Interest 0  Down, Depressed, Hopeless 0  PHQ - 2 Score 0        Outpatient Encounter Medications as of 09/01/2020  Medication Sig  . acetaminophen (TYLENOL) 500 MG tablet Take 500 mg by mouth every 6 (six) hours as needed. For pain   . allopurinol (ZYLOPRIM) 300 MG tablet Take 300 mg by mouth daily.    . Amino Acids-Protein Hydrolys (FEEDING SUPPLEMENT, PRO-STAT SUGAR FREE 64,) LIQD Take 30 mLs by mouth in the morning and at bedtime.  [name] apixaban (ELIQUIS) 5 MG TABS tablet Take 5 mg by mouth 2 (two) times daily.  [name] EC 81 MG tablet Take 1 tablet (81 mg total) by mouth daily with breakfast. Swallow whole.  [name] (LIPITOR) 20 MG tablet Take 20 mg by mouth every evening.  Roseanne Kaufman [facility]) OINT Special Instructions: Apply to sacrum/coccyx and bilateral buttocks qshift prn erythema.  . cetirizine (ZYRTEC) 10 MG tablet Take 10 mg by mouth at bedtime.  [name] diltiazem (CARDIZEM CD) 120 MG 24 hr capsule Take 1 capsule (120 mg total) by mouth daily.  [name] donepezil (ARICEPT) 10 MG tablet Take 10 mg by mouth at bedtime.   . gabapentin (NEURONTIN) 100 MG capsule Take 100 mg by mouth at bedtime.  [name] ipratropium-albuterol (DUONEB) 0.5-2.5 (3) MG/3ML SOLN Take 3 mLs by nebulization every 6 (six) hours as needed.  . metoprolol succinate (TOPROL-XL) 50 MG 24 hr tablet Take 1 tablet (50 mg total) by mouth daily.  . midodrine (PROAMATINE) 10 MG tablet Take 1 tablet (10 mg total) by mouth 3 (three) times daily with meals.  . NON FORMULARY Pureed NAS diet with nectar thick liquids  . OXYGEN Inhale 2 L into the lungs continuous. for shortness of breath to keep 02 sat above 90%  . pantoprazole (PROTONIX) 40 MG tablet Take 1 tablet (40 mg  total) by mouth daily.  . potassium chloride SA (KLOR-CON) 20 MEQ tablet Take 20 mEq by mouth daily.  [name] torsemide (DEMADEX) 20 MG tablet Take 20 mg by mouth 2 (two) times daily. For Edema  . Vitamins A & D (VITAMIN A & D) ointment Apply A&D ointment to bilateral heels qshift. Every Shift Day, Evening, Night  . [DISCONTINUED] NON FORMULARY C-PAP from home, use home settings while sleeping At Bedtime 08:00 PM   No facility-administered encounter medications on file as of 09/01/2020.  Vitals:   09/01/20 1055  BP: 118/73  Pulse: (!) 109  Resp: 20  Temp: (!) 97.3 F (36.3 C)  SpO2: 96%  Weight: 202 lb 9.6 oz (91.9 kg)  Height: 5\' 9"  (1.753 m)   Body mass index is 29.92 kg/m.  DIAGNOSTIC EXAMS   PREVIOUS;   12-17-19: CT of head and cervical spine:  1. No acute intracranial pathology. Small-vessel white matter disease. 2. No fracture or static subluxation of the cervical spine. 3. Anterior cervical discectomy and fusion of C4 through C6, with incorporation of the disc spaces and bony ankylosis of the included levels inferior to this through T3. 4. Prominent osteophytes, disc calcifications and calcifications of the ligamentum flavum, which appear to significantly narrow the cervical canal at C3-C4, minimum AP diameter approximately 4 mm.  02-13-20: MRI lumbar spine:  1. L2 fracture involving the anterior wall and superior endplate, likely subacute. The appearance suggest a hyperextension mechanism. No height loss. 2. L3-L4 severe spinal canal stenosis with severe right and moderate left neural foraminal stenosis. 3. T12-L1 moderate spinal canal stenosis and severe bilateral neural foraminal stenosis. 4. L1-L2 and L2-L3 severe right neural foraminal stenosis. 5. L5-S1 moderate bilateral neural foraminal stenosis.   02-13-20: MRI cervical spine:  1. 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 2. C4-6 ACDF without spinal canal stenosis. 3. Mild bilateral C6-7 neural foraminal stenosis.  03-20-20: Myoview:  No diagnostic ST segment changes to indicate ischemia. Small, moderate intensity, apical septal and apical to basal inferolateral defects. The inferolateral defect is fixed and consistent with possible scar and the apical septal defect is partially reversible consistent with a mild ischemic territory.This is a high risk study based on calculated LVEF. Would suggest confirmatory echocardiogram. Nuclear stress EF: 15%  04-10-20 2-d echo:  Left ventricular ejection fraction, by estimation, is 55 to 60%. The  left ventricle has normal function. The left ventricle has no regional  wall motion abnormalities. Left ventricular diastolic parameters are  consistent with age-related delayed  relaxation (normal).   07-20-20: chest x-[place]:   Opacification of most of the right hemithorax due to a combination of pleural effusion and consolidation. There is also consolidation in the medial left base. Heart borderline enlarged. There are foci of left carotid artery calcification.  07-27-20 2-d echo:  Left ventricular ejection fraction, by estimation, is 60 to 65%. The  left ventricle has normal function. There is mild left ventricular hypertrophy   07-29-20 chest x-[place]:  1. Cardiomegaly. Pulmonary venous congestion. Bilateral interstitial prominence suggesting interstitial edema and or pneumonitis. Right-sided prominent pleural effusion again noted, decreased in size from prior chest x-Demitri. 2.  Carotid vascular disease  07-28-20: right thoracentesis: Successful ultrasound guided RIGHT thoracentesis yielding 1280 mL of pleural fluid.  07-28-20: chest x-[place]:  No pneumothorax following thoracentesis. Residual bibasilar pleural effusions and atelectasis.  07-31-20: swallow study: Recommend continue with D1/puree diet and NECTAR THICK liquids with meds crushed in  puree.   08-10-20: chest x-[place]: chf with mild to moderate right effusion seen. Mild interval progression noted.   08-20-20: chest x-[place]: Bilateral pleural effusions right greater than left increased from the prior exam  08-26-20: chest x-[place]:  1. Stable compared to 6 days ago. 2. Moderate right pleural effusion.  08-27-20: right thoracentesis: A total of approximately 580 mL of slightly cloudy yellow RIGHT pleural fluid was removed.  NO NEW EXAMS.   LABS REVIEWED PREVIOUS;  10-05-19: wbc 4.8; hgb 11.8; hct 36.2; mcv 89.8 plt 256; glucose 100; bun 18; creat 0.53 ;k+ 3.9; an++ 128; ca 8.6; d-dimer: 3.36 ferritin 965 10-16-19: glucose 96; bun 14; creat 0.47; k+ 3.7; na++ 134; ca 8.5 12-02-19: wbc 5.9; hgb 12.4; hct 38.9; mcv 91.1 plt 227; chol 96; ldl 43; trig 45; hdl 44  01-30-20: chol 96; ldl 43; trig 45; hdl 44  06-11-20: wbc 5.5; hgb 12.5; hct 39.0 mcv 92.6 plt 184; glucose 99; bun 26; creat 0.68; k+ 4.6; na++ 131; ca 8.9 liver normal albumin 3.5 hgb a1c 5.5; tsh 2.617 07-20-20: wbc 5.1; hgb 13.0; hct 41.2; mcv 90.7 plt 176; glucose 110; bun 25; creat 0.52; k+ 4.5; na++ 129; ca 8.7  07-21-20: wbc 5.7; hgb 13.5; hct 42.0; mcv 91.1 plt 171 glucose 106; bun 21; creat 0.58; k+ 4.1; na++ 132; ca 9.0 07-23-20; glucose 110; bun 20; creat 0.62; k+ 3.4; na++ 133; ca 8.7 liver normal albumin 3.2  07-27-20: wbc 8.4; hgb 13.6; hct 41.4; mcv 90.6 plt 133; glucose 104; bun 29; creat 0.88; k+ 3.5; na++ 138; ca 8.4 liver normal albumin 3.4 tsh 2.324 blood and urine culture: no growth 07-29-20: wbc 8.1; hgb 13.0; hct 42.1; mcv 91.9 plt 145; glucose 101; bun 27; creat 0.81; k+ 3.5; na++ 139; ca 8.4 mag 1.8 08-01-20: wbc 7.4; hgb 14.3; hct 45.8; mcv 91.6 plt 169; glucose 89; bun 24; creat 0.74; k+ 4.7; na++ 140; ca 8.5 mag 1.8  08-13-20: BNP 432.0 08-20-20: glucose 113; bun 81; creat 1.46; k+ 4.2; na++ 150; ca 9.6 08-21-20: glucose 103; bun 66; creat 1.19 k+ 3.8; na++ 147; ca 8.8  08-24-20: glucose 108; bun 43;  creat 0.98; k+ 3.6; na++ 144; ca 9.2  08-26-20: wbc 7.4; hgb 14.4; hct 46.5; mcv 92.3 plt 255; glucose 94; bun 33; creat 0.91; k+ 3.8; na++ 144; ca 9.5 liver normal albumin 3.3 08-27-20: wbc 6.5; hgb 14.1; hct 45.7; mcv 92.7 plt 212; glucose 89; bun 26; creat 0.74; k+ 3.4; na++ 142; ca 8.9 liver normal albumin 3.1 LDH 131  NO NEW LABS.   Review of Systems  Constitutional: Negative for malaise/fatigue.  Respiratory: Negative for cough and shortness of breath.   Cardiovascular: Negative for chest pain, palpitations and leg swelling.  Gastrointestinal: Negative for abdominal pain, constipation and heartburn.  Musculoskeletal: Negative for back pain, joint pain and myalgias.  Skin: Negative.   Neurological: Negative for dizziness.  Psychiatric/Behavioral: The patient is not nervous/anxious.     Physical Exam Constitutional:      General: He is not in acute distress.    Appearance: He is well-developed. He is not diaphoretic.  HENT:     Nose: Nose normal.     Mouth/Throat:     Mouth: Mucous membranes are moist.     Pharynx: Oropharynx is clear.  Eyes:     Conjunctiva/sclera: Conjunctivae normal.     Comments: Bilateral cataract with lens implants      Neck:     Thyroid: No thyromegaly.  Cardiovascular:     Rate and Rhythm: Normal rate and regular rhythm.     Pulses: Normal pulses.     Heart sounds: Normal heart sounds.  Pulmonary:     Effort: Pulmonary effort is normal. No respiratory distress.     Breath sounds: Normal breath sounds.  Abdominal:     General: Bowel sounds are normal. There is no distension.     Palpations: Abdomen is soft.     Tenderness: There is no  abdominal tenderness.  Musculoskeletal:     Cervical back: Neck supple.     Right lower leg: No edema.     Left lower leg: No edema.     Comments: Is able to move all extremities   Lymphadenopathy:     Cervical: No cervical adenopathy.  Skin:    General: Skin is warm and dry.  Neurological:     Mental  Status: He is alert. Mental status is at baseline.  Psychiatric:        Mood and Affect: Mood normal.      ASSESSMENT/ PLAN:  TODAY  1. Right pleural effusion is recurrent s/p thoracentesis 07-28-20 and repeated on 08-27-20 for 580 mL.  2. COPD with hypoxia/history of sarcoidosis: is stable will continue duoneb every 6 hours as needed   3. Chronic diastolic congestive heart failure: is stable EF 60-65% (07-27-20): will continue demadex 20 mg twice daily with k+ 20 meq daily   4. Atrial fibrillation with rapid ventricular response: heart failure stable will continue toprol xl 50 mg daily for rate control and eliquis 5 mg twice daily     5. Sleep apnea obstructive: is without change CPAP stopped due to his continues declining of this treatment   6. 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 History of melanoma is stable history of excision of left flank  8. Coronary artery disease of native artery of native heart with stable angina: is stable will continue toprol xl 50 mg daily   9. Hypotension due to drugs: is stable b/p 106/68 will continue midodrine 10 mg three times daily   10. Right pleural effusion: status post thoracentesis 07-28-20. Is still present on x-[place] 08-10-20    11. GERD without esophagitis: is stable will continue protonix 40 mg daily   12. Vascular dementia without behavioral disturbance: is without change weight is 202 pounds; will continue aricept 10 mg daily  13. Chronic non-seasonal allergic rhinitis: will continue zyrtec 10 mg daily   14. Hypertensive heart disease with chronic diastolic congestive heart failure: is stable b/b 121/86 will continue toprol xl 50 mg daily   15. Radiculopathy of cervical spine: is stable will continue gabapentin 100 mg daily mobic has been stopped.   16. Dyslipidemia is stable LDL 43 will continue lipitor 20 mg daily       MD is aware of resident's  narcotic use and is in agreement with current plan of care. We will attempt to wean resident as appropriate.  Ok Edwards NP [facility]  Contact [phone] Monday through Friday 8am- 5pm  After hours call [phone]

## 2020-09-03 ENCOUNTER — Other Ambulatory Visit (HOSPITAL_COMMUNITY)
Admission: RE | Admit: 2020-09-03 | Discharge: 2020-09-03 | Disposition: A | Discharge status: Home / Self Care | Payer: Medicare Other | Source: Skilled Nursing Facility | Attending: Adult Health | Admitting: Adult Health

## 2020-09-03 DIAGNOSIS — I10 Essential (primary) hypertension: Secondary | ICD-10-CM | POA: Insufficient documentation

## 2020-09-03 LAB — BASIC METABOLIC PANEL
Anion gap: 9 (ref 5–15)
BUN: 26 mg/dL — ABNORMAL HIGH (ref 8–23)
CO2: 29 mmol/L (ref 22–32)
Calcium: 8.8 mg/dL — ABNORMAL LOW (ref 8.9–10.3)
Chloride: 102 mmol/L (ref 98–111)
Creatinine, Ser: 0.74 mg/dL (ref 0.61–1.24)
GFR, Estimated: >60 mL/min (ref >60)
Glucose, Bld: 90 mg/dL (ref 70–99)
Potassium: 3.3 mmol/L — ABNORMAL LOW (ref 3.5–5.1)
Sodium: 140 mmol/L (ref 135–145)

## 2020-09-11 ENCOUNTER — Ambulatory Visit (INDEPENDENT_AMBULATORY_CARE_PROVIDER_SITE_OTHER): Payer: Medicare Other | Admitting: Student

## 2020-09-11 ENCOUNTER — Encounter: Payer: Self-pay | Admitting: Student

## 2020-09-11 VITALS — BP 114/60 | HR 58

## 2020-09-11 DIAGNOSIS — I951 Orthostatic hypotension: Secondary | ICD-10-CM | POA: Diagnosis not present

## 2020-09-11 DIAGNOSIS — E785 Hyperlipidemia, unspecified: Secondary | ICD-10-CM

## 2020-09-11 DIAGNOSIS — I4819 Other persistent atrial fibrillation: Secondary | ICD-10-CM

## 2020-09-11 DIAGNOSIS — I2510 Atherosclerotic heart disease of native coronary artery without angina pectoris: Secondary | ICD-10-CM | POA: Diagnosis not present

## 2020-09-11 NOTE — Patient Instructions (Signed)
Medication Instructions:  Your physician recommends that you continue on your current medications as directed. Please refer to the Current Medication list given to you today.  *If you need a refill on your cardiac medications before your next appointment, please call your pharmacy*   Lab Work: NONE   If you have labs (blood work) drawn today and your tests are completely normal, you will receive your results only by: . MyChart Message (if you have MyChart) OR . A paper copy in the mail If you have any lab test that is abnormal or we need to change your treatment, we will call you to review the results.   Testing/Procedures: NONE    Follow-Up: At [facility], you and your health needs are our priority.  As part of our continuing mission to provide you with exceptional heart care, we have created designated Provider Care Teams.  These Care Teams include your primary Cardiologist (physician) and Advanced Practice Providers (APPs -  Physician Assistants and Nurse Practitioners) who all work together to provide you with the care you need, when you need it.  We recommend signing up for the patient portal called "MyChart".  Sign up information is provided on this After Visit Summary.  MyChart is used to connect with patients for Virtual Visits (Telemedicine).  Patients are able to view lab/test results, encounter notes, upcoming appointments, etc.  Non-urgent messages can be sent to your provider as well.   To learn more about what you can do with MyChart, go to [email or web address].    Your next appointment:   6 month(s)  The format for your next appointment:   In Person  Provider:   Jonathan Branch, MD   Other Instructions Thank you for choosing [facility]!    

## 2020-09-11 NOTE — Progress Notes (Signed)
Cardiology Office Note    Date:  09/11/2020   ID:  PHELAN SCHADT, DOB [birth year], MRN [id number]  PCP:  Gerlene Fee, NP  Cardiologist: Carlyle Dolly, MD    Chief Complaint  Patient presents with  . Hospitalization Follow-up    History of Present Illness:    Eric Bowman is a 86 y.o. male with past medical history of CAD (s/p prior stenting with details unavailable), HTN, HLD, OSA (not on CPAP), GERD and dementia who presents to the office today for hospital follow-up.  Cardiology was consulted on the patient during admission in 07/2020 for newly documented atrial fibrillation with RVR. A repeat limited echocardiogram was performed which showed a preserved EF of 60 to 65%. He was started on Eliquis for anticoagulation and a rate control strategy was recommended as he was asymptomatic with the arrhythmia. He initially required IV Cardizem but was transitioned to Toprol-XL 75 mg daily at the time of discharge. He did have a right pleural effusion on admission and underwent thoracentesis with 1280 mL of fluid removed and he was restarted on Torsemide 20mg  daily at discharge.  He was again admitted from 10/27 -08/27/2020 from the [facility] after being found to be hypoxic and was in atrial fibrillation with RVR and heart rate in the 140's. He did undergo a repeat thoracentesis with 580 mL of fluid removed. The hospitalist team did reduce his Toprol-XL to 50 mg daily and he was started on Cardizem CD 120 mg daily. Further titration was limited secondary to hypotension and he was continued on Midodrine 10 mg 3 times daily.  In talking with the patient today, he is not able to contribute to history much secondary to his dementia but denies any recurrent dyspnea. No recent chest pain or palpitations. No reports of orthopnea or lower extremity edema. The [facility] did provide a list of his recent vitals and his heart rate has been variable from the 60's up to 100 bpm. Blood pressure has  been stable with SBP in the 110's to 120's.  He is wheelchair-bound at baseline and uses a lift for transfers.   Past Medical History:  Diagnosis Date  . Cancer (HCC)    melanoma-left side  . Chronic pain    legs and feet  . Coronary artery disease   . Dementia ([place])   . Diabetes mellitus without complication ([place])   . Foot drop   . GERD (gastroesophageal reflux disease)   . Gout   . Hyperlipidemia   . Hypertension   . OCD (obsessive compulsive disorder)   . PONV (postoperative nausea and vomiting)   . Psychosis ([place])   . Sarcoidosis   . Sleep apnea    cpap    Past Surgical History:  Procedure Laterality Date  . BACK SURGERY     cervical neck  . CATARACT EXTRACTION W/PHACO  01/05/2012   Procedure: CATARACT EXTRACTION PHACO AND INTRAOCULAR LENS PLACEMENT (IOC);  Surgeon: [name] Branch, MD;  Location: [facility];  Service: Ophthalmology;  Laterality: Left;  CDE 18.47  . CATARACT EXTRACTION W/PHACO  02/02/2012   Procedure: CATARACT EXTRACTION PHACO AND INTRAOCULAR LENS PLACEMENT (IOC);  Surgeon: [name] Branch, MD;  Location: [facility];  Service: Ophthalmology;  Laterality: Right;  CDE:15.38  . CHOLECYSTECTOMY    . hemorhoidectomy    . MELANOMA EXCISION     left side-[name]  . TONSILLECTOMY      Current Medications: Outpatient Medications Prior to Visit  Medication Sig Dispense Refill  .  acetaminophen (TYLENOL) 500 MG tablet Take 500 mg by mouth every 6 (six) hours as needed. For pain     . allopurinol (ZYLOPRIM) 300 MG tablet Take 300 mg by mouth daily.      . Amino Acids-Protein Hydrolys (FEEDING SUPPLEMENT, PRO-STAT SUGAR FREE 64,) LIQD Take 30 mLs by mouth in the morning and at bedtime.    [name] apixaban (ELIQUIS) 5 MG TABS tablet Take 5 mg by mouth 2 (two) times daily.    [name] EC 81 MG tablet Take 1 tablet (81 mg total) by mouth daily with breakfast. Swallow whole. 30 tablet 11  . atorvastatin (LIPITOR) 20 MG tablet Take 20 mg by mouth every evening.    Roseanne Kaufman [place]-Castor Oil  [facility]) OINT Special Instructions: Apply to sacrum/coccyx and bilateral buttocks qshift prn erythema.    . cetirizine (ZYRTEC) 10 MG tablet Take 10 mg by mouth at bedtime.    [name] diltiazem (CARDIZEM CD) 120 MG 24 hr capsule Take 1 capsule (120 mg total) by mouth daily. 30 capsule 11  . donepezil (ARICEPT) 10 MG tablet Take 10 mg by mouth at bedtime.     . gabapentin (NEURONTIN) 100 MG capsule Take 100 mg by mouth at bedtime.    [name] ipratropium-albuterol (DUONEB) 0.5-2.5 (3) MG/3ML SOLN Take 3 mLs by nebulization every 6 (six) hours as needed.    . metoprolol succinate (TOPROL-XL) 50 MG 24 hr tablet Take 1 tablet (50 mg total) by mouth daily. 30 tablet 3  . midodrine (PROAMATINE) 10 MG tablet Take 1 tablet (10 mg total) by mouth 3 (three) times daily with meals. 30 tablet 3  . NON FORMULARY Pureed NAS diet with nectar thick liquids    . OXYGEN Inhale 2 L into the lungs continuous. for shortness of breath to keep 02 sat above 90%    . pantoprazole (PROTONIX) 40 MG tablet Take 1 tablet (40 mg total) by mouth daily.    . potassium chloride SA (KLOR-CON) 20 MEQ tablet Take 20 mEq by mouth daily.    [name] torsemide (DEMADEX) 20 MG tablet Take 20 mg by mouth 2 (two) times daily. For Edema    . Vitamins A & D (VITAMIN A & D) ointment Apply A&D ointment to bilateral heels qshift. Every Shift Day, Evening, Night     No facility-administered medications prior to visit.     Allergies:   Other; Penicillin g; Sulfonic acid (3,5-dibromo-4-h-ox-benz); Penicillins; Sulfonamide derivatives; and Tetanus toxoid   Social History   Socioeconomic History  . Marital status: Married    Spouse name: Not on file  . Number of children: Not on file  . Years of education: Not on file  . Highest education level: Not on file  Occupational History  . Occupation: retired   Tobacco Use  . Smoking status: Former Smoker    Packs/day: 0.25    Years: 20.00    Pack years: 5.00    Types: Pipe    Quit date: 01/02/1974    Years  since quitting: 46.7  . Smokeless tobacco: Never Used  Vaping Use  . Vaping Use: Never used  Substance and Sexual Activity  . Alcohol use: No  . Drug use: No  . Sexual activity: Not Currently  Other Topics Concern  . Not on file  Social History Narrative   Former pipe smoker, quit many years ago.   Long term resident of [facility]      Social Determinants of Health   Financial Resource Strain: Low Risk   .  Difficulty of Paying Living Expenses: Not hard at all  Food Insecurity: No Food Insecurity  . Worried About Charity fundraiser in the Last Year: Never true  . Ran Out of Food in the Last Year: Never true  Transportation Needs: No Transportation Needs  . Lack of Transportation (Medical): No  . Lack of Transportation (Non-Medical): No  Physical Activity: Inactive  . Days of Exercise per Week: 0 days  . Minutes of Exercise per Session: 0 min  Stress: No Stress Concern Present  . Feeling of Stress : Not at all  Social Connections: Socially Isolated  . Frequency of Communication with Friends and Family: Never  . Frequency of Social Gatherings with Friends and Family: Never  . Attends Religious Services: Never  . Active Member of Clubs or Organizations: No  . Attends Archivist Meetings: Not asked  . Marital Status: Married     Family History:  The patient's family history includes Arthritis in an other family member; Cancer in an other family member; Heart disease in an other family member.   Review of Systems:   Please see the history of present illness.     General:  No chills, fever, night sweats or weight changes.  Cardiovascular:  No chest pain, edema, orthopnea, palpitations, paroxysmal nocturnal dyspnea. Positive for dyspnea (resolved).  Dermatological: No rash, lesions/masses Respiratory: No cough. Urologic: No hematuria, dysuria Abdominal:   No nausea, vomiting, diarrhea, bright red blood per rectum, melena, or hematemesis Neurologic:  No visual changes,  wkns, changes in mental status. All other systems reviewed and are otherwise negative except as noted above.   Physical Exam:    VS:  BP 114/60   Pulse (!) 58   SpO2 97%    General: Caucasian male appearing in no acute distress. Sitting in wheelchair.  Head: Normocephalic, atraumatic. Neck: No carotid bruits. JVD not elevated.  Lungs: Respirations regular and unlabored, without wheezes or rales.  Heart: Irregularly irregular. No S3 or S4.  2/6 SEM along RUSB.  Abdomen: Appears non-distended. No obvious abdominal masses. Msk:  Strength and tone appear normal for age. No obvious joint deformities or effusions. Extremities: No clubbing or cyanosis. No lower extremity edema.  Distal pedal pulses are 2+ bilaterally. Neuro: Alert and oriented X 2 (person/place). Moves all extremities spontaneously. No focal deficits noted. Psych:  Responds to questions appropriately with a normal affect. Skin: No rashes or lesions noted  Wt Readings from Last 3 Encounters:  09/01/20 202 lb 9.6 oz (91.9 kg)  08/28/20 202 lb (91.6 kg)  08/26/20 200 lb 9.9 oz (91 kg)     Studies/Labs Reviewed:   EKG:  EKG is not ordered today. EKG from 08/26/2020 is reviewed and shows atrial fibrillation with RVR, HR 140.  Recent Labs: 07/27/2020: TSH 2.324 08/01/2020: Magnesium 1.8 08/13/2020: B Natriuretic Peptide 432.0 08/27/2020: ALT 15; Hemoglobin 14.1; Platelets 212 09/03/2020: BUN 26; Creatinine, Ser 0.74; Potassium 3.3; Sodium 140   Lipid Panel    Component Value Date/Time   CHOL 96 01/30/2020 0838   TRIG 45 01/30/2020 0838   HDL 44 01/30/2020 0838   CHOLHDL 2.2 01/30/2020 0838   VLDL 9 01/30/2020 0838   LDLCALC 43 01/30/2020 0838    Additional studies/ records that were reviewed today include:   NST: 02/2020  No diagnostic ST segment changes to indicate ischemia.  Small, moderate intensity, apical septal and apical to basal inferolateral defects. The inferolateral defect is fixed and consistent  with possible scar and the apical  septal defect is partially reversible consistent with a mild ischemic territory.  This is a high risk study based on calculated LVEF. Would suggest confirmatory echocardiogram.  Nuclear stress EF: 15%.  Echo Complete: 03/2020 IMPRESSIONS    1. Left ventricular ejection fraction, by estimation, is 55 to 60%. The  left ventricle has normal function. The left ventricle has no regional  wall motion abnormalities. Left ventricular diastolic parameters are  consistent with age-related delayed  relaxation (normal).  2. Right ventricular systolic function is normal. The right ventricular  size is normal. There is normal pulmonary artery systolic pressure.  3. The mitral valve is normal in structure. No evidence of mitral valve  regurgitation. No evidence of mitral stenosis.  4. The aortic valve is tricuspid. Aortic valve regurgitation is not  visualized. Mild to moderate aortic valve sclerosis/calcification is  present, without any evidence of aortic stenosis.  5. The inferior vena cava is normal in size with greater than 50%  respiratory variability, suggesting right atrial pressure of 3 mmHg.    Limited Echo: 07/2020 IMPRESSIONS    1. Left ventricular ejection fraction, by estimation, is 60 to 65%. The  left ventricle has normal function. There is mild left ventricular  hypertrophy.  2. Right ventricular systolic function is mildly reduced. The right  ventricular size is moderately enlarged.  3. Left atrial size was moderately dilated.  4. Right atrial size was moderately dilated.  5. The pericardial effusion is circumferential.  6. The aortic valve was not well visualized.  7. Limited echo in the setting of new onset atrial fibrillation   Assessment:    1. Persistent atrial fibrillation ([place])   2. Coronary artery disease involving native coronary artery of native heart without angina pectoris   3. Orthostatic hypotension   4.  Hyperlipidemia LDL goal <70      Plan:   In order of problems listed above:  1. Persistent Atrial Fibrillation - He was found to be in atrial fibrillation with RVR during his recent admission and his medications were transitioned to Toprol-XL 50 mg daily and Cardizem CD 120 mg daily. By review of records provided from SNF, heart rate has been well controlled and BP stable. Would continue Toprol-XL 50 mg daily and Cardizem CD 120 mg daily for rate control. - He is on Eliquis 5 mg twice daily for anticoagulation. I did mention in his report that was sent back to SNF that ASA can be discontinued from a cardiac perspective given the concurrent use of anticoagulation.  2. CAD - He has a history of prior stenting by review of notes but the details surrounding this are not available.  NST in 02/2020 showed possible scar and a mild ischemic territory with medical management recommended.  - He denies any recent anginal symptoms. Remains on Atorvastatin 20 mg daily and Toprol-XL 50 mg daily. Would recommend stopping ASA given the need for anticoagulation as discussed during his prior admission.  3. Orthostatic Hypotension - BP is stable at 114/60 during today's visit. He does not ambulate so is unaware of any symptoms with positional changes. Remains on Midodrine 10 mg 3 times daily.  4. HLD - FLP in 01/2020 showed total cholesterol 96, triglycerides 45, HDL 44 and LDL 43. Continue Atorvastatin 20 mg daily.    Medication Adjustments/Labs and Tests Ordered: Current medicines are reviewed at length with the patient today.  Concerns regarding medicines are outlined above.  Medication changes, Labs and Tests ordered today are listed in the Patient Instructions  below. Patient Instructions  Medication Instructions:  Your physician recommends that you continue on your current medications as directed. Please refer to the Current Medication list given to you today.  *If you need a refill on your cardiac  medications before your next appointment, please call your pharmacy*   Lab Work: NONE   If you have labs (blood work) drawn today and your tests are completely normal, you will receive your results only by: [name] MyChart Message (if you have MyChart) OR . A paper copy in the mail If you have any lab test that is abnormal or we need to change your treatment, we will call you to review the results.   Testing/Procedures: NONE   Follow-Up: At [facility], you and your health needs are our priority.  As part of our continuing mission to provide you with exceptional heart care, we have created designated Provider Care Teams.  These Care Teams include your primary Cardiologist (physician) and Advanced Practice Providers (APPs -  Physician Assistants and Nurse Practitioners) who all work together to provide you with the care you need, when you need it.  We recommend signing up for the patient portal called "MyChart".  Sign up information is provided on this After Visit Summary.  MyChart is used to connect with patients for Virtual Visits (Telemedicine).  Patients are able to view lab/test results, encounter notes, upcoming appointments, etc.  Non-urgent messages can be sent to your provider as well.   To learn more about what you can do with MyChart, go to [email or web address].    Your next appointment:   6 month(s)  The format for your next appointment:   In Person  Provider:   Carlyle Dolly, MD   Other Instructions Thank you for choosing [facility]!    Signed, Erma Heritage, PA-C  09/11/2020 3:05 PM    [place] [address] [place], [place] [address] Phone: [phone] Fax: [phone]

## 2020-09-14 ENCOUNTER — Other Ambulatory Visit (HOSPITAL_COMMUNITY)
Admission: RE | Admit: 2020-09-14 | Discharge: 2020-09-14 | Disposition: A | Discharge status: Home / Self Care | Payer: Medicare Other | Source: Skilled Nursing Facility | Attending: Adult Health | Admitting: Adult Health

## 2020-09-14 DIAGNOSIS — E876 Hypokalemia: Secondary | ICD-10-CM | POA: Insufficient documentation

## 2020-09-14 LAB — POTASSIUM: Potassium: 4.2 mmol/L (ref 3.5–5.1)

## 2020-09-18 ENCOUNTER — Non-Acute Institutional Stay (INDEPENDENT_AMBULATORY_CARE_PROVIDER_SITE_OTHER): Payer: Medicare Other | Admitting: Adult Health

## 2020-09-18 ENCOUNTER — Encounter: Payer: Self-pay | Admitting: Adult Health

## 2020-09-18 DIAGNOSIS — Z0000 Encounter for general adult medical examination without abnormal findings: Secondary | ICD-10-CM | POA: Diagnosis not present

## 2020-09-18 NOTE — Progress Notes (Signed)
Subjective:   Eric Bowman is a 86 y.o. male who presents for Medicare Annual/Subsequent preventive examination.  Review of Systems    Review of Systems  Constitutional: Negative for malaise/fatigue.  Respiratory: Negative for cough and shortness of breath.   Cardiovascular: Negative for chest pain, palpitations and leg swelling.  Gastrointestinal: Negative for abdominal pain, constipation and heartburn.  Musculoskeletal: Negative for back pain, joint pain and myalgias.  Skin: Negative.   Neurological: Negative for dizziness.  Psychiatric/Behavioral: The patient is not nervous/anxious.     Cardiac Risk Factors include: advanced age (>54men, >80 women);sedentary lifestyle;hypertension     Objective:    Today's Vitals   09/18/20 0936  BP: 108/80  Pulse: 60  Temp: (!) 97 F (36.1 C)  SpO2: 95%  Weight: 184 lb 9.6 oz (83.7 kg)  Height: 5\' 9"  (1.753 m)   Body mass index is 27.26 kg/m.  Advanced Directives 09/01/2020 08/26/2020 08/24/2020 08/21/2020 08/20/2020 08/17/2020 08/12/2020  Does Patient Have a Medical Advance Directive? Yes Unable to assess, patient is non-responsive or altered mental status Yes Yes Yes Yes Yes  Type of Advance Directive [place];Living will;Out of facility DNR (pink MOST or yellow form) - [place];Living will;Out of facility DNR (pink MOST or yellow form) [place];Out of facility DNR (pink MOST or yellow form) [place];Living will;Out of facility DNR (pink MOST or yellow form) [place];Living will;Out of facility DNR (pink MOST or yellow form) [place];Living will;Out of facility DNR (pink MOST or yellow form)  Does patient want to make changes to medical advance directive? No - Patient declined - No - Patient declined No - Patient declined No - Patient declined No - Patient declined No - Patient declined  Copy of [place] in Chart? Yes - validated most recent copy scanned in chart (See row information) - Yes - validated most recent copy scanned in chart (See row information) Yes - validated most recent copy scanned in chart (See row information) Yes - validated most recent copy scanned in chart (See row information) Yes - validated most recent copy scanned in chart (See row information) Yes - validated most recent copy scanned in chart (See row information)  Would patient like information on creating a medical advance directive? - - - - - - -  Pre-existing out of facility DNR order (yellow form or pink MOST form) Yellow form placed in chart (order not valid for inpatient use) - Yellow form placed in chart (order not valid for inpatient use) Yellow form placed in chart (order not valid for inpatient use) Yellow form placed in chart (order not valid for inpatient use) Yellow form placed in chart (order not valid for inpatient use) Yellow form placed in chart (order not valid for inpatient use)    Current Medications (verified) Outpatient Encounter Medications as of 09/18/2020  Medication Sig  . acetaminophen (TYLENOL) 500 MG tablet Take 500 mg by mouth every 6 (six) hours as needed. For pain   . allopurinol (ZYLOPRIM) 300 MG tablet Take 300 mg by mouth daily.    . Amino Acids-Protein Hydrolys (FEEDING SUPPLEMENT, PRO-STAT SUGAR FREE 64,) LIQD Take 30 mLs by mouth in the morning and at bedtime.  [name] apixaban (ELIQUIS) 5 MG TABS tablet Take 5 mg by mouth 2 (two) times daily.  [name] (LIPITOR) 20 MG tablet Take 20 mg by mouth every evening.  Roseanne Kaufman [place]-Castor Oil (VENELEX)  OINT Special Instructions: Apply to sacrum/coccyx and bilateral buttocks qshift prn erythema.  . cetirizine (ZYRTEC) 10 MG tablet Take 10 mg by mouth at bedtime.  [name] diltiazem (CARDIZEM CD) 120 MG 24 hr capsule Take 1 capsule (120 mg total) by mouth daily.  [name] donepezil (ARICEPT) 10 MG tablet Take 10 mg by mouth at bedtime.   .  gabapentin (NEURONTIN) 100 MG capsule Take 100 mg by mouth at bedtime.  [name] ipratropium-albuterol (DUONEB) 0.5-2.5 (3) MG/3ML SOLN Take 3 mLs by nebulization every 6 (six) hours as needed.  . metoprolol succinate (TOPROL-XL) 50 MG 24 hr tablet Take 1 tablet (50 mg total) by mouth daily.  . midodrine (PROAMATINE) 10 MG tablet Take 1 tablet (10 mg total) by mouth 3 (three) times daily with meals.  . NON FORMULARY Pureed NAS diet with nectar thick liquids  . OXYGEN Inhale 2 L into the lungs continuous. for shortness of breath to keep 02 sat above 90%  . pantoprazole (PROTONIX) 40 MG tablet Take 1 tablet (40 mg total) by mouth daily.  . potassium chloride SA (KLOR-CON) 20 MEQ tablet Take 40 mEq by mouth 2 (two) times daily.   [name] torsemide (DEMADEX) 20 MG tablet Take 20 mg by mouth 2 (two) times daily. For Edema  . Vitamins A & D (VITAMIN A & D) ointment Apply A&D ointment to bilateral heels qshift. Every Shift Day, Evening, Night  . [DISCONTINUED] aspirin EC 81 MG tablet Take 1 tablet (81 mg total) by mouth daily with breakfast. Swallow whole.   No facility-administered encounter medications on file as of 09/18/2020.    Allergies (verified) Other; Penicillin g; Sulfonic acid (3,5-dibromo-4-h-ox-benz); Penicillins; Sulfonamide derivatives; and Tetanus toxoid   History: Past Medical History:  Diagnosis Date  . Cancer (HCC)    melanoma-left side  . Chronic pain    legs and feet  . Coronary artery disease   . Dementia ([address])   . Diabetes mellitus without complication ([place])   . Foot drop   . GERD (gastroesophageal reflux disease)   . Gout   . Hyperlipidemia   . Hypertension   . OCD (obsessive compulsive disorder)   . PONV (postoperative nausea and vomiting)   . Psychosis ([place])   . Sarcoidosis   . Sleep apnea    cpap   Past Surgical History:  Procedure Laterality Date  . BACK SURGERY     cervical neck  . CATARACT EXTRACTION W/PHACO  01/05/2012   Procedure: CATARACT EXTRACTION PHACO AND  INTRAOCULAR LENS PLACEMENT (IOC);  Surgeon: [name] Branch, MD;  Location: [facility];  Service: Ophthalmology;  Laterality: Left;  CDE 18.47  . CATARACT EXTRACTION W/PHACO  02/02/2012   Procedure: CATARACT EXTRACTION PHACO AND INTRAOCULAR LENS PLACEMENT (IOC);  Surgeon: [name] Branch, MD;  Location: [facility];  Service: Ophthalmology;  Laterality: Right;  CDE:15.38  . CHOLECYSTECTOMY    . hemorhoidectomy    . MELANOMA EXCISION     left side-[name]  . TONSILLECTOMY     Family History  Problem Relation Age of Onset  . Heart disease Other   . Arthritis Other   . Cancer Other   . Anesthesia problems Neg Hx   . Hypotension Neg Hx   . Malignant hyperthermia Neg Hx   . Pseudochol deficiency Neg Hx    Social History   Socioeconomic History  . Marital status: Married    Spouse name: Not on file  . Number of children: Not on file  . Years of education: Not on file  .  Highest education level: Not on file  Occupational History  . Occupation: retired   Tobacco Use  . Smoking status: Former Smoker    Packs/day: 0.25    Years: 20.00    Pack years: 5.00    Types: Pipe    Quit date: 01/02/1974    Years since quitting: 46.7  . Smokeless tobacco: Never Used  Vaping Use  . Vaping Use: Never used  Substance and Sexual Activity  . Alcohol use: No  . Drug use: No  . Sexual activity: Not Currently  Other Topics Concern  . Not on file  Social History Narrative   Former pipe smoker, quit many years ago.   Long term resident of [facility]      Social Determinants of Health   Financial Resource Strain:   . Difficulty of Paying Living Expenses: Not on file  Food Insecurity:   . Worried About Charity fundraiser in the Last Year: Not on file  . Ran Out of Food in the Last Year: Not on file  Transportation Needs:   . Lack of Transportation (Medical): Not on file  . Lack of Transportation (Non-Medical): Not on file  Physical Activity:   . Days of Exercise per Week: Not on file  . Minutes of Exercise per  Session: Not on file  Stress:   . Feeling of Stress : Not on file  Social Connections:   . Frequency of Communication with Friends and Family: Not on file  . Frequency of Social Gatherings with Friends and Family: Not on file  . Attends Religious Services: Not on file  . Active Member of Clubs or Organizations: Not on file  . Attends Archivist Meetings: Not on file  . Marital Status: Not on file    Tobacco Counseling Counseling given: Not Answered   Clinical Intake:  Pre-visit preparation completed: Yes  Pain : No/denies pain     BMI - recorded: 27.26 Nutritional Status: BMI 25 -29 Overweight Diabetes: No  How often do you need to have someone help you when you read instructions, pamphlets, or other written materials from your doctor or pharmacy?: 5 - Always  Diabetic?no  Interpreter Needed?: No      Activities of Daily Living In your present state of health, do you have any difficulty performing the following activities: 09/18/2020 08/27/2020  Hearing? N -  Vision? N -  Difficulty concentrating or making decisions? Y -  Walking or climbing stairs? Y -  Dressing or bathing? Y -  Doing errands, shopping? Y N  Preparing Food and eating ? Y -  Using the Toilet? Y -  In the past six months, have you accidently leaked urine? Y -  Do you have problems with loss of bowel control? Y -  Managing your Medications? Y -  Managing your Finances? Y -  Housekeeping or managing your Housekeeping? Y -  Some recent data might be hidden    Patient Care Team: Gerlene Fee, NP as PCP - General (Geriatric Medicine) Harl Bowie Alphonse Guild, MD as PCP - Cardiology ([facility], [place] ([place])  Indicate any recent Medical Services you may have received from other than [facility] providers in the past year (date may be approximate).     Assessment:   This is a routine wellness examination for Shirley.  Hearing/Vision screen No exam data  present  Dietary issues and exercise activities discussed: Current Exercise Habits: The patient has a physically strenuous job, but has no  regular exercise apart from work.  Goals    . Follow up with Provider as scheduled    . General - Client will not be readmitted within 30 days ([facility])      Depression Screen PHQ 2/9 Scores 09/18/2020 09/18/2019  PHQ - 2 Score 0 0    Fall Risk Fall Risk  09/18/2020 09/18/2019  Falls in the past year? 0 1  Number falls in past yr: 0 1  Injury with Fall? 0 1  Risk for fall due to : Impaired balance/gait;Impaired mobility History of fall(s);Impaired balance/gait;Impaired mobility    Any stairs in or around the home? No  If so, are there any without handrails? N/a Home free of loose throw rugs in walkways, pet beds, electrical cords, etc? yes Adequate lighting in your home to reduce risk of falls? Yes  ASSISTIVE DEVICES UTILIZED TO PREVENT FALLS:  Life alert? No  Use of a cane, walker or w/c? Wheelchair Grab bars in the bathroom?yes  Shower chair or bench in shower? Yes  Elevated toilet seat or a handicapped toilet? Yes   TIMED UP AND GO:  Was the test performed? nonambulatory   Cognitive Function: MMSE - Mini Mental State Exam 09/18/2020  Not completed: Unable to complete        Immunizations Immunization History  Administered Date(s) Administered  . Influenza Whole 08/15/2007  . Influenza-Unspecified 07/11/2019, 08/07/2020  . Moderna SARS-COVID-2 Vaccination 01/30/2020, 02/26/2020  . Pneumococcal Conjugate-13 08/05/2019     Qualifies for Shingles Vaccine? N/a   Screening Tests Health Maintenance  Topic Date Due  . PNA vac Low Risk Adult (2 of 2 - PPSV23) 08/04/2020  . INFLUENZA VACCINE  Completed  . COVID-19 Vaccine  Completed  . TETANUS/TDAP  Discontinued    Health Maintenance  Health Maintenance Due  Topic Date Due  . PNA vac Low Risk Adult (2 of 2 - PPSV23) 08/04/2020     Lung Cancer Screening: (Low Dose  CT Chest recommended if Age 58-80 years, 30 pack-year currently smoking OR have quit w/in 15years.) does not  qualify.   Lung Cancer Screening Referral: n/a  Additional Screening:  Hepatitis C Screening: n/a   Vision Screening: Recommended annual ophthalmology exams for early detection of glaucoma and other disorders of the eye. Is the patient up to date with their annual eye exam? yes  Who is the provider or what is the name of the office in which the patient attends annual eye exams? At facility  If pt is not established with a provider, would they like to be referred to a provider to establish care? N/a   Dental Screening: Recommended annual dental exams for proper oral hygiene  Community Resource Referral / Chronic Care Management: CRR required this visit? no  CCM required this visit?  yes     Plan:     I have personally reviewed and noted the following in the patient's chart:   . Medical and social history . Use of alcohol, tobacco or illicit drugs  . Current medications and supplements . Functional ability and status . Nutritional status . Physical activity . Advanced directives . List of other physicians . Hospitalizations, surgeries, and ER visits in previous 12 months . Vitals . Screenings to include cognitive, depression, and falls . Referrals and appointments  In addition, I have reviewed and discussed with patient certain preventive protocols, quality metrics, and best practice recommendations. A written personalized care plan for preventive services as well as general preventive health recommendations were provided to  patient.     Gerlene Fee, NP   09/18/2020

## 2020-09-18 NOTE — Patient Instructions (Signed)
  Eric Bowman , Thank you for taking time to come for your Medicare Wellness Visit. I appreciate your ongoing commitment to your health goals. Please review the following plan we discussed and let me know if I can assist you in the future.   These are the goals we discussed: Goals    . Follow up with Provider as scheduled    . General - Client will not be readmitted within 30 days ([facility])       This is a list of the screening recommended for you and due dates:  Health Maintenance  Topic Date Due  . Pneumonia vaccines (2 of 2 - PPSV23) 08/04/2020  . Flu Shot  Completed  . COVID-19 Vaccine  Completed  . Tetanus Vaccine  Discontinued

## 2020-09-21 ENCOUNTER — Ambulatory Visit: Payer: Medicare Other | Admitting: Pulmonary Disease

## 2020-10-01 ENCOUNTER — Encounter: Payer: Self-pay | Admitting: Adult Health

## 2020-10-01 ENCOUNTER — Ambulatory Visit: Payer: Medicare Other | Admitting: Internal Medicine

## 2020-10-01 ENCOUNTER — Non-Acute Institutional Stay (SKILLED_NURSING_FACILITY): Payer: Medicare Other | Admitting: Adult Health

## 2020-10-01 DIAGNOSIS — M1A09X0 Idiopathic chronic gout, multiple sites, without tophus (tophi): Secondary | ICD-10-CM

## 2020-10-01 DIAGNOSIS — G4733 Obstructive sleep apnea (adult) (pediatric): Secondary | ICD-10-CM | POA: Diagnosis not present

## 2020-10-01 DIAGNOSIS — I4891 Unspecified atrial fibrillation: Secondary | ICD-10-CM

## 2020-10-01 DIAGNOSIS — Z9989 Dependence on other enabling machines and devices: Secondary | ICD-10-CM | POA: Diagnosis not present

## 2020-10-01 NOTE — Progress Notes (Signed)
Location:    [facility] Room Number: 127/D Place of Service:  SNF (31)   CODE STATUS: DNR  Allergies  Allergen Reactions  . Other   . Penicillin G   . Sulfonic Acid (3,5-Dibromo-4-H-Ox-Benz)   . Penicillins Rash  . Sulfonamide Derivatives Rash  . Tetanus Toxoid Rash    Chief Complaint  Patient presents with  . 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 There are no reports of uncontrolled pain; no reports of cough; shortness of breath. No reports of anxiety.   Past Medical History:  Diagnosis Date  . Cancer (HCC)    melanoma-left side  . Chronic pain    legs and feet  . Coronary artery disease   . Dementia ([place])   . Diabetes mellitus without complication ([place])   . Foot drop   . GERD (gastroesophageal reflux disease)   . Gout   . Hyperlipidemia   . Hypertension   . OCD (obsessive compulsive disorder)   . PONV (postoperative nausea and vomiting)   . Psychosis ([place])   . Sarcoidosis   . Sleep apnea    cpap    Past Surgical History:  Procedure Laterality Date  . BACK SURGERY     cervical neck  . CATARACT EXTRACTION W/PHACO  01/05/2012   Procedure: CATARACT EXTRACTION PHACO AND INTRAOCULAR LENS PLACEMENT (IOC);  Surgeon: [name] Branch, MD;  Location: [facility];  Service: Ophthalmology;  Laterality: Left;  CDE 18.47  . CATARACT EXTRACTION W/PHACO  02/02/2012   Procedure: CATARACT EXTRACTION PHACO AND INTRAOCULAR LENS PLACEMENT (IOC);  Surgeon: [name] Branch, MD;  Location: [facility];  Service: Ophthalmology;  Laterality: Right;  CDE:15.38  . CHOLECYSTECTOMY    .  hemorhoidectomy    . MELANOMA EXCISION     left side-[name]  . TONSILLECTOMY      Social History   Socioeconomic History  . Marital status: Married    Spouse name: Not on file  . Number of children: Not on file  . Years of education: Not on file  . Highest education level: Not on file  Occupational History  . Occupation: retired   Tobacco Use  . Smoking status: Former Smoker    Packs/day: 0.25    Years: 20.00    Pack years: 5.00    Types: Pipe    Quit date: 01/02/1974    Years since quitting: 46.7  . Smokeless tobacco: Never Used  Vaping Use  . Vaping Use: Never used  Substance and Sexual Activity  . Alcohol use: No  . Drug use: No  . Sexual activity: Not Currently  Other Topics Concern  . Not on file  Social History Narrative   Former pipe smoker, quit many years ago.   Long term resident of [facility]      Social Determinants of Health   Financial Resource Strain:   . Difficulty of Paying Living Expenses: Not on file  Food Insecurity:   . Worried About Charity fundraiser in the Last Year: Not on file  . Ran Out of  Food in the Last Year: Not on file  Transportation Needs:   . Lack of Transportation (Medical): Not on file  . Lack of Transportation (Non-Medical): Not on file  Physical Activity:   . Days of Exercise per Week: Not on file  . Minutes of Exercise per Session: Not on file  Stress:   . Feeling of Stress : Not on file  Social Connections:   . Frequency of Communication with Friends and Family: Not on file  . Frequency of Social Gatherings with Friends and Family: Not on file  . Attends Religious Services: Not on file  . Active Member of Clubs or Organizations: Not on file  . Attends Archivist Meetings: Not on file  . Marital Status: Not on file  Intimate Partner Violence:   . Fear of Current or Ex-Partner: Not on file  . Emotionally Abused: Not on file  . Physically Abused: Not on file  . Sexually Abused: Not on file   Family History   Problem Relation Age of Onset  . Heart disease Other   . Arthritis Other   . Cancer Other   . Anesthesia problems Neg Hx   . Hypotension Neg Hx   . Malignant hyperthermia Neg Hx   . Pseudochol deficiency Neg Hx       VITAL SIGNS BP 117/72   Pulse 100   Temp 98.2 F (36.8 C)   Resp 18   Ht 5\' 9"  (1.753 m)   Wt 184 lb 9.6 oz (83.7 kg)   SpO2 93%   BMI 27.26 kg/m   Outpatient Encounter Medications as of 10/01/2020  Medication Sig  . acetaminophen (TYLENOL) 500 MG tablet Take 500 mg by mouth every 6 (six) hours as needed. For pain   . allopurinol (ZYLOPRIM) 300 MG tablet Take 300 mg by mouth daily.    . Amino Acids-Protein Hydrolys (FEEDING SUPPLEMENT, PRO-STAT SUGAR FREE 64,) LIQD Take 30 mLs by mouth in the morning and at bedtime.  [name] apixaban (ELIQUIS) 5 MG TABS tablet Take 5 mg by mouth 2 (two) times daily.  [name] (LIPITOR) 20 MG tablet Take 20 mg by mouth every evening.  Roseanne Kaufman [facility]) OINT Special Instructions: Apply to sacrum/coccyx and bilateral buttocks qshift prn erythema.  . cetirizine (ZYRTEC) 10 MG tablet Take 10 mg by mouth at bedtime.  [name] diltiazem (CARDIZEM CD) 120 MG 24 hr capsule Take 1 capsule (120 mg total) by mouth daily.  [name] donepezil (ARICEPT) 10 MG tablet Take 10 mg by mouth at bedtime.   . gabapentin (NEURONTIN) 100 MG capsule Take 100 mg by mouth at bedtime.  [name] ipratropium-albuterol (DUONEB) 0.5-2.5 (3) MG/3ML SOLN Take 3 mLs by nebulization every 6 (six) hours as needed.  . metoprolol succinate (TOPROL-XL) 50 MG 24 hr tablet Take 1 tablet (50 mg total) by mouth daily.  . midodrine (PROAMATINE) 10 MG tablet Take 1 tablet (10 mg total) by mouth 3 (three) times daily with meals.  . NON FORMULARY Pureed NAS diet with nectar thick liquids  . OXYGEN Inhale 2 L into the lungs continuous. for shortness of breath to keep 02 sat above 90%  . pantoprazole (PROTONIX) 40 MG tablet Take 1 tablet (40 mg total) by mouth daily.  . potassium  chloride SA (KLOR-CON) 20 MEQ tablet Take 40 mEq by mouth 2 (two) times daily.   [name] torsemide (DEMADEX) 20 MG tablet Take 20 mg by mouth 2 (two) times daily. For Edema  . Vitamins A &  D (VITAMIN A & D) ointment Apply A&D ointment to bilateral heels qshift. Every Shift Day, Evening, Night   No facility-administered encounter medications on file as of 10/01/2020.     SIGNIFICANT DIAGNOSTIC EXAMS  PREVIOUS;   12-17-19: CT of head and cervical spine:  1. No acute intracranial pathology. Small-vessel white matter disease. 2. No fracture or static subluxation of the cervical spine. 3. Anterior cervical discectomy and fusion of C4 through C6, with incorporation of the disc spaces and bony ankylosis of the included levels inferior to this through T3. 4. Prominent osteophytes, disc calcifications and calcifications of the ligamentum flavum, which appear to significantly narrow the cervical canal at C3-C4, minimum AP diameter approximately 4 mm.  02-13-20: MRI lumbar spine:  1. L2 fracture involving the anterior wall and superior endplate, likely subacute. The appearance suggest a hyperextension mechanism. No height loss. 2. L3-L4 severe spinal canal stenosis with severe right and moderate left neural foraminal stenosis. 3. T12-L1 moderate spinal canal stenosis and severe bilateral neural foraminal stenosis. 4. L1-L2 and L2-L3 severe right neural foraminal stenosis. 5. L5-S1 moderate bilateral neural foraminal stenosis.   02-13-20: MRI cervical spine:  1. 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 2. C4-6 ACDF without spinal canal stenosis. 3. Mild bilateral C6-7 neural foraminal stenosis.  03-20-20: Myoview:  No diagnostic ST segment changes to indicate ischemia. Small, moderate intensity, apical septal and apical to basal inferolateral defects. The inferolateral defect is fixed  and consistent with possible scar and the apical septal defect is partially reversible consistent with a mild ischemic territory.This is a high risk study based on calculated LVEF. Would suggest confirmatory echocardiogram. Nuclear stress EF: 15%  04-10-20 2-d echo:  Left ventricular ejection fraction, by estimation, is 55 to 60%. The  left ventricle has normal function. The left ventricle has no regional  wall motion abnormalities. Left ventricular diastolic parameters are  consistent with age-related delayed  relaxation (normal).   07-20-20: chest x-[place]:   Opacification of most of the right hemithorax due to a combination of pleural effusion and consolidation. There is also consolidation in the medial left base. Heart borderline enlarged. There are foci of left carotid artery calcification.  07-27-20 2-d echo:  Left ventricular ejection fraction, by estimation, is 60 to 65%. The  left ventricle has normal function. There is mild left ventricular hypertrophy   07-29-20 chest x-[place]:  1. Cardiomegaly. Pulmonary venous congestion. Bilateral interstitial prominence suggesting interstitial edema and or pneumonitis. Right-sided prominent pleural effusion again noted, decreased in size from prior chest x-[place]. 2.  Carotid vascular disease  07-28-20: right thoracentesis: Successful ultrasound guided RIGHT thoracentesis yielding 1280 mL of pleural fluid.  07-28-20: chest x-[place]:  No pneumothorax following thoracentesis. Residual bibasilar pleural effusions and atelectasis.  07-31-20: swallow study: Recommend continue with D1/puree diet and NECTAR THICK liquids with meds crushed in puree.   08-10-20: chest x-[place]: chf with mild to moderate right effusion seen. Mild interval progression noted.   08-20-20: chest x-[place]i: Bilateral pleural effusions right greater than left increased from the prior exam  08-26-20: chest x-[place]:  1. Stable compared to 6 days ago. 2. Moderate right pleural effusion.  08-27-20:  right thoracentesis: A total of approximately 580 mL of slightly cloudy yellow RIGHT pleural fluid was removed.  NO NEW EXAMS.   LABS REVIEWED PREVIOUS;    10-05-19: wbc 4.8; hgb 11.8; hct 36.2; mcv 89.8 plt 256; glucose 100; bun  18; creat 0.53 ;k+ 3.9; an++ 128; ca 8.6; d-dimer: 3.36 ferritin 965 10-16-19: glucose 96; bun 14; creat 0.47; k+ 3.7; na++ 134; ca 8.5 12-02-19: wbc 5.9; hgb 12.4; hct 38.9; mcv 91.1 plt 227; chol 96; ldl 43; trig 45; hdl 44  01-30-20: chol 96; ldl 43; trig 45; hdl 44  06-11-20: wbc 5.5; hgb 12.5; hct 39.0 mcv 92.6 plt 184; glucose 99; bun 26; creat 0.68; k+ 4.6; na++ 131; ca 8.9 liver normal albumin 3.5 hgb a1c 5.5; tsh 2.617 07-20-20: wbc 5.1; hgb 13.0; hct 41.2; mcv 90.7 plt 176; glucose 110; bun 25; creat 0.52; k+ 4.5; na++ 129; ca 8.7  07-21-20: wbc 5.7; hgb 13.5; hct 42.0; mcv 91.1 plt 171 glucose 106; bun 21; creat 0.58; k+ 4.1; na++ 132; ca 9.0 07-23-20; glucose 110; bun 20; creat 0.62; k+ 3.4; na++ 133; ca 8.7 liver normal albumin 3.2  07-27-20: wbc 8.4; hgb 13.6; hct 41.4; mcv 90.6 plt 133; glucose 104; bun 29; creat 0.88; k+ 3.5; na++ 138; ca 8.4 liver normal albumin 3.4 tsh 2.324 blood and urine culture: no growth 07-29-20: wbc 8.1; hgb 13.0; hct 42.1; mcv 91.9 plt 145; glucose 101; bun 27; creat 0.81; k+ 3.5; na++ 139; ca 8.4 mag 1.8 08-01-20: wbc 7.4; hgb 14.3; hct 45.8; mcv 91.6 plt 169; glucose 89; bun 24; creat 0.74; k+ 4.7; na++ 140; ca 8.5 mag 1.8  08-13-20: BNP 432.0 08-20-20: glucose 113; bun 81; creat 1.46; k+ 4.2; na++ 150; ca 9.6 08-21-20: glucose 103; bun 66; creat 1.19 k+ 3.8; na++ 147; ca 8.8  08-24-20: glucose 108; bun 43; creat 0.98; k+ 3.6; na++ 144; ca 9.2  08-26-20: wbc 7.4; hgb 14.4; hct 46.5; mcv 92.3 plt 255; glucose 94; bun 33; creat 0.91; k+ 3.8; na++ 144; ca 9.5 liver normal albumin 3.3 08-27-20: wbc 6.5; hgb 14.1; hct 45.7; mcv 92.7 plt 212; glucose 89; bun 26; creat 0.74; k+ 3.4; na++ 142; ca 8.9 liver normal albumin 3.1 LDH 131  NO NEW  LABS.   Review of Systems  Constitutional: Negative for malaise/fatigue.  Respiratory: Negative for cough and shortness of breath.   Cardiovascular: Negative for chest pain, palpitations and leg swelling.  Gastrointestinal: Negative for abdominal pain, constipation and heartburn.  Musculoskeletal: Negative for back pain, joint pain and myalgias.  Skin: Negative.   Neurological: Negative for dizziness.  Psychiatric/Behavioral: The patient is not nervous/anxious.       Physical Exam Constitutional:      General: He is not in acute distress.    Appearance: He is well-developed. He is not diaphoretic.  Eyes:     Comments: Bilateral cataract with lens implants  Neck:     Thyroid: No thyromegaly.  Cardiovascular:     Rate and Rhythm: Normal rate and regular rhythm.     Pulses: Normal pulses.     Heart sounds: Normal heart sounds.  Pulmonary:     Effort: Pulmonary effort is normal. No respiratory distress.     Breath sounds: Normal breath sounds.  Abdominal:     General: Bowel sounds are normal. There is no distension.     Palpations: Abdomen is soft.     Tenderness: There is no abdominal tenderness.  Musculoskeletal:     Cervical back: Neck supple.     Right lower leg: No edema.     Left lower leg: No edema.     Comments:  Is able to move all extremities   Lymphadenopathy:     Cervical: No cervical adenopathy.  Skin:    General: Skin is warm and dry.  Neurological:     Mental Status: He is alert. Mental status is at baseline.  Psychiatric:        Mood and Affect: Mood normal.     ASSESSMENT/ PLAN:  TODAY  1. Atrial fibrillation with rapid ventricular response: heart rate is stable will continue toprol xl 50 mg daily for rate control and eliquis 5 mg twice daily   2. Sleep apnea obstructive: is without change no longer on CPAP due to declining this therapy.   3. 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 History of melanoma is stable history of excision of left flank  5. Coronary artery disease of native artery of native heart with stable angina: is stable will continue toprol xl 50 mg daily   6. Hypotension due to drugs: is stable b/p 117/72 will continue midodrine 10 mg three times daily   7. Right pleural effusion: status post thoracentesis 07-28-20. Is still present on x-[place] 08-10-20    8. GERD without esophagitis: is stable will continue protonix 40 mg daily   9. Vascular dementia without behavioral disturbance: is without change weight is 202 pounds; will continue aricept 10 mg daily  10. Chronic non-seasonal allergic rhinitis: will continue zyrtec 10 mg daily   11. Hypertensive heart disease with chronic diastolic congestive heart failure: is stable b/p 117/72 will continue toprol xl 50 mg daily   12. Radiculopathy of cervical spine: is stable will continue gabapentin 100 mg daily mobic has been stopped.   13. Dyslipidemia is stable LDL 43 will continue lipitor 20 mg daily   14. Right pleural effusion is recurrent s/p thoracentesis 07-28-20 and repeated on 08-27-20 for 580 mL.  15. COPD with hypoxia/history of sarcoidosis: is stable will continue duoneb every 6 hours as needed   16. Chronic diastolic congestive heart failure: is stable EF 60-65% (07-27-20): will continue demadex 20 mg twice daily with k+ 20 meq daily      MD is aware of resident's narcotic use and is in agreement with current plan of care. We will attempt to wean resident as appropriate.  Ok Edwards NP [facility]  Contact [phone] Monday through Friday 8am- 5pm  After hours call [phone]

## 2020-10-02 ENCOUNTER — Other Ambulatory Visit: Payer: Self-pay

## 2020-10-02 ENCOUNTER — Encounter: Payer: Self-pay | Admitting: Adult Health

## 2020-10-02 ENCOUNTER — Encounter (HOSPITAL_COMMUNITY): Payer: Self-pay

## 2020-10-02 ENCOUNTER — Inpatient Hospital Stay (HOSPITAL_COMMUNITY)
Admission: EM | Admit: 2020-10-02 | Discharge: 2020-10-09 | DRG: 871 | Disposition: A | Discharge status: Hospice | Payer: Medicare Other | Source: Skilled Nursing Facility | Attending: Internal Medicine | Admitting: Internal Medicine

## 2020-10-02 ENCOUNTER — Ambulatory Visit (HOSPITAL_COMMUNITY): Payer: Medicare Other

## 2020-10-02 ENCOUNTER — Encounter (HOSPITAL_COMMUNITY)
Admission: RE | Admit: 2020-10-02 | Discharge: 2020-10-02 | Disposition: A | Discharge status: Home / Self Care | Payer: Medicare Other | Source: Skilled Nursing Facility | Attending: Adult Health | Admitting: Adult Health

## 2020-10-02 ENCOUNTER — Non-Acute Institutional Stay (SKILLED_NURSING_FACILITY): Payer: Medicare Other | Admitting: Adult Health

## 2020-10-02 DIAGNOSIS — I313 Pericardial effusion (noninflammatory): Secondary | ICD-10-CM | POA: Diagnosis present

## 2020-10-02 DIAGNOSIS — J918 Pleural effusion in other conditions classified elsewhere: Secondary | ICD-10-CM | POA: Insufficient documentation

## 2020-10-02 DIAGNOSIS — J9611 Chronic respiratory failure with hypoxia: Secondary | ICD-10-CM | POA: Diagnosis present

## 2020-10-02 DIAGNOSIS — M1A09X0 Idiopathic chronic gout, multiple sites, without tophus (tophi): Secondary | ICD-10-CM

## 2020-10-02 DIAGNOSIS — Z20822 Contact with and (suspected) exposure to covid-19: Secondary | ICD-10-CM | POA: Diagnosis present

## 2020-10-02 DIAGNOSIS — N179 Acute kidney failure, unspecified: Secondary | ICD-10-CM | POA: Diagnosis present

## 2020-10-02 DIAGNOSIS — Z9981 Dependence on supplemental oxygen: Secondary | ICD-10-CM

## 2020-10-02 DIAGNOSIS — J3089 Other allergic rhinitis: Secondary | ICD-10-CM | POA: Diagnosis present

## 2020-10-02 DIAGNOSIS — D869 Sarcoidosis, unspecified: Secondary | ICD-10-CM | POA: Diagnosis present

## 2020-10-02 DIAGNOSIS — J189 Pneumonia, unspecified organism: Secondary | ICD-10-CM | POA: Diagnosis present

## 2020-10-02 DIAGNOSIS — I10 Essential (primary) hypertension: Secondary | ICD-10-CM | POA: Insufficient documentation

## 2020-10-02 DIAGNOSIS — I4891 Unspecified atrial fibrillation: Secondary | ICD-10-CM | POA: Diagnosis not present

## 2020-10-02 DIAGNOSIS — G8929 Other chronic pain: Secondary | ICD-10-CM | POA: Diagnosis present

## 2020-10-02 DIAGNOSIS — J9811 Atelectasis: Secondary | ICD-10-CM | POA: Diagnosis present

## 2020-10-02 DIAGNOSIS — E860 Dehydration: Secondary | ICD-10-CM | POA: Diagnosis present

## 2020-10-02 DIAGNOSIS — Z66 Do not resuscitate: Secondary | ICD-10-CM | POA: Diagnosis present

## 2020-10-02 DIAGNOSIS — I959 Hypotension, unspecified: Secondary | ICD-10-CM | POA: Diagnosis present

## 2020-10-02 DIAGNOSIS — R1310 Dysphagia, unspecified: Secondary | ICD-10-CM

## 2020-10-02 DIAGNOSIS — F0150 Vascular dementia without behavioral disturbance: Secondary | ICD-10-CM

## 2020-10-02 DIAGNOSIS — Z880 Allergy status to penicillin: Secondary | ICD-10-CM

## 2020-10-02 DIAGNOSIS — E875 Hyperkalemia: Secondary | ICD-10-CM

## 2020-10-02 DIAGNOSIS — K219 Gastro-esophageal reflux disease without esophagitis: Secondary | ICD-10-CM | POA: Diagnosis present

## 2020-10-02 DIAGNOSIS — E872 Acidosis, unspecified: Secondary | ICD-10-CM | POA: Diagnosis present

## 2020-10-02 DIAGNOSIS — H9190 Unspecified hearing loss, unspecified ear: Secondary | ICD-10-CM | POA: Diagnosis present

## 2020-10-02 DIAGNOSIS — F429 Obsessive-compulsive disorder, unspecified: Secondary | ICD-10-CM | POA: Diagnosis present

## 2020-10-02 DIAGNOSIS — E870 Hyperosmolality and hypernatremia: Secondary | ICD-10-CM

## 2020-10-02 DIAGNOSIS — J90 Pleural effusion, not elsewhere classified: Secondary | ICD-10-CM

## 2020-10-02 DIAGNOSIS — Z9049 Acquired absence of other specified parts of digestive tract: Secondary | ICD-10-CM

## 2020-10-02 DIAGNOSIS — M1A9XX0 Chronic gout, unspecified, without tophus (tophi): Secondary | ICD-10-CM | POA: Diagnosis present

## 2020-10-02 DIAGNOSIS — E119 Type 2 diabetes mellitus without complications: Secondary | ICD-10-CM | POA: Diagnosis present

## 2020-10-02 DIAGNOSIS — K769 Liver disease, unspecified: Secondary | ICD-10-CM | POA: Insufficient documentation

## 2020-10-02 DIAGNOSIS — E785 Hyperlipidemia, unspecified: Secondary | ICD-10-CM

## 2020-10-02 DIAGNOSIS — Z882 Allergy status to sulfonamides status: Secondary | ICD-10-CM

## 2020-10-02 DIAGNOSIS — Z515 Encounter for palliative care: Secondary | ICD-10-CM | POA: Diagnosis not present

## 2020-10-02 DIAGNOSIS — J449 Chronic obstructive pulmonary disease, unspecified: Secondary | ICD-10-CM | POA: Diagnosis present

## 2020-10-02 DIAGNOSIS — A419 Sepsis, unspecified organism: Secondary | ICD-10-CM | POA: Diagnosis present

## 2020-10-02 DIAGNOSIS — R296 Repeated falls: Secondary | ICD-10-CM | POA: Diagnosis present

## 2020-10-02 DIAGNOSIS — E876 Hypokalemia: Secondary | ICD-10-CM | POA: Diagnosis not present

## 2020-10-02 DIAGNOSIS — G4733 Obstructive sleep apnea (adult) (pediatric): Secondary | ICD-10-CM

## 2020-10-02 DIAGNOSIS — Z7901 Long term (current) use of anticoagulants: Secondary | ICD-10-CM

## 2020-10-02 DIAGNOSIS — R5381 Other malaise: Secondary | ICD-10-CM | POA: Diagnosis present

## 2020-10-02 DIAGNOSIS — Z79899 Other long term (current) drug therapy: Secondary | ICD-10-CM

## 2020-10-02 DIAGNOSIS — Z9109 Other allergy status, other than to drugs and biological substances: Secondary | ICD-10-CM

## 2020-10-02 DIAGNOSIS — I110 Hypertensive heart disease with heart failure: Secondary | ICD-10-CM | POA: Diagnosis not present

## 2020-10-02 DIAGNOSIS — R1312 Dysphagia, oropharyngeal phase: Secondary | ICD-10-CM | POA: Diagnosis not present

## 2020-10-02 DIAGNOSIS — J440 Chronic obstructive pulmonary disease with acute lower respiratory infection: Secondary | ICD-10-CM | POA: Diagnosis present

## 2020-10-02 DIAGNOSIS — R6521 Severe sepsis with septic shock: Secondary | ICD-10-CM | POA: Diagnosis present

## 2020-10-02 DIAGNOSIS — I509 Heart failure, unspecified: Secondary | ICD-10-CM | POA: Diagnosis not present

## 2020-10-02 DIAGNOSIS — I2510 Atherosclerotic heart disease of native coronary artery without angina pectoris: Secondary | ICD-10-CM | POA: Diagnosis present

## 2020-10-02 DIAGNOSIS — Z7401 Bed confinement status: Secondary | ICD-10-CM

## 2020-10-02 DIAGNOSIS — I5032 Chronic diastolic (congestive) heart failure: Secondary | ICD-10-CM | POA: Diagnosis present

## 2020-10-02 DIAGNOSIS — Z862 Personal history of diseases of the blood and blood-forming organs and certain disorders involving the immune mechanism: Secondary | ICD-10-CM

## 2020-10-02 DIAGNOSIS — Z87891 Personal history of nicotine dependence: Secondary | ICD-10-CM

## 2020-10-02 DIAGNOSIS — I952 Hypotension due to drugs: Secondary | ICD-10-CM

## 2020-10-02 DIAGNOSIS — Z8616 Personal history of COVID-19: Secondary | ICD-10-CM

## 2020-10-02 DIAGNOSIS — Z888 Allergy status to other drugs, medicaments and biological substances status: Secondary | ICD-10-CM

## 2020-10-02 DIAGNOSIS — Z8249 Family history of ischemic heart disease and other diseases of the circulatory system: Secondary | ICD-10-CM

## 2020-10-02 DIAGNOSIS — R748 Abnormal levels of other serum enzymes: Secondary | ICD-10-CM

## 2020-10-02 DIAGNOSIS — R579 Shock, unspecified: Secondary | ICD-10-CM

## 2020-10-02 DIAGNOSIS — Z85820 Personal history of malignant melanoma of skin: Secondary | ICD-10-CM

## 2020-10-02 LAB — COMPREHENSIVE METABOLIC PANEL
ALT: 38 U/L (ref 0–44)
AST: 60 U/L — ABNORMAL HIGH (ref 15–41)
Albumin: 3.8 g/dL (ref 3.5–5.0)
Alkaline Phosphatase: 130 U/L — ABNORMAL HIGH (ref 38–126)
Anion gap: 17 — ABNORMAL HIGH (ref 5–15)
BUN: 105 mg/dL — ABNORMAL HIGH (ref 8–23)
CO2: 26 mmol/L (ref 22–32)
Calcium: 10.2 mg/dL (ref 8.9–10.3)
Chloride: 118 mmol/L — ABNORMAL HIGH (ref 98–111)
Creatinine, Ser: 2.16 mg/dL — ABNORMAL HIGH (ref 0.61–1.24)
GFR, Estimated: 29 mL/min — ABNORMAL LOW (ref >60)
Glucose, Bld: 116 mg/dL — ABNORMAL HIGH (ref 70–99)
Potassium: 6.4 mmol/L (ref 3.5–5.1)
Sodium: 161 mmol/L (ref 135–145)
Total Bilirubin: 1.4 mg/dL — ABNORMAL HIGH (ref 0.3–1.2)
Total Protein: 7.7 g/dL (ref 6.5–8.1)

## 2020-10-02 LAB — RESP PANEL BY RT-PCR (FLU A&B, COVID) ARPGX2
Influenza A by PCR: NEGATIVE
Influenza B by PCR: NEGATIVE
SARS Coronavirus 2 by RT PCR: NEGATIVE

## 2020-10-02 LAB — CBC
HCT: 59.4 % — ABNORMAL HIGH (ref 39.0–52.0)
Hemoglobin: 17.4 g/dL — ABNORMAL HIGH (ref 13.0–17.0)
MCH: 28.7 pg (ref 26.0–34.0)
MCHC: 29.3 g/dL — ABNORMAL LOW (ref 30.0–36.0)
MCV: 98 fL (ref 80.0–100.0)
Platelets: 295 10*3/uL (ref 150–400)
RBC: 6.06 MIL/uL — ABNORMAL HIGH (ref 4.22–5.81)
RDW: 19 % — ABNORMAL HIGH (ref 11.5–15.5)
WBC: 10.6 10*3/uL — ABNORMAL HIGH (ref 4.0–10.5)
nRBC: 0.3 % — ABNORMAL HIGH (ref 0.0–0.2)

## 2020-10-02 LAB — URINALYSIS, ROUTINE W REFLEX MICROSCOPIC
Bilirubin Urine: NEGATIVE
Glucose, UA: NEGATIVE mg/dL
Hgb urine dipstick: NEGATIVE
Ketones, ur: NEGATIVE mg/dL
Leukocytes,Ua: NEGATIVE
Nitrite: NEGATIVE
Protein, ur: NEGATIVE mg/dL
Specific Gravity, Urine: 1.012 (ref 1.005–1.030)
pH: 5 (ref 5.0–8.0)

## 2020-10-02 LAB — MRSA PCR SCREENING: MRSA by PCR: NEGATIVE

## 2020-10-02 LAB — PROCALCITONIN: Procalcitonin: 0.11 ng/mL

## 2020-10-02 LAB — POTASSIUM: Potassium: 6 mmol/L — ABNORMAL HIGH (ref 3.5–5.1)

## 2020-10-02 LAB — BRAIN NATRIURETIC PEPTIDE: B Natriuretic Peptide: 726 pg/mL — ABNORMAL HIGH (ref 0.0–100.0)

## 2020-10-02 LAB — PROTIME-INR
INR: 2.9 — ABNORMAL HIGH (ref 0.8–1.2)
Prothrombin Time: 29.7 seconds — ABNORMAL HIGH (ref 11.4–15.2)

## 2020-10-02 LAB — LACTIC ACID, PLASMA
Lactic Acid, Venous: 3.9 mmol/L (ref 0.5–1.9)
Lactic Acid, Venous: 3.8 mmol/L (ref 0.5–1.9)
Lactic Acid, Venous: 3.8 mmol/L (ref 0.5–1.9)

## 2020-10-02 LAB — APTT: aPTT: 31 seconds (ref 24–36)

## 2020-10-02 MED ORDER — LACTATED RINGERS IV SOLN: INTRAVENOUS | Status: DC

## 2020-10-02 MED ORDER — LACTATED RINGERS IV BOLUS
1000.0000 mL | Freq: Once | INTRAVENOUS | Status: AC
  Administered 2020-10-02: 1000 mL via INTRAVENOUS

## 2020-10-02 MED ORDER — ACETAMINOPHEN 325 MG PO TABS
650.0000 mg | ORAL_TABLET | Freq: Four times a day (QID) | ORAL | Status: DC | PRN
  Administered 2020-10-02: 650 mg via ORAL
  Filled 2020-10-02: qty 2

## 2020-10-02 MED ORDER — SODIUM CHLORIDE 0.9 % IV BOLUS
1000.0000 mL | Freq: Once | INTRAVENOUS | Status: AC
  Administered 2020-10-02: 1000 mL via INTRAVENOUS

## 2020-10-02 MED ORDER — APIXABAN 2.5 MG PO TABS
2.5000 mg | ORAL_TABLET | Freq: Two times a day (BID) | ORAL | Status: DC
  Administered 2020-10-02 – 2020-10-05 (×6): 2.5 mg via ORAL
  Filled 2020-10-02 (×6): qty 1

## 2020-10-02 MED ORDER — APIXABAN 5 MG PO TABS: 5.0000 mg | ORAL_TABLET | Freq: Two times a day (BID) | ORAL | Status: DC

## 2020-10-02 MED ORDER — TRAZODONE HCL 50 MG PO TABS: 25.0000 mg | ORAL_TABLET | Freq: Every evening | ORAL | Status: DC | PRN

## 2020-10-02 MED ORDER — ATORVASTATIN CALCIUM 10 MG PO TABS
20.0000 mg | ORAL_TABLET | Freq: Every evening | ORAL | Status: DC
  Administered 2020-10-02 – 2020-10-07 (×6): 20 mg via ORAL
  Filled 2020-10-02 (×6): qty 2

## 2020-10-02 MED ORDER — PANTOPRAZOLE SODIUM 40 MG IV SOLR
40.0000 mg | INTRAVENOUS | Status: DC
  Administered 2020-10-02 – 2020-10-08 (×7): 40 mg via INTRAVENOUS
  Filled 2020-10-02 (×7): qty 40

## 2020-10-02 MED ORDER — CHLORHEXIDINE GLUCONATE CLOTH 2 % EX PADS
6.0000 | MEDICATED_PAD | Freq: Every day | CUTANEOUS | Status: DC
  Administered 2020-10-02 – 2020-10-09 (×7): 6 via TOPICAL

## 2020-10-02 MED ORDER — DEXTROSE 5 % IV SOLN: INTRAVENOUS | Status: DC

## 2020-10-02 MED ORDER — IPRATROPIUM-ALBUTEROL 0.5-2.5 (3) MG/3ML IN SOLN: 3.0000 mL | Freq: Four times a day (QID) | RESPIRATORY_TRACT | Status: DC | PRN

## 2020-10-02 MED ORDER — VANCOMYCIN HCL IN DEXTROSE 1-5 GM/200ML-% IV SOLN: 1000.0000 mg | Freq: Once | INTRAVENOUS | Status: DC

## 2020-10-02 MED ORDER — SODIUM CHLORIDE 0.9 % IV SOLN: 2.0000 g | Freq: Once | INTRAVENOUS | Status: DC

## 2020-10-02 MED ORDER — ALLOPURINOL 300 MG PO TABS
300.0000 mg | ORAL_TABLET | Freq: Every day | ORAL | Status: DC
  Administered 2020-10-03 – 2020-10-09 (×7): 300 mg via ORAL
  Filled 2020-10-02 (×7): qty 1

## 2020-10-02 MED ORDER — MIDODRINE HCL 5 MG PO TABS
10.0000 mg | ORAL_TABLET | Freq: Three times a day (TID) | ORAL | Status: DC
  Administered 2020-10-02 – 2020-10-08 (×17): 10 mg via ORAL
  Filled 2020-10-02 (×17): qty 2

## 2020-10-02 MED ORDER — VANCOMYCIN HCL 1750 MG/350ML IV SOLN
1750.0000 mg | Freq: Once | INTRAVENOUS | Status: AC
  Administered 2020-10-02: 1750 mg via INTRAVENOUS
  Filled 2020-10-02: qty 350

## 2020-10-02 MED ORDER — VANCOMYCIN VARIABLE DOSE PER UNSTABLE RENAL FUNCTION (PHARMACIST DOSING)
Status: DC
  Filled 2020-10-02: qty 1

## 2020-10-02 MED ORDER — ONDANSETRON HCL 4 MG PO TABS: 4.0000 mg | ORAL_TABLET | Freq: Four times a day (QID) | ORAL | Status: DC | PRN

## 2020-10-02 MED ORDER — SODIUM ZIRCONIUM CYCLOSILICATE 10 G PO PACK
10.0000 g | PACK | Freq: Three times a day (TID) | ORAL | Status: AC
  Administered 2020-10-02 – 2020-10-03 (×3): 10 g via ORAL
  Filled 2020-10-02: qty 1
  Filled 2020-10-02: qty 2
  Filled 2020-10-02: qty 1

## 2020-10-02 MED ORDER — SODIUM CHLORIDE 0.9 % IV SOLN
2.0000 g | Freq: Once | INTRAVENOUS | Status: AC
  Administered 2020-10-02: 2 g via INTRAVENOUS
  Filled 2020-10-02: qty 2

## 2020-10-02 MED ORDER — SODIUM CHLORIDE 0.9 % IV SOLN
2.0000 g | Freq: Two times a day (BID) | INTRAVENOUS | Status: DC
  Administered 2020-10-03 – 2020-10-06 (×7): 2 g via INTRAVENOUS
  Filled 2020-10-02 (×7): qty 2

## 2020-10-02 MED ORDER — ONDANSETRON HCL 4 MG/2ML IJ SOLN
4.0000 mg | Freq: Four times a day (QID) | INTRAMUSCULAR | Status: DC | PRN
  Administered 2020-10-07: 4 mg via INTRAVENOUS
  Filled 2020-10-02: qty 2

## 2020-10-02 MED ORDER — ENSURE ENLIVE PO LIQD
237.0000 mL | Freq: Two times a day (BID) | ORAL | Status: DC
  Administered 2020-10-03 – 2020-10-09 (×11): 237 mL via ORAL

## 2020-10-02 MED ORDER — DONEPEZIL HCL 5 MG PO TABS
10.0000 mg | ORAL_TABLET | Freq: Every day | ORAL | Status: DC
  Administered 2020-10-02 – 2020-10-08 (×7): 10 mg via ORAL
  Filled 2020-10-02 (×7): qty 2

## 2020-10-02 MED ORDER — SODIUM CHLORIDE 0.9 % IV BOLUS
2000.0000 mL | Freq: Once | INTRAVENOUS | Status: AC
  Administered 2020-10-02: 2000 mL via INTRAVENOUS

## 2020-10-02 MED ORDER — ACETAMINOPHEN 650 MG RE SUPP: 650.0000 mg | Freq: Four times a day (QID) | RECTAL | Status: DC | PRN

## 2020-10-02 NOTE — H&P (Signed)
History and Physical  [facility]  Eric Bowman FKC:[id number] DOB: 09/07/1933 DOA: 10/02/2020  PCP: Eric Fee, NP  Patient coming from: [facility]   I have personally briefly reviewed patient's old medical records in [place]  Chief Complaint: altered mental status   HPI: Eric Bowman is a 86 y.o. male long term resident of [facility] with medical history significant for OSA, gout, CAD, diastolic CHF, GERD, hyperlipidemia, Hypertension, atrial fibrillation on apixaban, on torsemide for CHF, has dementia and apparently has not been eating and drinking well for the past several days.  He was noted to be more lethargic and was seen by the NP at the SNF and labs were drawn.  He was hypotensive at the time.  His labs revealed a severe hypernatremia, hyperkalemia and acute kidney injury. He was sent to the emergency department for further evaluation and treatments.    ED Course: he was tachycardic on arrival 100.8, pulse 147, BP 78/61, 99% on room air.  His labs revealed sodium of 161, potassium 6.4, BUN 105 creatinine 2.16, anion gap 17, CO2 26, glucose 116, AST 60, ALT 38, total bilirubin 1.4, estimated GFR 29, BNP 726, WBC 10.6, hemoglobin 17.4, hematocrit 59.4, INR 2.9, lactic acid 3.9, SARS 2 coronavirus test negative, influenza A and influenza B tests negative.  Urinalysis no infection seen, MRSA screen negative.  Chest x-Hashir with persistent right-sided atelectasis.  Cardiomegaly seen on chest x-Saulo.  PCCM was consulted and she was evaluated by Dr. Melvyn Novas.  He discussed the case fully with wife at bedside and she was made aware of how critically ill patient was at this time.  She elected a trial of treating with fluids and antibiotics to see if he will have a meaningful recovery.  No pressors or central line. He is to remain DNR status.   Review of Systems: UTO due to condition    Past Medical History:  Diagnosis Date  . Cancer (HCC)    melanoma-left side  . Chronic  pain    legs and feet  . Coronary artery disease   . Dementia ([place])   . Diabetes mellitus without complication ([place])   . Foot drop   . GERD (gastroesophageal reflux disease)   . Gout   . Hyperlipidemia   . Hypertension   . OCD (obsessive compulsive disorder)   . PONV (postoperative nausea and vomiting)   . Psychosis ([place])   . Sarcoidosis   . Sleep apnea    cpap    Past Surgical History:  Procedure Laterality Date  . BACK SURGERY     cervical neck  . CATARACT EXTRACTION W/PHACO  01/05/2012   Procedure: CATARACT EXTRACTION PHACO AND INTRAOCULAR LENS PLACEMENT (IOC);  Surgeon: [name] Branch, MD;  Location: [facility];  Service: Ophthalmology;  Laterality: Left;  CDE 18.47  . CATARACT EXTRACTION W/PHACO  02/02/2012   Procedure: CATARACT EXTRACTION PHACO AND INTRAOCULAR LENS PLACEMENT (IOC);  Surgeon: [name] Branch, MD;  Location: [facility];  Service: Ophthalmology;  Laterality: Right;  CDE:15.38  . CHOLECYSTECTOMY    . hemorhoidectomy    . MELANOMA EXCISION     left side-[name]  . TONSILLECTOMY       reports that he quit smoking about 46 years ago. His smoking use included pipe. He has a 5.00 pack-year smoking history. He has never used smokeless tobacco. He reports that he does not drink alcohol and does not use drugs.  Allergies  Allergen Reactions  . Other   .  Penicillin G   . Sulfonic Acid (3,5-Dibromo-4-H-Ox-Benz)   . Penicillins Rash    Pt tolerated cefepime on 10/02/20 with no issues.  . Sulfonamide Derivatives Rash  . Tetanus Toxoid Rash    Family History  Problem Relation Age of Onset  . Heart disease Other   . Arthritis Other   . Cancer Other   . Anesthesia problems Neg Hx   . Hypotension Neg Hx   . Malignant hyperthermia Neg Hx   . Pseudochol deficiency Neg Hx      Prior to Admission medications   Medication Sig Start Date End Date Taking? Authorizing Provider  acetaminophen (TYLENOL) 500 MG tablet Take 500 mg by mouth every 6 (six) hours as needed. For pain    Yes  [provider]  allopurinol (ZYLOPRIM) 300 MG tablet Take 300 mg by mouth daily.     Yes [provider]  apixaban (ELIQUIS) 5 MG TABS tablet Take 5 mg by mouth 2 (two) times daily. 07/21/20  Yes [provider]  atorvastatin (LIPITOR) 20 MG tablet Take 20 mg by mouth every evening. 06/21/20  Yes [provider]  [facility]) OINT Special Instructions: Apply to sacrum/coccyx and bilateral buttocks qshift prn erythema. 09/27/19  Yes [provider]  cetirizine (ZYRTEC) 10 MG tablet Take 10 mg by mouth at bedtime. 06/25/20  Yes [provider]  diltiazem (CARDIZEM CD) 120 MG 24 hr capsule Take 1 capsule (120 mg total) by mouth daily. 08/27/20 08/27/21 Yes Emokpae, Courage, MD  donepezil (ARICEPT) 10 MG tablet Take 10 mg by mouth at bedtime.  02/19/14  Yes [provider]  gabapentin (NEURONTIN) 100 MG capsule Take 100 mg by mouth at bedtime.   Yes [provider]  ipratropium-albuterol (DUONEB) 0.5-2.5 (3) MG/3ML SOLN Take 3 mLs by nebulization every 6 (six) hours as needed. 07/15/20  Yes [provider]  metoprolol succinate (TOPROL-XL) 50 MG 24 hr tablet Take 1 tablet (50 mg total) by mouth daily. 08/27/20  Yes Emokpae, Courage, MD  midodrine (PROAMATINE) 10 MG tablet Take 1 tablet (10 mg total) by mouth 3 (three) times daily with meals. 08/27/20  Yes Roxan Hockey, MD  NON FORMULARY Pureed NAS diet with nectar thick liquids 08/02/20  Yes [provider]  OXYGEN Inhale 2 L into the lungs continuous. for shortness of breath to keep 02 sat above 90% 08/01/20  Yes [provider]  pantoprazole (PROTONIX) 40 MG tablet Take 1 tablet (40 mg total) by mouth daily. 06/22/19  Yes Sinda Du, MD  potassium chloride SA (KLOR-CON) 20 MEQ tablet Take 20 mEq by mouth 2 (two) times daily.   Yes [provider]  torsemide (DEMADEX) 20 MG tablet Take 20 mg by mouth 2 (two) times daily.    Yes [provider]  Vitamins A & D (VITAMIN A & D) ointment Apply A&D ointment to bilateral heels qshift. Every Shift Day, Evening, Night 08/27/20  Yes [provider]  Amino Acids-Protein Hydrolys (FEEDING SUPPLEMENT, PRO-STAT SUGAR FREE 64,) LIQD Take 30 mLs by mouth in the morning and at bedtime. 08/20/20   [provider]    Physical Exam: Vitals:   10/02/20 1515 10/02/20 1530 10/02/20 1545 10/02/20 1600  BP: (!) 66/47 (!) 81/50 (!) 70/53 (!) 80/67  Pulse: (!) 49 77 85 (!) 52  Resp: 16 13 10 19   Temp: (!) 97.5 F (36.4 C) (!) 97.5 F (36.4 C) (!) 97.3 F (36.3 C) (!) 97.2 F (36.2 C)  TempSrc:      SpO2: 99% 96% 100% 94%  Weight:      Height:       Constitutional: frail, elderly male, awake, somnolent, NAD, calm, comfortable.  Eyes: PERRL, lids and conjunctivae normal ENMT: Mucous membranes are very dry. Posterior pharynx clear of any exudate or lesions. edentulous  Neck: normal, supple, no masses, no thyromegaly Respiratory: shallow breathing bilaterally, no wheezing, no crackles. Moderate increased work of breathing. No accessory muscle use.  Cardiovascular: irregularly irregular, no murmurs / rubs / gallops. No extremity edema. 2+ pedal pulses. No carotid bruits.  Abdomen: no tenderness, no masses palpated. No hepatosplenomegaly. Bowel sounds positive.  Musculoskeletal: no clubbing / cyanosis. No joint deformity upper and lower extremities. Good ROM, no contractures. Normal muscle tone.  Skin: no rashes, lesions, ulcers. No induration Neurologic: CN 2-12 grossly intact. Sensation intact, DTR normal. Strength 5/5 in all 4.  Psychiatric: pt has dementia. Normal mood.   Labs on Admission: I have personally reviewed following labs and imaging studies  CBC: Recent Labs  Lab 10/02/20 1019  WBC 10.6*  HGB 17.4*  HCT 59.4*  MCV 98.0  PLT 465   Basic Metabolic Panel: Recent Labs  Lab 10/02/20 1019 10/02/20 1207  NA 161*  --   K 6.4* 6.0*   CL 118*  --   CO2 26  --   GLUCOSE 116*  --   BUN 105*  --   CREATININE 2.16*  --   CALCIUM 10.2  --    GFR: Estimated Creatinine Clearance: 25 mL/min (A) (by C-G formula based on SCr of 2.16 mg/dL (H)). Liver Function Tests: Recent Labs  Lab 10/02/20 1019  AST 60*  ALT 38  ALKPHOS 130*  BILITOT 1.4*  PROT 7.7  ALBUMIN 3.8   No results for input(s): LIPASE, AMYLASE in the last 168 hours. No results for input(s): AMMONIA in the last 168 hours. Coagulation Profile: Recent Labs  Lab 10/02/20 1207  INR 2.9*   Cardiac Enzymes: No results for input(s): CKTOTAL, CKMB, CKMBINDEX, TROPONINI in the last 168 hours. BNP (last 3 results) No results for input(s): PROBNP in the last 8760 hours. HbA1C: No results for input(s): HGBA1C in the last 72 hours. CBG: No results for input(s): GLUCAP in the last 168 hours. Lipid Profile: No results for input(s): CHOL, HDL, LDLCALC, TRIG, CHOLHDL, LDLDIRECT in the last 72 hours. Thyroid Function Tests: No results for input(s): TSH, T4TOTAL, FREET4, T3FREE, THYROIDAB in the last 72 hours. Anemia Panel: No results for input(s): VITAMINB12, FOLATE, FERRITIN, TIBC, IRON, RETICCTPCT in the last 72 hours. Urine analysis:    Component Value Date/Time   COLORURINE YELLOW 10/02/2020 1155   APPEARANCEUR CLEAR 10/02/2020 1155   LABSPEC 1.012 10/02/2020 1155   PHURINE 5.0 10/02/2020 1155   GLUCOSEU NEGATIVE 10/02/2020 1155   HGBUR NEGATIVE 10/02/2020 [place] 10/02/2020 [place] 10/02/2020 1155   PROTEINUR NEGATIVE 10/02/2020 1155   NITRITE NEGATIVE 10/02/2020 1155   LEUKOCYTESUR NEGATIVE 10/02/2020 1155    Radiological Exams on Admission: DG Chest 2 View  Result Date: 10/02/2020 CLINICAL DATA:  Pleural effusion. EXAM: CHEST - 2 VIEW COMPARISON:  08/27/2020.  08/26/2020. FINDINGS: Cardiomegaly. No pulmonary venous congestion. Persistent right base atelectasis/infiltrate and right-sided pleural effusion. No  interim change. Nodular opacity noted over the left lung base most likely nipple shadow as it was not present on prior recent studies. Symmetric nipple opacity noted over the right chest wall. Degenerative changes lumbar spine and both  shoulders. IMPRESSION: 1. Cardiomegaly. No pulmonary venous congestion. 2. Persistent right base atelectasis/infiltrate and right-sided pleural effusion. No interim change. No pneumothorax. Electronically Signed   By: Marcello Moores  Register   On: 10/02/2020 11:05    EKG: Independently reviewed. Atrial fibrillation RVR  Assessment/Plan Active Problems:   Hyperkalemia   Obstructive sleep apnea on CPAP   GERD   Coronary artery disease   Multiple falls   Dyslipidemia   Vascular dementia without behavioral disturbance (HCC)   Chronic gout without tophus   Physical deconditioning   Pleural effusion   History of sarcoidosis   Atrial fibrillation with rapid ventricular response (HCC)   Hypotension   COPD with hypoxia (HCC)   Dysphagia   Chronic diastolic (congestive) heart failure (HCC)   Acute renal failure (HCC)   Hypernatremia   Hypertensive heart disease with chronic diastolic congestive heart failure (HCC)   Shock circulatory (HCC)   Severe dehydration   Lactic acidosis   DNR (do not resuscitate)    1. Severe sepsis with circulatory Shock - Continue empiric antibiotic therapy pending cultures and further work up.  CXR suggesting pneumonia.  Treat lactic acidosis to resolution.  Confirmed with wife, no pressors, no central line. Continue IV fluid hydration.  Discussed with wife at bedside that prognosis is guarded and that patient may not be able to recover from this illness. She verbalized understanding.  2. Severe dehydration - Pt has received 7L fluids in ED, continue to hydrate as tolerated.  He is starting to urinate now. Monitoring output.   3. Hypernatremia - Added free water to maintenance IV fluids.  4. Atrial fibrillation with RVR - his HR has been  improving with hydration, not able to give cardizem or metoprolol now due to hypotension.  5. Hyperkalemia - lokelma TID ordered, recheck potassium in AM.  6. Hypotension - from sepsis and dehydration, He is being aggressively hydrated.  No pressors or central line per family wishes.  7. Lactic acidosis - treating as above and following lactate levels.  8. OSA - will offer CPAP therapy in stepdown ICU.  9. Dysphagia - dys diet ordered.  10. Chronic diastolic heart failure - holding diuretics in setting of severe dehydration.  11. DNR - per wife, ok to give fluids and antibiotics, no pressors or central lines.  Reassess after these treatments to see if there is going to be any meaningful recovery.    DVT prophylaxis: SQ heparin  Code Status: DNR   Family Communication: wife at bedside   Disposition Plan: TBD  Consults called: intensivist PCCM  Admission status: INP   Critical Care Procedure Note Authorized and Performed by: Murvin Natal MD  Total Critical Care time:  67 minutes Due to a high probability of clinically significant, life threatening deterioration, the patient required my highest level of preparedness to intervene emergently and I personally spent this critical care time directly and personally managing the patient.  This critical care time included obtaining a history; examining the patient, pulse oximetry; ordering and review of studies; arranging urgent treatment with development of a management plan; evaluation of patient's response of treatment; frequent reassessment; and discussions with other providers.  This critical care time was performed to assess and manage the high probability of imminent and life threatening deterioration that could result in multi-organ failure.  It was exclusive of separately billable procedures and treating other patients and teaching time.   Irwin Brakeman MD [facility] How to contact the [facility] Attending or Consulting provider 7A -  7P or  covering provider during after hours [place], for this patient?  1. Check the care team in [facility] and look for a) attending/consulting TR[facility] provider listed and b) the [facility] team listed 2. Log into [email or web address] and use [place]'s universal password to access. If you do not have the password, please contact the hospital operator. 3. Locate the [facility] provider you are looking for under [facility] and page to a number that you can be directly reached. 4. If you still have difficulty reaching the provider, please page the [facility] (Director on Call) for the Hospitalists listed on amion for assistance.   If 7PM-7AM, please contact night-coverage [email or web address] Password [facility]  10/02/2020, 4:19 PM

## 2020-10-02 NOTE — Progress Notes (Signed)
Elink monitoring for sepsis. 

## 2020-10-02 NOTE — Progress Notes (Signed)
Location:   [facility] Room Number: 127-D Place of Service:  SNF (31)   CODE STATUS: dnr  Allergies  Allergen Reactions  . Other   . Penicillin G   . Sulfonic Acid (3,5-Dibromo-4-H-Ox-Benz)   . Penicillins Rash  . Sulfonamide Derivatives Rash  . Tetanus Toxoid Rash    Chief Complaint  Patient presents with  . Acute Visit    Status follow-up     HPI:  Today staff report that he has had a change in his status. He is more lethargic; his blood pressure is low at 90/60. There are no reports of aspiration; no reports of cough; no reports of vomiting.   Past Medical History:  Diagnosis Date  . Cancer (HCC)    melanoma-left side  . Chronic pain    legs and feet  . Coronary artery disease   . Dementia ([place])   . Diabetes mellitus without complication ([place])   . Foot drop   . GERD (gastroesophageal reflux disease)   . Gout   . Hyperlipidemia   . Hypertension   . OCD (obsessive compulsive disorder)   . PONV (postoperative nausea and vomiting)   . Psychosis ([place])   . Sarcoidosis   . Sleep apnea    cpap    Past Surgical History:  Procedure Laterality Date  . BACK SURGERY     cervical neck  . CATARACT EXTRACTION W/PHACO  01/05/2012   Procedure: CATARACT EXTRACTION PHACO AND INTRAOCULAR LENS PLACEMENT (IOC);  Surgeon: [name] Branch, MD;  Location: [facility];  Service: Ophthalmology;  Laterality: Left;  CDE 18.47  . CATARACT EXTRACTION W/PHACO  02/02/2012   Procedure: CATARACT EXTRACTION PHACO AND INTRAOCULAR LENS PLACEMENT (IOC);  Surgeon: [name] Branch, MD;  Location: [facility];  Service: Ophthalmology;  Laterality: Right;  CDE:15.38  . CHOLECYSTECTOMY    . hemorhoidectomy    . MELANOMA EXCISION     left side-[name]  . TONSILLECTOMY      Social History   Socioeconomic History  . Marital status: Married    Spouse name: Not on file  . Number of children: Not on file  . Years of education: Not on file  . Highest education level: Not on file  Occupational History  .  Occupation: retired   Tobacco Use  . Smoking status: Former Smoker    Packs/day: 0.25    Years: 20.00    Pack years: 5.00    Types: Pipe    Quit date: 01/02/1974    Years since quitting: 46.7  . Smokeless tobacco: Never Used  Vaping Use  . Vaping Use: Never used  Substance and Sexual Activity  . Alcohol use: No  . Drug use: No  . Sexual activity: Not Currently  Other Topics Concern  . Not on file  Social History Narrative   Former pipe smoker, quit many years ago.   Long term resident of [facility]      Social Determinants of Health   Financial Resource Strain:   . Difficulty of Paying Living Expenses: Not on file  Food Insecurity:   . Worried About Charity fundraiser in the Last Year: Not on file  . Ran Out of Food in the Last Year: Not on file  Transportation Needs:   . Lack of Transportation (Medical): Not on file  . Lack of Transportation (Non-Medical): Not on file  Physical Activity:   . Days of Exercise per Week: Not on file  . Minutes of Exercise per Session: Not on file  Stress:   . Feeling of Stress : Not on file  Social Connections:   . Frequency of Communication with Friends and Family: Not on file  . Frequency of Social Gatherings with Friends and Family: Not on file  . Attends Religious Services: Not on file  . Active Member of Clubs or Organizations: Not on file  . Attends Archivist Meetings: Not on file  . Marital Status: Not on file  Intimate Partner Violence:   . Fear of Current or Ex-Partner: Not on file  . Emotionally Abused: Not on file  . Physically Abused: Not on file  . Sexually Abused: Not on file   Family History  Problem Relation Age of Onset  . Heart disease Other   . Arthritis Other   . Cancer Other   . Anesthesia problems Neg Hx   . Hypotension Neg Hx   . Malignant hyperthermia Neg Hx   . Pseudochol deficiency Neg Hx       VITAL SIGNS BP 117/72   Pulse 100   Temp 97.8 F (36.6 C)   Resp 18   Ht 5' 9"  (1.753 m)   Wt  184 lb 9.6 oz (83.7 kg)   SpO2 92%   BMI 27.26 kg/m   Outpatient Encounter Medications as of 10/02/2020  Medication Sig  . acetaminophen (TYLENOL) 500 MG tablet Take 500 mg by mouth every 6 (six) hours as needed. For pain   . allopurinol (ZYLOPRIM) 300 MG tablet Take 300 mg by mouth daily.    . Amino Acids-Protein Hydrolys (FEEDING SUPPLEMENT, PRO-STAT SUGAR FREE 64,) LIQD Take 30 mLs by mouth in the morning and at bedtime.  [name] apixaban (ELIQUIS) 5 MG TABS tablet Take 5 mg by mouth 2 (two) times daily.  [name] (LIPITOR) 20 MG tablet Take 20 mg by mouth every evening.  Roseanne Kaufman [employer] [facility]) OINT Special Instructions: Apply to sacrum/coccyx and bilateral buttocks qshift prn erythema.  . cetirizine (ZYRTEC) 10 MG tablet Take 10 mg by mouth at bedtime.  [name] diltiazem (CARDIZEM CD) 120 MG 24 hr capsule Take 1 capsule (120 mg total) by mouth daily.  [name] donepezil (ARICEPT) 10 MG tablet Take 10 mg by mouth at bedtime.   . gabapentin (NEURONTIN) 100 MG capsule Take 100 mg by mouth at bedtime.  [name] ipratropium-albuterol (DUONEB) 0.5-2.5 (3) MG/3ML SOLN Take 3 mLs by nebulization every 6 (six) hours as needed.  . metoprolol succinate (TOPROL-XL) 50 MG 24 hr tablet Take 1 tablet (50 mg total) by mouth daily.  . midodrine (PROAMATINE) 10 MG tablet Take 1 tablet (10 mg total) by mouth 3 (three) times daily with meals.  . NON FORMULARY Pureed NAS diet with nectar thick liquids  . OXYGEN Inhale 2 L into the lungs continuous. for shortness of breath to keep 02 sat above 90%  . pantoprazole (PROTONIX) 40 MG tablet Take 1 tablet (40 mg total) by mouth daily.  . potassium chloride SA (KLOR-CON) 20 MEQ tablet Take 40 mEq by mouth 2 (two) times daily.   [name] torsemide (DEMADEX) 20 MG tablet Take 20 mg by mouth 2 (two) times daily. For Edema  . Vitamins A & D (VITAMIN A & D) ointment Apply A&D ointment to bilateral heels qshift. Every Shift Day, Evening, Night   No facility-administered encounter  medications on file as of 10/02/2020.     SIGNIFICANT DIAGNOSTIC EXAMS   PREVIOUS;   12-17-19: CT of head and cervical spine:  1. No acute intracranial pathology. Small-vessel  white matter disease. 2. No fracture or static subluxation of the cervical spine. 3. Anterior cervical discectomy and fusion of C4 through C6, with incorporation of the disc spaces and bony ankylosis of the included levels inferior to this through T3. 4. Prominent osteophytes, disc calcifications and calcifications of the ligamentum flavum, which appear to significantly narrow the cervical canal at C3-C4, minimum AP diameter approximately 4 mm.  02-13-20: MRI lumbar spine:  1. L2 fracture involving the anterior wall and superior endplate, likely subacute. The appearance suggest a hyperextension mechanism. No height loss. 2. L3-L4 severe spinal canal stenosis with severe right and moderate left neural foraminal stenosis. 3. T12-L1 moderate spinal canal stenosis and severe bilateral neural foraminal stenosis. 4. L1-L2 and L2-L3 severe right neural foraminal stenosis. 5. L5-S1 moderate bilateral neural foraminal stenosis.   02-13-20: MRI cervical spine:  1. 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 2. C4-6 ACDF without spinal canal stenosis. 3. Mild bilateral C6-7 neural foraminal stenosis.  03-20-20: Myoview:  No diagnostic ST segment changes to indicate ischemia. Small, moderate intensity, apical septal and apical to basal inferolateral defects. The inferolateral defect is fixed and consistent with possible scar and the apical septal defect is partially reversible consistent with a mild ischemic territory.This is a high risk study based on calculated LVEF. Would suggest confirmatory echocardiogram. Nuclear stress EF: 15%  04-10-20 2-d echo:  Left ventricular ejection fraction, by estimation, is 55 to  60%. The  left ventricle has normal function. The left ventricle has no regional  wall motion abnormalities. Left ventricular diastolic parameters are  consistent with age-related delayed  relaxation (normal).   07-20-20: chest x-Tudor:   Opacification of most of the right hemithorax due to a combination of pleural effusion and consolidation. There is also consolidation in the medial left base. Heart borderline enlarged. There are foci of left carotid artery calcification.  07-27-20 2-d echo:  Left ventricular ejection fraction, by estimation, is 60 to 65%. The  left ventricle has normal function. There is mild left ventricular hypertrophy   07-29-20 chest x-Essa:  1. Cardiomegaly. Pulmonary venous congestion. Bilateral interstitial prominence suggesting interstitial edema and or pneumonitis. Right-sided prominent pleural effusion again noted, decreased in size from prior chest x-Radek. 2.  Carotid vascular disease  07-28-20: right thoracentesis: Successful ultrasound guided RIGHT thoracentesis yielding 1280 mL of pleural fluid.  07-28-20: chest x-Leviticus:  No pneumothorax following thoracentesis. Residual bibasilar pleural effusions and atelectasis.  07-31-20: swallow study: Recommend continue with D1/puree diet and NECTAR THICK liquids with meds crushed in puree.   08-10-20: chest x-Gonzalo: chf with mild to moderate right effusion seen. Mild interval progression noted.   08-20-20: chest x-Deanthony: Bilateral pleural effusions right greater than left increased from the prior exam  08-26-20: chest x-Errick:  1. Stable compared to 6 days ago. 2. Moderate right pleural effusion.  08-27-20: right thoracentesis: A total of approximately 580 mL of slightly cloudy yellow RIGHT pleural fluid was removed.  NO NEW EXAMS.   LABS REVIEWED PREVIOUS;    10-05-19: wbc 4.8; hgb 11.8; hct 36.2; mcv 89.8 plt 256; glucose 100; bun 18; creat 0.53 ;k+ 3.9; an++ 128; ca 8.6; d-dimer: 3.36 ferritin 965 10-16-19: glucose 96; bun  14; creat 0.47; k+ 3.7; na++ 134; ca 8.5 12-02-19: wbc 5.9; hgb 12.4; hct 38.9; mcv 91.1 plt 227; chol 96; ldl 43; trig 45; hdl 44  01-30-20: chol 96; ldl 43; trig 45; hdl  44  06-11-20: wbc 5.5; hgb 12.5; hct 39.0 mcv 92.6 plt 184; glucose 99; bun 26; creat 0.68; k+ 4.6; na++ 131; ca 8.9 liver normal albumin 3.5 hgb a1c 5.5; tsh 2.617 07-20-20: wbc 5.1; hgb 13.0; hct 41.2; mcv 90.7 plt 176; glucose 110; bun 25; creat 0.52; k+ 4.5; na++ 129; ca 8.7  07-21-20: wbc 5.7; hgb 13.5; hct 42.0; mcv 91.1 plt 171 glucose 106; bun 21; creat 0.58; k+ 4.1; na++ 132; ca 9.0 07-23-20; glucose 110; bun 20; creat 0.62; k+ 3.4; na++ 133; ca 8.7 liver normal albumin 3.2  07-27-20: wbc 8.4; hgb 13.6; hct 41.4; mcv 90.6 plt 133; glucose 104; bun 29; creat 0.88; k+ 3.5; na++ 138; ca 8.4 liver normal albumin 3.4 tsh 2.324 blood and urine culture: no growth 07-29-20: wbc 8.1; hgb 13.0; hct 42.1; mcv 91.9 plt 145; glucose 101; bun 27; creat 0.81; k+ 3.5; na++ 139; ca 8.4 mag 1.8 08-01-20: wbc 7.4; hgb 14.3; hct 45.8; mcv 91.6 plt 169; glucose 89; bun 24; creat 0.74; k+ 4.7; na++ 140; ca 8.5 mag 1.8  08-13-20: BNP 432.0 08-20-20: glucose 113; bun 81; creat 1.46; k+ 4.2; na++ 150; ca 9.6 08-21-20: glucose 103; bun 66; creat 1.19 k+ 3.8; na++ 147; ca 8.8  08-24-20: glucose 108; bun 43; creat 0.98; k+ 3.6; na++ 144; ca 9.2  08-26-20: wbc 7.4; hgb 14.4; hct 46.5; mcv 92.3 plt 255; glucose 94; bun 33; creat 0.91; k+ 3.8; na++ 144; ca 9.5 liver normal albumin 3.3 08-27-20: wbc 6.5; hgb 14.1; hct 45.7; mcv 92.7 plt 212; glucose 89; bun 26; creat 0.74; k+ 3.4; na++ 142; ca 8.9 liver normal albumin 3.1 LDH 131  TODAY  10-02-20: wbc 10.6; hgb 17.4; hct 59.4 mcv 98.0 plt 295; glucose 116; bun 105; creat 2.16; k+ 6.4; na++ 161; alk phos 130; ast 60; total bili 1.4 albumin 3.8 BNP 726.0   Review of Systems  Unable to perform ROS: Medical condition    Physical Exam Constitutional:      General: He is not in acute distress.    Appearance:  He is well-developed. He is not diaphoretic.  HENT:     Mouth/Throat:     Mouth: Mucous membranes are dry.  Eyes:     Comments: Bilateral cataract with lens implants     Neck:     Thyroid: No thyromegaly.  Cardiovascular:     Rate and Rhythm: Normal rate and regular rhythm.     Pulses: Normal pulses.     Heart sounds: Normal heart sounds.  Pulmonary:     Effort: Pulmonary effort is normal. No respiratory distress.     Breath sounds: Rhonchi present.  Abdominal:     General: Bowel sounds are normal. There is no distension.     Palpations: Abdomen is soft.     Tenderness: There is no abdominal tenderness.  Musculoskeletal:     Cervical back: Neck supple.     Right lower leg: No edema.     Left lower leg: No edema.     Comments: Is able to move all extremities   Lymphadenopathy:     Cervical: No cervical adenopathy.  Skin:    General: Skin is warm and dry.  Neurological:     Mental Status: He is alert. Mental status is at baseline.  Psychiatric:        Mood and Affect: Mood normal.       ASSESSMENT/ PLAN:  TODAY   1. Acute renal failure; unspecified renal failure type 2.  Hyperkalemia 3. Hypernatremia  Will stop k+ and demadex Will send to the ED for further workup and treatment.    MD is aware of resident's narcotic use and is in agreement with current plan of care. We will attempt to wean resident as appropriate.  Ok Edwards NP [facility]  Contact [phone] Monday through Friday 8am- 5pm  After hours call [phone]

## 2020-10-02 NOTE — ED Triage Notes (Signed)
Pt presents to ED from [facility]. DNR is present. Pt labs are sodium 161 potassium 6.4.

## 2020-10-02 NOTE — ED Triage Notes (Deleted)
Pt presents to ED from [facility]. Pt labs are sodium 161 and potassium 6.4. Pt is not answering questions.

## 2020-10-02 NOTE — ED Notes (Signed)
Pt placed in Trendelenburg position

## 2020-10-02 NOTE — ED Notes (Signed)
Dr. Roderic Palau notified of BP & hr.

## 2020-10-02 NOTE — ED Provider Notes (Signed)
[facility] EMERGENCY DEPARTMENT Provider Note   CSN: [id number] Arrival date & time: 10/02/20  1133     History Chief Complaint  Patient presents with  . Hypotension    Eric Bowman is a 86 y.o. male.  Patient brought into the emergency department for altered mental status. Labs were drawn at the nursing home and he was severely dehydrated patient was also hypotensive in the nursing home. He is a DNR.  He has significant medical problems heart failure dementia COPD  The history is provided by the nursing home and medical records. No language interpreter was used.  Weakness Severity:  Moderate Onset quality:  Sudden Timing:  Constant Progression:  Worsening Chronicity:  Recurrent Context: not alcohol use   Relieved by:  Nothing Worsened by:  Nothing Ineffective treatments:  None tried Associated symptoms: no abdominal pain        Past Medical History:  Diagnosis Date  . Cancer (HCC)    melanoma-left side  . Chronic pain    legs and feet  . Coronary artery disease   . Dementia ([place])   . Diabetes mellitus without complication ([place])   . Foot drop   . GERD (gastroesophageal reflux disease)   . Gout   . Hyperlipidemia   . Hypertension   . OCD (obsessive compulsive disorder)   . PONV (postoperative nausea and vomiting)   . Psychosis ([place])   . Sarcoidosis   . Sleep apnea    cpap    Patient Active Problem List   Diagnosis Date Noted  . Hyperkalemia 10/02/2020  . Shock circulatory ([place]) 10/02/2020  . Severe dehydration 10/02/2020  . Encounter for annual wellness visit (AWV) in Medicare patient 09/18/2020  . Hypertensive heart disease with chronic diastolic congestive heart failure ([place]) 08/26/2020  . Acute renal failure ([place]) 08/25/2020  . Hypernatremia 08/25/2020  . Chronic diastolic (congestive) heart failure ([place]) 08/14/2020  . Dysphagia 08/05/2020  . Hypotension 08/04/2020  . COPD with hypoxia ([place]) 08/04/2020  . Bilateral lower extremity edema  08/04/2020  . S/P thoracentesis   . Atrial fibrillation with rapid ventricular response ([place]) 07/27/2020  . History of sarcoidosis 07/22/2020  . History of melanoma 07/22/2020  . HCAP (healthcare-associated pneumonia) 07/20/2020  . Pleural effusion 07/20/2020  . Elevated brain natriuretic peptide (BNP) level 07/20/2020  . Cellulitis of abdominal wall 07/07/2020  . Chronic non-seasonal allergic rhinitis 06/26/2020  . Weight gain 06/11/2020  . Weight loss 12/17/2019  . Poliomyelitis osteopathy of lower leg, right ([place]) 11/01/2019  . COVID-19 10/15/2019  . Radiculopathy of cervical spine 09/09/2019  . Dyslipidemia 06/24/2019  . Vascular dementia without behavioral disturbance ([place]) 06/24/2019  . Chronic gout without tophus 06/24/2019  . Physical deconditioning 06/24/2019  . Multiple falls 06/20/2019  . Arthritis of knee, left 01/03/2013  . Degenerative disc disease, lumbar 01/03/2013  . Coronary artery disease 09/13/2011  . Obstructive sleep apnea on CPAP 09/10/2007  . Essential hypertension 05/02/2007  . GERD 05/02/2007    Past Surgical History:  Procedure Laterality Date  . BACK SURGERY     cervical neck  . CATARACT EXTRACTION W/PHACO  01/05/2012   Procedure: CATARACT EXTRACTION PHACO AND INTRAOCULAR LENS PLACEMENT (IOC);  Surgeon: [name] Branch, MD;  Location: [facility];  Service: Ophthalmology;  Laterality: Left;  CDE 18.47  . CATARACT EXTRACTION W/PHACO  02/02/2012   Procedure: CATARACT EXTRACTION PHACO AND INTRAOCULAR LENS PLACEMENT (IOC);  Surgeon: [name] Branch, MD;  Location: [facility];  Service: Ophthalmology;  Laterality: Right;  CDE:15.38  .  CHOLECYSTECTOMY    . hemorhoidectomy    . MELANOMA EXCISION     left side-[name]  . TONSILLECTOMY         Family History  Problem Relation Age of Onset  . Heart disease Other   . Arthritis Other   . Cancer Other   . Anesthesia problems Neg Hx   . Hypotension Neg Hx   . Malignant hyperthermia Neg Hx   . Pseudochol deficiency Neg Hx      Social History   Tobacco Use  . Smoking status: Former Smoker    Packs/day: 0.25    Years: 20.00    Pack years: 5.00    Types: Pipe    Quit date: 01/02/1974    Years since quitting: 46.7  . Smokeless tobacco: Never Used  Vaping Use  . Vaping Use: Never used  Substance Use Topics  . Alcohol use: No  . Drug use: No    Home Medications Prior to Admission medications   Medication Sig Start Date End Date Taking? Authorizing Provider  acetaminophen (TYLENOL) 500 MG tablet Take 500 mg by mouth every 6 (six) hours as needed. For pain    Yes [provider]  allopurinol (ZYLOPRIM) 300 MG tablet Take 300 mg by mouth daily.     Yes [provider]  apixaban (ELIQUIS) 5 MG TABS tablet Take 5 mg by mouth 2 (two) times daily. 07/21/20  Yes [provider]  atorvastatin (LIPITOR) 20 MG tablet Take 20 mg by mouth every evening. 06/21/20  Yes [provider]  Janne Lab [facility]) OINT Special Instructions: Apply to sacrum/coccyx and bilateral buttocks qshift prn erythema. 09/27/19  Yes [provider]  cetirizine (ZYRTEC) 10 MG tablet Take 10 mg by mouth at bedtime. 06/25/20  Yes [provider]  diltiazem (CARDIZEM CD) 120 MG 24 hr capsule Take 1 capsule (120 mg total) by mouth daily. 08/27/20 08/27/21 Yes Emokpae, Courage, MD  donepezil (ARICEPT) 10 MG tablet Take 10 mg by mouth at bedtime.  02/19/14  Yes [provider]  gabapentin (NEURONTIN) 100 MG capsule Take 100 mg by mouth at bedtime.   Yes [provider]  ipratropium-albuterol (DUONEB) 0.5-2.5 (3) MG/3ML SOLN Take 3 mLs by nebulization every 6 (six) hours as needed. 07/15/20  Yes [provider]  metoprolol succinate (TOPROL-XL) 50 MG 24 hr tablet Take 1 tablet (50 mg total) by mouth daily. 08/27/20  Yes Emokpae, Courage, MD  midodrine (PROAMATINE) 10 MG tablet Take 1 tablet (10 mg total) by mouth 3 (three) times daily with meals. 08/27/20  Yes  Roxan Hockey, MD  NON FORMULARY Pureed NAS diet with nectar thick liquids 08/02/20  Yes [provider]  OXYGEN Inhale 2 L into the lungs continuous. for shortness of breath to keep 02 sat above 90% 08/01/20  Yes [provider]  pantoprazole (PROTONIX) 40 MG tablet Take 1 tablet (40 mg total) by mouth daily. 06/22/19  Yes Sinda Du, MD  potassium chloride SA (KLOR-CON) 20 MEQ tablet Take 20 mEq by mouth 2 (two) times daily.   Yes [provider]  torsemide (DEMADEX) 20 MG tablet Take 20 mg by mouth 2 (two) times daily.   Yes [provider]  Vitamins A & D (VITAMIN A & D) ointment Apply A&D ointment to bilateral heels qshift. Every Shift Day, Evening, Night 08/27/20  Yes [provider]  Amino Acids-Protein Hydrolys (FEEDING SUPPLEMENT, PRO-STAT SUGAR FREE 64,) LIQD Take 30 mLs by mouth in the morning and  at bedtime. 08/20/20   [provider]    Allergies    Other; Penicillin g; Sulfonic acid (3,5-dibromo-4-h-ox-benz); Penicillins; Sulfonamide derivatives; and Tetanus toxoid  Review of Systems   Review of Systems  Unable to perform ROS: Mental status change  Gastrointestinal: Negative for abdominal pain.  Neurological: Positive for weakness.    Physical Exam Updated Vital Signs BP (!) 85/37   Pulse (!) 101   Temp 97.9 F (36.6 C)   Resp 13   Ht 5\' 9"  (1.753 m)   Wt 79.4 kg   SpO2 96%   BMI 25.84 kg/m   Physical Exam Vitals reviewed.  Constitutional:      Appearance: He is well-developed. He is ill-appearing.  HENT:     Head: Normocephalic.     Nose: Nose normal.  Eyes:     General: No scleral icterus.    Conjunctiva/sclera: Conjunctivae normal.  Neck:     Thyroid: No thyromegaly.  Cardiovascular:     Rate and Rhythm: Normal rate and regular rhythm.     Heart sounds: No murmur heard.  No friction rub. No gallop.   Pulmonary:     Breath sounds: No stridor. No wheezing or rales.  Chest:     Chest wall:  No tenderness.  Abdominal:     General: There is no distension.     Tenderness: There is no abdominal tenderness. There is no rebound.  Musculoskeletal:        General: Normal range of motion.     Cervical back: Neck supple.  Lymphadenopathy:     Cervical: No cervical adenopathy.  Skin:    Findings: No erythema or rash.  Neurological:     Motor: No abnormal muscle tone.     Coordination: Coordination normal.     Comments: Oriented to person and place     ED Results / Procedures / Treatments   Labs (all labs ordered are listed, but only abnormal results are displayed) Labs Reviewed  LACTIC ACID, PLASMA - Abnormal; Notable for the following components:      Result Value   Lactic Acid, Venous 3.9 (*)    All other components within normal limits  PROTIME-INR - Abnormal; Notable for the following components:   Prothrombin Time 29.7 (*)    INR 2.9 (*)    All other components within normal limits  POTASSIUM - Abnormal; Notable for the following components:   Potassium 6.0 (*)    All other components within normal limits  CULTURE, BLOOD (ROUTINE X 2)  CULTURE, BLOOD (ROUTINE X 2)  URINE CULTURE  RESP PANEL BY RT-PCR (FLU A&B, COVID) ARPGX2  MRSA PCR SCREENING  URINALYSIS, ROUTINE W REFLEX MICROSCOPIC  APTT  PROCALCITONIN    EKG None  Radiology DG Chest 2 View  Result Date: 10/02/2020 CLINICAL DATA:  Pleural effusion. EXAM: CHEST - 2 VIEW COMPARISON:  08/27/2020.  08/26/2020. FINDINGS: Cardiomegaly. No pulmonary venous congestion. Persistent right base atelectasis/infiltrate and right-sided pleural effusion. No interim change. Nodular opacity noted over the left lung base most likely nipple shadow as it was not present on prior recent studies. Symmetric nipple opacity noted over the right chest wall. Degenerative changes lumbar spine and both shoulders. IMPRESSION: 1. Cardiomegaly. No pulmonary venous congestion. 2. Persistent right base atelectasis/infiltrate and right-sided  pleural effusion. No interim change. No pneumothorax. Electronically Signed   By: Marcello Moores  Register   On: 10/02/2020 11:05    Procedures Procedures (including critical care time)  Medications Ordered in ED Medications  lactated  ringers infusion ( Intravenous New Bag/Given 10/02/20 1329)  vancomycin (VANCOREADY) IVPB 1750 mg/350 mL (1,750 mg Intravenous New Bag/Given 10/02/20 1325)  vancomycin variable dose per unstable renal function (pharmacist dosing) (has no administration in time range)  sodium chloride 0.9 % bolus 1,000 mL (1,000 mLs Intravenous Not Given 10/02/20 1310)  sodium chloride 0.9 % bolus 2,000 mL (0 mLs Intravenous Stopped 10/02/20 1257)  lactated ringers bolus 1,000 mL (1,000 mLs Intravenous New Bag/Given 10/02/20 1253)  sodium chloride 0.9 % bolus 1,000 mL (0 mLs Intravenous Stopped 10/02/20 1428)  ceFEPIme (MAXIPIME) 2 g in sodium chloride 0.9 % 100 mL IVPB (0 g Intravenous Stopped 10/02/20 1428)    ED Course  I have reviewed the triage vital signs and the nursing notes.  Pertinent labs & imaging results that were available during my care of the patient were reviewed by me and considered in my medical decision making (see chart for details). CRITICAL CARE Performed by: Milton Ferguson Total critical care time: 45 minutes Critical care time was exclusive of separately billable procedures and treating other patients. Critical care was necessary to treat or prevent imminent or life-threatening deterioration. Critical care was time spent personally by me on the following activities: development of treatment plan with patient and/or surrogate as well as nursing, discussions with consultants, evaluation of patient's response to treatment, examination of patient, obtaining history from patient or surrogate, ordering and performing treatments and interventions, ordering and review of laboratory studies, ordering and review of radiographic studies, pulse oximetry and re-evaluation of  patient's condition. Patient with possible sepsis and dehydration. He was seen by critical care and it was decided by the patient and his wife that he will continue being a DNR and he will not receive any pressors or get a central line. He will be treated with just fluids antibiotics and O2.   MDM Rules/Calculators/A&P                         Patient with dehydration and possible sepsis he is a DNR.  Patient was seen by critical care Final Clinical Impression(s) / ED Diagnoses Final diagnoses:  Acute sepsis [facility])    Rx / DC Orders ED Discharge Orders    None       Milton Ferguson, MD 10/05/20 [phone]

## 2020-10-02 NOTE — Progress Notes (Addendum)
Pharmacy Antibiotic Note  Eric Bowman is a 86 y.o. male admitted on 10/02/2020 with sepsis  due to unknown source, but presumed PNA. Pharmacy has been consulted for vancomycin and aztreonam  dosing.    After discussion w/ ED provider, it was decided that a one-time dose of cefepime should be given to assess patient's tolerance as patient got a dose of cefepime on 07-27-20.  Patient received cefepime 2g at 1322 today and has exhibited no adverse reaction.   Plan: Start cefepime 2g IV q12h Give vancomycin 1.75 g  IV x1 dose now-->vanc now dc'd d/t negative MRSA PCR   Pharmacy will continue to monitor renal function,   cultures and patient progress.   Height: 5\' 9"  (175.3 cm) Weight: 79.4 kg (175 lb) IBW/kg (Calculated) : 70.7  Temp (24hrs), Avg:98.7 F (37.1 C), Min:97.8 F (36.6 C), Max:100.8 F (38.2 C)  Recent Labs  Lab 10/02/20 1019  WBC 10.6*  CREATININE 2.16*    Estimated Creatinine Clearance: 25 mL/min (A) (by C-G formula based on SCr of 2.16 mg/dL (H)).    Allergies  Allergen Reactions  . Other   . Penicillin G   . Sulfonic Acid (3,5-Dibromo-4-H-Ox-Benz)   . Penicillins Rash  . Sulfonamide Derivatives Rash  . Tetanus Toxoid Rash    Antimicrobials this admission: vancomycin 12/3 >>   aztreonam 12/3 >>    Dose adjustments this admission: vancomycin and aztreonam  Microbiology results: 12/3 [facility] x2:  12/3 UCx:    12/3 Resp PCR: SARS CoV-2: neg        Flu A/B:  neg 12/3 MRSA PCR:  negative  Thank you for allowing pharmacy to be a part of this patient's care.  Despina Pole 10/02/2020 12:46 PM

## 2020-10-02 NOTE — Consult Note (Signed)
NAME:  Eric Bowman, MRN:  [id number], DOB:  1932/12/21, LOS: 0 ADMISSION DATE:  10/02/2020, CONSULTATION DATE:  12/3 REFERRING MD:  Roderic Palau, CHIEF COMPLAINT:  shock   Brief History   70 yowm retired Civil engineer, contracting with remote sarcoid hx bedridden x several years with dementia and chronic R pl effusion eval by Dr Ander Slade previously assoc with peripheral edema and maint on demadex at [facility] to ER am 12/3 with shock assoc with profound dehydration but DNR status confirmed with wife p PCCM consulted re disposition.     echo 07/27/20 1. Left ventricular ejection fraction, by estimation, is 60 to 65%. The  left ventricle has normal function. There is mild left ventricular  hypertrophy.  2. Right ventricular systolic function is mildly reduced. The right  ventricular size is moderately enlarged.  3. Left atrial size was moderately dilated.  4. Right atrial size was moderately dilated.  5. The pericardial effusion is circumferential.  6. The aortic valve was not well visualized.  7. Limited echo in the setting of new onset atrial fibrillation   Interim history/subjective:  Starting to arouse p 2 liters NS, winks at wife but mostly non verbal, denies cp or sob or cough.   Nods yes to "are you thirsty"   Objective   Blood pressure 92/77, pulse (!) 138, temperature (!) 100.8 F (38.2 C), temperature source Rectal, resp. rate (!) 32, height 5\' 9"  (1.753 m), weight 79.4 kg, SpO2 98 %.        Intake/Output Summary (Last 24 hours) at 10/02/2020 1355 Last data filed at 10/02/2020 1257 Gross per 24 hour  Intake 2000 ml  Output --  Net 2000 ml   Filed Weights   10/02/20 1141  Weight: 79.4 kg    Examination: Tmax  General: chronically ill elderly wm with sats 94% on RA  HENT: very dry mucosa Lungs: decreased bs R Base Cardiovascular: IRIR at 140 Bowman to 130 by end  Of 3rd liter Abdomen: soft/ benign Extremities: decub bandages over heals Neuro: knows [name], wife's  name    I personally reviewed images and agree with radiology impression as follows:  CXR:   12/3  Pa and lat 1. Cardiomegaly. No pulmonary venous congestion. 2. Persistent right base atelectasis/infiltrate and right-sided pleural effusion. No interim change. No pneumothorax.      Assessment & Plan:    1) Circulatory shock secondary to severe dehydration on demadex and puree diet for chronic aspiration/ limited po intake  >> needs at least 6 liters of NS then shift over to 1/2 NS to correct the NA >> not a candidate for cvl or HD    2) chronic R effusion not an isue at his point  - see echo ? If pericardial effusion is relevant but the issue is moot at this point as no further invasive procedures being contemplated at this point.   3)  Hct 59% c/w dehydration, not polycythemia   4) AKI also secondary to 1, no specific rx other than to vol restore him as mostly pre renal azotemia likely   Lab Results  Component Value Date   CREATININE 2.16 (H) 10/02/2020   CREATININE 0.74 09/03/2020   CREATININE 0.74 08/27/2020     5)  No evidence of sepsis >>> hold further abx though repeat cxr p fluids to see if any as fluffs out and what cultures reveal   Based on my conversation with wife pm 12/3  No escalation of care beyond fluids/ 02/ abx  if needed so pt can be admitted to [facility]/ pccm f/u prn - discussed with Dr Roderic Palau  Labs   CBC: Recent Labs  Lab 10/02/20 1019  WBC 10.6*  HGB 17.4*  HCT 59.4*  MCV 98.0  PLT 211    Basic Metabolic Panel: Recent Labs  Lab 10/02/20 1019 10/02/20 1207  NA 161*  --   K 6.4* 6.0*  CL 118*  --   CO2 26  --   GLUCOSE 116*  --   BUN 105*  --   CREATININE 2.16*  --   CALCIUM 10.2  --    GFR: Estimated Creatinine Clearance: 25 mL/min (A) (by C-G formula based on SCr of 2.16 mg/dL (H)). Recent Labs  Lab 10/02/20 1019 10/02/20 1207  WBC 10.6*  --   LATICACIDVEN  --  3.9*    Liver Function Tests: Recent Labs  Lab 10/02/20 1019   AST 60*  ALT 38  ALKPHOS 130*  BILITOT 1.4*  PROT 7.7  ALBUMIN 3.8   No results for input(s): LIPASE, AMYLASE in the last 168 hours. No results for input(s): AMMONIA in the last 168 hours.  ABG No results found for: PHART, PCO2ART, PO2ART, HCO3, TCO2, ACIDBASEDEF, O2SAT   Coagulation Profile: Recent Labs  Lab 10/02/20 1207  INR 2.9*    Cardiac Enzymes: No results for input(s): CKTOTAL, CKMB, CKMBINDEX, TROPONINI in the last 168 hours.  HbA1C: Hgb A1c MFr Bld  Date/Time Value Ref Range Status  06/11/2020 04:20 AM 5.5 4.8 - 5.6 % Final    Comment:    (NOTE) Pre diabetes:          5.7%-6.4%  Diabetes:              >6.4%  Glycemic control for   <7.0% adults with diabetes   08/23/2019 09:30 AM 5.6 4.8 - 5.6 % Final    Comment:    (NOTE) Pre diabetes:          5.7%-6.4% Diabetes:              >6.4% Glycemic control for   <7.0% adults with diabetes     CBG: No results for input(s): GLUCAP in the last 168 hours.     Past Medical History  He,  has a past medical history of Cancer ([place]), Chronic pain, Coronary artery disease, Dementia ([place]), Diabetes mellitus without complication ([place]), Foot drop, GERD (gastroesophageal reflux disease), Gout, Hyperlipidemia, Hypertension, OCD (obsessive compulsive disorder), PONV (postoperative nausea and vomiting), Psychosis ([place]), Sarcoidosis, and Sleep apnea.   Surgical History    Past Surgical History:  Procedure Laterality Date  . BACK SURGERY     cervical neck  . CATARACT EXTRACTION W/PHACO  01/05/2012   Procedure: CATARACT EXTRACTION PHACO AND INTRAOCULAR LENS PLACEMENT (IOC);  Surgeon: [name] Branch, MD;  Location: [facility];  Service: Ophthalmology;  Laterality: Left;  CDE 18.47  . CATARACT EXTRACTION W/PHACO  02/02/2012   Procedure: CATARACT EXTRACTION PHACO AND INTRAOCULAR LENS PLACEMENT (IOC);  Surgeon: [name] Branch, MD;  Location: [facility];  Service: Ophthalmology;  Laterality: Right;  CDE:15.38  . CHOLECYSTECTOMY    .  hemorhoidectomy    . MELANOMA EXCISION     left side-[name]  . TONSILLECTOMY       Social History   reports that he quit smoking about 46 years ago. His smoking use included pipe. He has a 5.00 pack-year smoking history. He has never used smokeless tobacco. He reports that he does not drink alcohol and does not use  drugs.   Family History   His family history includes Arthritis in an other family member; Cancer in an other family member; Heart disease in an other family member. There is no history of Anesthesia problems, Hypotension, Malignant hyperthermia, or Pseudochol deficiency.   Allergies Allergies  Allergen Reactions  . Other   . Penicillin G   . Sulfonic Acid (3,5-Dibromo-4-H-Ox-Benz)   . Penicillins Rash  . Sulfonamide Derivatives Rash  . Tetanus Toxoid Rash     Home Medications  Prior to Admission medications   Medication Sig Start Date End Date Taking? Authorizing Provider  acetaminophen (TYLENOL) 500 MG tablet Take 500 mg by mouth every 6 (six) hours as needed. For pain    Yes [provider]  allopurinol (ZYLOPRIM) 300 MG tablet Take 300 mg by mouth daily.     Yes [provider]  apixaban (ELIQUIS) 5 MG TABS tablet Take 5 mg by mouth 2 (two) times daily. 07/21/20  Yes [provider]  atorvastatin (LIPITOR) 20 MG tablet Take 20 mg by mouth every evening. 06/21/20  Yes [provider]  Janne [facility]) OINT Special Instructions: Apply to sacrum/coccyx and bilateral buttocks qshift prn erythema. 09/27/19  Yes [provider]  cetirizine (ZYRTEC) 10 MG tablet Take 10 mg by mouth at bedtime. 06/25/20  Yes [provider]  diltiazem (CARDIZEM CD) 120 MG 24 hr capsule Take 1 capsule (120 mg total) by mouth daily. 08/27/20 08/27/21 Yes Emokpae, Courage, MD  donepezil (ARICEPT) 10 MG tablet Take 10 mg by mouth at bedtime.  02/19/14  Yes [provider]  gabapentin (NEURONTIN) 100 MG capsule Take 100  mg by mouth at bedtime.   Yes [provider]  ipratropium-albuterol (DUONEB) 0.5-2.5 (3) MG/3ML SOLN Take 3 mLs by nebulization every 6 (six) hours as needed. 07/15/20  Yes [provider]  metoprolol succinate (TOPROL-XL) 50 MG 24 hr tablet Take 1 tablet (50 mg total) by mouth daily. 08/27/20  Yes Emokpae, Courage, MD  midodrine (PROAMATINE) 10 MG tablet Take 1 tablet (10 mg total) by mouth 3 (three) times daily with meals. 08/27/20  Yes Roxan Hockey, MD  NON FORMULARY Pureed NAS diet with nectar thick liquids 08/02/20  Yes [provider]  OXYGEN Inhale 2 L into the lungs continuous. for shortness of breath to keep 02 sat above 90% 08/01/20  Yes [provider]  pantoprazole (PROTONIX) 40 MG tablet Take 1 tablet (40 mg total) by mouth daily. 06/22/19  Yes Sinda Du, MD  potassium chloride SA (KLOR-CON) 20 MEQ tablet Take 20 mEq by mouth 2 (two) times daily.   Yes [provider]  torsemide (DEMADEX) 20 MG tablet Take 20 mg by mouth 2 (two) times daily.   Yes [provider]  Vitamins A & D (VITAMIN A & D) ointment Apply A&D ointment to bilateral heels qshift. Every Shift Day, Evening, Night 08/27/20  Yes [provider]  Amino Acids-Protein Hydrolys (FEEDING SUPPLEMENT, PRO-STAT SUGAR FREE 64,) LIQD Take 30 mLs by mouth in the morning and at bedtime. 08/20/20   [provider]          The patient is critically ill with multiple organ systems failure and requires high complexity decision making for assessment and support, frequent evaluation and titration of therapies, application of advanced monitoring technologies and extensive interpretation of multiple databases. Critical Care Time devoted to patient care services described in this note is 45 minutes.    Christinia Gully, MD Pulmonary and Critical  [place] Cell [phone]   After 7:00 pm call Elink  [phone]

## 2020-10-02 NOTE — ED Notes (Signed)
Date and time results received: 10/02/20 1:06 PM  Test: Lactic  Critical Value: 3.9  Name of Provider Notified: [place]  Orders Received? Or Actions Taken?: None yet

## 2020-10-02 NOTE — ED Notes (Signed)
Dr. Melvyn Novas paged at this time to DR. Zacowski Tawnie Ehresman

## 2020-10-03 ENCOUNTER — Inpatient Hospital Stay (HOSPITAL_COMMUNITY): Payer: Medicare Other

## 2020-10-03 DIAGNOSIS — I959 Hypotension, unspecified: Secondary | ICD-10-CM

## 2020-10-03 DIAGNOSIS — R296 Repeated falls: Secondary | ICD-10-CM

## 2020-10-03 DIAGNOSIS — K219 Gastro-esophageal reflux disease without esophagitis: Secondary | ICD-10-CM

## 2020-10-03 DIAGNOSIS — E872 Acidosis: Secondary | ICD-10-CM

## 2020-10-03 DIAGNOSIS — I2510 Atherosclerotic heart disease of native coronary artery without angina pectoris: Secondary | ICD-10-CM

## 2020-10-03 LAB — CBC WITH DIFFERENTIAL/PLATELET
Abs Immature Granulocytes: 0.13 10*3/uL — ABNORMAL HIGH (ref 0.00–0.07)
Basophils Absolute: 0.1 10*3/uL (ref 0.0–0.1)
Basophils Relative: 1 %
Eosinophils Absolute: 0.4 10*3/uL (ref 0.0–0.5)
Eosinophils Relative: 4 %
HCT: 50.1 % (ref 39.0–52.0)
Hemoglobin: 14.4 g/dL (ref 13.0–17.0)
Immature Granulocytes: 1 %
Lymphocytes Relative: 16 %
Lymphs Abs: 1.9 10*3/uL (ref 0.7–4.0)
MCH: 29.1 pg (ref 26.0–34.0)
MCHC: 28.7 g/dL — ABNORMAL LOW (ref 30.0–36.0)
MCV: 101.2 fL — ABNORMAL HIGH (ref 80.0–100.0)
Monocytes Absolute: 0.7 10*3/uL (ref 0.1–1.0)
Monocytes Relative: 6 %
Neutro Abs: 8.8 10*3/uL — ABNORMAL HIGH (ref 1.7–7.7)
Neutrophils Relative %: 72 %
Platelets: 224 10*3/uL (ref 150–400)
RBC: 4.95 MIL/uL (ref 4.22–5.81)
RDW: 17.9 % — ABNORMAL HIGH (ref 11.5–15.5)
WBC: 12.1 10*3/uL — ABNORMAL HIGH (ref 4.0–10.5)
nRBC: 0.3 % — ABNORMAL HIGH (ref 0.0–0.2)

## 2020-10-03 LAB — COMPREHENSIVE METABOLIC PANEL
ALT: 46 U/L — ABNORMAL HIGH (ref 0–44)
AST: 70 U/L — ABNORMAL HIGH (ref 15–41)
Albumin: 2.8 g/dL — ABNORMAL LOW (ref 3.5–5.0)
Alkaline Phosphatase: 102 U/L (ref 38–126)
Anion gap: 10 (ref 5–15)
BUN: 82 mg/dL — ABNORMAL HIGH (ref 8–23)
CO2: 21 mmol/L — ABNORMAL LOW (ref 22–32)
Calcium: 8.4 mg/dL — ABNORMAL LOW (ref 8.9–10.3)
Chloride: 118 mmol/L — ABNORMAL HIGH (ref 98–111)
Creatinine, Ser: 1.73 mg/dL — ABNORMAL HIGH (ref 0.61–1.24)
GFR, Estimated: 38 mL/min — ABNORMAL LOW (ref >60)
Glucose, Bld: 105 mg/dL — ABNORMAL HIGH (ref 70–99)
Potassium: 4.2 mmol/L (ref 3.5–5.1)
Sodium: 149 mmol/L — ABNORMAL HIGH (ref 135–145)
Total Bilirubin: 0.7 mg/dL (ref 0.3–1.2)
Total Protein: 5.6 g/dL — ABNORMAL LOW (ref 6.5–8.1)

## 2020-10-03 LAB — LACTIC ACID, PLASMA: Lactic Acid, Venous: 1.9 mmol/L (ref 0.5–1.9)

## 2020-10-03 LAB — PROCALCITONIN: Procalcitonin: 0.24 ng/mL

## 2020-10-03 LAB — MAGNESIUM: Magnesium: 2.1 mg/dL (ref 1.7–2.4)

## 2020-10-03 MED ORDER — NOREPINEPHRINE 4 MG/250ML-% IV SOLN: 2.0000 ug/min | INTRAVENOUS | Status: DC

## 2020-10-03 MED ORDER — LACTATED RINGERS IV BOLUS
1000.0000 mL | Freq: Once | INTRAVENOUS | Status: AC
  Administered 2020-10-03: 1000 mL via INTRAVENOUS

## 2020-10-03 MED ORDER — SODIUM CHLORIDE 0.9 % IV SOLN: 250.0000 mL | INTRAVENOUS | Status: DC

## 2020-10-03 MED ORDER — SODIUM CHLORIDE 0.9 % IV BOLUS
500.0000 mL | Freq: Once | INTRAVENOUS | Status: AC
  Administered 2020-10-04: 500 mL via INTRAVENOUS

## 2020-10-03 MED ORDER — PHENYLEPHRINE HCL-NACL 10-0.9 MG/250ML-% IV SOLN
25.0000 ug/min | INTRAVENOUS | Status: DC
  Administered 2020-10-03: 75 ug/min via INTRAVENOUS
  Administered 2020-10-03: 25 ug/min via INTRAVENOUS
  Filled 2020-10-03 (×2): qty 250

## 2020-10-03 NOTE — Progress Notes (Addendum)
TRH night shift ICU coverage note.  The nursing staff reports that the patient is currently hypotensive with a BP of 72/40 mmHg.  He has been tachycardic in the 120s.  He is occasionally tachypneic respiratory rate in the mid 20s.  He has maintained good O2 sat ranging from 93 to 98% on room air.  He is currently somnolent, but he is easy to awake.  His oral mucous membranes and lips are dry.  On auscultation, the's decreased breath sounds at bases, but otherwise CTA.  S1-S2 tachycardic, cardiovascular exam without evidence of acute pitting edema.  The last 2 lactic acid levels have been 3.8 mmol/L.  He has so far received 4 L of fluid bolus and is currently getting dextrose 5% 125 mL/h.  Assessment: Hypotension Lactic acidosis Hypernatremia Hyperkalemia Plan: LR bolus 1000 mL now. Check CBG every 6 hours. Increase dextrose 5% rate if glucose is low or there is no significant hyperglycemia. Recheck lactic acid with morning labs Follow-up sodium, potassium and renal function.  Tennis Must, MD  [phone] Addendum: The patient received another 2000 mL of LR bolus, but unfortunately had to be started on a phenylephrine continuous infusion due to persistent hypotension that did not respond to fluids.  His urine output has only been 250 mL during the shift.  Tennis Must, MD

## 2020-10-03 NOTE — Progress Notes (Addendum)
PROGRESS NOTE   Eric Bowman  FWY:[id number] DOB: April 20, 1933 DOA: 10/02/2020 PCP: Gerlene Fee, NP   Chief Complaint  Patient presents with  . Hypotension    Brief Admission History:  86 y.o. male long term resident of [facility] with medical history significant for OSA, gout, CAD, diastolic CHF, GERD, hyperlipidemia, Hypertension, atrial fibrillation on apixaban, on torsemide for CHF, has dementia and apparently has not been eating and drinking well for the past several days.  He was noted to be more lethargic and was seen by the NP at the SNF and labs were drawn.  He was hypotensive at the time.  His labs revealed a severe hypernatremia, hyperkalemia and acute kidney injury. He was sent to the emergency department for further evaluation and treatments.    He was tachycardic on arrival 100.8, pulse 147, BP 78/61, 99% on room air.  His labs revealed sodium of 161, potassium 6.4, BUN 105 creatinine 2.16, anion gap 17, CO2 26, glucose 116, AST 60, ALT 38, total bilirubin 1.4, estimated GFR 29, BNP 726, WBC 10.6, hemoglobin 17.4, hematocrit 59.4, INR 2.9, lactic acid 3.9, SARS 2 coronavirus test negative, influenza A and influenza B tests negative.  Urinalysis no infection seen, MRSA screen negative.  Chest x-Rachael with persistent right-sided atelectasis.  Cardiomegaly seen on chest x-Mandell.  PCCM was consulted and she was evaluated by Dr. Melvyn Novas.  He discussed the case fully with wife at bedside and she was made aware of how critically ill patient was at this time.  She elected a trial of treating with fluids and antibiotics to see if he will have a meaningful recovery.  No pressors or central line. He is to remain DNR status.   Assessment & Plan:   Active Problems:   Hyperkalemia   Obstructive sleep apnea on CPAP   GERD   Coronary artery disease   Multiple falls   Dyslipidemia   Vascular dementia without behavioral disturbance (HCC)   Chronic gout without tophus   Physical deconditioning    Pleural effusion   History of sarcoidosis   Atrial fibrillation with rapid ventricular response (HCC)   Hypotension   COPD with hypoxia (HCC)   Dysphagia   Chronic diastolic (congestive) heart failure (HCC)   Acute renal failure (HCC)   Hypernatremia   Hypertensive heart disease with chronic diastolic congestive heart failure (HCC)   Shock circulatory (HCC)   Severe dehydration   Lactic acidosis   DNR (do not resuscitate)   1. Severe sepsis with circulatory Shock - Continue empiric antibiotic therapy pending cultures and further work up.  CXR suggesting pneumonia.  Lactic acidosis resolved.  Confirmed with wife, no pressors, no central line. Continue IV fluid hydration.  Discussed with wife at bedside that prognosis is guarded and that patient may not be able to recover from this illness. She verbalized understanding.  2. Severe dehydration - Pt has received 7L fluids in ED, continue to hydrate as tolerated. Monitoring urine output.  Holding diuretics.  3. Hypernatremia - improving with free water maintenance IV fluids.  4. Atrial fibrillation with RVR - his HR has been improving with hydration, not able to give cardizem or metoprolol now due to hypotension.  5. AKI - prerenal, improving with IV fluid hydration. Following renal function.  6. Hyperkalemia - Resolved. Treated with lokelma.  7. Hypotension - from sepsis and severe dehydration, He is being aggressively hydrated.  No pressors or central line per family wishes.  Restarted home midodrine.  8. Lactic  acidosis - resolved now.   9. OSA - will offer CPAP therapy in stepdown ICU.  10. Dysphagia - dys diet ordered.  11. Chronic diastolic heart failure - holding diuretics in setting of severe dehydration.  12. DNR - per wife, ok to give fluids and antibiotics, no pressors or central lines.  Reassess after these treatments to see if there is going to be any meaningful recovery.    DVT prophylaxis: SQ heparin  Code Status: DNR    Family Communication: wife at bedside   Disposition Plan: TBD  Consults called: intensivist PCCM  Admission status: INP   Status is: Inpatient  Remains inpatient appropriate because:Hemodynamically unstable  Dispo: The patient is from: SNF              Anticipated d/c is to: SNF              Anticipated d/c date is: > 3 days              Patient currently is not medically stable to d/c.   Consultants:   PCCM  Procedures:     Antimicrobials:  cefepime 12/3>> Vancomycin 12/2  Subjective: Pt says he felt a little better today.  He wants to try to eat something.  Objective: Vitals:   10/03/20 0400 10/03/20 0500 10/03/20 0600 10/03/20 0744  BP: 102/79 98/71 94/71    Pulse: (!) 54 85 90   Resp: 19 (!) 25 (!) 39   Temp: (!) 96.2 F (35.7 C)   (!) 96.6 F (35.9 C)  TempSrc: Axillary   Axillary  SpO2: 98% 98% 97%   Weight:      Height:        Intake/Output Summary (Last 24 hours) at 10/03/2020 1610 Last data filed at 10/03/2020 0700 Gross per 24 hour  Intake 6728.62 ml  Output 650 ml  Net 6078.62 ml   Filed Weights   10/02/20 1141 10/02/20 1644  Weight: 79.4 kg 83.2 kg    Examination:   General exam: chronically ill appearing male, Appears calm and comfortable  Respiratory system: Clear to auscultation. Respiratory effort normal. Cardiovascular system: normal S1 & S2 heard. No JVD, murmurs, rubs, gallops or clicks. No pedal edema.  Gastrointestinal system: Abdomen is nondistended, soft and nontender. No organomegaly or masses felt. Normal bowel sounds heard. Central nervous system: Alert and oriented. No focal neurological deficits. Extremities: Symmetric 5 x 5 power. Skin: No rashes, lesions or ulcers Psychiatry: Judgement and insight appear normal. Mood & affect appropriate.   Data Reviewed: I have personally reviewed following labs and imaging studies  CBC: Recent Labs  Lab 10/02/20 1019 10/03/20 0415  WBC 10.6* 12.1*  NEUTROABS  --  8.8*  HGB  17.4* 14.4  HCT 59.4* 50.1  MCV 98.0 101.2*  PLT 295 960    Basic Metabolic Panel: Recent Labs  Lab 10/02/20 1019 10/02/20 1207 10/03/20 0415  NA 161*  --  149*  K 6.4* 6.0* 4.2  CL 118*  --  118*  CO2 26  --  21*  GLUCOSE 116*  --  105*  BUN 105*  --  82*  CREATININE 2.16*  --  1.73*  CALCIUM 10.2  --  8.4*  MG  --   --  2.1    GFR: Estimated Creatinine Clearance: 32.2 mL/min (A) (by C-G formula based on SCr of 1.73 mg/dL (H)).  Liver Function Tests: Recent Labs  Lab 10/02/20 1019 10/03/20 0415  AST 60* 70*  ALT 38 46*  ALKPHOS  130* 102  BILITOT 1.4* 0.7  PROT 7.7 5.6*  ALBUMIN 3.8 2.8*    CBG: No results for input(s): GLUCAP in the last 168 hours.  Recent Results (from the past 240 hour(s))  Blood Culture (routine x 2)     Status: None (Preliminary result)   Collection Time: 10/02/20 12:20 PM   Specimen: BLOOD RIGHT WRIST  Result Value Ref Range Status   Specimen Description BLOOD RIGHT WRIST DRAWN BY RN  Final   Special Requests   Final    BOTTLES DRAWN AEROBIC AND ANAEROBIC Blood Culture adequate volume   Culture   Final    NO GROWTH < 24 HOURS Performed at [facility], [address] [place], [place] [address]    Report Status PENDING  Incomplete  Blood Culture (routine x 2)     Status: None (Preliminary result)   Collection Time: 10/02/20 12:25 PM   Specimen: BLOOD LEFT ARM  Result Value Ref Range Status   Specimen Description BLOOD LEFT ARM DRAWN BY RN  Final   Special Requests   Final    BOTTLES DRAWN AEROBIC AND ANAEROBIC Blood Culture adequate volume   Culture   Final    NO GROWTH < 24 HOURS Performed at [facility], [address] [place], [place] [address]    Report Status PENDING  Incomplete  Resp Panel by RT-PCR (Flu A&B, Covid) Nasopharyngeal Swab     Status: None   Collection Time: 10/02/20 12:46 PM   Specimen: Nasopharyngeal Swab; Nasopharyngeal(NP) swabs in vial transport medium  Result Value Ref Range Status   SARS  Coronavirus 2 by RT PCR NEGATIVE NEGATIVE Final    Comment: (NOTE) SARS-CoV-2 target nucleic acids are NOT DETECTED.  The SARS-CoV-2 RNA is generally detectable in upper respiratory specimens during the acute phase of infection. The lowest concentration of SARS-CoV-2 viral copies this assay can detect is 138 copies/mL. A negative result does not preclude SARS-Cov-2 infection and should not be used as the sole basis for treatment or other patient management decisions. A negative result may occur with  improper specimen collection/handling, submission of specimen other than nasopharyngeal swab, presence of viral mutation(s) within the areas targeted by this assay, and inadequate number of viral copies(<138 copies/mL). A negative result must be combined with clinical observations, patient history, and epidemiological information. The expected result is Negative.  Fact Sheet for Patients:  EntrepreneurPulse.com.au  Fact Sheet for Healthcare Providers:  IncredibleEmployment.be  This test is no t yet approved or cleared by the [place] FDA and  has been authorized for detection and/or diagnosis of SARS-CoV-2 by FDA under an Emergency Use Authorization (EUA). This EUA will remain  in effect (meaning this test can be used) for the duration of the COVID-19 declaration under Section 564(b)(1) of the Act, 21 U.S.C.section 360bbb-3(b)(1), unless the authorization is terminated  or revoked sooner.       Influenza A by PCR NEGATIVE NEGATIVE Final   Influenza B by PCR NEGATIVE NEGATIVE Final    Comment: (NOTE) The Xpert Xpress SARS-CoV-2/FLU/RSV plus assay is intended as an aid in the diagnosis of influenza from Nasopharyngeal swab specimens and should not be used as a sole basis for treatment. Nasal washings and aspirates are unacceptable for Xpert Xpress SARS-CoV-2/FLU/RSV testing.  Fact Sheet for  Patients: EntrepreneurPulse.com.au  Fact Sheet for Healthcare Providers: IncredibleEmployment.be  This test is not yet approved or cleared by the [place] FDA and has been authorized for detection and/or diagnosis of SARS-CoV-2 by FDA under  an Emergency Use Authorization (EUA). This EUA will remain in effect (meaning this test can be used) for the duration of the COVID-19 declaration under Section 564(b)(1) of the Act, 21 U.S.C. section 360bbb-3(b)(1), unless the authorization is terminated or revoked.  Performed at [facility], [address], [place], [place] [address]   MRSA PCR Screening     Status: None   Collection Time: 10/02/20 12:58 PM   Specimen: Nasopharyngeal Swab  Result Value Ref Range Status   MRSA by PCR NEGATIVE NEGATIVE Final    Comment:        The GeneXpert MRSA Assay (FDA approved for NASAL specimens only), is one component of a comprehensive MRSA colonization surveillance program. It is not intended to diagnose MRSA infection nor to guide or monitor treatment for MRSA infections. Performed at [facility], [address] [place], [place] [address]      Radiology Studies: DG Chest 2 View  Result Date: 10/02/2020 CLINICAL DATA:  Pleural effusion. EXAM: CHEST - 2 VIEW COMPARISON:  08/27/2020.  08/26/2020. FINDINGS: Cardiomegaly. No pulmonary venous congestion. Persistent right base atelectasis/infiltrate and right-sided pleural effusion. No interim change. Nodular opacity noted over the left lung base most likely nipple shadow as it was not present on prior recent studies. Symmetric nipple opacity noted over the right chest wall. Degenerative changes lumbar spine and both shoulders. IMPRESSION: 1. Cardiomegaly. No pulmonary venous congestion. 2. Persistent right base atelectasis/infiltrate and right-sided pleural effusion. No interim change. No pneumothorax. Electronically Signed   By: Marcello Moores  Register   On: 10/02/2020  11:05     Scheduled Meds: . allopurinol  300 mg Oral Daily  . apixaban  2.5 mg Oral BID  . atorvastatin  20 mg Oral QPM  . Chlorhexidine Gluconate Cloth  6 each Topical Daily  . donepezil  10 mg Oral QHS  . feeding supplement  237 mL Oral BID BM  . midodrine  10 mg Oral TID WC  . pantoprazole (PROTONIX) IV  40 mg Intravenous Q24H   Continuous Infusions: . sodium chloride    . ceFEPime (MAXIPIME) IV 2 g (10/03/20 0102)  . dextrose 125 mL/hr at 10/03/20 0050     LOS: 1 day   Critical Care Procedure Note Authorized and Performed by: Murvin Natal MD  Total Critical Care time:  38 mins  Due to a high probability of clinically significant, life threatening deterioration, the patient required my highest level of preparedness to intervene emergently and I personally spent this critical care time directly and personally managing the patient.  This critical care time included obtaining a history; examining the patient, pulse oximetry; ordering and review of studies; arranging urgent treatment with development of a management plan; evaluation of patient's response of treatment; frequent reassessment; and discussions with other providers.  This critical care time was performed to assess and manage the high probability of imminent and life threatening deterioration that could result in multi-organ failure.  It was exclusive of separately billable procedures and treating other patients and teaching time.    Irwin Brakeman, MD How to contact the [facility] Attending or Consulting provider [place] or covering provider during after hours [place], for this patient?  1. Check the care team in [facility] and look for a) attending/consulting TR[facility] provider listed and b) the [facility] team listed 2. Log into [email or web address] and use [place]'s universal password to access. If you do not have the password, please contact the hospital operator. 3. Locate the [facility] provider you are looking  for under [facility] and page to a  number that you can be directly reached. 4. If you still have difficulty reaching the provider, please page the [facility] (Director on Call) for the Hospitalists listed on amion for assistance.  10/03/2020, 9:37 AM

## 2020-10-03 NOTE — Progress Notes (Signed)
Patient still declining CPAP use.

## 2020-10-03 NOTE — Progress Notes (Signed)
Pt having soft  Bps. MD made aware. MD to  Bedside to assess pt. New orders placed and completed. Will continue to monitor pt.

## 2020-10-03 NOTE — Progress Notes (Signed)
TRH night shift.  The patient was on phenylephrine infusion for about 4 hours during an episode of hypotension associated with lactic acidosis that did not respond to IVF boluses.  I have discontinued it since his wife expressed to Dr. Wynetta Emery that she did not wish to treat beyond IVF and IV antibiotics with no pressors or central line placement.  Tennis Must, MD

## 2020-10-03 NOTE — Progress Notes (Signed)
Initial Nutrition Assessment  DOCUMENTATION CODES:   Not applicable  INTERVENTION:  Continue Ensure Enlive po BID, each supplement provides 350 kcal and 20 grams of protein  Continue to monitor for adequacy of intake, will provide additional ONS as appropriate  NUTRITION DIAGNOSIS:   Increased nutrient needs related to acute illness (severe sepsis with circualtory shock) as evidenced by estimated needs.    GOAL:   Patient will meet greater than or equal to 90% of their needs    MONITOR:   Labs, I & O's, Supplement acceptance, PO intake, Weight trends, Skin  REASON FOR ASSESSMENT:   Malnutrition Screening Tool    ASSESSMENT:  RD working remotely.  86 year old male who is a long term resident of [facility] admitted with severe sepsis with circulatory shock and severe dehydration. Past medical history significant of dementia, OSA, gout, CAD, chronic dCHF, GERD, HLD, HTN, and atrial fibrillation.  Patient noted to have not been eating/drinking well and has been more lethargic over the past several days at facility. He was seen by NP at SNF, noted hypotensive at that time, labs drawn and sent to emergency department for further evaluation. Pt was tachycardic on arrival, labs revealed sodium of 161, potassium 6.4, BUN 105, and Cr 2.16. CXR with persistent right-sided atelectasis and cardiomegaly. PCCM consulted, plans for trial of fluids and antibiotics, prognosis remains guarded.   He is receiving a mechanically altered dysphagia 2 diet, per flowsheet he consumed 75% of breakfast tray and accepting of Ensure supplement this morning. Per chart, weights have trended down ~19 lbs (9.5%) in the last month which is significant for time frame. Given this as well as advanced age with history significant of dementia, CHF, and COPD, highly suspect malnutrition, however unable to identify at this time. Will plan to perform exam at follow-up. He is ordered Ensure supplement BID per medication  review as well as receiving additional calories from dextrose. Will continue Ensure supplements and monitor for adequacy of intake.   I/Os: +6078.6 ml since admit UOP: 650 ml x 24 hrs  Medications reviewed and include: Aricept, Protonix, Zyloprim, Maxipime  D5 @ 125 ml/hr (510 kcal)  Labs: Na 149 (H) trending down, K 4.2 (WNL) s/p Lokelma, BUN 82 (H) trending down Cr 1.73 (H) trending down, Mg 1.2 (WNL), WBC 12.1 (H), BNP 726 (H)  Per notes: -continue empiric antibiotic therapy pending cultures and further work up -lactic acidosis resolved -hypotension, no pressors or central line per family request, continue hydration as tolerated  -s/p 7 L in ED, holding diuretics -HR improving with hydration -hypernatremia improving, follow renal function -hyperkalemia resolved   NUTRITION - FOCUSED PHYSICAL EXAM: Unable to complete at this time, RD working remotely.  Mild pitting BLE edema per flowsheet  Diet Order:   Diet Order            DIET DYS 2 Room service appropriate? Yes; Fluid consistency: Thin  Diet effective now                 EDUCATION NEEDS:   No education needs have been identified at this time  Skin:  Skin Assessment: Skin Integrity Issues: Skin Integrity Issues:: Unstageable Unstageable: left ankle  Last BM:  12/3 type 5  Height:   Ht Readings from Last 1 Encounters:  10/02/20 5\' 10"  (1.778 m)    Weight:   Wt Readings from Last 1 Encounters:  10/02/20 83.2 kg    BMI:  Body mass index is 26.32 kg/m.  Estimated  Nutritional Needs:   Kcal:  2000-2200  Protein:  92-105  Fluid:  >/= 2 L/day   Lajuan Lines, RD, LDN Clinical Nutrition After Hours/Weekend Pager # in [address]

## 2020-10-04 ENCOUNTER — Inpatient Hospital Stay (HOSPITAL_COMMUNITY): Payer: Medicare Other

## 2020-10-04 DIAGNOSIS — R6521 Severe sepsis with septic shock: Secondary | ICD-10-CM

## 2020-10-04 DIAGNOSIS — A419 Sepsis, unspecified organism: Secondary | ICD-10-CM

## 2020-10-04 LAB — COMPREHENSIVE METABOLIC PANEL
ALT: 34 U/L (ref 0–44)
AST: 40 U/L (ref 15–41)
Albumin: 2.3 g/dL — ABNORMAL LOW (ref 3.5–5.0)
Alkaline Phosphatase: 85 U/L (ref 38–126)
Anion gap: 9 (ref 5–15)
BUN: 64 mg/dL — ABNORMAL HIGH (ref 8–23)
CO2: 19 mmol/L — ABNORMAL LOW (ref 22–32)
Calcium: 8.2 mg/dL — ABNORMAL LOW (ref 8.9–10.3)
Chloride: 114 mmol/L — ABNORMAL HIGH (ref 98–111)
Creatinine, Ser: 1.3 mg/dL — ABNORMAL HIGH (ref 0.61–1.24)
GFR, Estimated: 54 mL/min — ABNORMAL LOW (ref >60)
Glucose, Bld: 131 mg/dL — ABNORMAL HIGH (ref 70–99)
Potassium: 3.5 mmol/L (ref 3.5–5.1)
Sodium: 142 mmol/L (ref 135–145)
Total Bilirubin: 0.6 mg/dL (ref 0.3–1.2)
Total Protein: 4.8 g/dL — ABNORMAL LOW (ref 6.5–8.1)

## 2020-10-04 LAB — MAGNESIUM: Magnesium: 1.7 mg/dL (ref 1.7–2.4)

## 2020-10-04 LAB — PROCALCITONIN: Procalcitonin: 0.2 ng/mL

## 2020-10-04 LAB — CBC WITH DIFFERENTIAL/PLATELET
Abs Immature Granulocytes: 0.03 10*3/uL (ref 0.00–0.07)
Basophils Absolute: 0 10*3/uL (ref 0.0–0.1)
Basophils Relative: 1 %
Eosinophils Absolute: 0.6 10*3/uL — ABNORMAL HIGH (ref 0.0–0.5)
Eosinophils Relative: 7 %
HCT: 42.7 % (ref 39.0–52.0)
Hemoglobin: 12.6 g/dL — ABNORMAL LOW (ref 13.0–17.0)
Immature Granulocytes: 0 %
Lymphocytes Relative: 9 %
Lymphs Abs: 0.8 10*3/uL (ref 0.7–4.0)
MCH: 28.6 pg (ref 26.0–34.0)
MCHC: 29.5 g/dL — ABNORMAL LOW (ref 30.0–36.0)
MCV: 96.8 fL (ref 80.0–100.0)
Monocytes Absolute: 0.6 10*3/uL (ref 0.1–1.0)
Monocytes Relative: 7 %
Neutro Abs: 6.5 10*3/uL (ref 1.7–7.7)
Neutrophils Relative %: 76 %
Platelets: 172 10*3/uL (ref 150–400)
RBC: 4.41 MIL/uL (ref 4.22–5.81)
RDW: 17.1 % — ABNORMAL HIGH (ref 11.5–15.5)
WBC: 8.6 10*3/uL (ref 4.0–10.5)
nRBC: 0.4 % — ABNORMAL HIGH (ref 0.0–0.2)

## 2020-10-04 MED ORDER — AMIODARONE LOAD VIA INFUSION
150.0000 mg | Freq: Once | INTRAVENOUS | Status: AC
  Administered 2020-10-04: 150 mg via INTRAVENOUS
  Filled 2020-10-04: qty 83.34

## 2020-10-04 MED ORDER — LACTATED RINGERS IV SOLN: INTRAVENOUS | Status: DC

## 2020-10-04 MED ORDER — AMIODARONE HCL IN DEXTROSE 360-4.14 MG/200ML-% IV SOLN
30.0000 mg/h | INTRAVENOUS | Status: DC
  Administered 2020-10-04 – 2020-10-06 (×5): 30 mg/h via INTRAVENOUS
  Filled 2020-10-04 (×8): qty 200

## 2020-10-04 MED ORDER — AMIODARONE HCL IN DEXTROSE 360-4.14 MG/200ML-% IV SOLN
60.0000 mg/h | INTRAVENOUS | Status: AC
  Administered 2020-10-04: 60 mg/h via INTRAVENOUS
  Filled 2020-10-04 (×2): qty 200

## 2020-10-04 MED ORDER — MAGNESIUM SULFATE 2 GM/50ML IV SOLN
2.0000 g | Freq: Once | INTRAVENOUS | Status: AC
  Administered 2020-10-04: 2 g via INTRAVENOUS
  Filled 2020-10-04: qty 50

## 2020-10-04 NOTE — Progress Notes (Signed)
Nurse notified MD pt's HR is increasing to mostly 130-140 with occasionally in 150-160s afib, BP 85/49 MAP 60, orders obtained to start an amio gtt.

## 2020-10-04 NOTE — Progress Notes (Addendum)
PROGRESS NOTE  Eric Bowman ZOX:[id number] DOB: [birth year] DOA: 10/02/2020 PCP: Gerlene Fee, NP  Brief History:  86 [facility] significant[facility], gout, CAD, diastolic CHF, GERD, hyperlipidemia, Hypertension, atrial fibrillation on apixaban, on torsemide for CHF, has dementia and apparently has not been eating and drinking well for the past several days. He was noted to be more lethargic and was seen by the NP at the SNF and labs were drawn. He was hypotensive at the time. His labs revealed a severe hypernatremia, hyperkalemia and acute kidney injury. He was sent to the emergency department for further evaluation and treatments.   He was tachycardic on arrival 100.8, pulse 147, BP 78/61, 99% on room air.His labs revealed sodium of 161, potassium 6.4, BUN 105 creatinine 2.16, anion gap 17, CO2 26, glucose 116, AST 60, ALT 38, total bilirubin 1.4, estimated GFR 29, BNP 726, WBC 10.6, hemoglobin 17.4, hematocrit 59.4, INR 2.9, lactic acid 3.9, SARS 2 coronavirus test negative, influenza A and influenza B tests negative. Urinalysis no infection seen, MRSA screen negative. Chest x-[place]ren with persistent right-sided atelectasis. Cardiomegaly seen on chest x-[place]. PCCM was consulted and she was evaluated by Dr. Melvyn Novas. He discussed the case fully with wife at bedside and she was made aware of how critically ill patient was at this time. She elected a trial of treating with fluids and antibiotics to see if he will have a meaningful recovery. No pressors or central line. He is to remain DNR status.   Assessment/Plan: Septic shock -Secondary to pneumonia -Lactic acid peaked at 3.8 -PCT 0.11>> 0.24>> 0.20 -Continue empiric cefepime -Personally reviewed chest x-[place]--right lower lobe opacity -Blood and urine cultures--negative to date -Change fluids to LR  Atrial fibrillation with RVR -HR 110-120 -If no improvement or worsening,  plan to start amiodarone -Personally reviewed EKG--atrial fibrillation RVR nonspecific ST changes -Anticipate improvement with treatment of sepsis -Continue apixaban -Holding Cardizem CD secondary to hypotension  Hypernatremia -Presented with sodium 161 -Improved with hypotonic fluid  Chronic diastolic CHF -Appears clinically euvolemic -Holding metoprolol succinate secondary to hypotension -Holding torsemide temporarily -07/27/20 Echo--EF 60-65%, mild decrease RV function; trivial pericardial effusion  Acute kidney injury -Baseline creatinine 0.7-0.9 -Presented with serum creatinine 2.16 -Continue IV fluids  Hyperkalemia -Initially treated with Lokelma -Overall improved  Hyperlipidemia -Continue statin  Dementia without behavioral disturbance -Continue Aricept      Status is: Inpatient  Remains inpatient appropriate because:IV treatments appropriate due to intensity of illness or inability to take PO   Dispo: The patient is from: SNF              Anticipated d/c is to: SNF              Anticipated d/c date is: 2 days              Patient currently is not medically stable to d/c.     The patient is critically ill with multiple organ systems failure and requires high complexity decision making for assessment and support, frequent evaluation and titration of therapies, application of advanced monitoring technologies and extensive interpretation of multiple databases.  Critical care time - 35 mins.     Family Communication:  Attempted to call spouse 12/5, unable to leave VM  Consultants:  none  Code Status:  DNR  DVT Prophylaxis: apixaban   Procedures: As Listed in Progress Note Above  Antibiotics: Cefepime 12/3>> vanco 21/3  Subjective: Patient denies fevers, chills, headache, chest pain, dyspnea, nausea, vomiting, diarrhea, abdominal pain, dysuria, hematuria, hematochezia, and melena.   Objective: Vitals:   10/04/20 0700 10/04/20 0724  10/04/20 0800 10/04/20 0900  BP: (!) 82/47  (!) 104/53 (!) 85/70  Pulse: 71 (!) 135 (!) 113 (!) 116  Resp: (!) 21 (!) 24 (!) 25 (!) 29  Temp:  (!) 97.4 F (36.3 C)    TempSrc:  Oral    SpO2: 98% 97% 96% 98%  Weight:      Height:        Intake/Output Summary (Last 24 hours) at 10/04/2020 1000 Last data filed at 10/04/2020 0900 Gross per 24 hour  Intake 5446.67 ml  Output 2125 ml  Net 3321.67 ml   Weight change:  Exam:   General:  Pt is alert, follows commands appropriately, not in acute distress  HEENT: No icterus, No thrush, No neck mass, [place]/AT  Cardiovascular: RRR, S1/S2, no rubs, no gallops  Respiratory: bibasilar rales. No wheeze  Abdomen: Soft/+BS, non tender, non distended, no guarding  Extremities:trace LE edema, No lymphangitis, No petechiae, No rashes, no synovitis   Data Reviewed: I have personally reviewed following labs and imaging studies Basic Metabolic Panel: Recent Labs  Lab 10/02/20 1019 10/02/20 1207 10/03/20 0415 10/04/20 0316  NA 161*  --  149* 142  K 6.4* 6.0* 4.2 3.5  CL 118*  --  118* 114*  CO2 26  --  21* 19*  GLUCOSE 116*  --  105* 131*  BUN 105*  --  82* 64*  CREATININE 2.16*  --  1.73* 1.30*  CALCIUM 10.2  --  8.4* 8.2*  MG  --   --  2.1 1.7   Liver Function Tests: Recent Labs  Lab 10/02/20 1019 10/03/20 0415 10/04/20 0316  AST 60* 70* 40  ALT 38 46* 34  ALKPHOS 130* 102 85  BILITOT 1.4* 0.7 0.6  PROT 7.7 5.6* 4.8*  ALBUMIN 3.8 2.8* 2.3*   No results for input(s): LIPASE, AMYLASE in the last 168 hours. No results for input(s): AMMONIA in the last 168 hours. Coagulation Profile: Recent Labs  Lab 10/02/20 1207  INR 2.9*   CBC: Recent Labs  Lab 10/02/20 1019 10/03/20 0415 10/04/20 0316  WBC 10.6* 12.1* 8.6  NEUTROABS  --  8.8* 6.5  HGB 17.4* 14.4 12.6*  HCT 59.4* 50.1 42.7  MCV 98.0 101.2* 96.8  PLT 295 224 172   Cardiac Enzymes: No results for input(s): CKTOTAL, CKMB, CKMBINDEX, TROPONINI in the last 168  hours. BNP: Invalid input(s): POCBNP CBG: No results for input(s): GLUCAP in the last 168 hours. HbA1C: No results for input(s): HGBA1C in the last 72 hours. Urine analysis:    Component Value Date/Time   COLORURINE YELLOW 10/02/2020 1155   APPEARANCEUR CLEAR 10/02/2020 1155   LABSPEC 1.012 10/02/2020 1155   PHURINE 5.0 10/02/2020 1155   GLUCOSEU NEGATIVE 10/02/2020 1155   HGBUR NEGATIVE 10/02/2020 1155   [place] 10/02/2020 1155   [place] 10/02/2020 1155   PROTEINUR NEGATIVE 10/02/2020 1155   NITRITE NEGATIVE 10/02/2020 1155   LEUKOCYTESUR NEGATIVE 10/02/2020 1155   Sepsis Labs: @LABRCNTIP (procalcitonin:4,lacticidven:4) ) Recent Results (from the past 240 hour(s))  Blood Culture (routine x 2)     Status: None (Preliminary result)   Collection Time: 10/02/20 12:20 PM   Specimen: BLOOD RIGHT WRIST  Result Value Ref Range Status   Specimen Description BLOOD RIGHT WRIST DRAWN BY RN  Final   Special Requests  Final    BOTTLES DRAWN AEROBIC AND ANAEROBIC Blood Culture adequate volume   Culture   Final    NO GROWTH < 24 HOURS Performed at [facility], [address] [place], [place] [address]    Report Status PENDING  Incomplete  Blood Culture (routine x 2)     Status: None (Preliminary result)   Collection Time: 10/02/20 12:25 PM   Specimen: BLOOD LEFT ARM  Result Value Ref Range Status   Specimen Description BLOOD LEFT ARM DRAWN BY RN  Final   Special Requests   Final    BOTTLES DRAWN AEROBIC AND ANAEROBIC Blood Culture adequate volume   Culture   Final    NO GROWTH < 24 HOURS Performed at [facility], [address], [place], [place] [address]    Report Status PENDING  Incomplete  Resp Panel by RT-PCR (Flu A&B, Covid) Nasopharyngeal Swab     Status: None   Collection Time: 10/02/20 12:46 PM   Specimen: Nasopharyngeal Swab; Nasopharyngeal(NP) swabs in vial transport medium  Result Value Ref Range Status   SARS Coronavirus 2 by RT PCR NEGATIVE  NEGATIVE Final    Comment: (NOTE) SARS-CoV-2 target nucleic acids are NOT DETECTED.  The SARS-CoV-2 RNA is generally detectable in upper respiratory specimens during the acute phase of infection. The lowest concentration of SARS-CoV-2 viral copies this assay can detect is 138 copies/mL. A negative result does not preclude SARS-Cov-2 infection and should not be used as the sole basis for treatment or other patient management decisions. A negative result may occur with  improper specimen collection/handling, submission of specimen other than nasopharyngeal swab, presence of viral mutation(s) within the areas targeted by this assay, and inadequate number of viral copies(<138 copies/mL). A negative result must be combined with clinical observations, patient history, and epidemiological information. The expected result is Negative.  Fact Sheet for Patients:  EntrepreneurPulse.com.au  Fact Sheet for Healthcare Providers:  IncredibleEmployment.be  This test is no t yet approved or cleared by the [place] FDA and  has been authorized for detection and/or diagnosis of SARS-CoV-2 by FDA under an Emergency Use Authorization (EUA). This EUA will remain  in effect (meaning this test can be used) for the duration of the COVID-19 declaration under Section 564(b)(1) of the Act, 21 U.S.C.section 360bbb-3(b)(1), unless the authorization is terminated  or revoked sooner.       Influenza A by PCR NEGATIVE NEGATIVE Final   Influenza B by PCR NEGATIVE NEGATIVE Final    Comment: (NOTE) The Xpert Xpress SARS-CoV-2/FLU/RSV plus assay is intended as an aid in the diagnosis of influenza from Nasopharyngeal swab specimens and should not be used as a sole basis for treatment. Nasal washings and aspirates are unacceptable for Xpert Xpress SARS-CoV-2/FLU/RSV testing.  Fact Sheet for Patients: EntrepreneurPulse.com.au  Fact Sheet for Healthcare  Providers: IncredibleEmployment.be  This test is not yet approved or cleared by the [place] FDA and has been authorized for detection and/or diagnosis of SARS-CoV-2 by FDA under an Emergency Use Authorization (EUA). This EUA will remain in effect (meaning this test can be used) for the duration of the COVID-19 declaration under Section 564(b)(1) of the Act, 21 U.S.C. section 360bbb-3(b)(1), unless the authorization is terminated or revoked.  Performed at [facility], [address] [place], [place] [address]   MRSA PCR Screening     Status: None   Collection Time: 10/02/20 12:58 PM   Specimen: Nasopharyngeal Swab  Result Value Ref Range Status   MRSA by PCR  NEGATIVE NEGATIVE Final    Comment:        The GeneXpert MRSA Assay (FDA approved for NASAL specimens only), is one component of a comprehensive MRSA colonization surveillance program. It is not intended to diagnose MRSA infection nor to guide or monitor treatment for MRSA infections. Performed at [facility], [address] [place], [place] [address]      Scheduled Meds: . allopurinol  300 mg Oral Daily  . apixaban  2.5 mg Oral BID  . atorvastatin  20 mg Oral QPM  . Chlorhexidine Gluconate Cloth  6 each Topical Daily  . donepezil  10 mg Oral QHS  . feeding supplement  237 mL Oral BID BM  . midodrine  10 mg Oral TID WC  . pantoprazole (PROTONIX) IV  40 mg Intravenous Q24H   Continuous Infusions: . sodium chloride    . ceFEPime (MAXIPIME) IV Stopped (10/04/20 0345)  . dextrose 125 mL/hr at 10/04/20 0500    Procedures/Studies: DG Chest 2 View  Result Date: 10/02/2020 CLINICAL DATA:  Pleural effusion. EXAM: CHEST - 2 VIEW COMPARISON:  08/27/2020.  08/26/2020. FINDINGS: Cardiomegaly. No pulmonary venous congestion. Persistent right base atelectasis/infiltrate and right-sided pleural effusion. No interim change. Nodular opacity noted over the left lung base most likely nipple shadow as it was  not present on prior recent studies. Symmetric nipple opacity noted over the right chest wall. Degenerative changes lumbar spine and both shoulders. IMPRESSION: 1. Cardiomegaly. No pulmonary venous congestion. 2. Persistent right base atelectasis/infiltrate and right-sided pleural effusion. No interim change. No pneumothorax. Electronically Signed   By: Marcello Moores  Register   On: 10/02/2020 11:05   US Abdomen Complete  Result Date: 10/03/2020 CLINICAL DATA:  Elevated LFTs for 1 week EXAM: ABDOMEN ULTRASOUND COMPLETE COMPARISON:  None. FINDINGS: Gallbladder: Not well visualized Common bile duct: Not well visualized Liver: Diffusely increased in echogenicity. Focal 1 cm cyst is noted in the left lobe of the liver. No biliary ductal dilatation is seen. No other mass is noted. Portal vein is patent on color Doppler imaging with normal direction of blood flow towards the liver. IVC: No abnormality visualized. Pancreas: Visualized portion unremarkable. Spleen: Not well visualized due to overlying bowel gas Right Kidney: Not well visualized. Left Kidney: Length: 10.6 cm. 1.7 cm cyst is noted within the left kidney. No obstructive changes are seen. Abdominal aorta: No aneurysm visualized. Other findings: Small right pleural effusion is noted. IMPRESSION: Extremely limited exam due to overlying bowel gas. No acute abnormality is seen aside from a small right pleural effusion. Hepatic and renal cysts. Electronically Signed   By: Inez Catalina M.D.   On: 10/03/2020 10:15    Orson Eva, DO  [facility]  If 7PM-7AM, please contact night-coverage [email or web address] Password [email or web address] 10/04/2020, 10:00 AM   LOS: 2 days

## 2020-10-05 DIAGNOSIS — R6521 Severe sepsis with septic shock: Secondary | ICD-10-CM

## 2020-10-05 LAB — COMPREHENSIVE METABOLIC PANEL
ALT: 32 U/L (ref 0–44)
AST: 31 U/L (ref 15–41)
Albumin: 2.3 g/dL — ABNORMAL LOW (ref 3.5–5.0)
Alkaline Phosphatase: 88 U/L (ref 38–126)
Anion gap: 6 (ref 5–15)
BUN: 45 mg/dL — ABNORMAL HIGH (ref 8–23)
CO2: 21 mmol/L — ABNORMAL LOW (ref 22–32)
Calcium: 8.3 mg/dL — ABNORMAL LOW (ref 8.9–10.3)
Chloride: 115 mmol/L — ABNORMAL HIGH (ref 98–111)
Creatinine, Ser: 1.1 mg/dL (ref 0.61–1.24)
GFR, Estimated: >60 mL/min (ref >60)
Glucose, Bld: 113 mg/dL — ABNORMAL HIGH (ref 70–99)
Potassium: 3.2 mmol/L — ABNORMAL LOW (ref 3.5–5.1)
Sodium: 142 mmol/L (ref 135–145)
Total Bilirubin: 0.7 mg/dL (ref 0.3–1.2)
Total Protein: 4.9 g/dL — ABNORMAL LOW (ref 6.5–8.1)

## 2020-10-05 LAB — GLUCOSE, CAPILLARY
Glucose-Capillary: 118 mg/dL — ABNORMAL HIGH (ref 70–99)
Glucose-Capillary: 96 mg/dL (ref 70–99)
Glucose-Capillary: 102 mg/dL — ABNORMAL HIGH (ref 70–99)
Glucose-Capillary: 120 mg/dL — ABNORMAL HIGH (ref 70–99)
Glucose-Capillary: 115 mg/dL — ABNORMAL HIGH (ref 70–99)
Glucose-Capillary: 104 mg/dL — ABNORMAL HIGH (ref 70–99)
Glucose-Capillary: 102 mg/dL — ABNORMAL HIGH (ref 70–99)
Glucose-Capillary: 97 mg/dL (ref 70–99)
Glucose-Capillary: 108 mg/dL — ABNORMAL HIGH (ref 70–99)

## 2020-10-05 LAB — URINE CULTURE: Culture: NO GROWTH

## 2020-10-05 LAB — CBC WITH DIFFERENTIAL/PLATELET
Abs Immature Granulocytes: 0.02 10*3/uL (ref 0.00–0.07)
Basophils Absolute: 0 10*3/uL (ref 0.0–0.1)
Basophils Relative: 0 %
Eosinophils Absolute: 0.7 10*3/uL — ABNORMAL HIGH (ref 0.0–0.5)
Eosinophils Relative: 10 %
HCT: 41.6 % (ref 39.0–52.0)
Hemoglobin: 12.7 g/dL — ABNORMAL LOW (ref 13.0–17.0)
Immature Granulocytes: 0 %
Lymphocytes Relative: 12 %
Lymphs Abs: 0.8 10*3/uL (ref 0.7–4.0)
MCH: 29.1 pg (ref 26.0–34.0)
MCHC: 30.5 g/dL (ref 30.0–36.0)
MCV: 95.4 fL (ref 80.0–100.0)
Monocytes Absolute: 0.6 10*3/uL (ref 0.1–1.0)
Monocytes Relative: 8 %
Neutro Abs: 4.8 10*3/uL (ref 1.7–7.7)
Neutrophils Relative %: 70 %
Platelets: 161 10*3/uL (ref 150–400)
RBC: 4.36 MIL/uL (ref 4.22–5.81)
RDW: 17.6 % — ABNORMAL HIGH (ref 11.5–15.5)
WBC: 6.9 10*3/uL (ref 4.0–10.5)
nRBC: 0 % (ref 0.0–0.2)

## 2020-10-05 LAB — MAGNESIUM: Magnesium: 2.2 mg/dL (ref 1.7–2.4)

## 2020-10-05 MED ORDER — APIXABAN 5 MG PO TABS
5.0000 mg | ORAL_TABLET | Freq: Two times a day (BID) | ORAL | Status: DC
  Administered 2020-10-05 – 2020-10-08 (×6): 5 mg via ORAL
  Filled 2020-10-05 (×6): qty 1

## 2020-10-05 MED ORDER — METOPROLOL TARTRATE 25 MG PO TABS
12.5000 mg | ORAL_TABLET | Freq: Two times a day (BID) | ORAL | Status: DC
  Administered 2020-10-05 – 2020-10-06 (×2): 12.5 mg via ORAL
  Filled 2020-10-05 (×2): qty 1

## 2020-10-05 NOTE — Progress Notes (Addendum)
MD Tat notified & aware for foley orders.  MD states to leave in foley.

## 2020-10-05 NOTE — TOC Initial Note (Signed)
Transition of Care [facility]) - Initial/Assessment Note    Patient Details  Name: Eric Bowman MRN: [id number] Date of Birth: 08-Nov-1932  [facility] of [facility]) CM/SW Contact:    Salome Arnt, [place] Phone Number: 10/05/2020, 11:52 AM  Clinical Narrative: Pt admitted due to severe sepsis. He has been a resident at [facility] for over a year. LCSW attempted to reach pt's wife to complete assessment, but no answer and no voicemail set up. Per Marianna Fuss at [facility], pt is okay to return as long-term resident. She reports staff has noticed a decline in pt the past few months. Palliative consult pending. TOC will continue to follow.                   Expected Discharge Plan: Skilled Nursing Facility Barriers to Discharge: Continued Medical Work up   Patient Goals and CMS Choice Patient states their goals for this hospitalization and ongoing recovery are:: anticipate return to [facility]      Expected Discharge Plan and Services Expected Discharge Plan: [place] In-house Referral: Clinical Social Work, Hospice / [place] Acute Care Choice: Nursing Home Living arrangements for the past 2 months: [place]                                      Prior Living Arrangements/Services Living arrangements for the past 2 months: [place] Lives with:: Facility Resident Patient language and need for interpreter reviewed:: Yes Do you feel safe going back to the place where you live?: Yes            Criminal Activity/Legal Involvement Pertinent to Current Situation/Hospitalization: No - Comment as needed  Activities of Daily Living Home Assistive Devices/Equipment: Wheelchair ADL Screening (condition at time of admission) Patient's cognitive ability adequate to safely complete daily activities?: No Is the patient deaf or have difficulty hearing?: No Does the patient have difficulty seeing, even when wearing glasses/contacts?: No Does  the patient have difficulty concentrating, remembering, or making decisions?: Yes Patient able to express need for assistance with ADLs?: No Does the patient have difficulty dressing or bathing?: Yes Independently performs ADLs?: No Communication: Independent Dressing (OT): Dependent Is this a change from baseline?: Pre-admission baseline Grooming: Dependent Is this a change from baseline?: Pre-admission baseline Feeding: Needs assistance Is this a change from baseline?: Pre-admission baseline Bathing: Dependent Is this a change from baseline?: Pre-admission baseline Toileting: Dependent Is this a change from baseline?: Pre-admission baseline In/Out Bed: Dependent Is this a change from baseline?: Pre-admission baseline Walks in Home: Dependent Is this a change from baseline?: Pre-admission baseline Does the patient have difficulty walking or climbing stairs?: Yes Weakness of Legs: Both Weakness of Arms/Hands: Both  Permission Sought/Granted                  Emotional Assessment         Alcohol / Substance Use: Not Applicable Psych Involvement: No (comment)  Admission diagnosis:  Hypotension [I95.9] Acute sepsis [facility]) [A41.9] Patient Active Problem List   Diagnosis Date Noted  . Severe sepsis with septic shock ([place]) 10/04/2020  . Hyperkalemia 10/02/2020  . Shock circulatory ([place]) 10/02/2020  . Severe dehydration 10/02/2020  . Lactic acidosis 10/02/2020  . DNR (do not resuscitate) 10/02/2020  . Encounter for annual wellness visit (AWV) in Medicare patient 09/18/2020  . Hypertensive heart disease with chronic diastolic congestive heart failure (  [place]) 08/26/2020  . Acute renal failure ([place]) 08/25/2020  . Hypernatremia 08/25/2020  . Chronic diastolic (congestive) heart failure ([place]) 08/14/2020  . Dysphagia 08/05/2020  . Hypotension 08/04/2020  . COPD with hypoxia ([place]) 08/04/2020  . Bilateral lower extremity edema 08/04/2020  . S/P thoracentesis   . Atrial  fibrillation with rapid ventricular response ([place]) 07/27/2020  . History of sarcoidosis 07/22/2020  . History of melanoma 07/22/2020  . HCAP (healthcare-associated pneumonia) 07/20/2020  . Pleural effusion 07/20/2020  . Elevated brain natriuretic peptide (BNP) level 07/20/2020  . Cellulitis of abdominal wall 07/07/2020  . Chronic non-seasonal allergic rhinitis 06/26/2020  . Weight gain 06/11/2020  . Weight loss 12/17/2019  . Poliomyelitis osteopathy of lower leg, right ([place]) 11/01/2019  . COVID-19 10/15/2019  . Radiculopathy of cervical spine 09/09/2019  . Dyslipidemia 06/24/2019  . Vascular dementia without behavioral disturbance ([place]) 06/24/2019  . Chronic gout without tophus 06/24/2019  . Physical deconditioning 06/24/2019  . Multiple falls 06/20/2019  . Arthritis of knee, left 01/03/2013  . Degenerative disc disease, lumbar 01/03/2013  . Coronary artery disease 09/13/2011  . Obstructive sleep apnea on CPAP 09/10/2007  . Essential hypertension 05/02/2007  . GERD 05/02/2007   PCP:  Gerlene Fee, NP Pharmacy:   [facility] - [place], [place] - [address] [place] [address] Phone: [phone] Fax: [phone]     Social Determinants of Health (SDOH) Interventions    Readmission Risk Interventions Readmission Risk Prevention Plan 10/05/2020 08/01/2020  Post Dischage Appt - Complete  Medication Screening - Complete  Transportation Screening Complete Complete  Medication Review ([place]) Complete -  PCP or Specialist appointment within 3-5 days of discharge Complete -  [place] or Home Care Consult Complete -  SW Recovery Care/Counseling Consult Complete -  Palliative Care Screening (No Data) -  [place] Complete -  Some recent data might be hidden

## 2020-10-05 NOTE — Progress Notes (Signed)
PROGRESS NOTE  Eric Bowman NUU:[id number] DOB: 1933-07-21 DOA: 10/02/2020 PCP: Gerlene Fee, NP  Brief History:  86 [facility] significant[facility], gout, CAD, diastolic CHF, GERD, hyperlipidemia, Hypertension, atrial fibrillation on apixaban, on torsemide for CHF, has dementia and apparently has not been eating and drinking well for the past several days. He was noted to be more lethargic and was seen by the NP at the SNF and labs were drawn. He was hypotensive at the time. His labs revealed a severe hypernatremia, hyperkalemia and acute kidney injury. He was sent to the emergency department for further evaluation and treatments.  He was tachycardic on arrival 100.8, pulse 147, BP 78/61, 99% on room air.His labs revealed sodium of 161, potassium 6.4, BUN 105 creatinine 2.16, anion gap 17, CO2 26, glucose 116, AST 60, ALT 38, total bilirubin 1.4, estimated GFR 29, BNP 726, WBC 10.6, hemoglobin 17.4, hematocrit 59.4, INR 2.9, lactic acid 3.9, SARS 2 coronavirus test negative, influenza A and influenza B tests negative. Urinalysis no infection seen, MRSA screen negative. Chest x-Derrall with persistent right-sided atelectasis. Cardiomegaly seen on chest x-Tyjuan. PCCM was consulted and she was evaluated by Dr. Melvyn Novas. He discussed the case fully with wife at bedside and she was made aware of how critically ill patient was at this time. She elected a trial of treating with fluids and antibiotics to see if he will have a meaningful recovery. No pressors or central line. He is to remain DNR status.  Assessment/Plan: Septic shock -Secondary to pneumonia -Lactic acid peaked at 3.8 -PCT 0.11>> 0.24>> 0.20 -Continue empiric cefepime -Personally reviewed chest x-Fredie--right lower lobe opacity -Blood and urine cultures--negative to date -Change fluids to LR -CT chest--bilateral pleural effusions R>L with atelectasis  Atrial  fibrillation with RVR -HR 120-130 -If no improvement or worsening, plan to start amiodarone -Personally reviewed EKG--atrial fibrillation RVR nonspecific ST changes -Anticipate improvement with treatment of sepsis -Continue apixaban -Holding Cardizem CD and metoprolol succinate secondary to hypotension -continue amiodarone drip -anticipate slow improvement of HR with treatment of sepsis -restart low dose metoprolol  Hypernatremia -Presented with sodium 161 -Improved with hypotonic fluid  Chronic diastolic CHF -Appears clinically euvolemic -Holding metoprolol succinate secondary to hypotension -Holding torsemide temporarily -07/27/20 Echo--EF 60-65%, mild decrease RV function; trivial pericardial effusion -decrease IVF rate  Acute kidney injury -Baseline creatinine 0.7-0.9 -Presented with serum creatinine 2.16 -Continue IV fluids  Hyperkalemia -Initially treated with Lokelma -Overall improved -now hypokalemic  Hyperlipidemia -Continue statin  Dementia without behavioral disturbance -Continue Aricept  Hypokalemia -replete -mag 2.2      Status is: Inpatient  Remains inpatient appropriate because:IV treatments appropriate due to intensity of illness or inability to take PO   Dispo: The patient is from: SNF  Anticipated d/c is to: SNF  Anticipated d/c date is: 2 days  Patient currently is not medically stable to d/c.         Family Communication:  spouse updated 12/6  Consultants:  none  Code Status:  DNR  DVT Prophylaxis: apixaban   Procedures: As Listed in Progress Note Above  Antibiotics: Cefepime 12/3>> vanco 12/3>>   Subjective: Patient denies fevers, chills, headache, chest pain, dyspnea, nausea, vomiting, diarrhea, abdominal pain, dysuria, hematuria, hematochezia, and melena.   Objective: Vitals:   10/05/20 0500 10/05/20 0600 10/05/20 0915 10/05/20 1500  BP: (!)  102/47 (!) 100/45 108/77   Pulse: 85 94    Resp: (!)  27 (!) 28    Temp: 99 F (37.2 C) 99 F (37.2 C)    TempSrc:    Bladder  SpO2: 95% 96%    Weight:      Height:        Intake/Output Summary (Last 24 hours) at 10/05/2020 1651 Last data filed at 10/05/2020 1552 Gross per 24 hour  Intake 1370.07 ml  Output 1470 ml  Net -99.93 ml   Weight change:  Exam:   General:  Pt is alert, follows commands appropriately, not in acute distress  HEENT: No icterus, No thrush, No neck mass, [place]/AT  Cardiovascular: IRRR, S1/S2, no rubs, no gallops  Respiratory: bibasilar rales. No wheeze  Abdomen: Soft/+BS, non tender, non distended, no guarding  Extremities: trace LE edema, No lymphangitis, No petechiae, No rashes, no synovitis   Data Reviewed: I have personally reviewed following labs and imaging studies Basic Metabolic Panel: Recent Labs  Lab 10/02/20 1019 10/02/20 1207 10/03/20 0415 10/04/20 0316 10/05/20 0448  NA 161*  --  149* 142 142  K 6.4* 6.0* 4.2 3.5 3.2*  CL 118*  --  118* 114* 115*  CO2 26  --  21* 19* 21*  GLUCOSE 116*  --  105* 131* 113*  BUN 105*  --  82* 64* 45*  CREATININE 2.16*  --  1.73* 1.30* 1.10  CALCIUM 10.2  --  8.4* 8.2* 8.3*  MG  --   --  2.1 1.7 2.2   Liver Function Tests: Recent Labs  Lab 10/02/20 1019 10/03/20 0415 10/04/20 0316 10/05/20 0448  AST 60* 70* 40 31  ALT 38 46* 34 32  ALKPHOS 130* 102 85 88  BILITOT 1.4* 0.7 0.6 0.7  PROT 7.7 5.6* 4.8* 4.9*  ALBUMIN 3.8 2.8* 2.3* 2.3*   No results for input(s): LIPASE, AMYLASE in the last 168 hours. No results for input(s): AMMONIA in the last 168 hours. Coagulation Profile: Recent Labs  Lab 10/02/20 1207  INR 2.9*   CBC: Recent Labs  Lab 10/02/20 1019 10/03/20 0415 10/04/20 0316 10/05/20 0448  WBC 10.6* 12.1* 8.6 6.9  NEUTROABS  --  8.8* 6.5 4.8  HGB 17.4* 14.4 12.6* 12.7*  HCT 59.4* 50.1 42.7 41.6  MCV 98.0 101.2* 96.8 95.4  PLT 295 224 172 161   Cardiac Enzymes: No  results for input(s): CKTOTAL, CKMB, CKMBINDEX, TROPONINI in the last 168 hours. BNP: Invalid input(s): POCBNP CBG: Recent Labs  Lab 10/04/20 1116 10/05/20 0112 10/05/20 0636 10/05/20 0750 10/05/20 1108  GLUCAP 115* 104* 102* 97 108*   HbA1C: No results for input(s): HGBA1C in the last 72 hours. Urine analysis:    Component Value Date/Time   COLORURINE YELLOW 10/02/2020 1155   APPEARANCEUR CLEAR 10/02/2020 1155   LABSPEC 1.012 10/02/2020 1155   PHURINE 5.0 10/02/2020 1155   GLUCOSEU NEGATIVE 10/02/2020 1155   HGBUR NEGATIVE 10/02/2020 1155   [place] 10/02/2020 1155   [place] 10/02/2020 1155   PROTEINUR NEGATIVE 10/02/2020 1155   NITRITE NEGATIVE 10/02/2020 1155   LEUKOCYTESUR NEGATIVE 10/02/2020 1155   Sepsis Labs: @LABRCNTIP (procalcitonin:4,lacticidven:4) ) Recent Results (from the past 240 hour(s))  Blood Culture (routine x 2)     Status: None (Preliminary result)   Collection Time: 10/02/20 12:20 PM   Specimen: BLOOD RIGHT WRIST  Result Value Ref Range Status   Specimen Description BLOOD RIGHT WRIST DRAWN BY RN  Final   Special Requests   Final    BOTTLES DRAWN AEROBIC AND ANAEROBIC Blood Culture adequate volume  Culture   Final    NO GROWTH 3 DAYS Performed at [facility], [address] [place], [place] [address]    Report Status PENDING  Incomplete  Urine culture     Status: None   Collection Time: 10/02/20 12:20 PM   Specimen: In/Out Cath Urine  Result Value Ref Range Status   Specimen Description   Final    IN/OUT CATH URINE Performed at [facility], [address], [place], [place] [address]    Special Requests   Final    NONE Performed at [facility], [address] [place], [place] [address]    Culture   Final    NO GROWTH Performed at [facility], [place] [address], [place], [place] [address]    Report Status 10/05/2020 FINAL  Final  Blood Culture (routine x 2)     Status: None (Preliminary result)   Collection  Time: 10/02/20 12:25 PM   Specimen: BLOOD LEFT ARM  Result Value Ref Range Status   Specimen Description BLOOD LEFT ARM DRAWN BY RN  Final   Special Requests   Final    BOTTLES DRAWN AEROBIC AND ANAEROBIC Blood Culture adequate volume   Culture   Final    NO GROWTH 3 DAYS Performed at [facility], [address], [place], [place] [address]    Report Status PENDING  Incomplete  Resp Panel by RT-PCR (Flu A&B, Covid) Nasopharyngeal Swab     Status: None   Collection Time: 10/02/20 12:46 PM   Specimen: Nasopharyngeal Swab; Nasopharyngeal(NP) swabs in vial transport medium  Result Value Ref Range Status   SARS Coronavirus 2 by RT PCR NEGATIVE NEGATIVE Final    Comment: (NOTE) SARS-CoV-2 target nucleic acids are NOT DETECTED.  The SARS-CoV-2 RNA is generally detectable in upper respiratory specimens during the acute phase of infection. The lowest concentration of SARS-CoV-2 viral copies this assay can detect is 138 copies/mL. A negative result does not preclude SARS-Cov-2 infection and should not be used as the sole basis for treatment or other patient management decisions. A negative result may occur with  improper specimen collection/handling, submission of specimen other than nasopharyngeal swab, presence of viral mutation(s) within the areas targeted by this assay, and inadequate number of viral copies(<138 copies/mL). A negative result must be combined with clinical observations, patient history, and epidemiological information. The expected result is Negative.  Fact Sheet for Patients:  EntrepreneurPulse.com.au  Fact Sheet for Healthcare Providers:  IncredibleEmployment.be  This test is no t yet approved or cleared by the [place] FDA and  has been authorized for detection and/or diagnosis of SARS-CoV-2 by FDA under an Emergency Use Authorization (EUA). This EUA will remain  in effect (meaning this test can be used) for the duration of  the COVID-19 declaration under Section 564(b)(1) of the Act, 21 U.S.C.section 360bbb-3(b)(1), unless the authorization is terminated  or revoked sooner.       Influenza A by PCR NEGATIVE NEGATIVE Final   Influenza B by PCR NEGATIVE NEGATIVE Final    Comment: (NOTE) The Xpert Xpress SARS-CoV-2/FLU/RSV plus assay is intended as an aid in the diagnosis of influenza from Nasopharyngeal swab specimens and should not be used as a sole basis for treatment. Nasal washings and aspirates are unacceptable for Xpert Xpress SARS-CoV-2/FLU/RSV testing.  Fact Sheet for Patients: EntrepreneurPulse.com.au  Fact Sheet for Healthcare Providers: IncredibleEmployment.be  This test is not yet approved or cleared by the [place] FDA and has been authorized for detection and/or diagnosis  of SARS-CoV-2 by FDA under an Emergency Use Authorization (EUA). This EUA will remain in effect (meaning this test can be used) for the duration of the COVID-19 declaration under Section 564(b)(1) of the Act, 21 U.S.C. section 360bbb-3(b)(1), unless the authorization is terminated or revoked.  Performed at [facility], [address] [place], [place] [address]   MRSA PCR Screening     Status: None   Collection Time: 10/02/20 12:58 PM   Specimen: Nasopharyngeal Swab  Result Value Ref Range Status   MRSA by PCR NEGATIVE NEGATIVE Final    Comment:        The GeneXpert MRSA Assay (FDA approved for NASAL specimens only), is one component of a comprehensive MRSA colonization surveillance program. It is not intended to diagnose MRSA infection nor to guide or monitor treatment for MRSA infections. Performed at [facility], [address] [place], [place] [address]      Scheduled Meds: . allopurinol  300 mg Oral Daily  . apixaban  5 mg Oral BID  . atorvastatin  20 mg Oral QPM  . Chlorhexidine Gluconate Cloth  6 each Topical Daily  . donepezil  10 mg Oral QHS  .  feeding supplement  237 mL Oral BID BM  . midodrine  10 mg Oral TID WC  . pantoprazole (PROTONIX) IV  40 mg Intravenous Q24H   Continuous Infusions: . sodium chloride    . amiodarone 30 mg/hr (10/05/20 1029)  . ceFEPime (MAXIPIME) IV 2 g (10/05/20 1237)  . lactated ringers 100 mL/hr at 10/05/20 0102    Procedures/Studies: DG Chest 2 View  Result Date: 10/02/2020 CLINICAL DATA:  Pleural effusion. EXAM: CHEST - 2 VIEW COMPARISON:  08/27/2020.  08/26/2020. FINDINGS: Cardiomegaly. No pulmonary venous congestion. Persistent right base atelectasis/infiltrate and right-sided pleural effusion. No interim change. Nodular opacity noted over the left lung base most likely nipple shadow as it was not present on prior recent studies. Symmetric nipple opacity noted over the right chest wall. Degenerative changes lumbar spine and both shoulders. IMPRESSION: 1. Cardiomegaly. No pulmonary venous congestion. 2. Persistent right base atelectasis/infiltrate and right-sided pleural effusion. No interim change. No pneumothorax. Electronically Signed   By: Marcello Moores  Register   On: 10/02/2020 11:05   CT CHEST WO CONTRAST  Result Date: 10/04/2020 CLINICAL DATA:  Abnormal chest x-Muaad, short of breath, cough EXAM: CT CHEST WITHOUT CONTRAST TECHNIQUE: Multidetector CT imaging of the chest was performed following the standard protocol without IV contrast. COMPARISON:  10/02/2020, 08/27/2020 FINDINGS: Cardiovascular: Heart is unremarkable without pericardial effusion. Moderate atherosclerosis of the coronary vasculature. Mild atherosclerosis of the aortic arch. Normal caliber of the thoracic aorta. Mediastinum/Nodes: No enlarged mediastinal or axillary lymph nodes. Thyroid gland, trachea, and esophagus demonstrate no significant findings. Lungs/Pleura: There are bilateral pleural effusions. On the right volume estimated greater than 2 L. On the left, volume estimated less than 500 cc. Compressive atelectasis within the bilateral  lower lobes, right greater than left. No pneumothorax. Central airways are patent. Upper Abdomen: No acute abnormality. Musculoskeletal: No acute or destructive bony lesions. Reconstructed images demonstrate no additional findings. IMPRESSION: 1. Bilateral pleural effusions, right greater than left. Compressive atelectasis of the lower lobes. 2. Aortic Atherosclerosis (ICD10-I70.0). Coronary artery atherosclerosis. Electronically Signed   By: Randa Ngo M.D.   On: 10/04/2020 18:25   US Abdomen Complete  Result Date: 10/03/2020 CLINICAL DATA:  Elevated LFTs for 1 week EXAM: ABDOMEN ULTRASOUND COMPLETE COMPARISON:  None. FINDINGS: Gallbladder: Not well visualized Common bile duct: Not well visualized  Liver: Diffusely increased in echogenicity. Focal 1 cm cyst is noted in the left lobe of the liver. No biliary ductal dilatation is seen. No other mass is noted. Portal vein is patent on color Doppler imaging with normal direction of blood flow towards the liver. IVC: No abnormality visualized. Pancreas: Visualized portion unremarkable. Spleen: Not well visualized due to overlying bowel gas Right Kidney: Not well visualized. Left Kidney: Length: 10.6 cm. 1.7 cm cyst is noted within the left kidney. No obstructive changes are seen. Abdominal aorta: No aneurysm visualized. Other findings: Small right pleural effusion is noted. IMPRESSION: Extremely limited exam due to overlying bowel gas. No acute abnormality is seen aside from a small right pleural effusion. Hepatic and renal cysts. Electronically Signed   By: Inez Catalina M.D.   On: 10/03/2020 10:15    Orson Eva, DO  [facility]  If 7PM-7AM, please contact night-coverage [email or web address] Password [email or web address] 10/05/2020, 4:51 PM   LOS: 3 days

## 2020-10-05 NOTE — NC FL2 (Signed)
[place] LEVEL OF CARE SCREENING TOOL     IDENTIFICATION  Patient Name: Eric Bowman Birthdate: 01/12/1933 Sex: male Admission Date (Current Location): 10/02/2020  [facility] and [place] Number:  [employer] and Address:  [place] [address], [place]      Provider Number: [id number]  Attending Physician Name and Address:  Orson Eva, MD  Relative Name and Phone Number:       Current Level of Care: Hospital Recommended Level of Care: [place] Prior Approval Number:    Date Approved/Denied:   PASRR Number:    Discharge Plan: SNF    Current Diagnoses: Patient Active Problem List   Diagnosis Date Noted  . Severe sepsis with septic shock ([place]) 10/04/2020  . Hyperkalemia 10/02/2020  . Shock circulatory ([place]) 10/02/2020  . Severe dehydration 10/02/2020  . Lactic acidosis 10/02/2020  . DNR (do not resuscitate) 10/02/2020  . Encounter for annual wellness visit (AWV) in Medicare patient 09/18/2020  . Hypertensive heart disease with chronic diastolic congestive heart failure ([place]) 08/26/2020  . Acute renal failure ([place]) 08/25/2020  . Hypernatremia 08/25/2020  . Chronic diastolic (congestive) heart failure ([place]) 08/14/2020  . Dysphagia 08/05/2020  . Hypotension 08/04/2020  . COPD with hypoxia ([place]) 08/04/2020  . Bilateral lower extremity edema 08/04/2020  . S/P thoracentesis   . Atrial fibrillation with rapid ventricular response ([place]) 07/27/2020  . History of sarcoidosis 07/22/2020  . History of melanoma 07/22/2020  . HCAP (healthcare-associated pneumonia) 07/20/2020  . Pleural effusion 07/20/2020  . Elevated brain natriuretic peptide (BNP) level 07/20/2020  . Cellulitis of abdominal wall 07/07/2020  . Chronic non-seasonal allergic rhinitis 06/26/2020  . Weight gain 06/11/2020  . Weight loss 12/17/2019  . Poliomyelitis osteopathy of lower leg, right ([place]) 11/01/2019  . COVID-19 10/15/2019  .  Radiculopathy of cervical spine 09/09/2019  . Dyslipidemia 06/24/2019  . Vascular dementia without behavioral disturbance ([place]) 06/24/2019  . Chronic gout without tophus 06/24/2019  . Physical deconditioning 06/24/2019  . Multiple falls 06/20/2019  . Arthritis of knee, left 01/03/2013  . Degenerative disc disease, lumbar 01/03/2013  . Coronary artery disease 09/13/2011  . Obstructive sleep apnea on CPAP 09/10/2007  . Essential hypertension 05/02/2007  . GERD 05/02/2007    Orientation RESPIRATION BLADDER Height & Weight     Self, Place, Situation  Normal Indwelling catheter Weight: 183 lb 6.8 oz (83.2 kg) Height:  5\' 10"  (177.8 cm)  BEHAVIORAL SYMPTOMS/MOOD NEUROLOGICAL BOWEL NUTRITION STATUS      Incontinent Diet (Dysphagia 2 with thin liquids. See d/c summary for updates.)  AMBULATORY STATUS COMMUNICATION OF NEEDS Skin   Total Care Verbally Skin abrasions, Bruising, Other (Comment) (unstageable to left ankle with foam dressing)                       Personal Care Assistance Level of Assistance  Bathing, Feeding, Dressing, Total care Bathing Assistance: Maximum assistance Feeding assistance: Maximum assistance Dressing Assistance: Maximum assistance     Functional Limitations Info  Sight, Hearing, Speech Sight Info: Adequate Hearing Info: Impaired Speech Info: Adequate    SPECIAL CARE FACTORS FREQUENCY                       Contractures      Additional Factors Info  Code Status, Allergies Code Status Info: DNR Allergies Info: Other, Penicillin G, Sulfonic Acid (3, 5-dibromo-4-h-ox-benz), Penicillins, Sulfonamide Derivatives, Tetanus Toxoid  Current Medications (10/05/2020):  This is the current hospital active medication list Current Facility-Administered Medications  Medication Dose Route Frequency Provider Last Rate Last Admin  . 0.9 %  sodium chloride infusion  250 mL Intravenous Continuous Reubin Milan, MD      . acetaminophen  (TYLENOL) tablet 650 mg  650 mg Oral Q6H PRN Wynetta Emery, Clanford L, MD   650 mg at 10/02/20 2024   Or  . acetaminophen (TYLENOL) suppository 650 mg  650 mg Rectal Q6H PRN Johnson, Clanford L, MD      . allopurinol (ZYLOPRIM) tablet 300 mg  300 mg Oral Daily Johnson, Clanford L, MD   300 mg at 10/05/20 0905  . amiodarone (NEXTERONE PREMIX) 360-4.14 MG/200ML-% (1.8 mg/mL) IV infusion  30 mg/hr Intravenous Continuous Tat, David, MD 16.67 mL/hr at 10/05/20 1029 30 mg/hr at 10/05/20 1029  . apixaban (ELIQUIS) tablet 2.5 mg  2.5 mg Oral BID Wynetta Emery, Clanford L, MD   2.5 mg at 10/05/20 0905  . atorvastatin (LIPITOR) tablet 20 mg  20 mg Oral QPM Johnson, Clanford L, MD   20 mg at 10/04/20 1718  . ceFEPIme (MAXIPIME) 2 g in sodium chloride 0.9 % 100 mL IVPB  2 g Intravenous Q12H Johnson, Clanford L, MD 200 mL/hr at 10/05/20 0148 2 g at 10/05/20 0148  . Chlorhexidine Gluconate Cloth 2 % PADS 6 each  6 each Topical Daily Murlean Iba, MD   6 each at 10/05/20 0905  . donepezil (ARICEPT) tablet 10 mg  10 mg Oral QHS Johnson, Clanford L, MD   10 mg at 10/04/20 2232  . feeding supplement (ENSURE ENLIVE / ENSURE PLUS) liquid 237 mL  237 mL Oral BID BM Johnson, Clanford L, MD   237 mL at 10/05/20 0906  . ipratropium-albuterol (DUONEB) 0.5-2.5 (3) MG/3ML nebulizer solution 3 mL  3 mL Nebulization Q6H PRN Wynetta Emery, Clanford L, MD      . lactated ringers infusion   Intravenous Continuous Tat, Shanon Brow, MD 100 mL/hr at 10/05/20 0913 New Bag at 10/05/20 0913  . midodrine (PROAMATINE) tablet 10 mg  10 mg Oral TID WC Johnson, Clanford L, MD   10 mg at 10/05/20 0858  . ondansetron (ZOFRAN) tablet 4 mg  4 mg Oral Q6H PRN Johnson, Clanford L, MD       Or  . ondansetron (ZOFRAN) injection 4 mg  4 mg Intravenous Q6H PRN Johnson, Clanford L, MD      . pantoprazole (PROTONIX) injection 40 mg  40 mg Intravenous Q24H Johnson, Clanford L, MD   40 mg at 10/04/20 1719  . traZODone (DESYREL) tablet 25 mg  25 mg Oral QHS PRN Murlean Iba, MD         Discharge Medications: Please see discharge summary for a list of discharge medications.  Relevant Imaging Results:  Relevant Lab Results:   Additional Information CPAP.  Salome Arnt, LCSW

## 2020-10-06 LAB — GLUCOSE, CAPILLARY
Glucose-Capillary: 101 mg/dL — ABNORMAL HIGH (ref 70–99)
Glucose-Capillary: 158 mg/dL — ABNORMAL HIGH (ref 70–99)
Glucose-Capillary: 132 mg/dL — ABNORMAL HIGH (ref 70–99)

## 2020-10-06 LAB — CBC WITH DIFFERENTIAL/PLATELET
Abs Immature Granulocytes: 0.02 10*3/uL (ref 0.00–0.07)
Basophils Absolute: 0 10*3/uL (ref 0.0–0.1)
Basophils Relative: 0 %
Eosinophils Absolute: 0.7 10*3/uL — ABNORMAL HIGH (ref 0.0–0.5)
Eosinophils Relative: 10 %
HCT: 42.5 % (ref 39.0–52.0)
Hemoglobin: 12.4 g/dL — ABNORMAL LOW (ref 13.0–17.0)
Immature Granulocytes: 0 %
Lymphocytes Relative: 15 %
Lymphs Abs: 1 10*3/uL (ref 0.7–4.0)
MCH: 28.5 pg (ref 26.0–34.0)
MCHC: 29.2 g/dL — ABNORMAL LOW (ref 30.0–36.0)
MCV: 97.7 fL (ref 80.0–100.0)
Monocytes Absolute: 0.6 10*3/uL (ref 0.1–1.0)
Monocytes Relative: 9 %
Neutro Abs: 4.5 10*3/uL (ref 1.7–7.7)
Neutrophils Relative %: 66 %
Platelets: 155 10*3/uL (ref 150–400)
RBC: 4.35 MIL/uL (ref 4.22–5.81)
RDW: 17.8 % — ABNORMAL HIGH (ref 11.5–15.5)
WBC: 6.9 10*3/uL (ref 4.0–10.5)
nRBC: 0 % (ref 0.0–0.2)

## 2020-10-06 LAB — COMPREHENSIVE METABOLIC PANEL
ALT: 29 U/L (ref 0–44)
AST: 29 U/L (ref 15–41)
Albumin: 2.1 g/dL — ABNORMAL LOW (ref 3.5–5.0)
Alkaline Phosphatase: 87 U/L (ref 38–126)
Anion gap: 6 (ref 5–15)
BUN: 32 mg/dL — ABNORMAL HIGH (ref 8–23)
CO2: 22 mmol/L (ref 22–32)
Calcium: 8.3 mg/dL — ABNORMAL LOW (ref 8.9–10.3)
Chloride: 115 mmol/L — ABNORMAL HIGH (ref 98–111)
Creatinine, Ser: 0.88 mg/dL (ref 0.61–1.24)
GFR, Estimated: >60 mL/min (ref >60)
Glucose, Bld: 99 mg/dL (ref 70–99)
Potassium: 3.6 mmol/L (ref 3.5–5.1)
Sodium: 143 mmol/L (ref 135–145)
Total Bilirubin: 0.4 mg/dL (ref 0.3–1.2)
Total Protein: 4.8 g/dL — ABNORMAL LOW (ref 6.5–8.1)

## 2020-10-06 LAB — MAGNESIUM: Magnesium: 2.1 mg/dL (ref 1.7–2.4)

## 2020-10-06 MED ORDER — METOPROLOL TARTRATE 25 MG PO TABS
25.0000 mg | ORAL_TABLET | Freq: Two times a day (BID) | ORAL | Status: DC
  Administered 2020-10-06 – 2020-10-08 (×4): 25 mg via ORAL
  Filled 2020-10-06 (×4): qty 1

## 2020-10-06 MED ORDER — SODIUM CHLORIDE 0.9 % IV SOLN
2.0000 g | Freq: Three times a day (TID) | INTRAVENOUS | Status: DC
  Administered 2020-10-06 – 2020-10-08 (×6): 2 g via INTRAVENOUS
  Filled 2020-10-06 (×6): qty 2

## 2020-10-06 NOTE — Progress Notes (Signed)
Pharmacy Antibiotic Note  Eric Bowman is a 86 y.o. male admitted on 10/02/2020 with sepsis  due  PNA. Pharmacy has been consulted for cefepime  dosing.    Afebrile. Chest x-Bhavya--right lower lobe opacity. . SCr improving, will adjust abx  Plan: Increase cefepime 2g IV q8h  Pharmacy will continue to monitor renal function,   cultures and patient progress. F/U LOT  Height: 5\' 10"  (177.8 cm) Weight: 83.2 kg (183 lb 6.8 oz) IBW/kg (Calculated) : 73  Temp (24hrs), Avg:99.2 F (37.3 C), Min:98.4 F (36.9 C), Max:99.5 F (37.5 C)  Recent Labs  Lab 10/02/20 1019 10/02/20 1207 10/02/20 1618 10/02/20 2056 10/03/20 0415 10/03/20 0416 10/04/20 0316 10/05/20 0448 10/06/20 0446  WBC 10.6*  --   --   --  12.1*  --  8.6 6.9 6.9  CREATININE 2.16*  --   --   --  1.73*  --  1.30* 1.10 0.88  LATICACIDVEN  --  3.9* 3.8* 3.8*  --  1.9  --   --   --     Estimated Creatinine Clearance: 63.4 mL/min (by C-G formula based on SCr of 0.88 mg/dL).    Allergies  Allergen Reactions  . Other   . Penicillin G     Pt tolerated cefepime on 10/02/20 with no issues.  . Sulfonic Acid (3,5-Dibromo-4-H-Ox-Benz)   . Penicillins Rash    Pt tolerated cefepime on 10/02/20 with no issues.  . Sulfonamide Derivatives Rash  . Tetanus Toxoid Rash    Antimicrobials this admission: vancomycin 12/3 >>  12/3 aztreonam 12/3 >> 12/3 cefepime 12/3>>   Dose adjustments this admission: vancomycin and aztreonam  Microbiology results: 12/3 [facility] x2: ngtd 12/3 UCx:   ng 12/3 MRSA PCR:  neg  Thank you for allowing pharmacy to be a part of this patient's care. Isac Sarna, BS [place], [place] Clinical Pharmacist Pager [phone] 10/06/2020 12:01 PM

## 2020-10-06 NOTE — Progress Notes (Signed)
Palliative-   Thank you for this consult. Chart reviewed.   Called and spoke with patient's daughterHelene Kelp.   Plan made to meet with daughter and spouse tomorrow 12/8 at [place], AGNP-C Palliative Medicine  Please call Palliative Medicine team phone with any questions [phone]. For individual providers please see AMION.  No charge note

## 2020-10-06 NOTE — Progress Notes (Signed)
PROGRESS NOTE  Eric Bowman MQK:[id number] DOB: 07/07/1933 DOA: 10/02/2020 PCP: Gerlene Fee, NP  Brief History:  86 [facility] significant[facility], gout, CAD, diastolic CHF, GERD, hyperlipidemia, Hypertension, atrial fibrillation on apixaban, on torsemide for CHF, has dementia and apparently has not been eating and drinking well for the past several days. He was noted to be more lethargic and was seen by the NP at the SNF and labs were drawn. He was hypotensive at the time. His labs revealed a severe hypernatremia, hyperkalemia and acute kidney injury. He was sent to the emergency department for further evaluation and treatments.  He was tachycardic on arrival 100.8, pulse 147, BP 78/61, 99% on room air.His labs revealed sodium of 161, potassium 6.4, BUN 105 creatinine 2.16, anion gap 17, CO2 26, glucose 116, AST 60, ALT 38, total bilirubin 1.4, estimated GFR 29, BNP 726, WBC 10.6, hemoglobin 17.4, hematocrit 59.4, INR 2.9, lactic acid 3.9, SARS 2 coronavirus test negative, influenza A and influenza B tests negative. Urinalysis no infection seen, MRSA screen negative. Chest x-[place] with persistent right-sided atelectasis. Cardiomegaly seen on chest x-[place]. PCCM was consulted and she was evaluated by Dr. Melvyn Novas. He discussed the case fully with wife at bedside and she was made aware of how critically ill patient was at this time. She elected a trial of treating with fluids and antibiotics to see if he will have a meaningful recovery. No pressors or central line. He is to remain DNR status.  Assessment/Plan: Septic shock -Secondary to pneumonia -Lactic acid peaked at 3.8 -PCT 0.11>>0.24>>0.20 -Continue empiric cefepime -Personally reviewed chest x-[place]--right lower lobe opacity -Blood and urine cultures--negative to date -Change fluids to LR -CT chest--bilateral pleural effusions R>L with atelectasis  Atrial  fibrillation with RVR -HR 110-120; up to 130s with activity -If no improvement or worsening, plan to start amiodarone -Personally reviewed EKG--atrial fibrillation RVR nonspecific ST changes -Anticipate improvement with treatment of sepsis -Continue apixaban -Holding Cardizem CD and metoprolol succinate secondary to hypotension -continue amiodarone drip--wean off as HR better controlled and BP improves -anticipate slow improvement of HR with treatment of sepsis -restart low dose metoprolol-->increase to 25 mg bid  Hypernatremia -Presented with sodium 161 -Improved with hypotonic fluid  Chronic diastolic CHF -Appears clinically euvolemic -Holding metoprolol succinate secondary to hypotension -Holding torsemide temporarily -07/27/20 Echo--EF 60-65%, mild decrease RV function; trivial pericardial effusion -decreased IVF rate to 75 cc/hr  Acute kidney injury -Baseline creatinine 0.7-0.9 -Presented with serum creatinine 2.16 -Continue IV fluids  Hyperkalemia -Initially treated withLokelma -Overall improved -now hypokalemic  Hyperlipidemia -Continue statin  Dementia without behavioral disturbance -Continue Aricept  Hypokalemia -replete -mag 2.2  Goals of Care -currently DNR -while patient has had clinical improvement, I anticipate several more days of hospitalization with high likelihood of recurrent/frequent hospitalizations -discussed with spouse -family meeting with palliative 12/8     Status is: Inpatient  Remains inpatient appropriate because:IV treatments appropriate due to intensity of illness or inability to take PO   Dispo: The patient is from:SNF Anticipated d/c is to:SNF Anticipated d/c date is: 2 days Patient currently is not medically stable to d/c.         Family Communication:spouse updated 12/7  Consultants:none  Code Status: DNR  DVT  Prophylaxis:apixaban   Procedures: As Listed in Progress Note Above  Antibiotics: Cefepime 12/3>> vanco 12/3        Subjective: Patient denies fevers, chills, headache, chest  pain, dyspnea, nausea, vomiting, diarrhea, abdominal pain, dysuria, hematuria, hematochezia, and melena.   Objective: Vitals:   10/06/20 0400 10/06/20 0500 10/06/20 0600 10/06/20 0700  BP: (!) 103/45  102/73 93/69  Pulse: 83 (!) 110 90 79  Resp: 18 18 20  (!) 25  Temp: 99 F (37.2 C) 98.8 F (37.1 C) 98.6 F (37 C) 98.4 F (36.9 C)  TempSrc: Bladder     SpO2: 96% 98% 97% 96%  Weight:      Height:        Intake/Output Summary (Last 24 hours) at 10/06/2020 1522 Last data filed at 10/06/2020 0730 Gross per 24 hour  Intake 861.48 ml  Output 1350 ml  Net -488.52 ml   Weight change:  Exam:   General:  Pt is alert, follows commands appropriately, not in acute distress  HEENT: No icterus, No thrush, No neck mass, [place]/AT  Cardiovascular: RRR, S1/S2, no rubs, no gallops  Respiratory: bibasilar crackles. No wheeze  Abdomen: Soft/+BS, non tender, non distended, no guarding  Extremities: 1+ LE edema, No lymphangitis, No petechiae, No rashes, no synovitis   Data Reviewed: I have personally reviewed following labs and imaging studies Basic Metabolic Panel: Recent Labs  Lab 10/02/20 1019 10/02/20 1019 10/02/20 1207 10/03/20 0415 10/04/20 0316 10/05/20 0448 10/06/20 0446  NA 161*  --   --  149* 142 142 143  K 6.4*   < > 6.0* 4.2 3.5 3.2* 3.6  CL 118*  --   --  118* 114* 115* 115*  CO2 26  --   --  21* 19* 21* 22  GLUCOSE 116*  --   --  105* 131* 113* 99  BUN 105*  --   --  82* 64* 45* 32*  CREATININE 2.16*  --   --  1.73* 1.30* 1.10 0.88  CALCIUM 10.2  --   --  8.4* 8.2* 8.3* 8.3*  MG  --   --   --  2.1 1.7 2.2 2.1   < > = values in this interval not displayed.   Liver Function Tests: Recent Labs  Lab 10/02/20 1019 10/03/20 0415 10/04/20 0316 10/05/20 0448 10/06/20 0446   AST 60* 70* 40 31 29  ALT 38 46* 34 32 29  ALKPHOS 130* 102 85 88 87  BILITOT 1.4* 0.7 0.6 0.7 0.4  PROT 7.7 5.6* 4.8* 4.9* 4.8*  ALBUMIN 3.8 2.8* 2.3* 2.3* 2.1*   No results for input(s): LIPASE, AMYLASE in the last 168 hours. No results for input(s): AMMONIA in the last 168 hours. Coagulation Profile: Recent Labs  Lab 10/02/20 1207  INR 2.9*   CBC: Recent Labs  Lab 10/02/20 1019 10/03/20 0415 10/04/20 0316 10/05/20 0448 10/06/20 0446  WBC 10.6* 12.1* 8.6 6.9 6.9  NEUTROABS  --  8.8* 6.5 4.8 4.5  HGB 17.4* 14.4 12.6* 12.7* 12.4*  HCT 59.4* 50.1 42.7 41.6 42.5  MCV 98.0 101.2* 96.8 95.4 97.7  PLT 295 224 172 161 155   Cardiac Enzymes: No results for input(s): CKTOTAL, CKMB, CKMBINDEX, TROPONINI in the last 168 hours. BNP: Invalid input(s): POCBNP CBG: Recent Labs  Lab 10/05/20 0750 10/05/20 1108 10/06/20 0031 10/06/20 0615 10/06/20 1125  GLUCAP 97 108* 158* 101* 132*   HbA1C: No results for input(s): HGBA1C in the last 72 hours. Urine analysis:    Component Value Date/Time   COLORURINE YELLOW 10/02/2020 1155   APPEARANCEUR CLEAR 10/02/2020 1155   LABSPEC 1.012 10/02/2020 1155   PHURINE 5.0 10/02/2020 1155   GLUCOSEU NEGATIVE  10/02/2020 [place] 10/02/2020 [place] 10/02/2020 [place] 10/02/2020 1155   PROTEINUR NEGATIVE 10/02/2020 1155   NITRITE NEGATIVE 10/02/2020 1155   LEUKOCYTESUR NEGATIVE 10/02/2020 1155   Sepsis Labs: @LABRCNTIP (procalcitonin:4,lacticidven:4) ) Recent Results (from the past 240 hour(s))  Blood Culture (routine x 2)     Status: None (Preliminary result)   Collection Time: 10/02/20 12:20 PM   Specimen: BLOOD RIGHT WRIST  Result Value Ref Range Status   Specimen Description BLOOD RIGHT WRIST DRAWN BY RN  Final   Special Requests   Final    BOTTLES DRAWN AEROBIC AND ANAEROBIC Blood Culture adequate volume   Culture   Final    NO GROWTH 3 DAYS Performed at [facility], [address] [place], [place] [address]    Report Status PENDING  Incomplete  Urine culture     Status: None   Collection Time: 10/02/20 12:20 PM   Specimen: In/Out Cath Urine  Result Value Ref Range Status   Specimen Description   Final    IN/OUT CATH URINE Performed at [facility], [address], [place], [place] [address]    Special Requests   Final    NONE Performed at [facility], [address], [place], [place] [address]    Culture   Final    NO GROWTH Performed at [facility], [place] [address], [place], [place] [address]    Report Status 10/05/2020 FINAL  Final  Blood Culture (routine x 2)     Status: None (Preliminary result)   Collection Time: 10/02/20 12:25 PM   Specimen: BLOOD LEFT ARM  Result Value Ref Range Status   Specimen Description BLOOD LEFT ARM DRAWN BY RN  Final   Special Requests   Final    BOTTLES DRAWN AEROBIC AND ANAEROBIC Blood Culture adequate volume   Culture   Final    NO GROWTH 3 DAYS Performed at [facility], [address], [place],  [address]    Report Status PENDING  Incomplete  Resp Panel by RT-PCR (Flu A&B, Covid) Nasopharyngeal Swab     Status: None   Collection Time: 10/02/20 12:46 PM   Specimen: Nasopharyngeal Swab; Nasopharyngeal(NP) swabs in vial transport medium  Result Value Ref Range Status   SARS Coronavirus 2 by RT PCR NEGATIVE NEGATIVE Final    Comment: (NOTE) SARS-CoV-2 target nucleic acids are NOT DETECTED.  The SARS-CoV-2 RNA is generally detectable in upper respiratory specimens during the acute phase of infection. The lowest concentration of SARS-CoV-2 viral copies this assay can detect is 138 copies/mL. A negative result does not preclude SARS-Cov-2 infection and should not be used as the sole basis for treatment or other patient management decisions. A negative result may occur with  improper specimen collection/handling, submission of specimen other than nasopharyngeal swab, presence of viral  mutation(s) within the areas targeted by this assay, and inadequate number of viral copies(<138 copies/mL). A negative result must be combined with clinical observations, patient history, and epidemiological information. The expected result is Negative.  Fact Sheet for Patients:  EntrepreneurPulse.com.au  Fact Sheet for Healthcare Providers:  IncredibleEmployment.be  This test is no t yet approved or cleared by the [place] FDA and  has been authorized for detection and/or diagnosis of SARS-CoV-2 by FDA under an Emergency Use Authorization (EUA). This EUA will remain  in effect (meaning this test can be used) for the duration of the COVID-19 declaration under Section 564(b)(1) of the  Act, 21 U.S.C.section 360bbb-3(b)(1), unless the authorization is terminated  or revoked sooner.       Influenza A by PCR NEGATIVE NEGATIVE Final   Influenza B by PCR NEGATIVE NEGATIVE Final    Comment: (NOTE) The Xpert Xpress SARS-CoV-2/FLU/RSV plus assay is intended as an aid in the diagnosis of influenza from Nasopharyngeal swab specimens and should not be used as a sole basis for treatment. Nasal washings and aspirates are unacceptable for Xpert Xpress SARS-CoV-2/FLU/RSV testing.  Fact Sheet for Patients: EntrepreneurPulse.com.au  Fact Sheet for Healthcare Providers: IncredibleEmployment.be  This test is not yet approved or cleared by the [place] FDA and has been authorized for detection and/or diagnosis of SARS-CoV-2 by FDA under an Emergency Use Authorization (EUA). This EUA will remain in effect (meaning this test can be used) for the duration of the COVID-19 declaration under Section 564(b)(1) of the Act, 21 U.S.C. section 360bbb-3(b)(1), unless the authorization is terminated or revoked.  Performed at [facility], [address], [place], [place] [address]   MRSA PCR Screening     Status: None    Collection Time: 10/02/20 12:58 PM   Specimen: Nasopharyngeal Swab  Result Value Ref Range Status   MRSA by PCR NEGATIVE NEGATIVE Final    Comment:        The GeneXpert MRSA Assay (FDA approved for NASAL specimens only), is one component of a comprehensive MRSA colonization surveillance program. It is not intended to diagnose MRSA infection nor to guide or monitor treatment for MRSA infections. Performed at [facility], [address], [place], [place] [address]      Scheduled Meds: . allopurinol  300 mg Oral Daily  . apixaban  5 mg Oral BID  . atorvastatin  20 mg Oral QPM  . Chlorhexidine Gluconate Cloth  6 each Topical Daily  . donepezil  10 mg Oral QHS  . feeding supplement  237 mL Oral BID BM  . metoprolol tartrate  25 mg Oral BID  . midodrine  10 mg Oral TID WC  . pantoprazole (PROTONIX) IV  40 mg Intravenous Q24H   Continuous Infusions: . sodium chloride    . amiodarone 30 mg/hr (10/06/20 0957)  . ceFEPime (MAXIPIME) IV 2 g (10/06/20 1425)  . lactated ringers 75 mL/hr at 10/05/20 2243    Procedures/Studies: DG Chest 2 View  Result Date: 10/02/2020 CLINICAL DATA:  Pleural effusion. EXAM: CHEST - 2 VIEW COMPARISON:  08/27/2020.  08/26/2020. FINDINGS: Cardiomegaly. No pulmonary venous congestion. Persistent right base atelectasis/infiltrate and right-sided pleural effusion. No interim change. Nodular opacity noted over the left lung base most likely nipple shadow as it was not present on prior recent studies. Symmetric nipple opacity noted over the right chest wall. Degenerative changes lumbar spine and both shoulders. IMPRESSION: 1. Cardiomegaly. No pulmonary venous congestion. 2. Persistent right base atelectasis/infiltrate and right-sided pleural effusion. No interim change. No pneumothorax. Electronically Signed   By: Marcello Moores  Register   On: 10/02/2020 11:05   CT CHEST WO CONTRAST  Result Date: 10/04/2020 CLINICAL DATA:  Abnormal chest x-[place], short of breath, cough  EXAM: CT CHEST WITHOUT CONTRAST TECHNIQUE: Multidetector CT imaging of the chest was performed following the standard protocol without IV contrast. COMPARISON:  10/02/2020, 08/27/2020 FINDINGS: Cardiovascular: Heart is unremarkable without pericardial effusion. Moderate atherosclerosis of the coronary vasculature. Mild atherosclerosis of the aortic arch. Normal caliber of the thoracic aorta. Mediastinum/Nodes: No enlarged mediastinal or axillary lymph nodes. Thyroid gland, trachea, and esophagus demonstrate no significant findings. Lungs/Pleura: There are bilateral  pleural effusions. On the right volume estimated greater than 2 L. On the left, volume estimated less than 500 cc. Compressive atelectasis within the bilateral lower lobes, right greater than left. No pneumothorax. Central airways are patent. Upper Abdomen: No acute abnormality. Musculoskeletal: No acute or destructive bony lesions. Reconstructed images demonstrate no additional findings. IMPRESSION: 1. Bilateral pleural effusions, right greater than left. Compressive atelectasis of the lower lobes. 2. Aortic Atherosclerosis (ICD10-I70.0). Coronary artery atherosclerosis. Electronically Signed   By: Randa Ngo M.D.   On: 10/04/2020 18:25   US Abdomen Complete  Result Date: 10/03/2020 CLINICAL DATA:  Elevated LFTs for 1 week EXAM: ABDOMEN ULTRASOUND COMPLETE COMPARISON:  None. FINDINGS: Gallbladder: Not well visualized Common bile duct: Not well visualized Liver: Diffusely increased in echogenicity. Focal 1 cm cyst is noted in the left lobe of the liver. No biliary ductal dilatation is seen. No other mass is noted. Portal vein is patent on color Doppler imaging with normal direction of blood flow towards the liver. IVC: No abnormality visualized. Pancreas: Visualized portion unremarkable. Spleen: Not well visualized due to overlying bowel gas Right Kidney: Not well visualized. Left Kidney: Length: 10.6 cm. 1.7 cm cyst is noted within the left  kidney. No obstructive changes are seen. Abdominal aorta: No aneurysm visualized. Other findings: Small right pleural effusion is noted. IMPRESSION: Extremely limited exam due to overlying bowel gas. No acute abnormality is seen aside from a small right pleural effusion. Hepatic and renal cysts. Electronically Signed   By: Inez Catalina M.D.   On: 10/03/2020 10:15    Orson Eva, DO  [facility]  If 7PM-7AM, please contact night-coverage [email or web address] Password [email or web address] 10/06/2020, 3:22 PM   LOS: 4 days

## 2020-10-07 DIAGNOSIS — Z7189 Other specified counseling: Secondary | ICD-10-CM

## 2020-10-07 DIAGNOSIS — J449 Chronic obstructive pulmonary disease, unspecified: Secondary | ICD-10-CM

## 2020-10-07 DIAGNOSIS — A419 Sepsis, unspecified organism: Principal | ICD-10-CM

## 2020-10-07 DIAGNOSIS — I5032 Chronic diastolic (congestive) heart failure: Secondary | ICD-10-CM

## 2020-10-07 DIAGNOSIS — R0902 Hypoxemia: Secondary | ICD-10-CM

## 2020-10-07 LAB — CBC WITH DIFFERENTIAL/PLATELET
Abs Immature Granulocytes: 0.03 10*3/uL (ref 0.00–0.07)
Basophils Absolute: 0.1 10*3/uL (ref 0.0–0.1)
Basophils Relative: 1 %
Eosinophils Absolute: 0.7 10*3/uL — ABNORMAL HIGH (ref 0.0–0.5)
Eosinophils Relative: 9 %
HCT: 41.6 % (ref 39.0–52.0)
Hemoglobin: 12.6 g/dL — ABNORMAL LOW (ref 13.0–17.0)
Immature Granulocytes: 0 %
Lymphocytes Relative: 13 %
Lymphs Abs: 1 10*3/uL (ref 0.7–4.0)
MCH: 29 pg (ref 26.0–34.0)
MCHC: 30.3 g/dL (ref 30.0–36.0)
MCV: 95.9 fL (ref 80.0–100.0)
Monocytes Absolute: 0.7 10*3/uL (ref 0.1–1.0)
Monocytes Relative: 9 %
Neutro Abs: 5.5 10*3/uL (ref 1.7–7.7)
Neutrophils Relative %: 68 %
Platelets: 159 10*3/uL (ref 150–400)
RBC: 4.34 MIL/uL (ref 4.22–5.81)
RDW: 17.8 % — ABNORMAL HIGH (ref 11.5–15.5)
WBC: 8 10*3/uL (ref 4.0–10.5)
nRBC: 0 % (ref 0.0–0.2)

## 2020-10-07 LAB — MAGNESIUM: Magnesium: 2 mg/dL (ref 1.7–2.4)

## 2020-10-07 LAB — COMPREHENSIVE METABOLIC PANEL
ALT: 26 U/L (ref 0–44)
AST: 26 U/L (ref 15–41)
Albumin: 2.2 g/dL — ABNORMAL LOW (ref 3.5–5.0)
Alkaline Phosphatase: 93 U/L (ref 38–126)
Anion gap: 7 (ref 5–15)
BUN: 26 mg/dL — ABNORMAL HIGH (ref 8–23)
CO2: 21 mmol/L — ABNORMAL LOW (ref 22–32)
Calcium: 8.3 mg/dL — ABNORMAL LOW (ref 8.9–10.3)
Chloride: 114 mmol/L — ABNORMAL HIGH (ref 98–111)
Creatinine, Ser: 0.81 mg/dL (ref 0.61–1.24)
GFR, Estimated: >60 mL/min (ref >60)
Glucose, Bld: 109 mg/dL — ABNORMAL HIGH (ref 70–99)
Potassium: 3.8 mmol/L (ref 3.5–5.1)
Sodium: 142 mmol/L (ref 135–145)
Total Bilirubin: 0.4 mg/dL (ref 0.3–1.2)
Total Protein: 5.1 g/dL — ABNORMAL LOW (ref 6.5–8.1)

## 2020-10-07 LAB — GLUCOSE, CAPILLARY
Glucose-Capillary: 122 mg/dL — ABNORMAL HIGH (ref 70–99)
Glucose-Capillary: 123 mg/dL — ABNORMAL HIGH (ref 70–99)
Glucose-Capillary: 101 mg/dL — ABNORMAL HIGH (ref 70–99)
Glucose-Capillary: 114 mg/dL — ABNORMAL HIGH (ref 70–99)

## 2020-10-07 MED ORDER — AMIODARONE IV BOLUS ONLY 150 MG/100ML
150.0000 mg | Freq: Once | INTRAVENOUS | Status: AC
  Administered 2020-10-07: 150 mg via INTRAVENOUS
  Filled 2020-10-07: qty 100

## 2020-10-07 NOTE — Progress Notes (Signed)
PROGRESS NOTE  Eric Bowman VVZ:[id number] DOB: 20-Aug-1933 DOA: 10/02/2020 PCP: Gerlene Fee, NP  Brief History:  86 [facility] significant[facility], gout, CAD, diastolic CHF, GERD, hyperlipidemia, Hypertension, atrial fibrillation on apixaban, on torsemide for CHF, has dementia and apparently has not been eating and drinking well for the past several days. He was noted to be more lethargic and was seen by the NP at the SNF and labs were drawn. He was hypotensive at the time. His labs revealed a severe hypernatremia, hyperkalemia and acute kidney injury. He was sent to the emergency department for further evaluation and treatments.  He was tachycardic on arrival 100.8, pulse 147, BP 78/61, 99% on room air.His labs revealed sodium of 161, potassium 6.4, BUN 105 creatinine 2.16, anion gap 17, CO2 26, glucose 116, AST 60, ALT 38, total bilirubin 1.4, estimated GFR 29, BNP 726, WBC 10.6, hemoglobin 17.4, hematocrit 59.4, INR 2.9, lactic acid 3.9, SARS 2 coronavirus test negative, influenza A and influenza B tests negative. Urinalysis no infection seen, MRSA screen negative. Chest x-Juandaniel with persistent right-sided atelectasis. Cardiomegaly seen on chest x-[place]. PCCM was consulted and she was evaluated by Dr. Melvyn Novas. He discussed the case fully with wife at bedside and she was made aware of how critically ill patient was at this time. She elected a trial of treating with fluids and antibiotics to see if he will have a meaningful recovery. No pressors or central line. He is to remain DNR status.  Assessment/Plan: Septic shock -Secondary to pneumonia -Lactic acid peaked at 3.8 -PCT 0.11>>0.24>>0.20 -Continue empiric cefepime; coursing day #5/7 of treatment today -Vancomycin discontinued in the setting of neg MRSA PCR. -Personally reviewed chest x-Arek--right lower lobe opacity appreciated. -Blood and urine cultures--without  any growth. -Continue supportive care and appropriate oral hydration   Atrial fibrillation with RVR -HR 120-130; up to 140s with minimal activity -He is asymptomatic and denies palpitation feelings. -Continue metoprolol 25 mg twice a day -Continue amiodarone drip -In the setting of uncontrolled heart rate will give extra dose of amiodarone bolus. -Continue telemetry monitoring -Continue apixaban for secondary prevention. -Continue to treat underlying condition and maintain adequate hydration and normal electrolytes. -Mild blood pressure.  Utilize primary service on Cardizem.  Hypernatremia -Presented with sodium 161 -Improved/resolved with hypotonic fluid -Sodium currently 142 -Continue to follow electrolytes trend.  Chronic diastolic CHF -Appears clinically euvolemic and with compensated condition. -Continue holding torsemide temporarily due to hypotension. -07/27/20 Echo--EF 60-65%, mild decrease RV function; trivial pericardial effusion -Continue to follow daily weights and strict I's and O's.  Acute kidney injury -Baseline creatinine 0.7-0.9 -Presented with serum creatinine 2.16 -Patient advised to maintain adequate oral hydration -IV fluids has been discontinued. -Cr 0.81 currently  Hyperkalemia -Initially treated withLokelma -With subsequent hypokalemia; see below. -Overall improved -Continue telemetry monitoring.  Hyperlipidemia -Continue statin  Dementia without behavioral disturbance -Continue Aricept -Continue reorientation and supportive care.  Hypokalemia -Within normal limits currently -Continue to follow electrolytes according to keep potassium above 4 -Magnesium within normal limits.  Goals of Care -while patient has had clinical improvement, I anticipate several more days of hospitalization with high likelihood of recurrent/frequent hospitalizations -family met with palliative care service for goals of care discussion and at this time plan  is to continue current course of intervention.  Patient remains DNR.     Status is: Inpatient    Dispo: The patient is from:SNF Anticipated d/c is  to:SNF Anticipated d/c date is: To be determined. Patient currently is not medically stable to d/c.  Still with ongoing atrial fibrillation and requiring heparin drip.  Also completing IV antibiotics.  Continue supportive care.     Family Communication:No family at bedside.  Consultants:Cardiology service curbside; palliative care.  Code Status: DNR  DVT Prophylaxis:apixaban   Procedures: As Listed in Progress Note Above  Antibiotics: Cefepime 12/3>>anticipated last dose 10/15/2020 vanco 12/3>>12/5   Subjective: Patient is afebrile, denies chest pain, no nausea, no vomiting, no palpitations.  Reports improvement in his breathing.    Objective: Vitals:   10/07/20 0600 10/07/20 0700 10/07/20 0730 10/07/20 1114  BP: 101/70 105/71    Pulse: (!) 42 (!) 142 (!) 140   Resp: (!) 22 15 (!) 27 (!) 32  Temp: 99.1 F (37.3 C) 98.8 F (37.1 C) 98.8 F (37.1 C) 98.4 F (36.9 C)  TempSrc:   Bladder Bladder  SpO2: 97% 96% 96%   Weight:      Height:        Intake/Output Summary (Last 24 hours) at 10/07/2020 1214 Last data filed at 10/07/2020 0500 Gross per 24 hour  Intake 2088.58 ml  Output 1150 ml  Net 938.58 ml   Weight change:   Exam: General exam: Alert, awake, oriented x 1; denies chest pain, reports improvement in his breathing denies palpitation.  Currently afebrile. Respiratory system: Positive scattered rhonchi bilaterally; no wheezing.  No using accessory muscle.  Good O2 sat on room air. Cardiovascular system: Irregular irregular, no rubs, no gallops, no JVD on exam. Gastrointestinal system: Abdomen is nondistended, soft and nontender. No organomegaly or masses felt. Normal bowel sounds heard. Central nervous system: No focal neurological  deficits. Extremities: No cyanosis or clubbing.  1+ edema appreciated bilaterally. Skin: No petechiae. Psychiatry: Mood & affect appropriate.    Data Reviewed: I have personally reviewed following labs and imaging studies  Basic Metabolic Panel: Recent Labs  Lab 10/03/20 0415 10/04/20 0316 10/05/20 0448 10/06/20 0446 10/07/20 0336  NA 149* 142 142 143 142  K 4.2 3.5 3.2* 3.6 3.8  CL 118* 114* 115* 115* 114*  CO2 21* 19* 21* 22 21*  GLUCOSE 105* 131* 113* 99 109*  BUN 82* 64* 45* 32* 26*  CREATININE 1.73* 1.30* 1.10 0.88 0.81  CALCIUM 8.4* 8.2* 8.3* 8.3* 8.3*  MG 2.1 1.7 2.2 2.1 2.0   Liver Function Tests: Recent Labs  Lab 10/03/20 0415 10/04/20 0316 10/05/20 0448 10/06/20 0446 10/07/20 0336  AST 70* 40 _0 ALT 46* 34 32 29 26  ALKPHOS 102 85 88 87 93  BILITOT 0.7 0.6 0.7 0.4 0.4  PROT 5.6* 4.8* 4.9* 4.8* 5.1*  ALBUMIN 2.8* 2.3* 2.3* 2.1* 2.2*   Coagulation Profile: Recent Labs  Lab 10/02/20 1207  INR 2.9*   CBC: Recent Labs  Lab 10/03/20 0415 10/04/20 0316 10/05/20 0448 10/06/20 0446 10/07/20 0336  WBC 12.1* 8.6 6.9 6.9 8.0  NEUTROABS 8.8* 6.5 4.8 4.5 5.5  HGB 14.4 12.6* 12.7* 12.4* 12.6*  HCT 50.1 42.7 41.6 42.5 41.6  MCV 101.2* 96.8 95.4 97.7 95.9  PLT 224 172 161 155 159   CBG: Recent Labs  Lab 10/06/20 1125 10/06/20 1644 10/06/20 2308 10/07/20 0336 10/07/20 1115  GLUCAP 132* 122* 123* 101* 114*   Urine analysis:    Component Value Date/Time   COLORURINE YELLOW 10/02/2020 1155   APPEARANCEUR CLEAR 10/02/2020 1155   LABSPEC 1.012 10/02/2020 1155   PHURINE 5.0 10/02/2020 1155  GLUCOSEU NEGATIVE 10/02/2020 [place] 10/02/2020 [place] 10/02/2020 1155   [place] 10/02/2020 1155   PROTEINUR NEGATIVE 10/02/2020 1155   NITRITE NEGATIVE 10/02/2020 1155   LEUKOCYTESUR NEGATIVE 10/02/2020 1155   Sepsis Labs:  Recent Results (from the past 240 hour(s))  Blood Culture (routine x 2)      Status: None (Preliminary result)   Collection Time: 10/02/20 12:20 PM   Specimen: BLOOD RIGHT WRIST  Result Value Ref Range Status   Specimen Description BLOOD RIGHT WRIST DRAWN BY RN  Final   Special Requests   Final    BOTTLES DRAWN AEROBIC AND ANAEROBIC Blood Culture adequate volume   Culture   Final    NO GROWTH 3 DAYS Performed at [facility], [address], [place], [place] [address]    Report Status PENDING  Incomplete  Urine culture     Status: None   Collection Time: 10/02/20 12:20 PM   Specimen: In/Out Cath Urine  Result Value Ref Range Status   Specimen Description   Final    IN/OUT CATH URINE Performed at [facility], [address] [place], [place] [address]    Special Requests   Final    NONE Performed at [facility], [address], [place], [place] [address]    Culture   Final    NO GROWTH Performed at [facility], [place] [address], [place], [place] [address]    Report Status 10/05/2020 FINAL  Final  Blood Culture (routine x 2)     Status: None (Preliminary result)   Collection Time: 10/02/20 12:25 PM   Specimen: BLOOD LEFT ARM  Result Value Ref Range Status   Specimen Description BLOOD LEFT ARM DRAWN BY RN  Final   Special Requests   Final    BOTTLES DRAWN AEROBIC AND ANAEROBIC Blood Culture adequate volume   Culture   Final    NO GROWTH 3 DAYS Performed at [facility], [address], [place], [place] [address]    Report Status PENDING  Incomplete  Resp Panel by RT-PCR (Flu A&B, Covid) Nasopharyngeal Swab     Status: None   Collection Time: 10/02/20 12:46 PM   Specimen: Nasopharyngeal Swab; Nasopharyngeal(NP) swabs in vial transport medium  Result Value Ref Range Status   SARS Coronavirus 2 by RT PCR NEGATIVE NEGATIVE Final    Comment: (NOTE) SARS-CoV-2 target nucleic acids are NOT DETECTED.  The SARS-CoV-2 RNA is generally detectable in upper respiratory specimens during the acute phase of infection. The lowest concentration of SARS-CoV-2  viral copies this assay can detect is 138 copies/mL. A negative result does not preclude SARS-Cov-2 infection and should not be used as the sole basis for treatment or other patient management decisions. A negative result may occur with  improper specimen collection/handling, submission of specimen other than nasopharyngeal swab, presence of viral mutation(s) within the areas targeted by this assay, and inadequate number of viral copies(<138 copies/mL). A negative result must be combined with clinical observations, patient history, and epidemiological information. The expected result is Negative.  Fact Sheet for Patients:  EntrepreneurPulse.com.au  Fact Sheet for Healthcare Providers:  IncredibleEmployment.be  This test is no t yet approved or cleared by the [place] FDA and  has been authorized for detection and/or diagnosis of SARS-CoV-2 by FDA under an Emergency Use Authorization (EUA). This EUA will remain  in effect (meaning this test can be used) for the duration of the COVID-19 declaration under Section 564(b)(1) of  the Act, 21 U.S.C.section 360bbb-3(b)(1), unless the authorization is terminated  or revoked sooner.       Influenza A by PCR NEGATIVE NEGATIVE Final   Influenza B by PCR NEGATIVE NEGATIVE Final    Comment: (NOTE) The Xpert Xpress SARS-CoV-2/FLU/RSV plus assay is intended as an aid in the diagnosis of influenza from Nasopharyngeal swab specimens and should not be used as a sole basis for treatment. Nasal washings and aspirates are unacceptable for Xpert Xpress SARS-CoV-2/FLU/RSV testing.  Fact Sheet for Patients: EntrepreneurPulse.com.au  Fact Sheet for Healthcare Providers: IncredibleEmployment.be  This test is not yet approved or cleared by the [place] FDA and has been authorized for detection and/or diagnosis of SARS-CoV-2 by FDA under an Emergency Use Authorization  (EUA). This EUA will remain in effect (meaning this test can be used) for the duration of the COVID-19 declaration under Section 564(b)(1) of the Act, 21 U.S.C. section 360bbb-3(b)(1), unless the authorization is terminated or revoked.  Performed at [facility], [address] [place], [place] [address]   MRSA PCR Screening     Status: None   Collection Time: 10/02/20 12:58 PM   Specimen: Nasopharyngeal Swab  Result Value Ref Range Status   MRSA by PCR NEGATIVE NEGATIVE Final    Comment:        The GeneXpert MRSA Assay (FDA approved for NASAL specimens only), is one component of a comprehensive MRSA colonization surveillance program. It is not intended to diagnose MRSA infection nor to guide or monitor treatment for MRSA infections. Performed at [facility], [address] [place], [place] [address]      Scheduled Meds: . allopurinol  300 mg Oral Daily  . apixaban  5 mg Oral BID  . atorvastatin  20 mg Oral QPM  . Chlorhexidine Gluconate Cloth  6 each Topical Daily  . donepezil  10 mg Oral QHS  . feeding supplement  237 mL Oral BID BM  . metoprolol tartrate  25 mg Oral BID  . midodrine  10 mg Oral TID WC  . pantoprazole (PROTONIX) IV  40 mg Intravenous Q24H   Continuous Infusions: . sodium chloride    . amiodarone 30 mg/hr (10/07/20 0500)  . amiodarone    . ceFEPime (MAXIPIME) IV Stopped (10/06/20 2310)  . lactated ringers 75 mL/hr at 10/07/20 0500    Procedures/Studies: DG Chest 2 View  Result Date: 10/02/2020 CLINICAL DATA:  Pleural effusion. EXAM: CHEST - 2 VIEW COMPARISON:  08/27/2020.  08/26/2020. FINDINGS: Cardiomegaly. No pulmonary venous congestion. Persistent right base atelectasis/infiltrate and right-sided pleural effusion. No interim change. Nodular opacity noted over the left lung base most likely nipple shadow as it was not present on prior recent studies. Symmetric nipple opacity noted over the right chest wall. Degenerative changes lumbar spine and  both shoulders. IMPRESSION: 1. Cardiomegaly. No pulmonary venous congestion. 2. Persistent right base atelectasis/infiltrate and right-sided pleural effusion. No interim change. No pneumothorax. Electronically Signed   By: Marcello Moores  Register   On: 10/02/2020 11:05   CT CHEST WO CONTRAST  Result Date: 10/04/2020 CLINICAL DATA:  Abnormal chest x-[place]dean, short of breath, cough EXAM: CT CHEST WITHOUT CONTRAST TECHNIQUE: Multidetector CT imaging of the chest was performed following the standard protocol without IV contrast. COMPARISON:  10/02/2020, 08/27/2020 FINDINGS: Cardiovascular: Heart is unremarkable without pericardial effusion. Moderate atherosclerosis of the coronary vasculature. Mild atherosclerosis of the aortic arch. Normal caliber of the thoracic aorta. Mediastinum/Nodes: No enlarged mediastinal or axillary lymph nodes. Thyroid gland, trachea, and esophagus demonstrate no significant  findings. Lungs/Pleura: There are bilateral pleural effusions. On the right volume estimated greater than 2 L. On the left, volume estimated less than 500 cc. Compressive atelectasis within the bilateral lower lobes, right greater than left. No pneumothorax. Central airways are patent. Upper Abdomen: No acute abnormality. Musculoskeletal: No acute or destructive bony lesions. Reconstructed images demonstrate no additional findings. IMPRESSION: 1. Bilateral pleural effusions, right greater than left. Compressive atelectasis of the lower lobes. 2. Aortic Atherosclerosis (ICD10-I70.0). Coronary artery atherosclerosis. Electronically Signed   By: Randa Ngo M.D.   On: 10/04/2020 18:25   US Abdomen Complete  Result Date: 10/03/2020 CLINICAL DATA:  Elevated LFTs for 1 week EXAM: ABDOMEN ULTRASOUND COMPLETE COMPARISON:  None. FINDINGS: Gallbladder: Not well visualized Common bile duct: Not well visualized Liver: Diffusely increased in echogenicity. Focal 1 cm cyst is noted in the left lobe of the liver. No biliary ductal  dilatation is seen. No other mass is noted. Portal vein is patent on color Doppler imaging with normal direction of blood flow towards the liver. IVC: No abnormality visualized. Pancreas: Visualized portion unremarkable. Spleen: Not well visualized due to overlying bowel gas Right Kidney: Not well visualized. Left Kidney: Length: 10.6 cm. 1.7 cm cyst is noted within the left kidney. No obstructive changes are seen. Abdominal aorta: No aneurysm visualized. Other findings: Small right pleural effusion is noted. IMPRESSION: Extremely limited exam due to overlying bowel gas. No acute abnormality is seen aside from a small right pleural effusion. Hepatic and renal cysts. Electronically Signed   By: Inez Catalina M.D.   On: 10/03/2020 10:15    Barton Dubois, MD  [facility]  If 7PM-7AM, please contact night-coverage   10/07/2020, 12:14 PM   LOS: 5 days

## 2020-10-07 NOTE — Progress Notes (Signed)
    Asked by Dr. Dyann Kief to review the patient's chart as he remains in atrial fibrillation with RVR despite being on IV Amiodarone. He has a known history of atrial fibrillation and suspect his elevated rates are secondary to his acute illness as he is currently admitted with septic shock in the setting of PNA. Palliative Medicine consult is pending for Goals of Care discussions as he does have underlying dementia. He was recently restarted on PO Lopressor and this was titrated to 25mg  BID yesterday. Was on Cardizem CD 120mg  daily and Toprol-XL 50mg  daily PTA but currently held given hypotension. He has been continued on Midodrine 10mg  TID.   I reviewed with Dr. Harl Bowie and can rebolus IV Amiodarone 150mg  if needed and if elevated rates continue to be an issue, would load with IV Digoxin 0.25mg  Q6H for 3 doses. Hopefully BP and HR will continue to improve with treatment of his underlying illness.   Signed, Erma Heritage, PA-C 10/07/2020, 11:59 AM Pager: [phone]

## 2020-10-07 NOTE — Consult Note (Signed)
Consultation Note Date: 10/07/2020   Patient Name: Eric Bowman  DOB: 09-25-1933  MRN: [id number]  Age / Sex: 86 y.o., male  PCP: Gerlene Fee, NP Referring Physician: Barton Dubois, MD  Reason for Consultation: Establishing goals of care  HPI/Patient Profile: 86 y.o. male  with past medical history of dementia, bedbound status living at [facility], coronary artery disease, diastolic congestive heart failure, hyperlipidemia, hypertension, A. fib, admitted on 10/02/2020 with increasing lethargy and decreasing p.o. intake.  Work-up revealed electrolyte imbalance, dehydration, congestive heart failure exacerbation, septic shock secondary to pneumonia, A. fib with RVR.  He required IV phenylephrine for hypotension.  He has improved minimally over the past week per report from attending physician.  Palliative medicine consulted for goals of care.  Clinical Assessment and Goals of Care: I evaluated patient and met with his daughter and his spouse. 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  Education has been very important to their family.  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  He is known for portraying [employer]. Reviewed patient's current illness and possible trajectories with the spouse and daughter.  Spouse shares that patient's doctor met with them last night and shared his feelings that patient was not going to improve beyond his current state. We discussed goals of care, we discussed advanced care planning, we discussed continuing aggressive medical measures including IV fluid and IV antibiotics and prolonged hospitalization.  We discussed addition to full comfort care measures only and possible hospice care. Mrs. Smestad requested that I attempt to discuss these  options with the patient.  Zamire is very hard of hearing and has dementia.  However I did attempt a goals of care conversation with him.  He is very good at covering up his dementia.  However, when I asked him to elicit to me what his options were, he was unable to. I explained to his spouse and daughter that he did not appear to have capacity in this moment to make this decision. His daughter Helene Kelp, expressed that if patient were in his right mind he would likely choose comfort care and hospice.  His spouse Tamela Oddi, was understandably very emotional and had difficulty expressing an answer. At the close of our conversation I gave them my contact information, encouraged them to talk to each other to assist in decision making.  Primary Decision Maker NEXT OF KIN- spouse Tamela Oddi- with assistance of daughterHelene Kelp    SUMMARY OF RECOMMENDATIONS -Continue current scope of care -Patient and daughter to discuss options and either notify palliative medicine team or primary team of their decision  Code Status/Advance Care Planning:  DNR  Palliative Prophylaxis:   Delirium Protocol and Frequent Pain Assessment  Prognosis:    Unable to determine  Discharge Planning: To Be Determined  Primary Diagnoses: Present on Admission: . Vascular dementia without behavioral disturbance ([place]) . Pleural effusion . Hypertensive heart disease with  chronic diastolic congestive heart failure ([place]) . Hypernatremia . Hyperkalemia . Acute renal failure ([place]) . Shock circulatory ([place]) . Hypotension . Severe dehydration . Lactic acidosis . Physical deconditioning . GERD . Dyslipidemia . Coronary artery disease . COPD with hypoxia ([place]) . Chronic gout without tophus . Chronic diastolic (congestive) heart failure ([place]) . Atrial fibrillation with rapid ventricular response ([place]) . DNR (do not resuscitate)   I have reviewed the medical record, interviewed the patient and family, and examined the patient. The  following aspects are pertinent.  Past Medical History:  Diagnosis Date  . Cancer (HCC)    melanoma-left side  . Chronic pain    legs and feet  . Coronary artery disease   . Dementia ([place])   . Diabetes mellitus without complication ([place])   . Foot drop   . GERD (gastroesophageal reflux disease)   . Gout   . Hyperlipidemia   . Hypertension   . OCD (obsessive compulsive disorder)   . PONV (postoperative nausea and vomiting)   . Psychosis ([place])   . Sarcoidosis   . Sleep apnea    cpap   Social History   Socioeconomic History  . Marital status: Married    Spouse name: Not on file  . Number of children: Not on file  . Years of education: Not on file  . Highest education level: Not on file  Occupational History  . Occupation: retired   Tobacco Use  . Smoking status: Former Smoker    Packs/day: 0.25    Years: 20.00    Pack years: 5.00    Types: Pipe    Quit date: 01/02/1974    Years since quitting: 46.7  . Smokeless tobacco: Never Used  Vaping Use  . Vaping Use: Never used  Substance and Sexual Activity  . Alcohol use: No  . Drug use: No  . Sexual activity: Not Currently  Other Topics Concern  . Not on file  Social History Narrative   Former pipe smoker, quit many years ago.   [facility]      Social Determinants of Health   Financial Resource Strain:   . Difficulty of Paying Living Expenses: Not on file  Food Insecurity:   . Worried About Charity fundraiser in the Last Year: Not on file  . Ran Out of Food in the Last Year: Not on file  Transportation Needs:   . Lack of Transportation (Medical): Not on file  . Lack of Transportation (Non-Medical): Not on file  Physical Activity:   . Days of Exercise per Week: Not on file  . Minutes of Exercise per Session: Not on file  Stress:   . Feeling of Stress : Not on file  Social Connections:   . Frequency of Communication with Friends and Family: Not on file  . Frequency of Social Gatherings with  Friends and Family: Not on file  . Attends Religious Services: Not on file  . Active Member of Clubs or Organizations: Not on file  . Attends Archivist Meetings: Not on file  . Marital Status: Not on file   Scheduled Meds: . allopurinol  300 mg Oral Daily  . apixaban  5 mg Oral BID  . atorvastatin  20 mg Oral QPM  . Chlorhexidine Gluconate Cloth  6 each Topical Daily  . donepezil  10 mg Oral QHS  . feeding supplement  237 mL Oral BID BM  . metoprolol tartrate  25 mg Oral BID  .  midodrine  10 mg Oral TID WC  . pantoprazole (PROTONIX) IV  40 mg Intravenous Q24H   Continuous Infusions: . sodium chloride    . amiodarone 30 mg/hr (10/07/20 0500)  . ceFEPime (MAXIPIME) IV Stopped (10/06/20 2310)  . lactated ringers 75 mL/hr at 10/07/20 0500   PRN Meds:.acetaminophen **OR** acetaminophen, ipratropium-albuterol, ondansetron **OR** ondansetron (ZOFRAN) IV, traZODone Medications Prior to Admission:  Prior to Admission medications   Medication Sig Start Date End Date Taking? Authorizing Provider  acetaminophen (TYLENOL) 500 MG tablet Take 500 mg by mouth every 6 (six) hours as needed. For pain    Yes [provider]  allopurinol (ZYLOPRIM) 300 MG tablet Take 300 mg by mouth daily.     Yes [provider]  apixaban (ELIQUIS) 5 MG TABS tablet Take 5 mg by mouth 2 (two) times daily. 07/21/20  Yes [provider]  atorvastatin (LIPITOR) 20 MG tablet Take 20 mg by mouth every evening. 06/21/20  Yes [provider]  Janne Lab [facility]) OINT Special Instructions: Apply to sacrum/coccyx and bilateral buttocks qshift prn erythema. 09/27/19  Yes [provider]  cetirizine (ZYRTEC) 10 MG tablet Take 10 mg by mouth at bedtime. 06/25/20  Yes [provider]  diltiazem (CARDIZEM CD) 120 MG 24 hr capsule Take 1 capsule (120 mg total) by mouth daily. 08/27/20 08/27/21 Yes Emokpae, Courage, MD  donepezil (ARICEPT) 10 MG tablet Take 10  mg by mouth at bedtime.  02/19/14  Yes [provider]  gabapentin (NEURONTIN) 100 MG capsule Take 100 mg by mouth at bedtime.   Yes [provider]  ipratropium-albuterol (DUONEB) 0.5-2.5 (3) MG/3ML SOLN Take 3 mLs by nebulization every 6 (six) hours as needed. 07/15/20  Yes [provider]  metoprolol succinate (TOPROL-XL) 50 MG 24 hr tablet Take 1 tablet (50 mg total) by mouth daily. 08/27/20  Yes Emokpae, Courage, MD  midodrine (PROAMATINE) 10 MG tablet Take 1 tablet (10 mg total) by mouth 3 (three) times daily with meals. 08/27/20  Yes Roxan Hockey, MD  NON FORMULARY Pureed NAS diet with nectar thick liquids 08/02/20  Yes [provider]  OXYGEN Inhale 2 L into the lungs continuous. for shortness of breath to keep 02 sat above 90% 08/01/20  Yes [provider]  pantoprazole (PROTONIX) 40 MG tablet Take 1 tablet (40 mg total) by mouth daily. 06/22/19  Yes Sinda Du, MD  potassium chloride SA (KLOR-CON) 20 MEQ tablet Take 20 mEq by mouth 2 (two) times daily.   Yes [provider]  torsemide (DEMADEX) 20 MG tablet Take 20 mg by mouth 2 (two) times daily.   Yes [provider]  Vitamins A & D (VITAMIN A & D) ointment Apply A&D ointment to bilateral heels qshift. Every Shift Day, Evening, Night 08/27/20  Yes [provider]  Amino Acids-Protein Hydrolys (FEEDING SUPPLEMENT, PRO-STAT SUGAR FREE 64,) LIQD Take 30 mLs by mouth in the morning and at bedtime. 08/20/20   [provider]   Allergies  Allergen Reactions  . Other   . Penicillin G     Pt tolerated cefepime on 10/02/20 with no issues.  . Sulfonic Acid (3,5-Dibromo-4-H-Ox-Benz)   . Penicillins Rash    Pt tolerated cefepime on 10/02/20 with no issues.  . Sulfonamide Derivatives Rash  . Tetanus Toxoid Rash   Review of Systems  Unable to perform ROS: Dementia    Physical Exam Vitals and nursing note reviewed.  Cardiovascular:     Comments:  anasarca Pulmonary:  Effort: Pulmonary effort is normal.  Skin:    Coloration: Skin is pale.  Neurological:     Mental Status: He is alert.     Motor: Weakness present.     Vital Signs: BP 105/71   Pulse (!) 140   Temp 98.4 F (36.9 C) (Bladder)   Resp (!) 32   Ht 5' 10"  (1.778 m)   Wt 83.2 kg   SpO2 96%   BMI 26.32 kg/m  Pain Scale: 0-10 POSS *See Group Information*: 1-Acceptable,Awake and alert Pain Score: 0-No pain   SpO2: SpO2: 96 % O2 Device:SpO2: 96 % O2 Flow Rate: .O2 Flow Rate (L/min): 2 L/min  IO: Intake/output summary:   Intake/Output Summary (Last 24 hours) at 10/07/2020 1114 Last data filed at 10/07/2020 0500 Gross per 24 hour  Intake 2088.58 ml  Output 1150 ml  Net 938.58 ml    LBM: Last BM Date: 10/05/20 Baseline Weight: Weight: 79.4 kg Most recent weight: Weight: 83.2 kg     Palliative Assessment/Data: PPS: 30%     Thank you for this consult. Palliative medicine will continue to follow and assist as needed.   Time In:1002 Time Out:1114 Time Total: 72 minutes Greater than 50%  of this time was spent counseling and coordinating care related to the above assessment and plan.  Signed by: Mariana Kaufman, AGNP-C Palliative Medicine    Please contact Palliative Medicine Team phone at [phone] for questions and concerns.  For individual provider: See Shea [facility]

## 2020-10-08 DIAGNOSIS — Z515 Encounter for palliative care: Secondary | ICD-10-CM

## 2020-10-08 DIAGNOSIS — Z66 Do not resuscitate: Secondary | ICD-10-CM

## 2020-10-08 LAB — GLUCOSE, CAPILLARY
Glucose-Capillary: 100 mg/dL — ABNORMAL HIGH (ref 70–99)
Glucose-Capillary: 144 mg/dL — ABNORMAL HIGH (ref 70–99)

## 2020-10-08 LAB — CULTURE, BLOOD (ROUTINE X 2)
Culture: NO GROWTH
Culture: NO GROWTH
Special Requests: ADEQUATE
Special Requests: ADEQUATE

## 2020-10-08 MED ORDER — POLYVINYL ALCOHOL 1.4 % OP SOLN: 1.0000 [drp] | Freq: Four times a day (QID) | OPHTHALMIC | Status: DC | PRN

## 2020-10-08 MED ORDER — MORPHINE SULFATE (CONCENTRATE) 10 MG/0.5ML PO SOLN: 5.0000 mg | ORAL | Status: DC | PRN

## 2020-10-08 MED ORDER — DILTIAZEM HCL ER COATED BEADS 120 MG PO CP24
120.0000 mg | ORAL_CAPSULE | Freq: Every day | ORAL | Status: DC
  Administered 2020-10-08: 120 mg via ORAL
  Filled 2020-10-08: qty 1

## 2020-10-08 MED ORDER — LORAZEPAM 2 MG/ML PO CONC: 1.0000 mg | ORAL | Status: DC | PRN

## 2020-10-08 MED ORDER — HALOPERIDOL LACTATE 2 MG/ML PO CONC
0.5000 mg | ORAL | Status: DC | PRN
  Filled 2020-10-08: qty 0.3

## 2020-10-08 MED ORDER — LORAZEPAM 2 MG/ML IJ SOLN: 1.0000 mg | INTRAMUSCULAR | Status: DC | PRN

## 2020-10-08 MED ORDER — GLYCOPYRROLATE 0.2 MG/ML IJ SOLN: 0.2000 mg | INTRAMUSCULAR | Status: DC | PRN

## 2020-10-08 MED ORDER — HALOPERIDOL LACTATE 5 MG/ML IJ SOLN: 0.5000 mg | INTRAMUSCULAR | Status: DC | PRN

## 2020-10-08 MED ORDER — BIOTENE DRY MOUTH MT LIQD: 15.0000 mL | OROMUCOSAL | Status: DC | PRN

## 2020-10-08 MED ORDER — GLYCOPYRROLATE 1 MG PO TABS: 1.0000 mg | ORAL_TABLET | ORAL | Status: DC | PRN

## 2020-10-08 MED ORDER — HALOPERIDOL 0.5 MG PO TABS: 0.5000 mg | ORAL_TABLET | ORAL | Status: DC | PRN

## 2020-10-08 MED ORDER — LORAZEPAM 1 MG PO TABS: 1.0000 mg | ORAL_TABLET | ORAL | Status: DC | PRN

## 2020-10-08 NOTE — Progress Notes (Signed)
Daily Progress Note   Patient Name: Eric Bowman       Date: 10/08/2020 DOB: 07/16/1933  Age: 86 y.o. MRN#: [id number] Attending Physician: Barton Dubois, MD Primary Care Physician: Gerlene Fee, NP Admit Date: 10/02/2020  Reason for Consultation/Follow-up: Establishing goals of care  Subjective: Patient continues to have a-fib with RVR resulting from his sepsis.  I evaluated patient at bedside- he arouses easily.  Received call from patient's daughter- Macon Large.  They have made decision to transition patient to full comfort measures only and focus on comfort and quality.  They would like patient to be evaluated for possible placement at residential Hospice home.   Review of Systems  All other systems reviewed and are negative.   Length of Stay: 6  Current Medications: Scheduled Meds:  . allopurinol  300 mg Oral Daily  . Chlorhexidine Gluconate Cloth  6 each Topical Daily  . diltiazem  120 mg Oral Daily  . donepezil  10 mg Oral QHS  . feeding supplement  237 mL Oral BID BM  . metoprolol tartrate  25 mg Oral BID  . pantoprazole (PROTONIX) IV  40 mg Intravenous Q24H    Continuous Infusions: . sodium chloride      PRN Meds: acetaminophen **OR** acetaminophen, antiseptic oral rinse, glycopyrrolate **OR** glycopyrrolate **OR** glycopyrrolate, haloperidol **OR** haloperidol **OR** haloperidol lactate, ipratropium-albuterol, LORazepam **OR** LORazepam **OR** LORazepam, morphine CONCENTRATE **OR** morphine CONCENTRATE, ondansetron **OR** ondansetron (ZOFRAN) IV, polyvinyl alcohol, traZODone  Physical Exam Vitals and nursing note reviewed.  Constitutional:      Comments: Awake, falls asleep easily   Cardiovascular:     Comments: anasarca Neurological:     Comments:  Very hard of hearing             Vital Signs: BP 108/88   Pulse (!) 139   Temp 97.8 F (36.6 C) (Oral)   Resp (!) 35   Ht 5\' 10"  (1.778 m)   Wt 83.2 kg   SpO2 97%   BMI 26.32 kg/m  SpO2: SpO2: 97 % O2 Device: O2 Device: Room Air O2 Flow Rate: O2 Flow Rate (L/min): 2 L/min  Intake/output summary:   Intake/Output Summary (Last 24 hours) at 10/08/2020 1157 Last data filed at 10/08/2020 0200 Gross per 24 hour  Intake 704.68 ml  Output 450 ml  Net  254.68 ml   LBM: Last BM Date: 10/07/20 Baseline Weight: Weight: 79.4 kg Most recent weight: Weight: 83.2 kg       Palliative Assessment/Data: PPS: 30%      Patient Active Problem List   Diagnosis Date Noted  . Severe sepsis with septic shock ([place]) 10/04/2020  . Hyperkalemia 10/02/2020  . Shock circulatory ([place]) 10/02/2020  . Severe dehydration 10/02/2020  . Lactic acidosis 10/02/2020  . DNR (do not resuscitate) 10/02/2020  . Encounter for annual wellness visit (AWV) in Medicare patient 09/18/2020  . Hypertensive heart disease with chronic diastolic congestive heart failure ([place]) 08/26/2020  . Acute renal failure ([place]) 08/25/2020  . Hypernatremia 08/25/2020  . Chronic diastolic (congestive) heart failure ([place]) 08/14/2020  . Dysphagia 08/05/2020  . Hypotension 08/04/2020  . COPD with hypoxia ([place]) 08/04/2020  . Bilateral lower extremity edema 08/04/2020  . S/P thoracentesis   . Atrial fibrillation with rapid ventricular response ([place]) 07/27/2020  . History of sarcoidosis 07/22/2020  . History of melanoma 07/22/2020  . HCAP (healthcare-associated pneumonia) 07/20/2020  . Pleural effusion 07/20/2020  . Elevated brain natriuretic peptide (BNP) level 07/20/2020  . Cellulitis of abdominal wall 07/07/2020  . Chronic non-seasonal allergic rhinitis 06/26/2020  . Weight gain 06/11/2020  . Weight loss 12/17/2019  . Poliomyelitis osteopathy of lower leg, right ([place]) 11/01/2019  . COVID-19 10/15/2019  . Radiculopathy of  cervical spine 09/09/2019  . Dyslipidemia 06/24/2019  . Vascular dementia without behavioral disturbance ([place]) 06/24/2019  . Chronic gout without tophus 06/24/2019  . Physical deconditioning 06/24/2019  . Multiple falls 06/20/2019  . Arthritis of knee, left 01/03/2013  . Degenerative disc disease, lumbar 01/03/2013  . Coronary artery disease 09/13/2011  . Obstructive sleep apnea on CPAP 09/10/2007  . Essential hypertension 05/02/2007  . GERD 05/02/2007    Palliative Care Assessment & Plan   Patient Profile: 86 y.o. male  with past medical history of dementia, bedbound status living at [facility], coronary artery disease, diastolic congestive heart failure, hyperlipidemia, hypertension, A. fib, admitted on 10/02/2020 with increasing lethargy and decreasing p.o. intake.  Work-up revealed electrolyte imbalance, dehydration, congestive heart failure exacerbation, septic shock secondary to pneumonia, A. fib with RVR.  He required IV phenylephrine for hypotension.  He has improved minimally over the past week per report from attending physician.  Palliative medicine consulted for goals of care.  Assessment/Recommendations/Plan   Transition to full comfort care- d/c IV amiodorone, cardiac monitoring and other interventions and medications that are not intended for comfort  Family requested evaluation for residential hospice- I am unsure if he will qualify- he seems to be eating and drinking well for now- however, given that we plan to stop IV antibiotics, IV amiodorone, and IV fluids- and he continues to have a-fib with RVR which were previously dropping his pressures before amiodorone and midodrine were started- I am concerned that his pneumonia and sepsis physiology will reoccur quickly as well as his heart will deteriorate as well given his poor reserve likely leading to his last days within the next weeks to months. Will ask hospice for evaluation. Discussed with attending and agree it  may be better to stop current interventions and see how he tolerates this before deciding on prognosis.   Goals of Care and Additional Recommendations:  Limitations on Scope of Treatment: Full Comfort Care  Code Status:  DNR  Prognosis:   < 3 months- likely weeks to months- will be better to determine once he has been on full comfort measures  only  Discharge Planning:  To Be Determined  Care plan was discussed with family and attending physician.   Thank you for allowing the Palliative Medicine Team to assist in the care of this patient.   Total time: 38 minutes  Greater than 50%  of this time was spent counseling and coordinating care related to the above assessment and plan.  Mariana Kaufman, AGNP-C Palliative Medicine   Please contact Palliative Medicine Team phone at [phone] for questions and concerns.

## 2020-10-08 NOTE — TOC Progression Note (Signed)
Transition of Care [facility]) - Progression Note    Patient Details  Name: Eric Bowman MRN: [id number] Date of Birth: June 17, 1933  Transition of [facility]) CM/SW Contact  Ihor Gully, LCSW Phone Number: 10/08/2020, 4:39 PM  Clinical Narrative:    Per palliative care consult, referral made to [facility]. Confirmed with wife as choice and decision.    Expected Discharge Plan: [place] Barriers to Discharge: Continued Medical Work up  Expected Discharge Plan and Services Expected Discharge Plan: [place] In-house Referral: Clinical Social Work,Hospice / [facility] Acute Care Choice: Nursing Home Living arrangements for the past 2 months: [place]                                       Social Determinants of Health (SDOH) Interventions    Readmission Risk Interventions Readmission Risk Prevention Plan 10/05/2020 08/01/2020  Post Dischage Appt - Complete  Medication Screening - Complete  Transportation Screening Complete Complete  Medication Review ([place]) Complete -  PCP or Specialist appointment within 3-5 days of discharge Complete -  [place] or Home Care Consult Complete -  SW Recovery Care/Counseling Consult Complete -  Palliative Care Screening (No Data) -  [place] Complete -  Some recent data might be hidden

## 2020-10-08 NOTE — Progress Notes (Signed)
PROGRESS NOTE  Eric Bowman OIN:[id number] DOB: [birth year] DOA: 10/02/2020 PCP: Gerlene Fee, NP  Brief History:  86 [facility] significant[facility], gout, CAD, diastolic CHF, GERD, hyperlipidemia, Hypertension, atrial fibrillation on apixaban, on torsemide for CHF, has dementia and apparently has not been eating and drinking well for the past several days. He was noted to be more lethargic and was seen by the NP at the SNF and labs were drawn. He was hypotensive at the time. His labs revealed a severe hypernatremia, hyperkalemia and acute kidney injury. He was sent to the emergency department for further evaluation and treatments.  He was tachycardic on arrival 100.8, pulse 147, BP 78/61, 99% on room air.His labs revealed sodium of 161, potassium 6.4, BUN 105 creatinine 2.16, anion gap 17, CO2 26, glucose 116, AST 60, ALT 38, total bilirubin 1.4, estimated GFR 29, BNP 726, WBC 10.6, hemoglobin 17.4, hematocrit 59.4, INR 2.9, lactic acid 3.9, SARS 2 coronavirus test negative, influenza A and influenza B tests negative. Urinalysis no infection seen, MRSA screen negative. Chest x-[place] with persistent right-sided atelectasis. Cardiomegaly seen on chest x-[place]. PCCM was consulted and she was evaluated by Dr. Melvyn Novas. He discussed the case fully with wife at bedside and she was made aware of how critically ill patient was at this time. She elected a trial of treating with fluids and antibiotics to see if he will have a meaningful recovery. No pressors or central line. He is to remain DNR status.  Assessment/Plan: Septic shock -Secondary to pneumonia -Lactic acid peaked at 3.8 -PCT 0.11>>0.24>>0.20 -Continue empiric cefepime; coursing day #6/7 of treatment today -Vancomycin discontinued in the setting of neg MRSA PCR. -Personally reviewed chest x-Adyn--right lower lobe opacity appreciated. -Blood and urine cultures--without  any growth. -Continue supportive care and appropriate oral hydration   Atrial fibrillation with RVR -HR 120-130 remains elevated at rest with further increase with minimal activity.  Patient is asymptomatic. -Continue metoprolol 25 mg twice a day -Continue amiodarone drip -As patient blood pressure has demonstrated mild improvement we will try it Cardizem extended release 120 mg and follow response. -Continue telemetry monitoring. -Continue apixaban for secondary prevention. -Continue to treat underlying condition and maintain adequate hydration and normal electrolytes.  Hypernatremia -Presented with sodium 161 -Improved/resolved with hypotonic fluid -Sodium currently 142 -Continue to follow electrolytes trend.  Chronic diastolic CHF -Appears clinically euvolemic and with compensated condition. -Continue holding torsemide temporarily due to hypotension. -07/27/20 Echo--EF 60-65%, mild decrease RV function; trivial pericardial effusion -Continue to follow daily weights and strict I's and O's.  Acute kidney injury -Baseline creatinine 0.7-0.9 -Presented with serum creatinine 2.16 -Patient advised to maintain adequate oral hydration -IV fluids has been discontinued. -Cr 0.81 currently  Hyperkalemia -Initially treated withLokelma -With subsequent hypokalemia; see below. -Overall improved -Continue telemetry monitoring.  Hyperlipidemia -Continue statin  Dementia without behavioral disturbance -Continue Aricept -Continue reorientation and supportive care.  Hypokalemia -Within normal limits currently -Continue to follow electrolytes according to keep potassium above 4 -Magnesium within normal limits.  Hypotension  -Continue midodrine -Will place order for TED hoses.  Goals of Care -while patient has had clinical improvement, I anticipate several more days of hospitalization with high likelihood of recurrent/frequent hospitalizations -Patient remains DNR -Will  continue to follow further recommendations by palliative care service and further discussion with family.   Status is: Inpatient    Dispo: The patient is from:SNF Anticipated d/c is to:SNF Anticipated  d/c date is: To be determined. Patient currently is not medically stable to d/c.  Still with ongoing atrial fibrillation and requiring heparin drip.  Also completing IV antibiotics.  Continue supportive care and follow-up palliative care service recommendations..     Family Communication:No family at bedside.  Consultants:Cardiology service curbside; palliative care.  Code Status: DNR  DVT Prophylaxis:apixaban   Procedures: As Listed in Progress Note Above  Antibiotics: Cefepime 12/3>>anticipated last dose 10/17/2020 vanco 12/3>>12/5   Subjective: No fever, no chest pain, no nausea, no vomiting, no palpitation.  Patient reports improvement in his breathing and is in no distress currently.  On telemetry evaluation patient continued to have sustained atrial fibrillation with elevated heart rate despite amiodarone drip.  Objective: Vitals:   10/08/20 0900 10/08/20 1000 10/08/20 1100 10/08/20 1606  BP:  108/81 108/88 114/77  Pulse: (!) 117 (!) 147 (!) 139 68  Resp: (!) 21 (!) 39 (!) 35 17  Temp:    98.1 F (36.7 C)  TempSrc:    Oral  SpO2: 98% 99% 97% 93%  Weight:      Height:        Intake/Output Summary (Last 24 hours) at 10/08/2020 1629 Last data filed at 10/08/2020 0200 Gross per 24 hour  Intake 704.68 ml  Output 450 ml  Net 254.68 ml   Weight change:   Exam: General exam: Alert, awake, oriented x 1; expressing no chest pain and denying shortness of breath currently.  Good oxygen saturation on room air and having breakfast. Respiratory system: Positive scattered rhonchi; no wheezing, no using accessory muscles. Cardiovascular system: Irregular irregular, no rubs, no gallops, no JVD. Gastrointestinal  system: Abdomen is nondistended, soft and nontender. No organomegaly or masses felt. Normal bowel sounds heard. Central nervous system: No focal neurological deficits. Extremities: No cyanosis or clubbing; trace to 1+ edema appreciated bilaterally. Skin: No petechiae. Psychiatry: Judgement and insight appear impaired in the setting of underlying dementia; mood and affect appropriate at this time.  No distress or agitation.   Data Reviewed: I have personally reviewed following labs and imaging studies  Basic Metabolic Panel: Recent Labs  Lab 10/03/20 0415 10/04/20 0316 10/05/20 0448 10/06/20 0446 10/07/20 0336  NA 149* 142 142 143 142  K 4.2 3.5 3.2* 3.6 3.8  CL 118* 114* 115* 115* 114*  CO2 21* 19* 21* 22 21*  GLUCOSE 105* 131* 113* 99 109*  BUN 82* 64* 45* 32* 26*  CREATININE 1.73* 1.30* 1.10 0.88 0.81  CALCIUM 8.4* 8.2* 8.3* 8.3* 8.3*  MG 2.1 1.7 2.2 2.1 2.0   Liver Function Tests: Recent Labs  Lab 10/03/20 0415 10/04/20 0316 10/05/20 0448 10/06/20 0446 10/07/20 0336  AST 70* 40 31 29 26   ALT 46* 34 32 29 26  ALKPHOS 102 85 88 87 93  BILITOT 0.7 0.6 0.7 0.4 0.4  PROT 5.6* 4.8* 4.9* 4.8* 5.1*  ALBUMIN 2.8* 2.3* 2.3* 2.1* 2.2*   Coagulation Profile: Recent Labs  Lab 10/02/20 1207  INR 2.9*   CBC: Recent Labs  Lab 10/03/20 0415 10/04/20 0316 10/05/20 0448 10/06/20 0446 10/07/20 0336  WBC 12.1* 8.6 6.9 6.9 8.0  NEUTROABS 8.8* 6.5 4.8 4.5 5.5  HGB 14.4 12.6* 12.7* 12.4* 12.6*  HCT 50.1 42.7 41.6 42.5 41.6  MCV 101.2* 96.8 95.4 97.7 95.9  PLT 224 172 161 155 159   CBG: Recent Labs  Lab 10/06/20 2308 10/07/20 0336 10/07/20 1115 10/07/20 2354 10/08/20 1130  GLUCAP 123* 101* 114* 100* 144*   Urine analysis:  Component Value Date/Time   COLORURINE YELLOW 10/02/2020 1155   APPEARANCEUR CLEAR 10/02/2020 1155   LABSPEC 1.012 10/02/2020 1155   PHURINE 5.0 10/02/2020 1155   GLUCOSEU NEGATIVE 10/02/2020 1155   HGBUR NEGATIVE 10/02/2020 [place] 10/02/2020 1155   [place] 10/02/2020 1155   PROTEINUR NEGATIVE 10/02/2020 1155   NITRITE NEGATIVE 10/02/2020 1155   LEUKOCYTESUR NEGATIVE 10/02/2020 1155   Sepsis Labs:  Recent Results (from the past 240 hour(s))  Blood Culture (routine x 2)     Status: None   Collection Time: 10/02/20 12:20 PM   Specimen: BLOOD RIGHT WRIST  Result Value Ref Range Status   Specimen Description BLOOD RIGHT WRIST DRAWN BY RN  Final   Special Requests   Final    BOTTLES DRAWN AEROBIC AND ANAEROBIC Blood Culture adequate volume   Culture   Final    NO GROWTH 6 DAYS Performed at [facility], [address], [place], [place] [address]    Report Status 10/08/2020 FINAL  Final  Urine culture     Status: None   Collection Time: 10/02/20 12:20 PM   Specimen: In/Out Cath Urine  Result Value Ref Range Status   Specimen Description   Final    IN/OUT CATH URINE Performed at [facility], [address] [place], [place] [address]    Special Requests   Final    NONE Performed at [facility], [address], [place], [place] [address]    Culture   Final    NO GROWTH Performed at [facility], [place] [address], [place], [place] [address]    Report Status 10/05/2020 FINAL  Final  Blood Culture (routine x 2)     Status: None   Collection Time: 10/02/20 12:25 PM   Specimen: BLOOD LEFT ARM  Result Value Ref Range Status   Specimen Description BLOOD LEFT ARM DRAWN BY RN  Final   Special Requests   Final    BOTTLES DRAWN AEROBIC AND ANAEROBIC Blood Culture adequate volume   Culture   Final    NO GROWTH 6 DAYS Performed at [facility], [address], [place], [place] [address]    Report Status 10/08/2020 FINAL  Final  Resp Panel by RT-PCR (Flu A&B, Covid) Nasopharyngeal Swab     Status: None   Collection Time: 10/02/20 12:46 PM   Specimen: Nasopharyngeal Swab; Nasopharyngeal(NP) swabs in vial transport medium  Result Value Ref Range Status   SARS Coronavirus 2 by RT  PCR NEGATIVE NEGATIVE Final    Comment: (NOTE) SARS-CoV-2 target nucleic acids are NOT DETECTED.  The SARS-CoV-2 RNA is generally detectable in upper respiratory specimens during the acute phase of infection. The lowest concentration of SARS-CoV-2 viral copies this assay can detect is 138 copies/mL. A negative result does not preclude SARS-Cov-2 infection and should not be used as the sole basis for treatment or other patient management decisions. A negative result may occur with  improper specimen collection/handling, submission of specimen other than nasopharyngeal swab, presence of viral mutation(s) within the areas targeted by this assay, and inadequate number of viral copies(<138 copies/mL). A negative result must be combined with clinical observations, patient history, and epidemiological information. The expected result is Negative.  Fact Sheet for Patients:  EntrepreneurPulse.com.au  Fact Sheet for Healthcare Providers:  IncredibleEmployment.be  This test is no t yet approved or cleared by the [place] FDA and  has been authorized for detection and/or diagnosis of SARS-CoV-2 by FDA under an Emergency  Use Authorization (EUA). This EUA will remain  in effect (meaning this test can be used) for the duration of the COVID-19 declaration under Section 564(b)(1) of the Act, 21 U.S.C.section 360bbb-3(b)(1), unless the authorization is terminated  or revoked sooner.       Influenza A by PCR NEGATIVE NEGATIVE Final   Influenza B by PCR NEGATIVE NEGATIVE Final    Comment: (NOTE) The Xpert Xpress SARS-CoV-2/FLU/RSV plus assay is intended as an aid in the diagnosis of influenza from Nasopharyngeal swab specimens and should not be used as a sole basis for treatment. Nasal washings and aspirates are unacceptable for Xpert Xpress SARS-CoV-2/FLU/RSV testing.  Fact Sheet for Patients: EntrepreneurPulse.com.au  Fact Sheet for  Healthcare Providers: IncredibleEmployment.be  This test is not yet approved or cleared by the [place] FDA and has been authorized for detection and/or diagnosis of SARS-CoV-2 by FDA under an Emergency Use Authorization (EUA). This EUA will remain in effect (meaning this test can be used) for the duration of the COVID-19 declaration under Section 564(b)(1) of the Act, 21 U.S.C. section 360bbb-3(b)(1), unless the authorization is terminated or revoked.  Performed at [facility], [address], [place], [place] [address]   MRSA PCR Screening     Status: None   Collection Time: 10/02/20 12:58 PM   Specimen: Nasopharyngeal Swab  Result Value Ref Range Status   MRSA by PCR NEGATIVE NEGATIVE Final    Comment:        The GeneXpert MRSA Assay (FDA approved for NASAL specimens only), is one component of a comprehensive MRSA colonization surveillance program. It is not intended to diagnose MRSA infection nor to guide or monitor treatment for MRSA infections. Performed at [facility], [address] [place], [place] [address]      Scheduled Meds: . allopurinol  300 mg Oral Daily  . Chlorhexidine Gluconate Cloth  6 each Topical Daily  . donepezil  10 mg Oral QHS  . feeding supplement  237 mL Oral BID BM  . pantoprazole (PROTONIX) IV  40 mg Intravenous Q24H   Continuous Infusions: . sodium chloride      Procedures/Studies: DG Chest 2 View  Result Date: 10/02/2020 CLINICAL DATA:  Pleural effusion. EXAM: CHEST - 2 VIEW COMPARISON:  08/27/2020.  08/26/2020. FINDINGS: Cardiomegaly. No pulmonary venous congestion. Persistent right base atelectasis/infiltrate and right-sided pleural effusion. No interim change. Nodular opacity noted over the left lung base most likely nipple shadow as it was not present on prior recent studies. Symmetric nipple opacity noted over the right chest wall. Degenerative changes lumbar spine and both shoulders. IMPRESSION: 1.  Cardiomegaly. No pulmonary venous congestion. 2. Persistent right base atelectasis/infiltrate and right-sided pleural effusion. No interim change. No pneumothorax. Electronically Signed   By: Marcello Moores  Register   On: 10/02/2020 11:05   CT CHEST WO CONTRAST  Result Date: 10/04/2020 CLINICAL DATA:  Abnormal chest x-[place], short of breath, cough EXAM: CT CHEST WITHOUT CONTRAST TECHNIQUE: Multidetector CT imaging of the chest was performed following the standard protocol without IV contrast. COMPARISON:  10/02/2020, 08/27/2020 FINDINGS: Cardiovascular: Heart is unremarkable without pericardial effusion. Moderate atherosclerosis of the coronary vasculature. Mild atherosclerosis of the aortic arch. Normal caliber of the thoracic aorta. Mediastinum/Nodes: No enlarged mediastinal or axillary lymph nodes. Thyroid gland, trachea, and esophagus demonstrate no significant findings. Lungs/Pleura: There are bilateral pleural effusions. On the right volume estimated greater than 2 L. On the left, volume estimated less than 500 cc. Compressive atelectasis within the bilateral lower lobes, right greater than left.  No pneumothorax. Central airways are patent. Upper Abdomen: No acute abnormality. Musculoskeletal: No acute or destructive bony lesions. Reconstructed images demonstrate no additional findings. IMPRESSION: 1. Bilateral pleural effusions, right greater than left. Compressive atelectasis of the lower lobes. 2. Aortic Atherosclerosis (ICD10-I70.0). Coronary artery atherosclerosis. Electronically Signed   By: Randa Ngo M.D.   On: 10/04/2020 18:25   US Abdomen Complete  Result Date: 10/03/2020 CLINICAL DATA:  Elevated LFTs for 1 week EXAM: ABDOMEN ULTRASOUND COMPLETE COMPARISON:  None. FINDINGS: Gallbladder: Not well visualized Common bile duct: Not well visualized Liver: Diffusely increased in echogenicity. Focal 1 cm cyst is noted in the left lobe of the liver. No biliary ductal dilatation is seen. No other mass is  noted. Portal vein is patent on color Doppler imaging with normal direction of blood flow towards the liver. IVC: No abnormality visualized. Pancreas: Visualized portion unremarkable. Spleen: Not well visualized due to overlying bowel gas Right Kidney: Not well visualized. Left Kidney: Length: 10.6 cm. 1.7 cm cyst is noted within the left kidney. No obstructive changes are seen. Abdominal aorta: No aneurysm visualized. Other findings: Small right pleural effusion is noted. IMPRESSION: Extremely limited exam due to overlying bowel gas. No acute abnormality is seen aside from a small right pleural effusion. Hepatic and renal cysts. Electronically Signed   By: Inez Catalina M.D.   On: 10/03/2020 10:15    Barton Dubois, MD  [facility]  If 7PM-7AM, please contact night-coverage   10/08/2020, 4:29 PM   LOS: 6 days

## 2020-10-08 NOTE — Progress Notes (Signed)
Telemetry reviewed, afib rates remain elevated. Currently on amio gtt, dilt 120mg , lopressor 25mg  bid. Bp's seem to be improving, reasonable to try starting oral dilt this AM. If low bp issues and ongoing elevated HRs would transition to IV digoxin 0.25mg  IV every 6 hours x 3 doses. HRs around 110 would be reasonable given his sepsis and expected systemic stress/tachycardia.    Carlyle Dolly MD

## 2020-10-08 NOTE — Progress Notes (Signed)
Palliative Medicine RN Note: Rec'd a call from pt's daughter Macon Large [phone]). She reports that pt's brother Tafari Humiston is not supposed to be allowed to visit but is at the bedside. I confirmed w NP Mariana Kaufman that this was requested by the family, then I called the ICU nurses station to have them have Jim removed. I also called the [facility] front desk and updated them.  Once that was done, I called Helene Kelp back to let her know.  Marjie Skiff Shakoya Gilmore, RN, BSN, [facility] Palliative Medicine Team 10/08/2020 2:41 PM Office [phone]

## 2020-10-08 NOTE — Progress Notes (Signed)
Received call from Palliative Care regarding restricted visitors, specifically the patient's brother. No prior information was given regarding the patient's brother is to not be allowed to visit. The visitor and his wife were asked to leave per the patient and his wife's request. Tilda Burrow, RN

## 2020-10-09 ENCOUNTER — Inpatient Hospital Stay
Admission: RE | Admit: 2020-10-09 | Discharge: 2020-10-31 | Disposition: E | Payer: Medicare Other | Source: Ambulatory Visit | Attending: Internal Medicine | Admitting: Internal Medicine

## 2020-10-09 DIAGNOSIS — I4891 Unspecified atrial fibrillation: Secondary | ICD-10-CM

## 2020-10-09 LAB — SARS CORONAVIRUS 2 BY RT PCR (HOSPITAL ORDER, PERFORMED IN ~~LOC~~ HOSPITAL LAB): SARS Coronavirus 2: NEGATIVE

## 2020-10-09 MED ORDER — PANTOPRAZOLE SODIUM 40 MG PO TBEC
40.0000 mg | DELAYED_RELEASE_TABLET | Freq: Every day | ORAL | Status: DC
  Administered 2020-10-09: 40 mg via ORAL
  Filled 2020-10-09: qty 1

## 2020-10-09 MED ORDER — LORAZEPAM 1 MG PO TABS: 1.0000 mg | ORAL_TABLET | ORAL | 0 refills | Status: AC | PRN

## 2020-10-09 MED ORDER — POTASSIUM CHLORIDE CRYS ER 20 MEQ PO TBCR: EXTENDED_RELEASE_TABLET | ORAL | Status: AC

## 2020-10-09 MED ORDER — TORSEMIDE 20 MG PO TABS: ORAL_TABLET | ORAL | Status: DC

## 2020-10-09 MED ORDER — GLYCOPYRROLATE 1 MG PO TABS: 1.0000 mg | ORAL_TABLET | ORAL | Status: AC | PRN

## 2020-10-09 MED ORDER — HALOPERIDOL 0.5 MG PO TABS: 0.5000 mg | ORAL_TABLET | ORAL | Status: AC | PRN

## 2020-10-09 MED ORDER — MORPHINE SULFATE (CONCENTRATE) 10 MG/0.5ML PO SOLN: 5.0000 mg | ORAL | 0 refills | Status: AC | PRN

## 2020-10-09 NOTE — Progress Notes (Signed)
Patient to discharge back to [facility]. Called report to [place], receiving nurse. Patient and wife (at bedside) aware of discharge plan. Wife updated by TOC. Patient in stable condition awaiting nurse tech assistance to transfer to [facility].

## 2020-10-09 NOTE — Progress Notes (Signed)
Cardiology has been followeing peripherally for afib with RVR. From palliative team notes patient has transitioned to comfort care, we will no longer follow at this time and defer management to palliative and primary team   Carlyle Dolly MD

## 2020-10-09 NOTE — Progress Notes (Signed)
Patient discharged to [facility]. Transported via w/c accompanied by nurse techs. Wife at bedside at time of discharge.

## 2020-10-09 NOTE — Discharge Summary (Addendum)
Physician Discharge Summary  Eric Bowman PJK:[id number] DOB: 1932-11-29 DOA: 10/02/2020  PCP: Gerlene Fee, NP  Admit date: 10/02/2020 Discharge date: 10/16/2020  Time spent: 35 minutes  Recommendations for Outpatient Follow-up:  -Medications to be provided as prescribed as long as patient able to take it by mouth. -Dysphagia 2 diet with nectar thick liquids -Comfort care and symptomatic management -Hospice follow-up at facility with anticipated no future hospitalizations.   Discharge Diagnoses:  Active Problems:   Obstructive sleep apnea on CPAP   GERD   Coronary artery disease   Multiple falls   Dyslipidemia   Vascular dementia without behavioral disturbance (HCC)   Chronic gout without tophus   Physical deconditioning   Pleural effusion   History of sarcoidosis   Atrial fibrillation with rapid ventricular response (HCC)   Hypotension   Chronic resp failure due to COPD with hypoxia (HCC)   Dysphagia   Chronic diastolic (congestive) heart failure (HCC)   Acute renal failure (HCC)   Hypernatremia   Hypertensive heart disease with chronic diastolic congestive heart failure (HCC)   Hyperkalemia   Shock circulatory (HCC)   Severe dehydration   Lactic acidosis   DNR (do not resuscitate)   Severe sepsis with septic shock ([place])   Discharge Condition: Stable.  Discharge back to skilled nursing facility with hospice follow-up.  CODE STATUS: DNR.  Diet recommendation: Dysphagia 2 diet with nectar thick liquids; watch amount of sodium.  Filed Weights   10/02/20 1141 10/02/20 1644 10/08/20 0500  Weight: 79.4 kg 83.2 kg 83.2 kg    History of present illness:  86 [facility] significant[facility], gout, CAD, diastolic CHF, GERD, hyperlipidemia, Hypertension, atrial fibrillation on apixaban, on torsemide for CHF, has dementia and apparently has not been eating and drinking well for the past several days. He was noted to  be more lethargic and was seen by the NP at the SNF and labs were drawn. He was hypotensive at the time. His labs revealed a severe hypernatremia, hyperkalemia and acute kidney injury. He was sent to the emergency department for further evaluation and treatments.  He was tachycardic on arrival 100.8, pulse 147, BP 78/61, 99% on room air.His labs revealed sodium of 161, potassium 6.4, BUN 105 creatinine 2.16, anion gap 17, CO2 26, glucose 116, AST 60, ALT 38, total bilirubin 1.4, estimated GFR 29, BNP 726, WBC 10.6, hemoglobin 17.4, hematocrit 59.4, INR 2.9, lactic acid 3.9, SARS 2 coronavirus test negative, influenza A and influenza B tests negative. Urinalysis no infection seen, MRSA screen negative. Chest x-Dock with persistent right-sided atelectasis. Cardiomegaly seen on chest x-Corliss. PCCM was consulted and she was evaluated by Dr. Melvyn Novas. He discussed the case fully with wife at bedside and she was made aware of how critically ill patient was at this time. She elected a trial of treating with fluids and antibiotics to see if he will have a meaningful recovery. No pressors or central line. He is to remain DNR status.  Hospital Course:  Septic shock -Secondary to pneumonia -Lactic acid peaked at 3.8 -PCT 0.11>>0.24>>0.20 -Patient completed 6 days of cefepime -No complaining of shortness of breath, coughing spells respiratory distress at time of discharge -Sepsis features essentially resolved. -High risk for further aspiration of future decompensation -Discussion with palliative service for goals of care has elicited decision to pursued symptomatic management only and comfort care.  No future hospitalization and hospice follow-up at a skilled nursing facility. -Patient is long-term resident at the  pain center. -Robinul for excessive secretions, Ativan for agitation and the use of liquid morphine sulfate to assist with comfort has been provided at time of discharge.    Atrial  fibrillation with RVR -HR slightly better but is still irregular at time of discharge -Patient will continue using adjusted dose of metoprolol and Cardizem -He is asymptomatic and denies any palpitations. Patient is asymptomatic. -Plan is to proceed comfort care and symptomatic management; hospice will follow patient up at skilled nursing facility.  Hypernatremia -Presented with sodium 161 -Improved/resolved with hypotonic fluid -Last sodium around 142; stable and within normal limits at time of discharge. -No further anticipated blood work while pursuing for comfort care and symptomatic management only.  Chronic diastolic CHF -Appears clinically euvolemic and with compensated condition. -Continue holding torsemide temporarily due to hypotension. -07/27/20 Echo--EF 60-65%, mild decrease RV function; trivial pericardial effusion -Continue to follow daily weights and strict I's and O's.  Acute kidney injury -Baseline creatinine 0.7-0.9 -Presented with serum creatinine that peaked at 2.16 -Patient advised to maintain adequate oral hydration -Last creatinine 0.81; patient levels resolved after fluid resuscitation provided.  Chronic resp failure due to COPD with hypoxia -patient is currently stable and continue to use intermittently 2L [place] supplementation. -no wheezing currently -continue bronchodilator regimen.  Hyperkalemia -Initially treated withLokelma -With subsequent hypokalemia; see below. -No anticipation for further blood work in the setting of comfort care and symptomatic management only.  Hyperlipidemia -Statin has been discontinued in the setting of comfort care management.  Dementia without behavioral disturbance -Continue Aricept -Continue reorientation and supportive care.  Hypokalemia -Within normal limits at time of discharge -while beginning comfort care care only as main goals of care; no future labs anticipated.   Hypotension  -Continue midodrine  TID -main goal of care is comfort currently.  Goals of Care -while patient has had clinical improvement, I anticipate high chances of future decline and aspiration. -Patient remains DNR -Final discussions by palliative care with family has eluded decision for full comfort care and no future hospitalizations. -Continue to provide medications as long as he is able to take it by mouth and minimize any invasive interventions. -Patient will be discharged back to skilled nursing facility with hospice follow-up. -Main goal of care is to keep patient comfortable.  Procedures: See below for x-Nahmir report  Consultations:  Cardiology service curbside  Palliative care  Discharge Exam: Vitals:   10/08/20 2113 10/15/2020 0452  BP: 100/64 121/87  Pulse: (!) 103 90  Resp: 18 18  Temp: 98.6 F (37 C) 98.3 F (36.8 C)  SpO2: 92% 96%    General: Afebrile, in no acute distress, denies chest pain, palpitations or shortness of breath.  Oriented x1 intermittently.  Good saturation on room air. Cardiovascular: Irregular, no rubs, no gallops, no JVD. Respiratory: No wheezing, no crackles, no using accessory muscle.  Positive scattered rhonchi appreciated bilaterally. Abdomen: Soft, nontender, distended, positive bowel sounds Extremities: Trace to 1+ edema appreciated bilaterally.  No cyanosis or clubbing.  Discharge Instructions   Discharge Instructions    Diet - low sodium heart healthy   Complete by: As directed    Discharge instructions   Complete by: As directed    Patient will be discharged back to skilled nursing facility [facility]) with follow-up by hospice. Symptomatic management and comfort as main priority No further hospitalization Dysphagia 2 with nectar thick liquids. Continue to follow daily weights and strict I's and O's.   Discharge wound care:   Complete by: As directed  Keep area clean and dry and distribute pressure.     Allergies as of 10/01/2020      Reactions    Other    Penicillin G    Pt tolerated cefepime on 10/02/20 with no issues.   Sulfonic Acid (3,5-dibromo-4-h-ox-benz)    Penicillins Rash   Pt tolerated cefepime on 10/02/20 with no issues.   Sulfonamide Derivatives Rash   Tetanus Toxoid Rash      Medication List    STOP taking these medications   atorvastatin 20 MG tablet Commonly known as: LIPITOR   cetirizine 10 MG tablet Commonly known as: ZYRTEC   Eliquis 5 MG Tabs tablet Generic drug: apixaban   feeding supplement (PRO-STAT SUGAR FREE 64) Liqd   gabapentin 100 MG capsule Commonly known as: NEURONTIN     TAKE these medications   acetaminophen 500 MG tablet Commonly known as: TYLENOL Take 500 mg by mouth every 6 (six) hours as needed. For pain   allopurinol 300 MG tablet Commonly known as: ZYLOPRIM Take 300 mg by mouth daily.   diltiazem 120 MG 24 hr capsule Commonly known as: Cardizem CD Take 1 capsule (120 mg total) by mouth daily.   donepezil 10 MG tablet Commonly known as: ARICEPT Take 10 mg by mouth at bedtime.   glycopyrrolate 1 MG tablet Commonly known as: ROBINUL Take 1 tablet (1 mg total) by mouth every 4 (four) hours as needed (excessive secretions).   haloperidol 0.5 MG tablet Commonly known as: HALDOL Take 1 tablet (0.5 mg total) by mouth every 4 (four) hours as needed for agitation (or delirium).   ipratropium-albuterol 0.5-2.5 (3) MG/3ML Soln Commonly known as: DUONEB Take 3 mLs by nebulization every 6 (six) hours as needed.   LORazepam 1 MG tablet Commonly known as: ATIVAN Take 1 tablet (1 mg total) by mouth every 4 (four) hours as needed for anxiety.   metoprolol succinate 50 MG 24 hr tablet Commonly known as: TOPROL-XL Take 1 tablet (50 mg total) by mouth daily.   midodrine 10 MG tablet Commonly known as: PROAMATINE Take 1 tablet (10 mg total) by mouth 3 (three) times daily with meals.   morphine CONCENTRATE 10 MG/0.5ML Soln concentrated solution Take 0.25 mLs (5 mg total) by  mouth every 4 (four) hours as needed for moderate pain or severe pain (or dyspnea).   NON FORMULARY Pureed NAS diet with nectar thick liquids   OXYGEN Inhale 2 L into the lungs continuous. for shortness of breath to keep 02 sat above 90%   pantoprazole 40 MG tablet Commonly known as: PROTONIX Take 1 tablet (40 mg total) by mouth daily.   potassium chloride SA 20 MEQ tablet Commonly known as: KLOR-CON Daily when demadex used. What changed:   how much to take  how to take this  when to take this  additional instructions   torsemide 20 MG tablet Commonly known as: DEMADEX To be given daily PRN for swelling and worsening breathing. What changed:   how much to take  how to take this  when to take this  additional instructions   Venelex Oint Special Instructions: Apply to sacrum/coccyx and bilateral buttocks qshift prn erythema.   vitamin A & D ointment Apply A&D ointment to bilateral heels qshift. Every Shift Day, Evening, Night            Discharge Care Instructions  (From admission, onward)         Start     Ordered   10/17/2020 0000  Discharge  wound care:       Comments: Keep area clean and dry and distribute pressure.   09/30/2020 1247         Allergies  Allergen Reactions  . Other   . Penicillin G     Pt tolerated cefepime on 10/02/20 with no issues.  . Sulfonic Acid (3,5-Dibromo-4-H-Ox-Benz)   . Penicillins Rash    Pt tolerated cefepime on 10/02/20 with no issues.  . Sulfonamide Derivatives Rash  . Tetanus Toxoid Rash      The results of significant diagnostics from this hospitalization (including imaging, microbiology, ancillary and laboratory) are listed below for reference.    Significant Diagnostic Studies: DG Chest 2 View  Result Date: 10/02/2020 CLINICAL DATA:  Pleural effusion. EXAM: CHEST - 2 VIEW COMPARISON:  08/27/2020.  08/26/2020. FINDINGS: Cardiomegaly. No pulmonary venous congestion. Persistent right base  atelectasis/infiltrate and right-sided pleural effusion. No interim change. Nodular opacity noted over the left lung base most likely nipple shadow as it was not present on prior recent studies. Symmetric nipple opacity noted over the right chest wall. Degenerative changes lumbar spine and both shoulders. IMPRESSION: 1. Cardiomegaly. No pulmonary venous congestion. 2. Persistent right base atelectasis/infiltrate and right-sided pleural effusion. No interim change. No pneumothorax. Electronically Signed   By: Marcello Moores  Register   On: 10/02/2020 11:05   CT CHEST WO CONTRAST  Result Date: 10/04/2020 CLINICAL DATA:  Abnormal chest x-Colm, short of breath, cough EXAM: CT CHEST WITHOUT CONTRAST TECHNIQUE: Multidetector CT imaging of the chest was performed following the standard protocol without IV contrast. COMPARISON:  10/02/2020, 08/27/2020 FINDINGS: Cardiovascular: Heart is unremarkable without pericardial effusion. Moderate atherosclerosis of the coronary vasculature. Mild atherosclerosis of the aortic arch. Normal caliber of the thoracic aorta. Mediastinum/Nodes: No enlarged mediastinal or axillary lymph nodes. Thyroid gland, trachea, and esophagus demonstrate no significant findings. Lungs/Pleura: There are bilateral pleural effusions. On the right volume estimated greater than 2 L. On the left, volume estimated less than 500 cc. Compressive atelectasis within the bilateral lower lobes, right greater than left. No pneumothorax. Central airways are patent. Upper Abdomen: No acute abnormality. Musculoskeletal: No acute or destructive bony lesions. Reconstructed images demonstrate no additional findings. IMPRESSION: 1. Bilateral pleural effusions, right greater than left. Compressive atelectasis of the lower lobes. 2. Aortic Atherosclerosis (ICD10-I70.0). Coronary artery atherosclerosis. Electronically Signed   By: Randa Ngo M.D.   On: 10/04/2020 18:25   US Abdomen Complete  Result Date: 10/03/2020 CLINICAL  DATA:  Elevated LFTs for 1 week EXAM: ABDOMEN ULTRASOUND COMPLETE COMPARISON:  None. FINDINGS: Gallbladder: Not well visualized Common bile duct: Not well visualized Liver: Diffusely increased in echogenicity. Focal 1 cm cyst is noted in the left lobe of the liver. No biliary ductal dilatation is seen. No other mass is noted. Portal vein is patent on color Doppler imaging with normal direction of blood flow towards the liver. IVC: No abnormality visualized. Pancreas: Visualized portion unremarkable. Spleen: Not well visualized due to overlying bowel gas Right Kidney: Not well visualized. Left Kidney: Length: 10.6 cm. 1.7 cm cyst is noted within the left kidney. No obstructive changes are seen. Abdominal aorta: No aneurysm visualized. Other findings: Small right pleural effusion is noted. IMPRESSION: Extremely limited exam due to overlying bowel gas. No acute abnormality is seen aside from a small right pleural effusion. Hepatic and renal cysts. Electronically Signed   By: Inez Catalina M.D.   On: 10/03/2020 10:15    Microbiology: Recent Results (from the past 240 hour(s))  Blood Culture (routine x  2)     Status: None   Collection Time: 10/02/20 12:20 PM   Specimen: BLOOD RIGHT WRIST  Result Value Ref Range Status   Specimen Description BLOOD RIGHT WRIST DRAWN BY RN  Final   Special Requests   Final    BOTTLES DRAWN AEROBIC AND ANAEROBIC Blood Culture adequate volume   Culture   Final    NO GROWTH 6 DAYS Performed at [facility], [address] [place], [place] [address]    Report Status 10/08/2020 FINAL  Final  Urine culture     Status: None   Collection Time: 10/02/20 12:20 PM   Specimen: In/Out Cath Urine  Result Value Ref Range Status   Specimen Description   Final    IN/OUT CATH URINE Performed at [facility], [address], [place], [place] [address]    Special Requests   Final    NONE Performed at [facility], [address] [place], [place] [address]    Culture   Final    NO  GROWTH Performed at [facility], [place] [address] [place], [place] [address]    Report Status 10/05/2020 FINAL  Final  Blood Culture (routine x 2)     Status: None   Collection Time: 10/02/20 12:25 PM   Specimen: BLOOD LEFT ARM  Result Value Ref Range Status   Specimen Description BLOOD LEFT ARM DRAWN BY RN  Final   Special Requests   Final    BOTTLES DRAWN AEROBIC AND ANAEROBIC Blood Culture adequate volume   Culture   Final    NO GROWTH 6 DAYS Performed at [facility], [address], [place],  [address]    Report Status 10/08/2020 FINAL  Final  Resp Panel by RT-PCR (Flu A&B, Covid) Nasopharyngeal Swab     Status: None   Collection Time: 10/02/20 12:46 PM   Specimen: Nasopharyngeal Swab; Nasopharyngeal(NP) swabs in vial transport medium  Result Value Ref Range Status   SARS Coronavirus 2 by RT PCR NEGATIVE NEGATIVE Final    Comment: (NOTE) SARS-CoV-2 target nucleic acids are NOT DETECTED.  The SARS-CoV-2 RNA is generally detectable in upper respiratory specimens during the acute phase of infection. The lowest concentration of SARS-CoV-2 viral copies this assay can detect is 138 copies/mL. A negative result does not preclude SARS-Cov-2 infection and should not be used as the sole basis for treatment or other patient management decisions. A negative result may occur with  improper specimen collection/handling, submission of specimen other than nasopharyngeal swab, presence of viral mutation(s) within the areas targeted by this assay, and inadequate number of viral copies(<138 copies/mL). A negative result must be combined with clinical observations, patient history, and epidemiological information. The expected result is Negative.  Fact Sheet for Patients:  EntrepreneurPulse.com.au  Fact Sheet for Healthcare Providers:  IncredibleEmployment.be  This test is no t yet approved or cleared by the [place] FDA and  has been  authorized for detection and/or diagnosis of SARS-CoV-2 by FDA under an Emergency Use Authorization (EUA). This EUA will remain  in effect (meaning this test can be used) for the duration of the COVID-19 declaration under Section 564(b)(1) of the Act, 21 U.S.C.section 360bbb-3(b)(1), unless the authorization is terminated  or revoked sooner.       Influenza A by PCR NEGATIVE NEGATIVE Final   Influenza B by PCR NEGATIVE NEGATIVE Final    Comment: (NOTE) The Xpert Xpress SARS-CoV-2/FLU/RSV plus assay is intended as an aid in the diagnosis of influenza from Nasopharyngeal  swab specimens and should not be used as a sole basis for treatment. Nasal washings and aspirates are unacceptable for Xpert Xpress SARS-CoV-2/FLU/RSV testing.  Fact Sheet for Patients: EntrepreneurPulse.com.au  Fact Sheet for Healthcare Providers: IncredibleEmployment.be  This test is not yet approved or cleared by the [place] FDA and has been authorized for detection and/or diagnosis of SARS-CoV-2 by FDA under an Emergency Use Authorization (EUA). This EUA will remain in effect (meaning this test can be used) for the duration of the COVID-19 declaration under Section 564(b)(1) of the Act, 21 U.S.C. section 360bbb-3(b)(1), unless the authorization is terminated or revoked.  Performed at [facility], [address], [place], [place] [address]   MRSA PCR Screening     Status: None   Collection Time: 10/02/20 12:58 PM   Specimen: Nasopharyngeal Swab  Result Value Ref Range Status   MRSA by PCR NEGATIVE NEGATIVE Final    Comment:        The GeneXpert MRSA Assay (FDA approved for NASAL specimens only), is one component of a comprehensive MRSA colonization surveillance program. It is not intended to diagnose MRSA infection nor to guide or monitor treatment for MRSA infections. Performed at [facility], [address] [place], [place] [address]      Labs: Basic  Metabolic Panel: Recent Labs  Lab 10/03/20 0415 10/04/20 0316 10/05/20 0448 10/06/20 0446 10/07/20 0336  NA 149* 142 142 143 142  K 4.2 3.5 3.2* 3.6 3.8  CL 118* 114* 115* 115* 114*  CO2 21* 19* 21* 22 21*  GLUCOSE 105* 131* 113* 99 109*  BUN 82* 64* 45* 32* 26*  CREATININE 1.73* 1.30* 1.10 0.88 0.81  CALCIUM 8.4* 8.2* 8.3* 8.3* 8.3*  MG 2.1 1.7 2.2 2.1 2.0   Liver Function Tests: Recent Labs  Lab 10/03/20 0415 10/04/20 0316 10/05/20 0448 10/06/20 0446 10/07/20 0336  AST 70* 40 31 29 26   ALT 46* 34 32 29 26  ALKPHOS 102 85 88 87 93  BILITOT 0.7 0.6 0.7 0.4 0.4  PROT 5.6* 4.8* 4.9* 4.8* 5.1*  ALBUMIN 2.8* 2.3* 2.3* 2.1* 2.2*   CBC: Recent Labs  Lab 10/03/20 0415 10/04/20 0316 10/05/20 0448 10/06/20 0446 10/07/20 0336  WBC 12.1* 8.6 6.9 6.9 8.0  NEUTROABS 8.8* 6.5 4.8 4.5 5.5  HGB 14.4 12.6* 12.7* 12.4* 12.6*  HCT 50.1 42.7 41.6 42.5 41.6  MCV 101.2* 96.8 95.4 97.7 95.9  PLT 224 172 161 155 159   BNP (last 3 results) Recent Labs    07/20/20 1200 08/13/20 0858 10/02/20 1019  BNP 540.0* 432.0* 726.0*    CBG: Recent Labs  Lab 10/06/20 2308 10/07/20 0336 10/07/20 1115 10/07/20 2354 10/08/20 1130  GLUCAP 123* 101* 114* 100* 144*    Signed:  Barton Dubois MD.  [facility] 10/27/2020, 12:48 PM

## 2020-10-09 NOTE — TOC Transition Note (Signed)
Transition of Care [facility]) - CM/SW Discharge Note   Patient Details  Name: Eric Bowman MRN: [id number] Date of Birth: [birth year]  Transition of [facility]) CM/SW Contact:  Ihor Gully, LCSW Phone Number: 10/28/2020, 2:44 PM   Clinical Narrative:    Discharge clinicals sent to facility. Patient to discharge to [facility] once rapid COVID test results. Message left for spouse regarding discharge. [facility] to follow patient for palliative services in the facility.    Final next level of care: Skilled Nursing Facility Barriers to Discharge: No Barriers Identified   Patient Goals and CMS Choice Patient states their goals for this hospitalization and ongoing recovery are:: anticipate return to [facility]      Discharge Placement                  Name of family member notified: message left for spouse regarding discharge.    Discharge Plan and Services In-house Referral: Clinical Social Work,Hospice / [place] Acute Care Choice: Nursing Home                      [facility] Agency: [place] Date [place]: 10/08/20 Time HH Agency Contacted: 1600 Representative spoke with at [place]: Larey Seat (confirmed receipt 10/10/20)  Social Determinants of Health (SDOH) Interventions     Readmission Risk Interventions Readmission Risk Prevention Plan 10/05/2020 08/01/2020  Post Dischage Appt - Complete  Medication Screening - Complete  Transportation Screening Complete Complete  Medication Review Press photographer) Complete -  PCP or Specialist appointment within 3-5 days of discharge Complete -  [place] or Home Care Consult Complete -  SW Recovery Care/Counseling Consult Complete -  Palliative Care Screening (No Data) -  [place] Complete -  Some recent data might be hidden

## 2020-10-09 NOTE — Care Management Important Message (Signed)
Important Message  Patient Details  Name: Eric Bowman MRN: [id number] Date of Birth: 11-14-1932   Medicare Important Message Given:  Yes     Tommy Medal 10/07/2020, 4:00 PM

## 2020-10-12 ENCOUNTER — Non-Acute Institutional Stay (SKILLED_NURSING_FACILITY): Admitting: Adult Health

## 2020-10-12 ENCOUNTER — Encounter: Payer: Self-pay | Admitting: Adult Health

## 2020-10-12 DIAGNOSIS — I110 Hypertensive heart disease with heart failure: Secondary | ICD-10-CM

## 2020-10-12 DIAGNOSIS — M1A09X0 Idiopathic chronic gout, multiple sites, without tophus (tophi): Secondary | ICD-10-CM

## 2020-10-12 DIAGNOSIS — J449 Chronic obstructive pulmonary disease, unspecified: Secondary | ICD-10-CM

## 2020-10-12 DIAGNOSIS — Z515 Encounter for palliative care: Secondary | ICD-10-CM

## 2020-10-12 DIAGNOSIS — Z85820 Personal history of malignant melanoma of skin: Secondary | ICD-10-CM

## 2020-10-12 DIAGNOSIS — I5032 Chronic diastolic (congestive) heart failure: Secondary | ICD-10-CM | POA: Diagnosis not present

## 2020-10-12 DIAGNOSIS — I700 Atherosclerosis of aorta: Secondary | ICD-10-CM | POA: Diagnosis not present

## 2020-10-12 DIAGNOSIS — R6521 Severe sepsis with septic shock: Secondary | ICD-10-CM

## 2020-10-12 DIAGNOSIS — I4891 Unspecified atrial fibrillation: Secondary | ICD-10-CM | POA: Diagnosis not present

## 2020-10-12 DIAGNOSIS — N179 Acute kidney failure, unspecified: Secondary | ICD-10-CM

## 2020-10-12 DIAGNOSIS — K219 Gastro-esophageal reflux disease without esophagitis: Secondary | ICD-10-CM

## 2020-10-12 DIAGNOSIS — A419 Sepsis, unspecified organism: Secondary | ICD-10-CM

## 2020-10-12 DIAGNOSIS — M5412 Radiculopathy, cervical region: Secondary | ICD-10-CM

## 2020-10-12 DIAGNOSIS — E785 Hyperlipidemia, unspecified: Secondary | ICD-10-CM

## 2020-10-12 DIAGNOSIS — I2510 Atherosclerotic heart disease of native coronary artery without angina pectoris: Secondary | ICD-10-CM

## 2020-10-12 DIAGNOSIS — F0150 Vascular dementia without behavioral disturbance: Secondary | ICD-10-CM

## 2020-10-12 DIAGNOSIS — R1312 Dysphagia, oropharyngeal phase: Secondary | ICD-10-CM

## 2020-10-12 DIAGNOSIS — G4733 Obstructive sleep apnea (adult) (pediatric): Secondary | ICD-10-CM

## 2020-10-12 DIAGNOSIS — Z9989 Dependence on other enabling machines and devices: Secondary | ICD-10-CM

## 2020-10-12 DIAGNOSIS — R0902 Hypoxemia: Secondary | ICD-10-CM

## 2020-10-12 NOTE — Progress Notes (Signed)
Location:  [facility] Room Number: 127/D Place of Service:  SNF (31)   CODE STATUS: DNR  Allergies  Allergen Reactions   Other    Penicillin G     Pt tolerated cefepime on 10/02/20 with no issues.   Sulfonic Acid (3,5-Dibromo-4-H-Ox-Benz)    Penicillins Rash    Pt tolerated cefepime on 10/02/20 with no issues.   Sulfonamide Derivatives Rash   Tetanus Toxoid Rash    Chief Complaint  Patient presents with   Hospitalization Follow-up    Hospitalization Follow Up     HPI:  He is a 86 year old resident of this facility who has been hospitalized from 10-02-20 through 10/06/2020. He was sent to the ED in acute renal failure. Upon arrival to the ED his blood pressure was low; he was admitted to the hospital with septic shock due to pneumonia; atrial fibrillation. His heart remains irregular but the rate is under control. His status was discussed with his wife. The decisions were made for no further hospitalizations. He will continue his medications as long as he is able to swallow them. The focus of his care will be comfort. He will continue to be followed for his chronic illnesses including: COPD with hypoxia/history of sarcoidosis :   Atrial fibrillation with rapid ventricular response:  Sleep apnea obstructive:  Past Medical History:  Diagnosis Date   Cancer ([place])    melanoma-left side   Chronic pain    legs and feet   Coronary artery disease    Dementia ([place])    Diabetes mellitus without complication (HCC)    Foot drop    GERD (gastroesophageal reflux disease)    Gout    Hyperlipidemia    Hypertension    OCD (obsessive compulsive disorder)    PONV (postoperative nausea and vomiting)    Psychosis ([place])    Sarcoidosis    Sleep apnea    cpap    Past Surgical History:  Procedure Laterality Date   BACK SURGERY     cervical neck   CATARACT EXTRACTION W/PHACO  01/05/2012   Procedure: CATARACT EXTRACTION PHACO AND INTRAOCULAR LENS  PLACEMENT ([place]);  Surgeon: [name] Branch, MD;  Location: [facility];  Service: Ophthalmology;  Laterality: Left;  CDE 18.47   CATARACT EXTRACTION W/PHACO  02/02/2012   Procedure: CATARACT EXTRACTION PHACO AND INTRAOCULAR LENS PLACEMENT ([facility]);  Surgeon: [name] Branch, MD;  Location: [facility];  Service: Ophthalmology;  Laterality: Right;  CDE:15.38   CHOLECYSTECTOMY     hemorhoidectomy     MELANOMA EXCISION     left side-[name]   TONSILLECTOMY      Social History   Socioeconomic History   Marital status: Married    Spouse name: Not on file   Number of children: Not on file   Years of education: Not on file   Highest education level: Not on file  Occupational History   Occupation: retired   Tobacco Use   Smoking status: Former Smoker    Packs/day: 0.25    Years: 20.00    Pack years: 5.00    Types: Pipe    Quit date: 01/02/1974    Years since quitting: 46.8   Smokeless tobacco: Never Used  Vaping Use   Vaping Use: Never used  Substance and Sexual Activity   Alcohol use: No   Drug use: No   Sexual activity: Not Currently  Other Topics Concern   Not on file  Social History Narrative   Former pipe smoker, quit  many years ago.   Long term resident of [facility]      Social Determinants of Health   Financial Resource Strain: Not on file  Food Insecurity: Not on file  Transportation Needs: Not on file  Physical Activity: Not on file  Stress: Not on file  Social Connections: Not on file  Intimate Partner Violence: Not on file   Family History  Problem Relation Age of Onset   Heart disease Other    Arthritis Other    Cancer Other    Anesthesia problems Neg Hx    Hypotension Neg Hx    Malignant hyperthermia Neg Hx    Pseudochol deficiency Neg Hx       VITAL SIGNS BP 132/82    Pulse 90    Temp 98.8 F (37.1 C)    Resp 20    Ht _0  (1.753 m)    Wt 158 lb (71.7 kg)    SpO2 92%    BMI 23.33 kg/m   Outpatient Encounter Medications as of 10/12/2020  Medication  Sig   acetaminophen (TYLENOL) 500 MG tablet Take 500 mg by mouth every 6 (six) hours as needed. For pain   allopurinol (ZYLOPRIM) 300 MG tablet Take 300 mg by mouth daily.   Balsam [place]-Castor Oil [facility]) OINT Special Instructions: Apply to sacrum/coccyx and bilateral buttocks qshift prn erythema.   diltiazem (CARDIZEM CD) 120 MG 24 hr capsule Take 1 capsule (120 mg total) by mouth daily.   donepezil (ARICEPT) 10 MG tablet Take 10 mg by mouth at bedtime.    glycopyrrolate (ROBINUL) 1 MG tablet Take 1 tablet (1 mg total) by mouth every 4 (four) hours as needed (excessive secretions).   haloperidol (HALDOL) 0.5 MG tablet Take 1 tablet (0.5 mg total) by mouth every 4 (four) hours as needed for agitation (or delirium).   ipratropium-albuterol (DUONEB) 0.5-2.5 (3) MG/3ML SOLN Take 3 mLs by nebulization every 6 (six) hours as needed.   LORazepam (ATIVAN) 1 MG tablet Take 1 tablet (1 mg total) by mouth every 4 (four) hours as needed for anxiety.   metoprolol succinate (TOPROL-XL) 50 MG 24 hr tablet Take 1 tablet (50 mg total) by mouth daily.   midodrine (PROAMATINE) 10 MG tablet Take 1 tablet (10 mg total) by mouth 3 (three) times daily with meals.   Morphine Sulfate (MORPHINE CONCENTRATE) 10 MG/0.5ML SOLN concentrated solution Take 0.25 mLs (5 mg total) by mouth every 4 (four) hours as needed for moderate pain or severe pain (or dyspnea).   NON FORMULARY Pureed NAS diet with nectar thick liquids   OXYGEN Inhale 2 L into the lungs continuous. for shortness of breath to keep 02 sat above 90%   pantoprazole (PROTONIX) 40 MG tablet Take 1 tablet (40 mg total) by mouth daily.   potassium chloride SA (KLOR-CON) 20 MEQ tablet Daily when demadex used.   torsemide (DEMADEX) 20 MG tablet To be given daily PRN for swelling and worsening breathing.   Vitamins A & D (VITAMIN A & D) ointment Apply A&D ointment to bilateral heels qshift. Every Shift Day, Evening, Night   No facility-administered  encounter medications on file as of 10/12/2020.     SIGNIFICANT DIAGNOSTIC EXAMS  PREVIOUS;   12-17-19: CT of head and cervical spine:  1. No acute intracranial pathology. Small-vessel white matter disease. 2. No fracture or static subluxation of the cervical spine. 3. Anterior cervical discectomy and fusion of C4 through C6, with incorporation of the disc spaces and bony ankylosis of  the included levels inferior to this through T3. 4. Prominent osteophytes, disc calcifications and calcifications of the ligamentum flavum, which appear to significantly narrow the cervical canal at C3-C4, minimum AP diameter approximately 4 mm.  02-13-20: MRI lumbar spine:  1. L2 fracture involving the anterior wall and superior endplate, likely subacute. The appearance suggest a hyperextension mechanism. No height loss. 2. L3-L4 severe spinal canal stenosis with severe right and moderate left neural foraminal stenosis. 3. T12-L1 moderate spinal canal stenosis and severe bilateral neural foraminal stenosis. 4. L1-L2 and L2-L3 severe right neural foraminal stenosis. 5. L5-S1 moderate bilateral neural foraminal stenosis.   02-13-20: MRI cervical spine:  1. 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 2. C4-6 ACDF without spinal canal stenosis. 3. Mild bilateral C6-7 neural foraminal stenosis.  03-20-20: Myoview:  No diagnostic ST segment changes to indicate ischemia. Small, moderate intensity, apical septal and apical to basal inferolateral defects. The inferolateral defect is fixed and consistent with possible scar and the apical septal defect is partially reversible consistent with a mild ischemic territory.This is a high risk study based on calculated LVEF. Would suggest confirmatory echocardiogram. Nuclear stress EF: 15%  04-10-20 2-d echo:  Left ventricular ejection fraction, by estimation,  is 55 to 60%. The  left ventricle has normal function. The left ventricle has no regional  wall motion abnormalities. Left ventricular diastolic parameters are  consistent with age-related delayed  relaxation (normal).   07-27-20 2-d echo:  Left ventricular ejection fraction, by estimation, is 60 to 65%. The  left ventricle has normal function. There is mild left ventricular hypertrophy   07-28-20: right thoracentesis: Successful ultrasound guided RIGHT thoracentesis yielding 1280 mL of pleural fluid. . 07-31-20: swallow study: Recommend continue with D1/puree diet and NECTAR THICK liquids with meds crushed in puree.   08-27-20: right thoracentesis: A total of approximately 580 mL of slightly cloudy yellow RIGHT pleural fluid was removed.  TODAY  10-02-20: chest x-[place]:  1. Cardiomegaly. No pulmonary venous congestion. 2. Persistent right base atelectasis/infiltrate and right-sided pleural effusion. No interim change. No pneumothorax.  10-03-20: abdominal ultrasound:  Extremely limited exam due to overlying bowel gas. No acute abnormality is seen aside from a small right pleural effusion. Hepatic and renal cysts.  10-03-20: ct of chest:  1. Bilateral pleural effusions, right greater than left. Compressive atelectasis of the lower lobes. 2. Aortic Atherosclerosis Coronary artery atherosclerosis.  LABS REVIEWED PREVIOUS;   10-05-19: wbc 4.8; hgb 11.8; hct 36.2; mcv 89.8 plt 256; glucose 100; bun 18; creat 0.53 ;k+ 3.9; an++ 128; ca 8.6; d-dimer: 3.36 ferritin 965 10-16-19: glucose 96; bun 14; creat 0.47; k+ 3.7; na++ 134; ca 8.5 12-02-19: wbc 5.9; hgb 12.4; hct 38.9; mcv 91.1 plt 227; chol 96; ldl 43; trig 45; hdl 44  01-30-20: chol 96; ldl 43; trig 45; hdl 44  06-11-20: wbc 5.5; hgb 12.5; hct 39.0 mcv 92.6 plt 184; glucose 99; bun 26; creat 0.68; k+ 4.6; na++ 131; ca 8.9 liver normal albumin 3.5 hgb a1c 5.5; tsh 2.617 07-20-20: wbc 5.1; hgb 13.0; hct 41.2; mcv 90.7 plt 176; glucose 110; bun 25;  creat 0.52; k+ 4.5; na++ 129; ca 8.7  07-21-20: wbc 5.7; hgb 13.5; hct 42.0; mcv 91.1 plt 171 glucose 106; bun 21; creat 0.58; k+ 4.1; na++ 132; ca 9.0 07-23-20; glucose 110; bun 20; creat 0.62; k+ 3.4; na++ 133; ca 8.7 liver normal albumin 3.2  07-27-20: wbc  8.4; hgb 13.6; hct 41.4; mcv 90.6 plt 133; glucose 104; bun 29; creat 0.88; k+ 3.5; na++ 138; ca 8.4 liver normal albumin 3.4 tsh 2.324 blood and urine culture: no growth 07-29-20: wbc 8.1; hgb 13.0; hct 42.1; mcv 91.9 plt 145; glucose 101; bun 27; creat 0.81; k+ 3.5; na++ 139; ca 8.4 mag 1.8 08-01-20: wbc 7.4; hgb 14.3; hct 45.8; mcv 91.6 plt 169; glucose 89; bun 24; creat 0.74; k+ 4.7; na++ 140; ca 8.5 mag 1.8  08-13-20: BNP 432.0 08-20-20: glucose 113; bun 81; creat 1.46; k+ 4.2; na++ 150; ca 9.6 08-21-20: glucose 103; bun 66; creat 1.19 k+ 3.8; na++ 147; ca 8.8  08-24-20: glucose 108; bun 43; creat 0.98; k+ 3.6; na++ 144; ca 9.2  08-26-20: wbc 7.4; hgb 14.4; hct 46.5; mcv 92.3 plt 255; glucose 94; bun 33; creat 0.91; k+ 3.8; na++ 144; ca 9.5 liver normal albumin 3.3 08-27-20: wbc 6.5; hgb 14.1; hct 45.7; mcv 92.7 plt 212; glucose 89; bun 26; creat 0.74; k+ 3.4; na++ 142; ca 8.9 liver normal albumin 3.1 LDH 131 10-02-20: wbc 10.6; hgb 17.4; hct 59.4 mcv 98.0 plt 295; glucose 116; bun 105; creat 2.16; k+ 6.4; na++ 161; alk phos 130; ast 60; total bili 1.4 albumin 3.8 BNP 726.0  TODAY  10-02-20: blood and urine culture: no growth 10-03-20: wbc 12.1; hgb 14.4; hct 50.1; mcv 101.2 plt 224; glucose 105; bun 82; creat 1.73; k+ 4.2; na++ 149; ca 8.4 ast 70 alt 46 albumin 2.8; mag 2.1 10-05-20: wbc 6.9; hgb 12.7; hct 41.6; mcv 95.4 plt 161; glucose 113; bun 45; creat 1.10; k+ 3.2; na++ 142; ca 8.3 liver normal albumin 2.3 mag 2.2 10-07-20: wbc 8.0; hgb 12.6; hct 41.6; mcv 95.9 plt 159; glucose 109; bun 26; creat 0.81; k+ 3.8; na++ 142; ca 8.2 liver normal albumin 2.2 mag 2.0    Review of Systems  Constitutional: Negative for malaise/fatigue.   Respiratory: Negative for cough and shortness of breath.   Cardiovascular: Negative for chest pain, palpitations and leg swelling.  Gastrointestinal: Negative for abdominal pain, constipation and heartburn.  Musculoskeletal: Negative for back pain, joint pain and myalgias.  Skin: Negative.   Neurological: Negative for dizziness.  Psychiatric/Behavioral: The patient is not nervous/anxious.      Physical Exam Constitutional:      General: He is not in acute distress.    Appearance: He is well-developed and well-nourished. He is not diaphoretic.  Eyes:     Comments: Bilateral cataract with lens implants      Neck:     Thyroid: No thyromegaly.  Cardiovascular:     Rate and Rhythm: Normal rate. Rhythm irregular.     Pulses: Normal pulses and intact distal pulses.     Heart sounds: Normal heart sounds.  Pulmonary:     Effort: Pulmonary effort is normal. No respiratory distress.     Breath sounds: Rhonchi present.     Comments: Few scattered  Abdominal:     General: Bowel sounds are normal. There is no distension.     Palpations: Abdomen is soft.     Tenderness: There is no abdominal tenderness.  Musculoskeletal:        General: No edema.     Cervical back: Neck supple.     Right lower leg: No edema.     Left lower leg: No edema.     Comments: Is able to move all extremities   Lymphadenopathy:     Cervical: No cervical adenopathy.  Skin:  General: Skin is warm and dry.  Neurological:     Mental Status: He is alert. Mental status is at baseline.  Psychiatric:        Mood and Affect: Mood and affect and mood normal.     ASSESSMENT/ PLAN:  TODAY    1. Septic shock secondary to pneumonia acute renal failure: he has completed abt will continue to monitor his status.   2. COPD with hypoxia/history of sarcoidosis : is without change is 02 dependent; has duoneb every 6 hours as needed  3. Atrial fibrillation with rapid ventricular response: heart rate is stable but  irregular: will continue toprol xl 50 mg daily and cardizem cd 120 mg daily for rate control; will monitor is off eliquis   4. Sleep apnea obstructive: no change; no longer on CPAP due to declining this therapy.   5. Aortic atherosclerosis: stable (CT 10-03-20) will monitor   6. Coronary artery atherosclerosis: is stable will continue toprol xl 50 mg daily   7. 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 Hypotension due to drugs: is stable b/p 132/82 will continue midodrine 10 mg three times daily   9. Right pleural effusion: is stable is status post thoracentesis  08-27-20 will monitor  10. Vascular dementia without behavioral disturbance: is stable wight 158 pounds: will continue aricept 10 mg daily    11. Hypertensive heart disease with chronic diastolic congestive heart failure: is stable b/p 132/82 will continue toprol xl 50 mg daily   12. Chronic diastolic congestive heart failure EF 60-65% (07-27-20) will continue demadex 20 mg with k+ 20 meq daily as needed   13. Radiculopathy of cervical spine: is stable will continue to monitor his status.   14.  Dyslipidemia: is stable LDL 43 will monitor   15. History of melanoma: is stable history of excision left flank  16. GERD without esophagitis: is stable will conintue protonix 40 mg daily  17. Dysphagia oropharyngeal: is stable will continue nectar thick liquids.   18. Chronic idiopathic gout of multiple sites without tophi: is stable will continue allopurinol 300 mg daily   19. Comfort measures: will continue robinul 1 mg every 4 hours as needed for excessive secretions. Will continue the following until 10-22-20: roxanol 5 mg every 4 hours as needed; ativan 1 mg every 4 hours as needed; haldol 0.5 mg every 4 hours as needed.      MD is aware of resident's narcotic use and is in agreement with current plan of care. We will attempt to wean resident as  appropriate.  Ok Edwards NP [facility]  Contact [phone] Monday through Friday 8am- 5pm  After hours call [phone]

## 2020-10-14 DIAGNOSIS — Z515 Encounter for palliative care: Secondary | ICD-10-CM | POA: Insufficient documentation

## 2020-10-14 DIAGNOSIS — I700 Atherosclerosis of aorta: Secondary | ICD-10-CM | POA: Insufficient documentation

## 2020-10-16 ENCOUNTER — Encounter: Payer: Self-pay | Admitting: Internal Medicine

## 2020-10-16 ENCOUNTER — Non-Acute Institutional Stay (SKILLED_NURSING_FACILITY): Admitting: Internal Medicine

## 2020-10-16 DIAGNOSIS — E46 Unspecified protein-calorie malnutrition: Secondary | ICD-10-CM | POA: Insufficient documentation

## 2020-10-16 DIAGNOSIS — N179 Acute kidney failure, unspecified: Secondary | ICD-10-CM

## 2020-10-16 DIAGNOSIS — E440 Moderate protein-calorie malnutrition: Secondary | ICD-10-CM | POA: Diagnosis not present

## 2020-10-16 DIAGNOSIS — Z66 Do not resuscitate: Secondary | ICD-10-CM

## 2020-10-16 DIAGNOSIS — R6521 Severe sepsis with septic shock: Secondary | ICD-10-CM

## 2020-10-16 DIAGNOSIS — I4891 Unspecified atrial fibrillation: Secondary | ICD-10-CM

## 2020-10-16 DIAGNOSIS — A419 Sepsis, unspecified organism: Secondary | ICD-10-CM | POA: Diagnosis not present

## 2020-10-16 NOTE — Patient Instructions (Signed)
See assessment and plan under each diagnosis in the problem list and acutely for this visit 

## 2020-10-16 NOTE — Assessment & Plan Note (Signed)
Above complications of sepsis resolved with intensive fluid resuscitation and course of antibiotics for healthcare associated pneumonia. Poor long-term prognosis resulted in DNR/Hospice status

## 2020-10-16 NOTE — Progress Notes (Signed)
NURSING HOME LOCATION:  [facility] ROOM NUMBER:    CODE STATUS:  DNR  PCP:  Ok Edwards, NP  This is a nursing facility follow up for [facility] readmission within 30 days  Interim medical record and care since last [name] visit was updated with review of diagnostic studies and change in clinical status since last visit were documented.  HPI: Patient was rehospitalized 12/3-12/28/2021, admitted from the SNF with adult failure to thrive manifested as anorexia with poor liquid intake as well for several days prior to admission.  This was associated with hypotension; labs revealed severe hypernatremia, hyperkalemia and AKI. Temperature was 100.8 on arrival with a pulse of 147 and blood pressure of 78/61.  Sodium was 161, potassium 6.4, BUN 105 and creatinine 2.16.  Estimated GFR was 29.  BNP was 726.  He was hemoconcentrated with a hemoglobin/hematocrit of 17.4/59.4.  Chest x-Yossef revealed persistent right-sided atelectasis and cardiomegaly.  Clinically he was diagnosed with septic shock possibly related to healthcare associated pneumonia.  His lactic acid peaked at 3.8. Dr. Melvyn Novas, PCCM discussed his critical status and poor prognosis with the wife who elected a trial of fluids and antibiotics to see if there were any chance of meaningful recovery.  He was to remain DNR and no pressors or central line were initiated. He received 6 days of cefepime with improvement in symptoms and almost total resolution of the sepsis features. Hypernatremia improved; final sodium level was 142.  His chronic diastolic congestive heart failure was felt to be compensated.  Daily weights and strict INO's were monitored in the hospital. Creatinine peaked at 2.16; with fluid resuscitation the last creatinine was 0.81.  Lokelma was initiated for hyperkalemia with resolution.  Midodrine was continued 3 times daily for hypotension. Palliative Care consulted and symptomatic  management and comfort care were implemented.  There were to be no future hospitalizations. Hospice follow-up at the facility was planned. He was discharged back to the facility on dysphagia 2 diet with nectar thick liquids because of high risk of aspiration.  Other medical conditions include dyslipidemia and dementia for which he was on Aricept.  Review of systems: Dementia invalidated responses. Initially he answered a monosyllabic "no" to all questions including whether he was short of breath or not. He did validate pain in the left knee when it was passively extended. He also noted that he had "irritation" in the scrotal area when it was discussed that the scrotum was swollen.  Physical exam:  Pertinent or positive findings: He appears chronically ill. He has a full head of white hair and full beard and mustache giving him the appearance of a [employer]. He is hard of hearing. Ptosis is present on the right greater than the left. He is wearing nasal oxygen. He is completely edentulous. Breath sounds are markedly decreased posteriorly. Anteriorly he exhibits some diffuse rales. Heart sounds essentially cannot be auscultated. Abdomen is protuberant. Foley catheter is present. The scrotum is swollen w/o clinical cellulitis.Pedal pulses not palpable. The lower extremities reveal flexion of the knees. There is almost a contracture on the left. The legs can be extended but quickly returned to partial flexion. He has diffuse pitting edema of the trunk and limbs. This is most pronounced in the right antecubital area. He has some blistering and eschar at the lateral aspect of the left knee.  General appearance: no acute distress, increased work of breathing is present.   Lymphatic: No lymphadenopathy about the  head, neck, axilla. Eyes: No conjunctival inflammation is present. There is no scleral icterus. Ears:  External ear exam shows no significant lesions or deformities.   Nose:  External nasal  examination shows no deformity or inflammation. Nasal mucosa are pink and moist without lesions, exudates Oral exam: There is no oropharyngeal erythema or exudate. Neck:  No thyromegaly, masses, tenderness noted.   .  Lungs:  without wheezes, rhonchi, rubs. Abdomen: Bowel sounds are normal. Abdomen is soft and nontender with no organomegaly, hernias, masses. Extremities:  No cyanosis, clubbing  Neurologic exam :Balance, Rhomberg, finger to nose testing could not be completed due to clinical state Skin: Warm & dry w/o tenting. No significant rash.  See summary under each active problem in the Problem List with associated updated therapeutic plan

## 2020-10-16 NOTE — Assessment & Plan Note (Signed)
Creatinine peaked at 2.16 but with fluid resuscitation creatinine returned to 0.81.  Nephrotoxic drugs will be avoided.

## 2020-10-16 NOTE — Assessment & Plan Note (Addendum)
Albumin is 2.2 and total protein 5.1; this is likely a contributing factor to the diffuse third spacing present on exam with soft tissue pitting to palpation. Nutrition to follow @ SNF.

## 2020-10-16 NOTE — Assessment & Plan Note (Addendum)
Cardiac auscultation compromised by habitus. Pulse remains irregular but rate may reflect pulse deficit with AF.

## 2020-10-31 DEATH — deceased

## 2021-02-17 IMAGING — DX DG CHEST 2V
2 series · 2 of 2 positions shown · non-contrast
Comparison: 08/10/2020

CLINICAL DATA: Follow-up pleural effusion

EXAM:
CHEST - 2 VIEW

[chest lat]
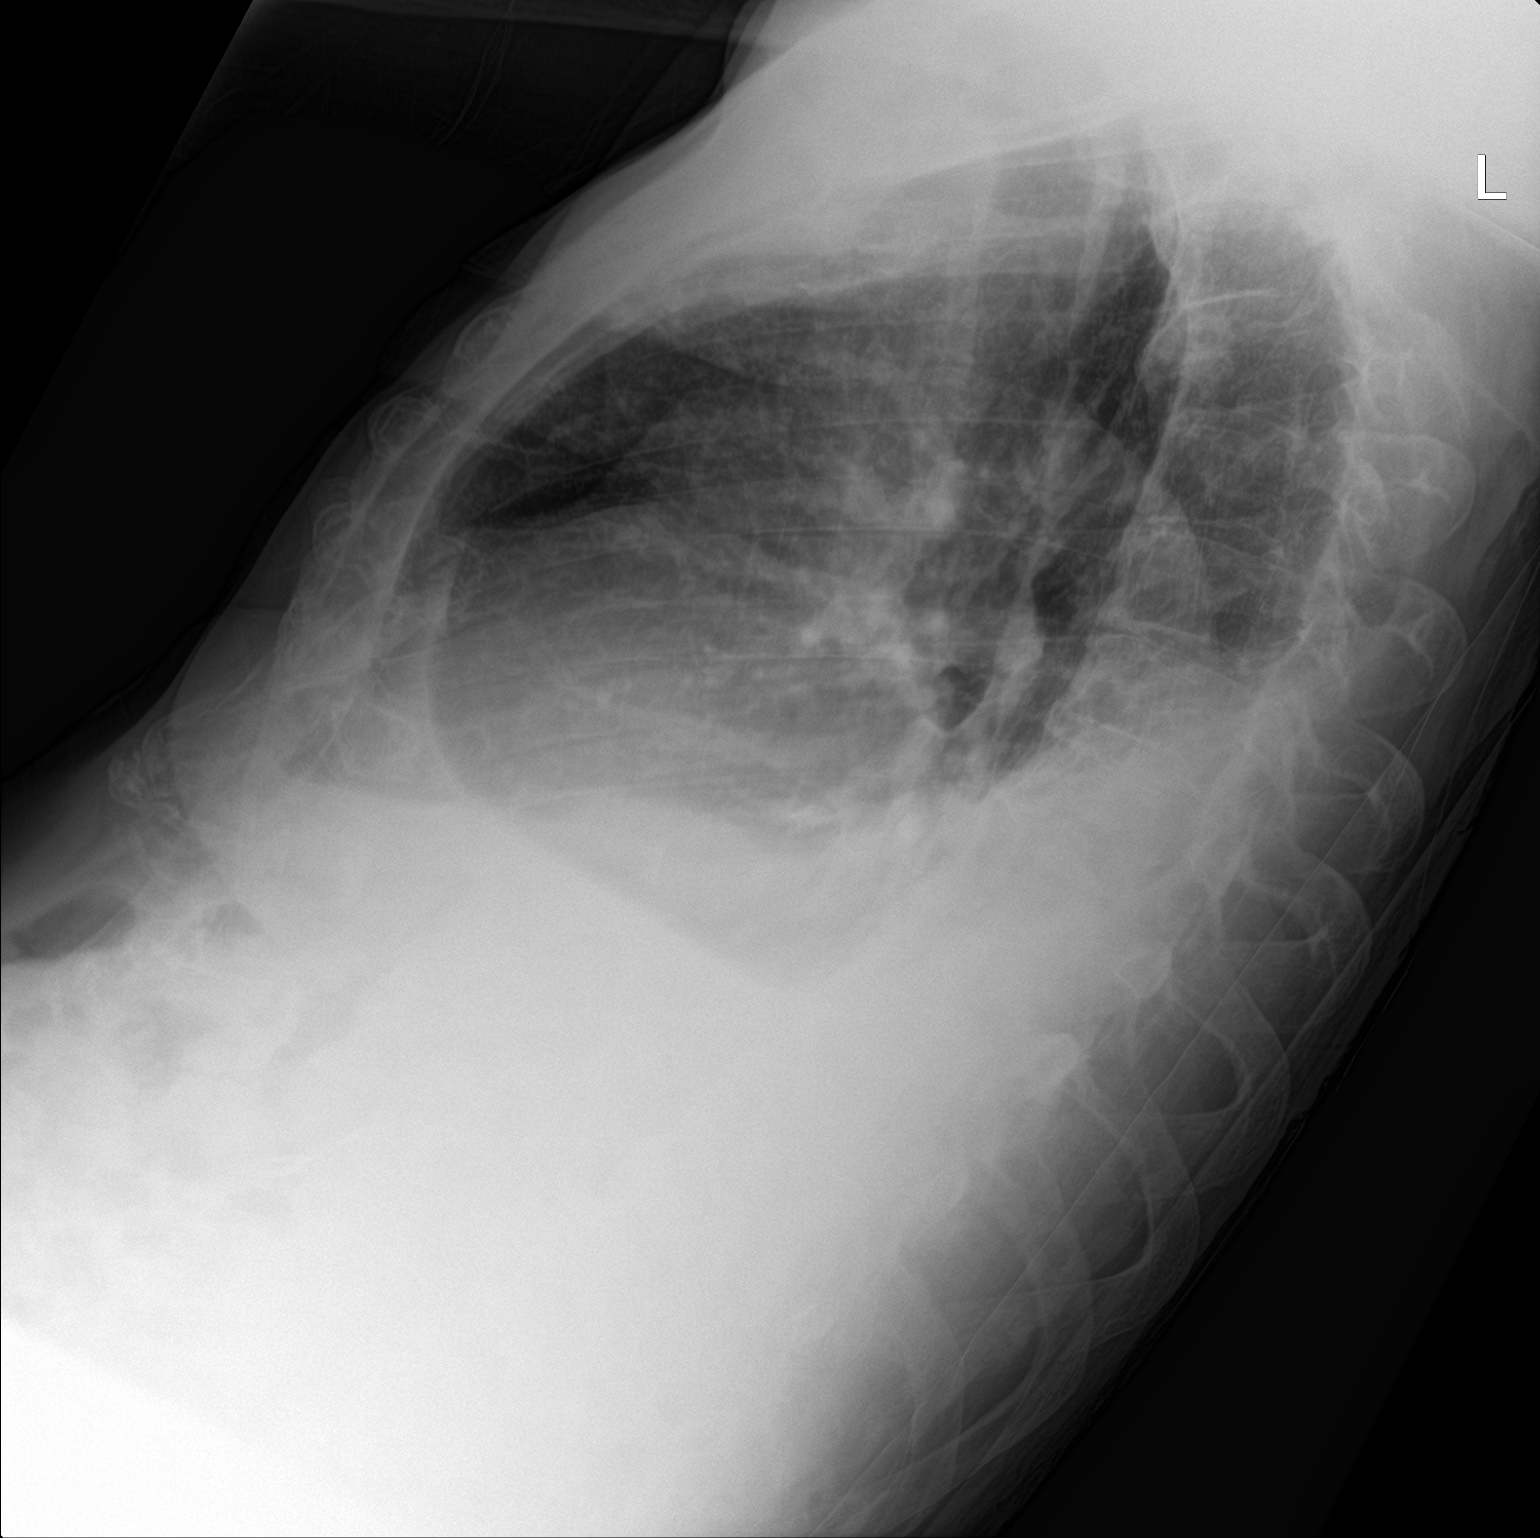

[chest ap]
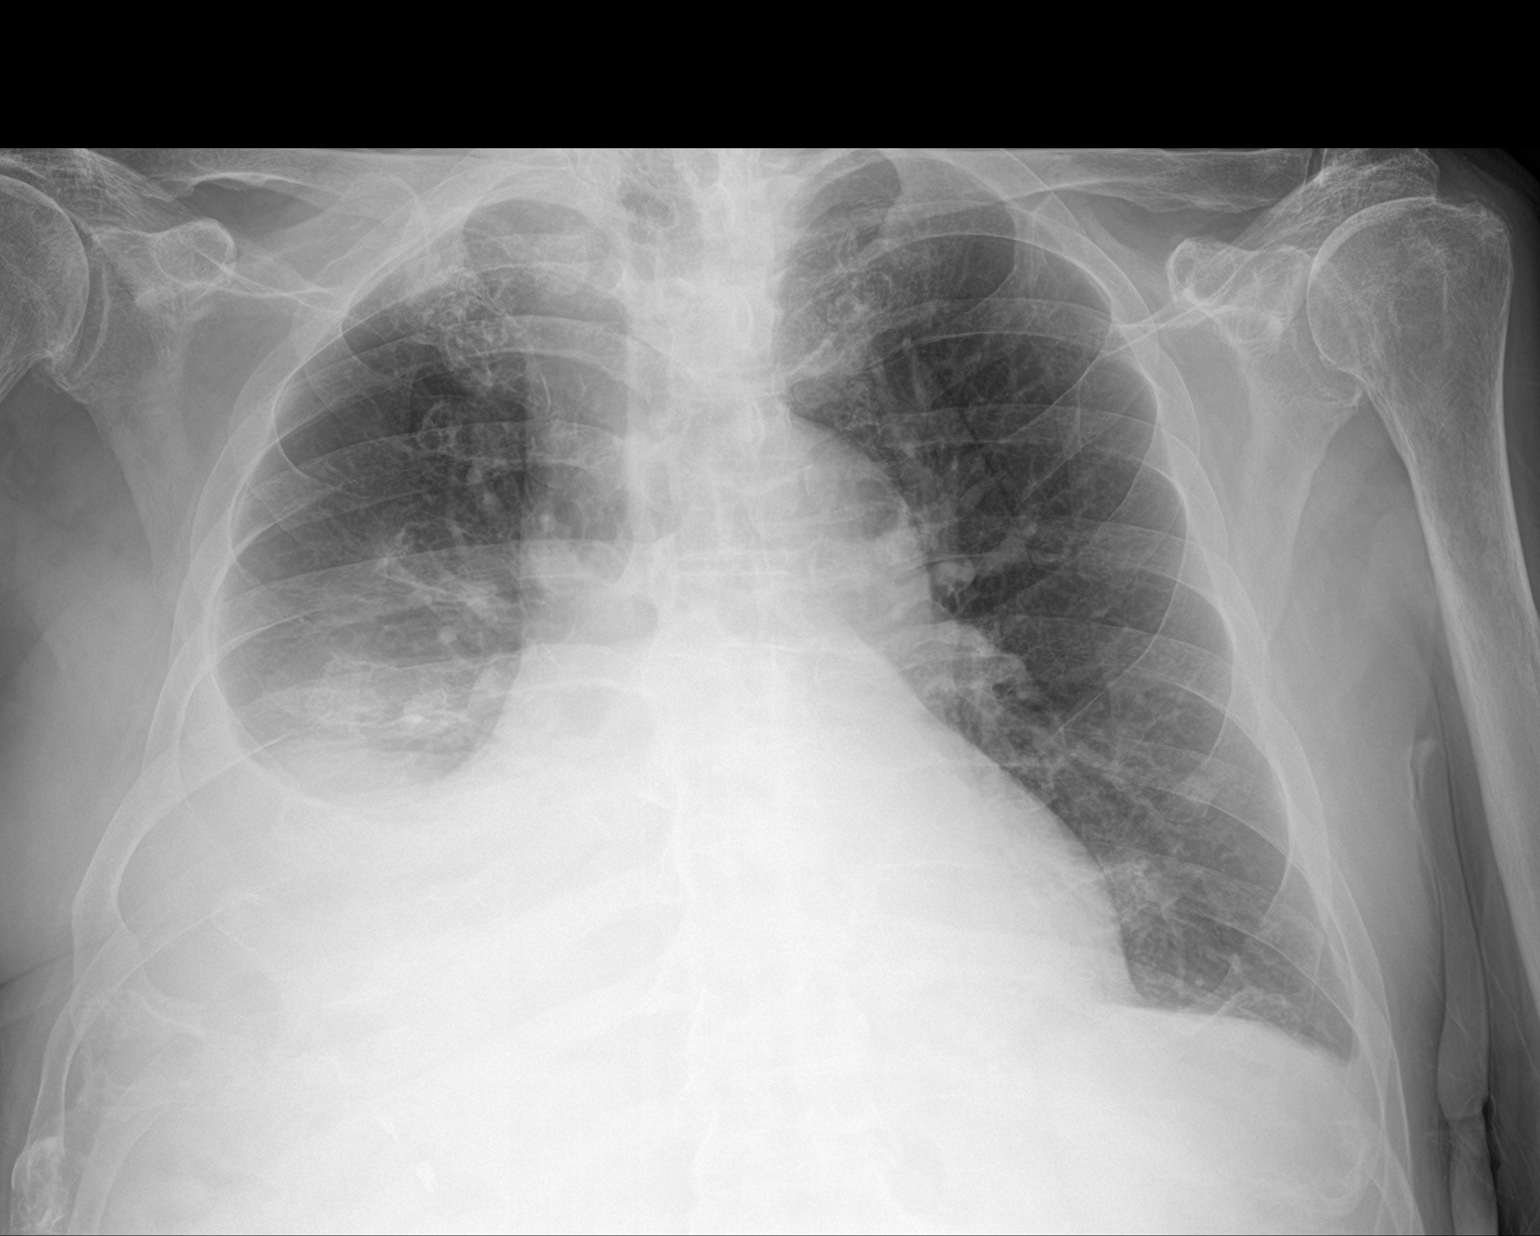

[2 of 2 positions shown; findings below may reference images not displayed]

FINDINGS: Cardiac shadow is stable. Left lung is well aerated without focal
infiltrate. Small left pleural effusion is noted posteriorly. Large
right-sided pleural effusion is noted increased when compared with
the prior exam. Underlying infiltrate is likely present. No bony
abnormality is noted.
IMPRESSION: Bilateral pleural effusions right greater than left increased from
the prior exam.

## 2021-02-23 IMAGING — DX DG CHEST 1V PORT
1 series · 1 of 1 positions shown · non-contrast
Comparison: Six days ago

CLINICAL DATA: Shortness of breath

EXAM:
PORTABLE CHEST 1 VIEW

[chest ap]
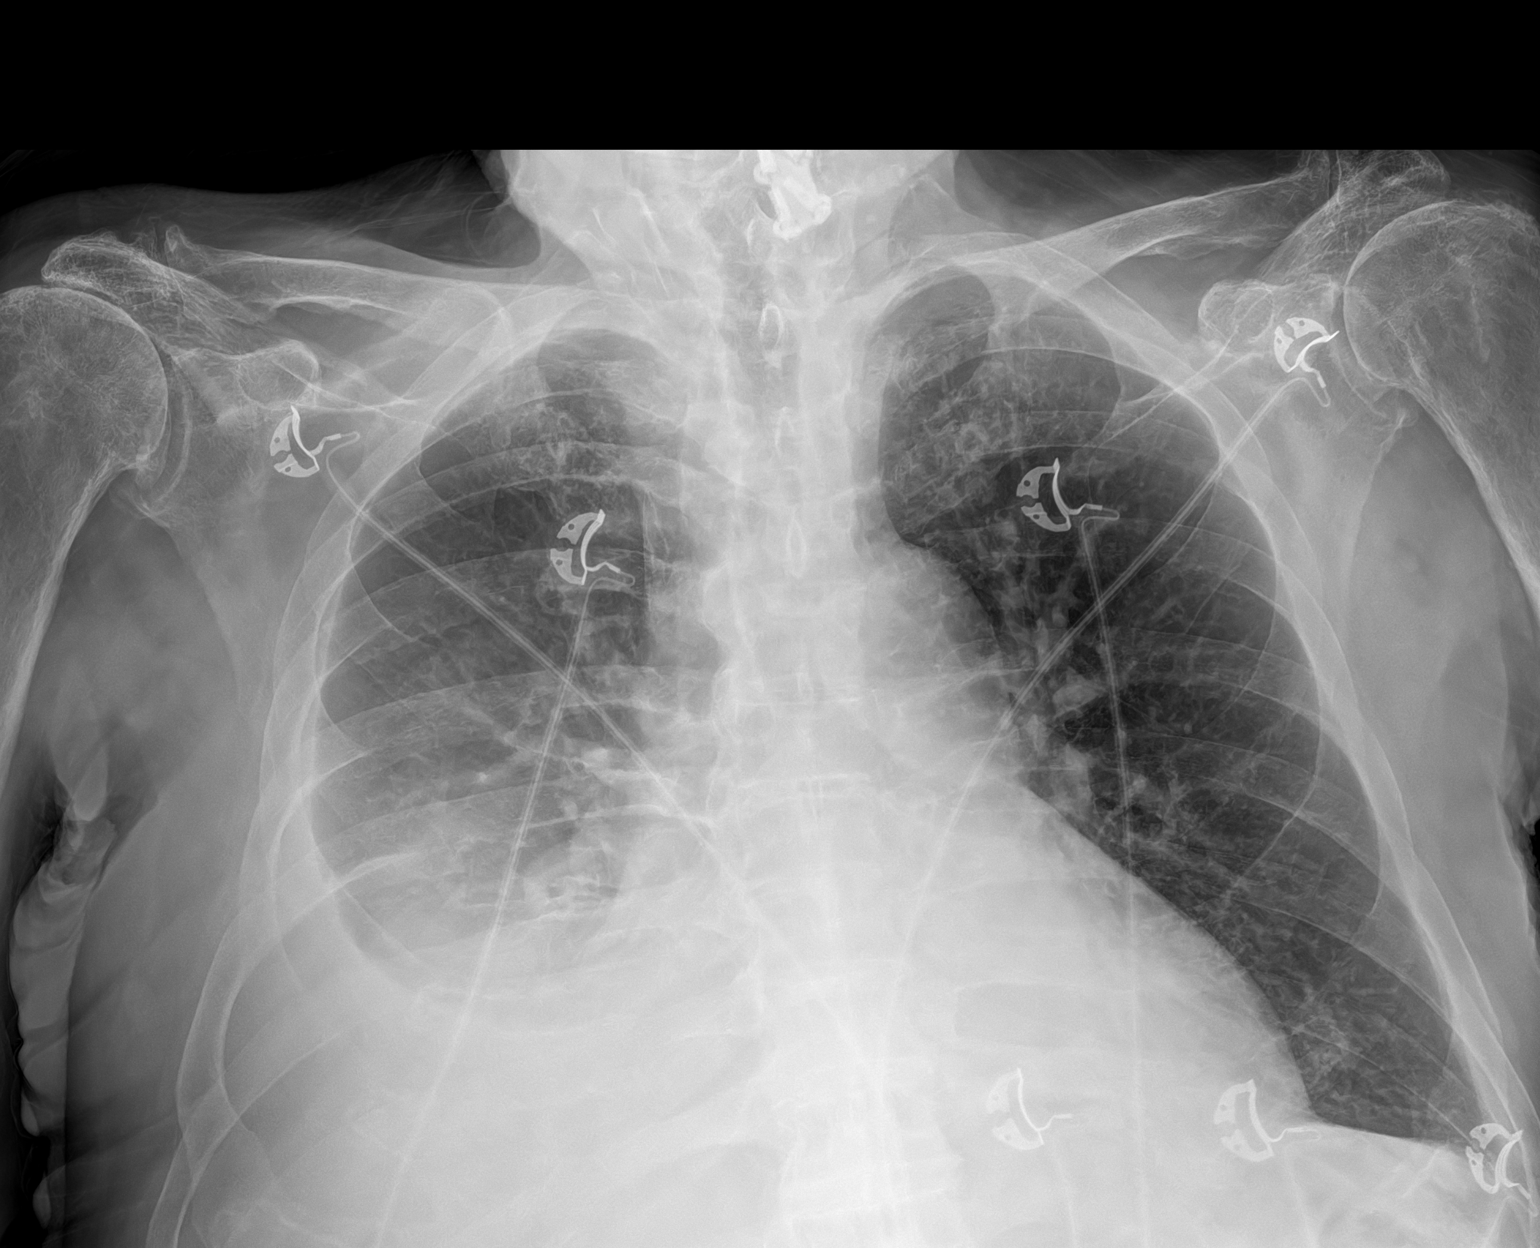

[1 of 1 positions shown; findings below may reference images not displayed]

FINDINGS: Moderate right pleural effusion with obscured or opacified right
lower lobe. Increased retrocardiac density which is also unchanged.
There is likely residual trace left pleural fluid when compared with
prior and when allowing for obscuration by EKG lead. No
pneumothorax. Cardiopericardial enlargement.

Unchanged appearance of ACDF hardware.
IMPRESSION: 1. Stable compared to 6 days ago.
2. Moderate right pleural effusion.

## 2021-03-30 ENCOUNTER — Ambulatory Visit: Payer: Medicare Other | Admitting: Cardiology

## 2021-04-01 IMAGING — DX DG CHEST 2V
3 series · 3 of 3 positions shown · non-contrast
Comparison: 08/27/2020.  08/26/2020.

CLINICAL DATA: Pleural effusion.

EXAM:
CHEST - 2 VIEW

[chest lat]
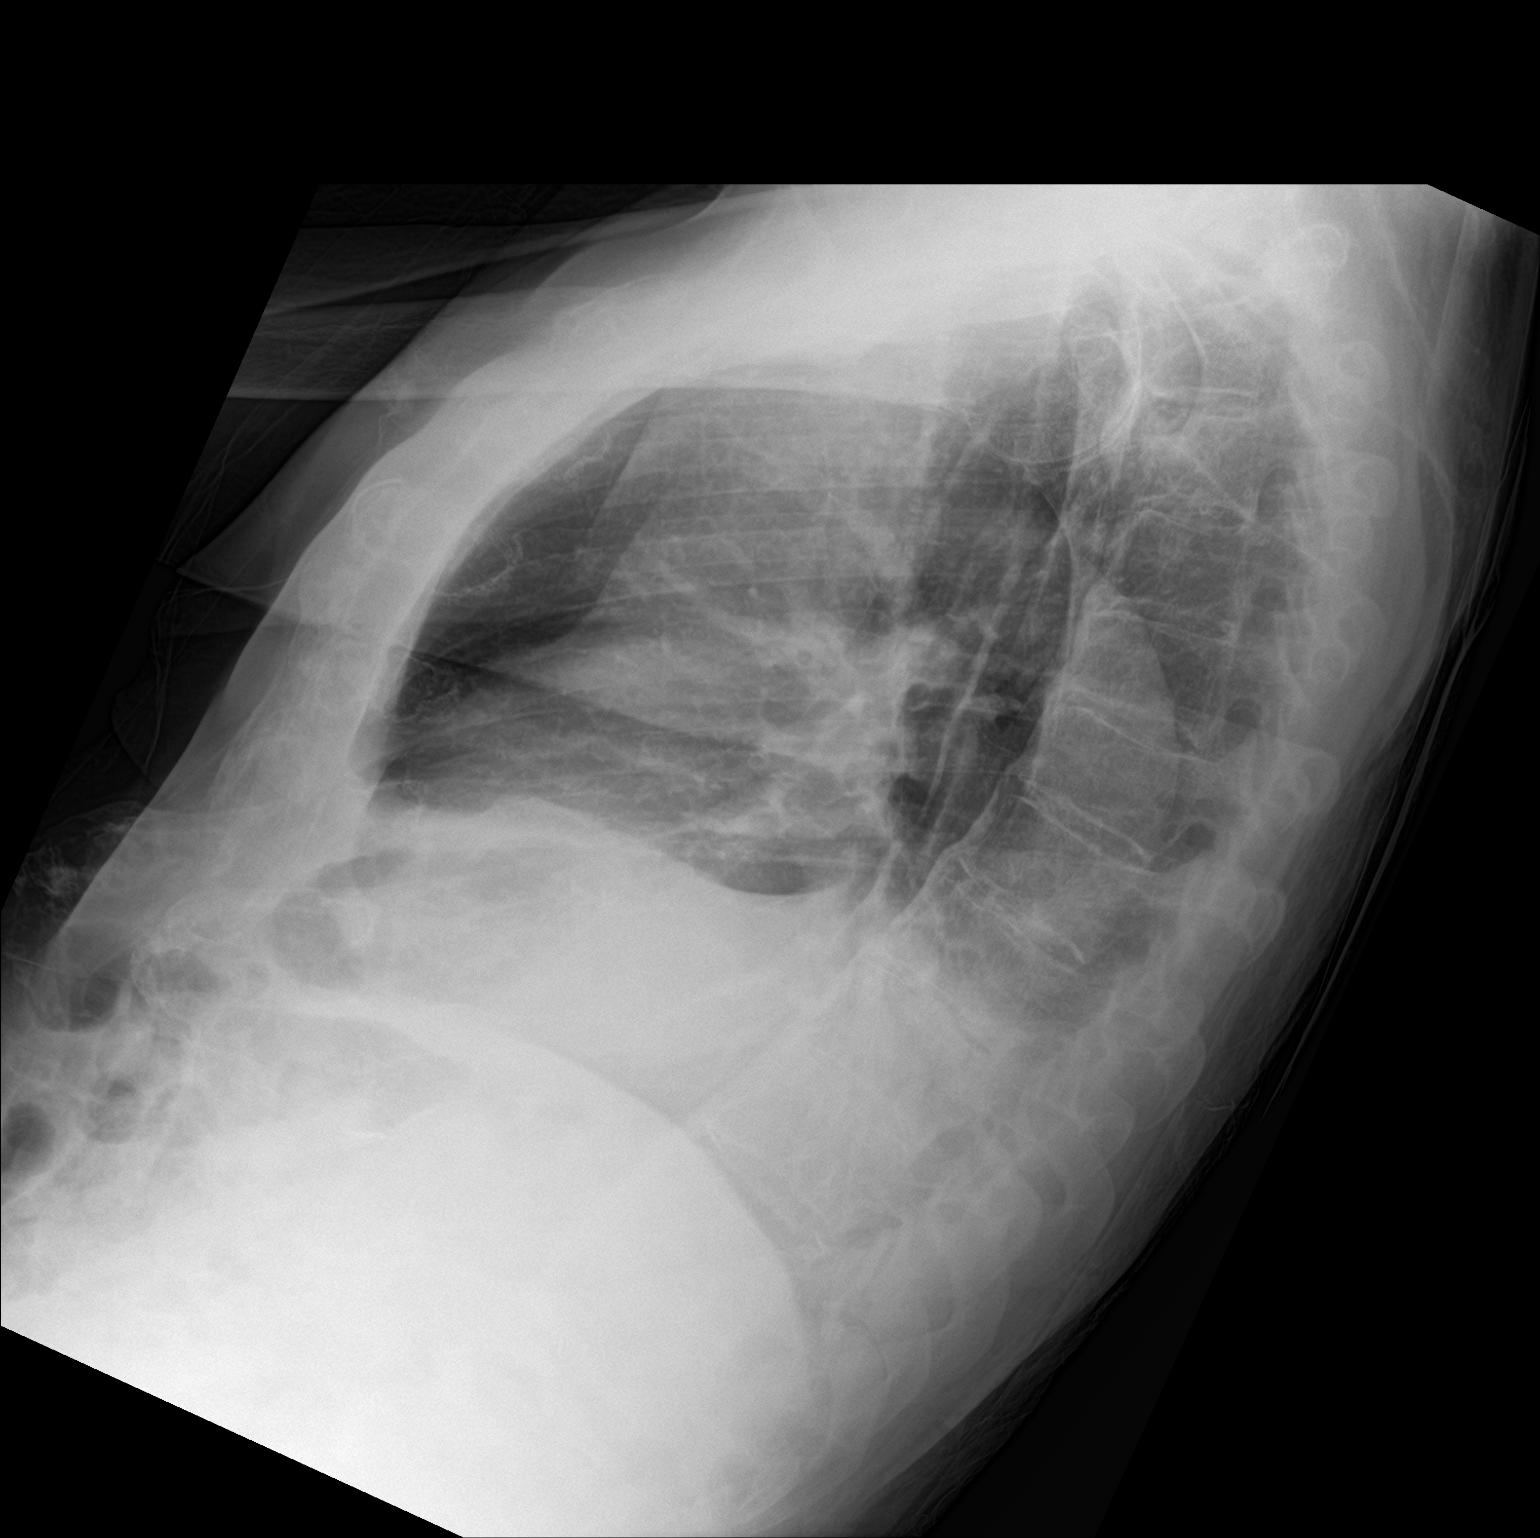

[chest ap (1 of 2)]
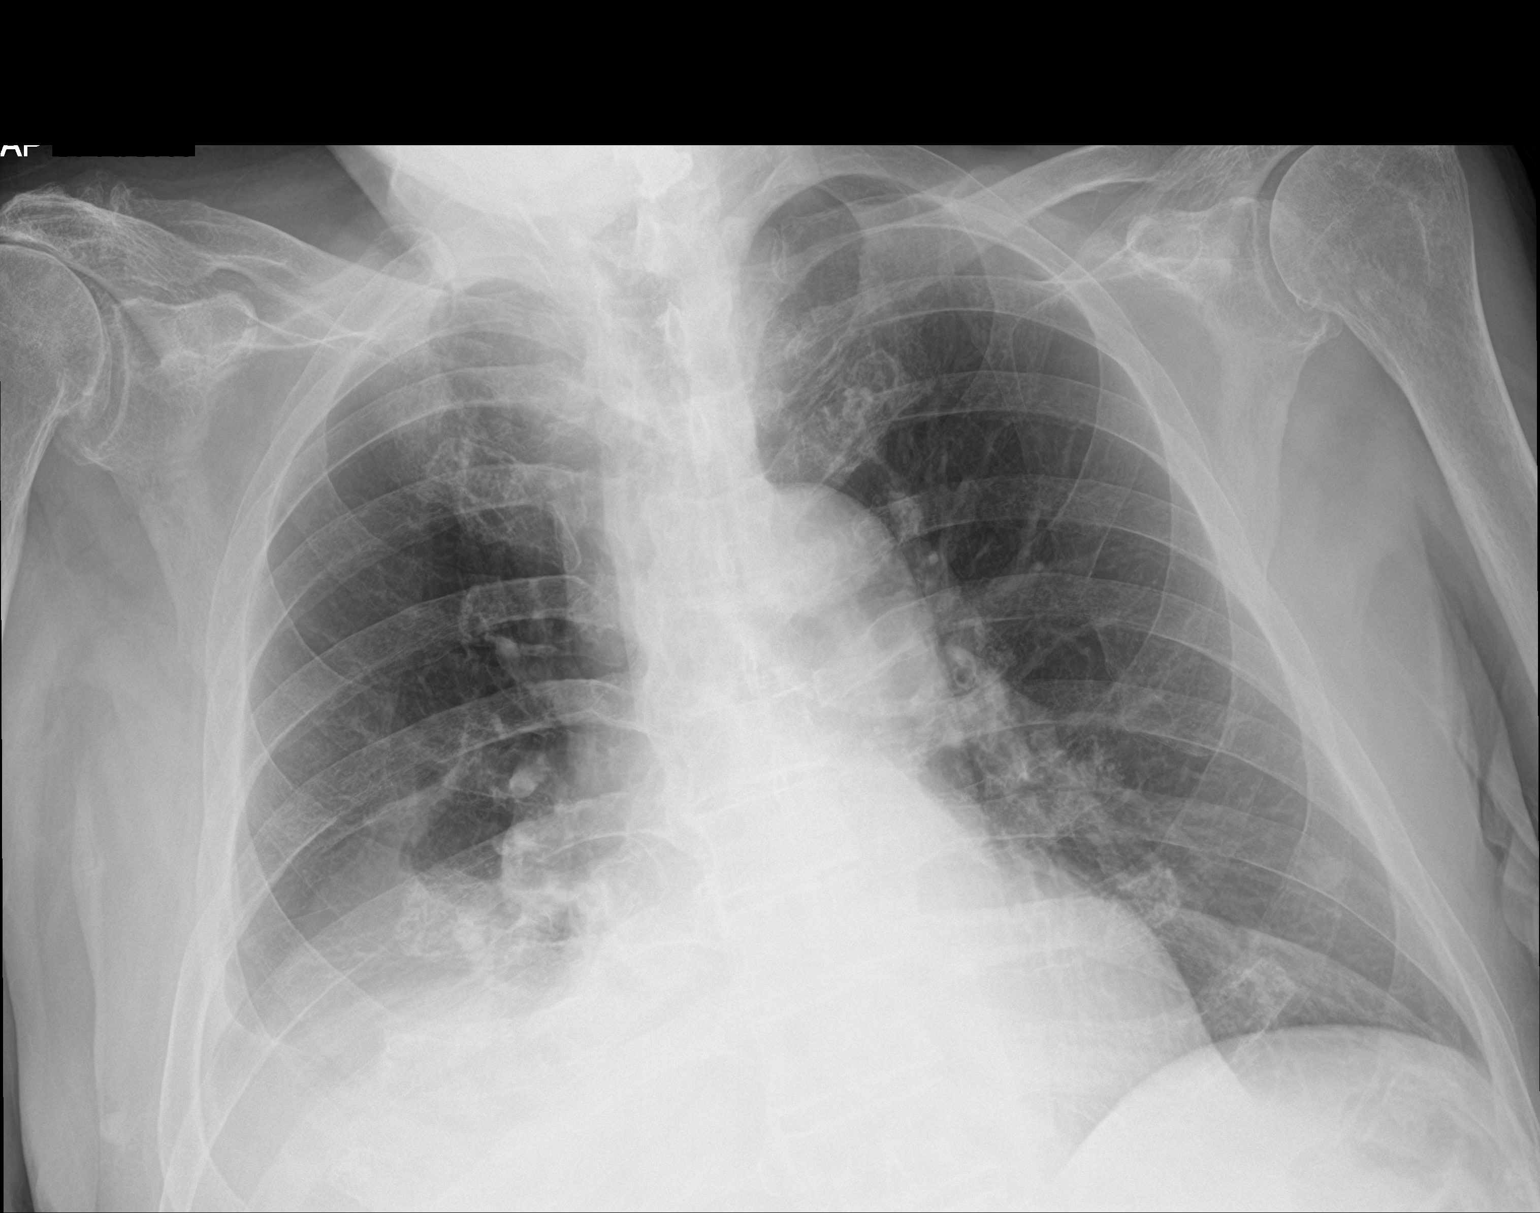

[chest ap (2 of 2)]
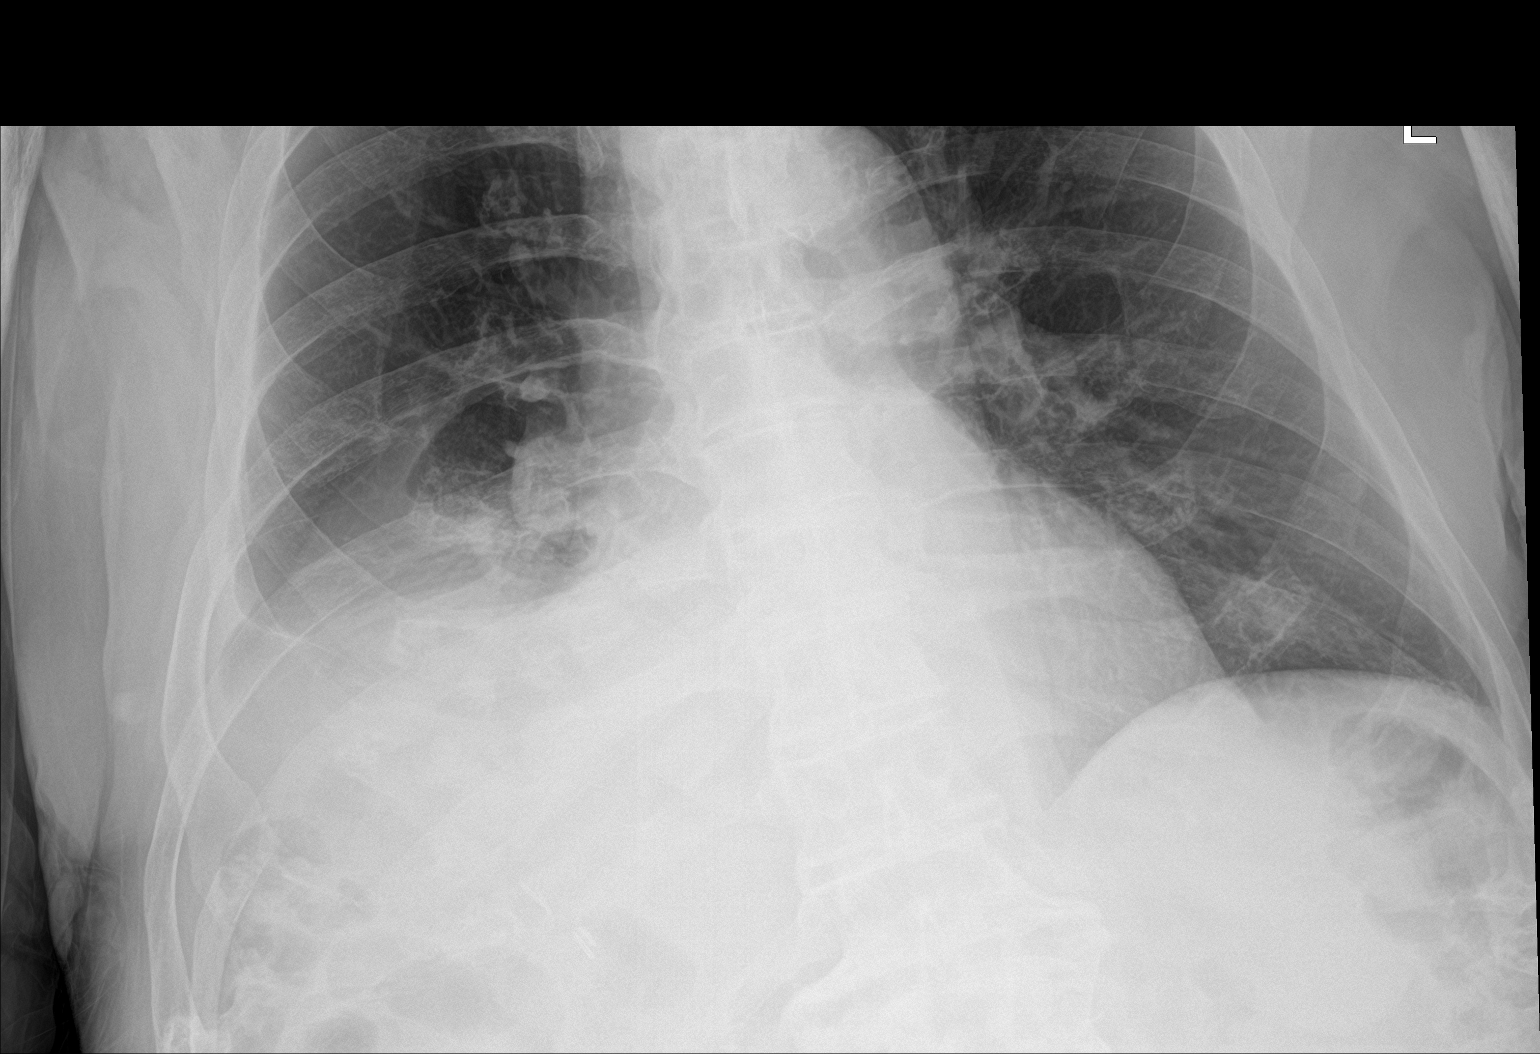

[3 of 3 positions shown; findings below may reference images not displayed]

FINDINGS: Cardiomegaly. No pulmonary venous congestion. Persistent right base
atelectasis/infiltrate and right-sided pleural effusion. No interim
change. Nodular opacity noted over the left lung base most likely
nipple shadow as it was not present on prior recent studies.
Symmetric nipple opacity noted over the right chest wall.
Degenerative changes lumbar spine and both shoulders.
IMPRESSION: 1. Cardiomegaly. No pulmonary venous congestion.
2. Persistent right base atelectasis/infiltrate and right-sided
pleural effusion. No interim change. No pneumothorax.
